# Patient Record
Sex: Female | Born: 1965 | Race: Black or African American | Hispanic: No | Marital: Single | Smoking: Current every day smoker
Health system: Southern US, Community
[De-identification: ages and names within clinical notes are randomized; demographics above are authoritative.]

## PROBLEM LIST (undated history)

## (undated) DIAGNOSIS — N390 Urinary tract infection, site not specified: Secondary | ICD-10-CM

## (undated) DIAGNOSIS — G8929 Other chronic pain: Secondary | ICD-10-CM

## (undated) DIAGNOSIS — J189 Pneumonia, unspecified organism: Secondary | ICD-10-CM

## (undated) DIAGNOSIS — J40 Bronchitis, not specified as acute or chronic: Secondary | ICD-10-CM

## (undated) DIAGNOSIS — M549 Dorsalgia, unspecified: Secondary | ICD-10-CM

## (undated) DIAGNOSIS — K219 Gastro-esophageal reflux disease without esophagitis: Secondary | ICD-10-CM

## (undated) DIAGNOSIS — I10 Essential (primary) hypertension: Secondary | ICD-10-CM

## (undated) DIAGNOSIS — M1990 Unspecified osteoarthritis, unspecified site: Secondary | ICD-10-CM

## (undated) DIAGNOSIS — E079 Disorder of thyroid, unspecified: Secondary | ICD-10-CM

## (undated) DIAGNOSIS — S82892A Other fracture of left lower leg, initial encounter for closed fracture: Secondary | ICD-10-CM

## (undated) DIAGNOSIS — M419 Scoliosis, unspecified: Secondary | ICD-10-CM

## (undated) DIAGNOSIS — F4310 Post-traumatic stress disorder, unspecified: Secondary | ICD-10-CM

## (undated) DIAGNOSIS — R569 Unspecified convulsions: Secondary | ICD-10-CM

## (undated) DIAGNOSIS — R7303 Prediabetes: Secondary | ICD-10-CM

## (undated) DIAGNOSIS — IMO0001 Reserved for inherently not codable concepts without codable children: Secondary | ICD-10-CM

## (undated) DIAGNOSIS — F3130 Bipolar disorder, current episode depressed, mild or moderate severity, unspecified: Secondary | ICD-10-CM

## (undated) DIAGNOSIS — F1910 Other psychoactive substance abuse, uncomplicated: Secondary | ICD-10-CM

## (undated) DIAGNOSIS — J449 Chronic obstructive pulmonary disease, unspecified: Secondary | ICD-10-CM

## (undated) DIAGNOSIS — J45909 Unspecified asthma, uncomplicated: Secondary | ICD-10-CM

## (undated) HISTORY — PX: FRACTURE SURGERY: SHX138

## (undated) HISTORY — PX: BACK SURGERY: SHX140

## (undated) HISTORY — PX: NECK SURGERY: SHX720

## (undated) HISTORY — PX: JOINT REPLACEMENT: SHX530

---

## 2011-08-13 DIAGNOSIS — Z8719 Personal history of other diseases of the digestive system: Secondary | ICD-10-CM | POA: Insufficient documentation

## 2011-08-13 DIAGNOSIS — R739 Hyperglycemia, unspecified: Secondary | ICD-10-CM | POA: Insufficient documentation

## 2011-08-13 DIAGNOSIS — F4310 Post-traumatic stress disorder, unspecified: Secondary | ICD-10-CM | POA: Insufficient documentation

## 2011-08-13 DIAGNOSIS — J45909 Unspecified asthma, uncomplicated: Secondary | ICD-10-CM | POA: Insufficient documentation

## 2012-11-12 DIAGNOSIS — S82122D Displaced fracture of lateral condyle of left tibia, subsequent encounter for closed fracture with routine healing: Secondary | ICD-10-CM | POA: Insufficient documentation

## 2013-05-02 ENCOUNTER — Emergency Department (HOSPITAL_COMMUNITY)
Admission: EM | Admit: 2013-05-02 | Discharge: 2013-05-02 | Disposition: A | Discharge status: Home / Self Care | Payer: Self-pay | Attending: Emergency Medicine | Admitting: Emergency Medicine

## 2013-05-02 ENCOUNTER — Encounter (HOSPITAL_COMMUNITY): Payer: Self-pay | Admitting: Emergency Medicine

## 2013-05-02 DIAGNOSIS — Y9389 Activity, other specified: Secondary | ICD-10-CM | POA: Insufficient documentation

## 2013-05-02 DIAGNOSIS — S4980XA Other specified injuries of shoulder and upper arm, unspecified arm, initial encounter: Secondary | ICD-10-CM | POA: Insufficient documentation

## 2013-05-02 DIAGNOSIS — F17200 Nicotine dependence, unspecified, uncomplicated: Secondary | ICD-10-CM | POA: Insufficient documentation

## 2013-05-02 DIAGNOSIS — Z9889 Other specified postprocedural states: Secondary | ICD-10-CM | POA: Insufficient documentation

## 2013-05-02 DIAGNOSIS — I10 Essential (primary) hypertension: Secondary | ICD-10-CM | POA: Insufficient documentation

## 2013-05-02 DIAGNOSIS — K219 Gastro-esophageal reflux disease without esophagitis: Secondary | ICD-10-CM | POA: Insufficient documentation

## 2013-05-02 DIAGNOSIS — G8929 Other chronic pain: Secondary | ICD-10-CM | POA: Insufficient documentation

## 2013-05-02 DIAGNOSIS — R109 Unspecified abdominal pain: Secondary | ICD-10-CM | POA: Insufficient documentation

## 2013-05-02 DIAGNOSIS — Z8742 Personal history of other diseases of the female genital tract: Secondary | ICD-10-CM | POA: Insufficient documentation

## 2013-05-02 DIAGNOSIS — R296 Repeated falls: Secondary | ICD-10-CM | POA: Insufficient documentation

## 2013-05-02 DIAGNOSIS — J45909 Unspecified asthma, uncomplicated: Secondary | ICD-10-CM | POA: Insufficient documentation

## 2013-05-02 DIAGNOSIS — Z8639 Personal history of other endocrine, nutritional and metabolic disease: Secondary | ICD-10-CM | POA: Insufficient documentation

## 2013-05-02 DIAGNOSIS — Z79899 Other long term (current) drug therapy: Secondary | ICD-10-CM | POA: Insufficient documentation

## 2013-05-02 DIAGNOSIS — X503XXA Overexertion from repetitive movements, initial encounter: Secondary | ICD-10-CM | POA: Insufficient documentation

## 2013-05-02 DIAGNOSIS — S46909A Unspecified injury of unspecified muscle, fascia and tendon at shoulder and upper arm level, unspecified arm, initial encounter: Secondary | ICD-10-CM | POA: Insufficient documentation

## 2013-05-02 DIAGNOSIS — F4310 Post-traumatic stress disorder, unspecified: Secondary | ICD-10-CM | POA: Insufficient documentation

## 2013-05-02 DIAGNOSIS — Y929 Unspecified place or not applicable: Secondary | ICD-10-CM | POA: Insufficient documentation

## 2013-05-02 DIAGNOSIS — Z862 Personal history of diseases of the blood and blood-forming organs and certain disorders involving the immune mechanism: Secondary | ICD-10-CM | POA: Insufficient documentation

## 2013-05-02 DIAGNOSIS — M25579 Pain in unspecified ankle and joints of unspecified foot: Secondary | ICD-10-CM | POA: Insufficient documentation

## 2013-05-02 DIAGNOSIS — F3130 Bipolar disorder, current episode depressed, mild or moderate severity, unspecified: Secondary | ICD-10-CM | POA: Insufficient documentation

## 2013-05-02 DIAGNOSIS — S335XXA Sprain of ligaments of lumbar spine, initial encounter: Secondary | ICD-10-CM | POA: Insufficient documentation

## 2013-05-02 DIAGNOSIS — G8918 Other acute postprocedural pain: Secondary | ICD-10-CM | POA: Insufficient documentation

## 2013-05-02 DIAGNOSIS — S39012A Strain of muscle, fascia and tendon of lower back, initial encounter: Secondary | ICD-10-CM

## 2013-05-02 HISTORY — DX: Bronchitis, not specified as acute or chronic: J40

## 2013-05-02 HISTORY — DX: Post-traumatic stress disorder, unspecified: F43.10

## 2013-05-02 HISTORY — DX: Disorder of thyroid, unspecified: E07.9

## 2013-05-02 HISTORY — DX: Unspecified asthma, uncomplicated: J45.909

## 2013-05-02 HISTORY — DX: Gastro-esophageal reflux disease without esophagitis: K21.9

## 2013-05-02 HISTORY — DX: Dorsalgia, unspecified: M54.9

## 2013-05-02 HISTORY — DX: Bipolar disorder, current episode depressed, mild or moderate severity, unspecified: F31.30

## 2013-05-02 HISTORY — DX: Essential (primary) hypertension: I10

## 2013-05-02 HISTORY — DX: Other chronic pain: G89.29

## 2013-05-02 LAB — URINALYSIS, ROUTINE W REFLEX MICROSCOPIC
Bilirubin Urine: NEGATIVE
Glucose, UA: NEGATIVE mg/dL
Hgb urine dipstick: NEGATIVE
Ketones, ur: 15 mg/dL — AB
Leukocytes, UA: NEGATIVE
Nitrite: NEGATIVE
Protein, ur: NEGATIVE mg/dL
Specific Gravity, Urine: 1.024 (ref 1.005–1.030)
Urobilinogen, UA: 1 mg/dL (ref 0.0–1.0)
pH: 7 (ref 5.0–8.0)

## 2013-05-02 MED ORDER — PREDNISONE 50 MG PO TABS: 50.0000 mg | ORAL_TABLET | Freq: Every day | ORAL | Status: DC

## 2013-05-02 MED ORDER — KETOROLAC TROMETHAMINE 60 MG/2ML IM SOLN
60.0000 mg | Freq: Once | INTRAMUSCULAR | Status: AC
  Administered 2013-05-02: 60 mg via INTRAMUSCULAR
  Filled 2013-05-02: qty 2

## 2013-05-02 MED ORDER — MORPHINE SULFATE 4 MG/ML IJ SOLN
6.0000 mg | Freq: Once | INTRAMUSCULAR | Status: AC
  Administered 2013-05-02: 6 mg via INTRAMUSCULAR
  Filled 2013-05-02: qty 2

## 2013-05-02 MED ORDER — HYDROCODONE-ACETAMINOPHEN 5-325 MG PO TABS: 1.0000 | ORAL_TABLET | Freq: Four times a day (QID) | ORAL | Status: DC | PRN

## 2013-05-02 MED ORDER — DEXAMETHASONE SODIUM PHOSPHATE 10 MG/ML IJ SOLN
10.0000 mg | Freq: Once | INTRAMUSCULAR | Status: AC
  Administered 2013-05-02: 10 mg via INTRAMUSCULAR
  Filled 2013-05-02: qty 1

## 2013-05-02 MED ORDER — CYCLOBENZAPRINE HCL 10 MG PO TABS: 10.0000 mg | ORAL_TABLET | Freq: Three times a day (TID) | ORAL | Status: DC | PRN

## 2013-05-02 NOTE — ED Notes (Signed)
Pt reports 2 weeks of multiple complaints: Low abdominal pain, low back pain, shoulder pain, left ankle pain and left knee pain. Pt fractured her left ankle Oct 2013 and has a stimulator. Pt fell twice today. Pt also reports shortness of breath and wheezing. Pt able to talk in complete sentences without difficulty.

## 2013-05-02 NOTE — ED Provider Notes (Signed)
History    CSN: [id number] Arrival date & time 05/02/13  1133  First MD Initiated Contact with Patient 05/02/13 1237     Chief Complaint  Patient presents with  . Back Pain  . Ankle Pain  . Fall  . Abdominal Pain  . Shoulder Pain   (Consider location/radiation/quality/duration/timing/severity/associated sxs/prior Treatment) HPI Patient presents to the emergency department with lower back pain.  The patient also had complaints of tissues of left ankle pain from her previous surgery and wanting her fibroids checked. The patient states that she was lifting some items and then her lower back began to hurt. The patient denies chest pain, SOB, weakness, numbness,  Headache, blurred vision, diarrhea, dysuria, fever, cough, abdominal pain, or syncope.  Patient, states she did not take anything prior to arrival for her symptoms.  Patient, states, that movement makes her pain, worse. Past Medical History  Diagnosis Date  . Hypertension   . Asthma   . Bronchitis   . GERD (gastroesophageal reflux disease)   . Thyroid disease   . PTSD (post-traumatic stress disorder)   . Bipolar affect, depressed   . Chronic back pain    Past Surgical History  Procedure Laterality Date  . Implant of bone stimulator    . Neck surgery    . Abdominal surgery     No family history on file. History  Substance Use Topics  . Smoking status: Current Every Day Smoker  . Smokeless tobacco: Not on file  . Alcohol Use: No   OB History   Grav Para Term Preterm Abortions TAB SAB Ect Mult Living                 Review of Systems All other systems negative except as documented in the HPI. All pertinent positives and negatives as reviewed in the HPI. Allergies  Crab  Home Medications   Current Outpatient Rx  Name  Route  Sig  Dispense  Refill  . albuterol (PROVENTIL HFA;VENTOLIN HFA) 108 (90 BASE) MCG/ACT inhaler   Inhalation   Inhale 2 puffs into the lungs every 6 (six) hours as needed for wheezing.        Marland Kitchen albuterol (PROVENTIL) (2.5 MG/3ML) 0.083% nebulizer solution   Nebulization   Take 2.5 mg by nebulization every 6 (six) hours as needed for wheezing.         . fluticasone (FLONASE) 50 MCG/ACT nasal spray   Nasal   Place 2 sprays into the nose daily.         . Fluticasone-Salmeterol (ADVAIR) 250-50 MCG/DOSE AEPB   Inhalation   Inhale 1 puff into the lungs every 12 (twelve) hours.         . hydrOXYzine (VISTARIL) 25 MG capsule   Oral   Take 25 mg by mouth 3 (three) times daily as needed for itching.         Marland Kitchen OVER THE COUNTER MEDICATION   Oral   Take 1 tablet by mouth daily.         Marland Kitchen PARoxetine (PAXIL) 20 MG tablet   Oral   Take 20 mg by mouth every morning.         Marland Kitchen QUEtiapine Fumarate (SEROQUEL XR) 150 MG 24 hr tablet   Oral   Take 150 mg by mouth at bedtime.         . ranitidine (ZANTAC) 150 MG tablet   Oral   Take 150 mg by mouth 2 (two) times daily.         Marland Kitchen  tiotropium (SPIRIVA) 18 MCG inhalation capsule   Inhalation   Place 18 mcg into inhaler and inhale daily.          BP 136/80  Pulse 110  Temp(Src) 98.6 F (37 C) (Oral)  Resp 22  SpO2 95%  LMP 04/25/2013 Physical Exam  Nursing note and vitals reviewed. Constitutional: She is oriented to person, place, and time. She appears well-developed and well-nourished. No distress.  HENT:  Head: Normocephalic and atraumatic.  Mouth/Throat: Oropharynx is clear and moist.  Eyes: Pupils are equal, round, and reactive to light.  Neck: Normal range of motion. Neck supple.  Cardiovascular: Normal rate, regular rhythm and normal heart sounds.   Pulmonary/Chest: Effort normal and breath sounds normal.  Abdominal: Soft. Bowel sounds are normal. She exhibits no distension. There is no tenderness.  Musculoskeletal:       Lumbar back: She exhibits tenderness and pain. She exhibits normal range of motion, no bony tenderness, no deformity and no spasm.       Back:  Neurological: She is alert  and oriented to person, place, and time. She has normal reflexes. She exhibits normal muscle tone. Coordination normal.  Skin: Skin is warm and dry.    ED Course  Procedures (including critical care time) Labs Reviewed  URINALYSIS, ROUTINE W REFLEX MICROSCOPIC - Abnormal; Notable for the following:    Ketones, ur 15 (*)    All other components within normal limits   advised the patient, that she'll need followup on her other complaints.  Patient is advised to return here as needed.  Patient has no neurological or motor deficits on exam and normal reflexes. MDM    Carlyle Dolly, PA-C 05/06/13 [phone]

## 2013-05-09 NOTE — ED Provider Notes (Signed)
Medical screening examination/treatment/procedure(s) were performed by non-physician practitioner and as supervising physician I was immediately available for consultation/collaboration.   Gwyneth Sprout, MD 05/09/13 1601

## 2013-05-12 ENCOUNTER — Encounter (HOSPITAL_COMMUNITY): Payer: Self-pay | Admitting: Nurse Practitioner

## 2013-05-12 ENCOUNTER — Emergency Department (HOSPITAL_COMMUNITY)
Admission: EM | Admit: 2013-05-12 | Discharge: 2013-05-12 | Disposition: A | Discharge status: Home / Self Care | Payer: Self-pay | Attending: Emergency Medicine | Admitting: Emergency Medicine

## 2013-05-12 ENCOUNTER — Emergency Department (HOSPITAL_COMMUNITY): Payer: Self-pay

## 2013-05-12 DIAGNOSIS — E079 Disorder of thyroid, unspecified: Secondary | ICD-10-CM | POA: Insufficient documentation

## 2013-05-12 DIAGNOSIS — I10 Essential (primary) hypertension: Secondary | ICD-10-CM | POA: Insufficient documentation

## 2013-05-12 DIAGNOSIS — J40 Bronchitis, not specified as acute or chronic: Secondary | ICD-10-CM | POA: Insufficient documentation

## 2013-05-12 DIAGNOSIS — M25569 Pain in unspecified knee: Secondary | ICD-10-CM | POA: Insufficient documentation

## 2013-05-12 DIAGNOSIS — M62838 Other muscle spasm: Secondary | ICD-10-CM | POA: Insufficient documentation

## 2013-05-12 DIAGNOSIS — Z79899 Other long term (current) drug therapy: Secondary | ICD-10-CM | POA: Insufficient documentation

## 2013-05-12 DIAGNOSIS — F17200 Nicotine dependence, unspecified, uncomplicated: Secondary | ICD-10-CM | POA: Insufficient documentation

## 2013-05-12 DIAGNOSIS — R05 Cough: Secondary | ICD-10-CM | POA: Insufficient documentation

## 2013-05-12 DIAGNOSIS — M25579 Pain in unspecified ankle and joints of unspecified foot: Secondary | ICD-10-CM | POA: Insufficient documentation

## 2013-05-12 DIAGNOSIS — F3130 Bipolar disorder, current episode depressed, mild or moderate severity, unspecified: Secondary | ICD-10-CM | POA: Insufficient documentation

## 2013-05-12 DIAGNOSIS — R059 Cough, unspecified: Secondary | ICD-10-CM | POA: Insufficient documentation

## 2013-05-12 DIAGNOSIS — G8929 Other chronic pain: Secondary | ICD-10-CM | POA: Insufficient documentation

## 2013-05-12 DIAGNOSIS — M25562 Pain in left knee: Secondary | ICD-10-CM

## 2013-05-12 DIAGNOSIS — J45909 Unspecified asthma, uncomplicated: Secondary | ICD-10-CM | POA: Insufficient documentation

## 2013-05-12 DIAGNOSIS — M545 Low back pain, unspecified: Secondary | ICD-10-CM | POA: Insufficient documentation

## 2013-05-12 DIAGNOSIS — K219 Gastro-esophageal reflux disease without esophagitis: Secondary | ICD-10-CM | POA: Insufficient documentation

## 2013-05-12 DIAGNOSIS — F4310 Post-traumatic stress disorder, unspecified: Secondary | ICD-10-CM | POA: Insufficient documentation

## 2013-05-12 LAB — BASIC METABOLIC PANEL
BUN: 10 mg/dL (ref 6–23)
CO2: 24 mEq/L (ref 19–32)
Chloride: 97 mEq/L (ref 96–112)
Creatinine, Ser: 0.91 mg/dL (ref 0.50–1.10)

## 2013-05-12 LAB — CBC
HCT: 35.1 % — ABNORMAL LOW (ref 36.0–46.0)
MCH: 29.9 pg (ref 26.0–34.0)
MCV: 87.3 fL (ref 78.0–100.0)
RBC: 4.02 MIL/uL (ref 3.87–5.11)
WBC: 11 10*3/uL — ABNORMAL HIGH (ref 4.0–10.5)

## 2013-05-12 MED ORDER — HYDROCODONE-ACETAMINOPHEN 5-325 MG PO TABS: 1.0000 | ORAL_TABLET | Freq: Four times a day (QID) | ORAL | Status: DC | PRN

## 2013-05-12 MED ORDER — GI COCKTAIL ~~LOC~~
30.0000 mL | Freq: Once | ORAL | Status: AC
  Administered 2013-05-12: 30 mL via ORAL
  Filled 2013-05-12: qty 30

## 2013-05-12 MED ORDER — METHOCARBAMOL 500 MG PO TABS: 500.0000 mg | ORAL_TABLET | Freq: Two times a day (BID) | ORAL | Status: DC

## 2013-05-12 MED ORDER — OMEPRAZOLE 20 MG PO CPDR: 20.0000 mg | DELAYED_RELEASE_CAPSULE | Freq: Every day | ORAL | Status: DC

## 2013-05-12 NOTE — Care Management Note (Signed)
    Page 1 of 1   05/12/2013     3:44:31 PM   CARE MANAGEMENT NOTE 05/12/2013  Patient:  Alicia Blake, Alicia Blake   Account Number:  [id number]  Date Initiated:  05/12/2013  Documentation initiated by:  Skylynne Schlechter  Subjective/Objective Assessment:     Action/Plan:   Anticipated DC Date:     Anticipated DC Plan:           Choice offered to / List presented to:             Status of service:   Medicare Important Message given?   (If response is "NO", the following Medicare IM given date fields will be blank) Date Medicare IM given:   Date Additional Medicare IM given:    Discharge Disposition:    Per UR Regulation:    If discussed at Long Length of Stay Meetings, dates discussed:    Comments:  Asked to speak to patient regarding medication assistance. After speaking to the patient and her understanding the MATCH progam is a once per calandar year assistance she chose to pay out-of-pocket for her medications.

## 2013-05-12 NOTE — ED Notes (Signed)
Pt has multiple complaints, nothing new, all seen by PCP before for these issues. Pt c/o sob, left lower back pain that radiates into left posterior thigh down to leg and sometimes gives her cramps in leg, c/o feeling bloated, sts she has "really bad" GERD which is also giving her issues, also c/o swelling in throat when every morning she wakes up and thinks its her thyroid because her mother has thyroid issues. Sts she is frustrated because she went to her PCP for all these complaints but he didn't seem to do much for her, sts they did draw bld and was told they all looked normal, but is concerned about her potassium because one time she went to the hospital they checked it and she was told it was low. Pt in nad, skin warm and dry, resp e/u. No obvious injury or swelling seen to left side of body where pt is c/o of pain.

## 2013-05-12 NOTE — ED Notes (Addendum)
Pt with multiple complaints. C/o SOB, cp , "bloated abd," abd pain, back pain radiating into L knee, sore throat. A&Ox4, resp e/u

## 2013-05-12 NOTE — ED Notes (Signed)
Pt waiting on Case management for help with getting medications filled.

## 2013-05-12 NOTE — ED Notes (Signed)
Pt returned from radiology. Ambulated to restroom with no issues. 

## 2013-05-12 NOTE — ED Provider Notes (Signed)
History    CSN: [id number] Arrival date & time 05/12/13  1255  First MD Initiated Contact with Patient 05/12/13 1318     Chief Complaint  Patient presents with  . Shortness of Breath   (Consider location/radiation/quality/duration/timing/severity/associated sxs/prior Treatment) HPI Comments: Patient presents today with multiple complaints.  She states that she has had lower back pain for the past month.  Pain located across her lower back and does not radiate.  She denies any acute injury or trauma.  She states that it feels like the back "tightens up."  She also states that it feels like muscle spasms.  She has taken Flexeril and Norco for the pain, but does not feel that it helps.  She denies numbness or tingling.  Denies fever or chills.  Denies bowel or bladder incontinence.  She is also complaining of a burning pain in the epigastric area of her stomach, which radiates into her chest.  She reports that the pain feels similar to GERD.  Pain has been present intermittently for months.  She is currently not taking anything for her symptoms.  She is also complaining of productive cough for the past three days that is gradually worsening.  Pain associated with occasional SOB.  She has been using her Albuterol inhaler, which is helping.  She is also complaining of pain in her left knee.  Pain has been present for years.  She has been told in the past that the pain is secondary to Arthritis.  She denies acute injury or trauma.  Denies redness or swelling of the knee.   The history is provided by the patient.   Past Medical History  Diagnosis Date  . Hypertension   . Asthma   . Bronchitis   . GERD (gastroesophageal reflux disease)   . Thyroid disease   . PTSD (post-traumatic stress disorder)   . Bipolar affect, depressed   . Chronic back pain    Past Surgical History  Procedure Laterality Date  . Implant of bone stimulator    . Neck surgery    . Abdominal surgery     History reviewed.  No pertinent family history. History  Substance Use Topics  . Smoking status: Current Every Day Smoker  . Smokeless tobacco: Not on file  . Alcohol Use: No   OB History   Grav Para Term Preterm Abortions TAB SAB Ect Mult Living                 Review of Systems  All other systems reviewed and are negative.    Allergies  Crab  Home Medications   Current Outpatient Rx  Name  Route  Sig  Dispense  Refill  . albuterol (PROVENTIL HFA;VENTOLIN HFA) 108 (90 BASE) MCG/ACT inhaler   Inhalation   Inhale 2 puffs into the lungs every 6 (six) hours as needed for wheezing.         Marland Kitchen albuterol (PROVENTIL) (2.5 MG/3ML) 0.083% nebulizer solution   Nebulization   Take 2.5 mg by nebulization every 6 (six) hours as needed for wheezing.         . divalproex (DEPAKOTE) 125 MG DR tablet   Oral   Take 125 mg by mouth 2 (two) times daily. 8 am and 12 noon         . fluticasone (FLONASE) 50 MCG/ACT nasal spray   Nasal   Place 2 sprays into the nose daily.         . hydrOXYzine (VISTARIL) 25 MG capsule  Oral   Take 25 mg by mouth 3 (three) times daily as needed for anxiety.          Marland Kitchen PARoxetine (PAXIL) 20 MG tablet   Oral   Take 20 mg by mouth every morning.         Marland Kitchen QUEtiapine Fumarate (SEROQUEL XR) 150 MG 24 hr tablet   Oral   Take 150 mg by mouth at bedtime.         . ranitidine (ZANTAC) 150 MG tablet   Oral   Take 150 mg by mouth 2 (two) times daily as needed for heartburn.          . tiotropium (SPIRIVA) 18 MCG inhalation capsule   Inhalation   Place 18 mcg into inhaler and inhale daily.          BP 108/74  Pulse 108  Temp(Src) 98.9 F (37.2 C) (Oral)  Resp 19  Ht 5\' 6"  (1.676 m)  Wt 216 lb (97.977 kg)  BMI 34.88 kg/m2  SpO2 100%  LMP 04/25/2013 Physical Exam  Nursing note and vitals reviewed. Constitutional: She appears well-developed and well-nourished.  HENT:  Head: Normocephalic and atraumatic. No trismus in the jaw.  Mouth/Throat: Uvula  is midline, oropharynx is clear and moist and mucous membranes are normal. No oropharyngeal exudate, posterior oropharyngeal edema or posterior oropharyngeal erythema.  Neck: Normal range of motion. Neck supple.  Cardiovascular: Normal rate, regular rhythm and normal heart sounds.   Pulmonary/Chest: Effort normal and breath sounds normal.  Abdominal: Normal appearance and bowel sounds are normal. There is tenderness in the epigastric area.  Musculoskeletal:       Left knee: She exhibits normal range of motion, no swelling, no deformity and no erythema. No tenderness found.       Left ankle: She exhibits normal range of motion, no swelling and no deformity. No tenderness.       Arms: Tenderness to palpation across lower back.  Neurological: She is alert. She has normal strength. No sensory deficit. Gait normal.  Skin: Skin is warm and dry.  Psychiatric: She has a normal mood and affect.    ED Course  Procedures (including critical care time) Labs Reviewed  CBC - Abnormal; Notable for the following:    WBC 11.0 (*)    HCT 35.1 (*)    All other components within normal limits  BASIC METABOLIC PANEL - Abnormal; Notable for the following:    Sodium 132 (*)    Potassium 3.4 (*)    Glucose, Bld 143 (*)    GFR calc non Af Amer 73 (*)    GFR calc Af Amer 85 (*)    All other components within normal limits  PRO B NATRIURETIC PEPTIDE  POCT I-STAT TROPONIN I   Dg Chest 2 View  05/12/2013   *RADIOLOGY REPORT*  Clinical Data: Short of breath.  Back pain.  Former smoker  CHEST - 2 VIEW  Comparison: None  Findings: Heart size appears normal.  No pleural effusion or edema.  No airspace consolidation identified.  Coarsened interstitial markings are noted bilaterally.  The visualized osseous structures appear intact.  IMPRESSION:  1.  No acute cardiopulmonary abnormalities.   Original Report Authenticated By: Signa Kell, M.D.   No diagnosis found.  MDM  Patient presents with multiple chronic  complaints.  Patient with back pain.  No neurological deficits and normal neuro exam.  Patient can walk but states is painful.  No loss of bowel or bladder control.  No concern for cauda equina.  No fever, night sweats, weight loss, h/o cancer, IVDU.  Patient also complaining of epigastric pain radiating into her chest for the past several months, which she states is the pain that she typically has with GERD.  Pain improved after given GI cocktail.  CXR and troponin negative.  Patient also complaining of knee pain.  Known history of OA of the knee.  No acute changes in pain or injury of the knee.  No signs of infection.  Patient also with ankle pain, which is also chronic.  Patient instructed to follow up with PCP regarding her chronic issues.  Pascal Lux Zion, PA-C 05/12/13 2034

## 2013-05-13 NOTE — ED Provider Notes (Signed)
  Medical screening examination/treatment/procedure(s) were performed by non-physician practitioner and as supervising physician I was immediately available for consultation/collaboration.   Gerhard Munch, MD 05/13/13 [phone]

## 2013-06-17 ENCOUNTER — Emergency Department (HOSPITAL_COMMUNITY)
Admission: EM | Admit: 2013-06-17 | Discharge: 2013-06-17 | Disposition: A | Discharge status: Home / Self Care | Payer: Self-pay | Attending: Emergency Medicine | Admitting: Emergency Medicine

## 2013-06-17 ENCOUNTER — Encounter (HOSPITAL_COMMUNITY): Payer: Self-pay | Admitting: *Deleted

## 2013-06-17 DIAGNOSIS — G8929 Other chronic pain: Secondary | ICD-10-CM

## 2013-06-17 DIAGNOSIS — F4310 Post-traumatic stress disorder, unspecified: Secondary | ICD-10-CM | POA: Insufficient documentation

## 2013-06-17 DIAGNOSIS — I10 Essential (primary) hypertension: Secondary | ICD-10-CM | POA: Insufficient documentation

## 2013-06-17 DIAGNOSIS — Z8639 Personal history of other endocrine, nutritional and metabolic disease: Secondary | ICD-10-CM | POA: Insufficient documentation

## 2013-06-17 DIAGNOSIS — J45909 Unspecified asthma, uncomplicated: Secondary | ICD-10-CM | POA: Insufficient documentation

## 2013-06-17 DIAGNOSIS — F3130 Bipolar disorder, current episode depressed, mild or moderate severity, unspecified: Secondary | ICD-10-CM | POA: Insufficient documentation

## 2013-06-17 DIAGNOSIS — R079 Chest pain, unspecified: Secondary | ICD-10-CM | POA: Insufficient documentation

## 2013-06-17 DIAGNOSIS — Z79899 Other long term (current) drug therapy: Secondary | ICD-10-CM | POA: Insufficient documentation

## 2013-06-17 DIAGNOSIS — Z862 Personal history of diseases of the blood and blood-forming organs and certain disorders involving the immune mechanism: Secondary | ICD-10-CM | POA: Insufficient documentation

## 2013-06-17 DIAGNOSIS — M545 Low back pain, unspecified: Secondary | ICD-10-CM

## 2013-06-17 DIAGNOSIS — M25579 Pain in unspecified ankle and joints of unspecified foot: Secondary | ICD-10-CM | POA: Insufficient documentation

## 2013-06-17 DIAGNOSIS — K219 Gastro-esophageal reflux disease without esophagitis: Secondary | ICD-10-CM | POA: Insufficient documentation

## 2013-06-17 DIAGNOSIS — R1013 Epigastric pain: Secondary | ICD-10-CM

## 2013-06-17 DIAGNOSIS — F17200 Nicotine dependence, unspecified, uncomplicated: Secondary | ICD-10-CM | POA: Insufficient documentation

## 2013-06-17 LAB — POCT I-STAT TROPONIN I: Troponin i, poc: 0.02 ng/mL (ref 0.00–0.08)

## 2013-06-17 LAB — POCT I-STAT, CHEM 8
BUN: 7 mg/dL (ref 6–23)
Calcium, Ion: 1.18 mmol/L (ref 1.12–1.23)
Chloride: 103 mEq/L (ref 96–112)

## 2013-06-17 MED ORDER — PANTOPRAZOLE SODIUM 20 MG PO TBEC: 20.0000 mg | DELAYED_RELEASE_TABLET | Freq: Two times a day (BID) | ORAL | Status: DC

## 2013-06-17 MED ORDER — DIAZEPAM 5 MG PO TABS
5.0000 mg | ORAL_TABLET | Freq: Once | ORAL | Status: AC
  Administered 2013-06-17: 5 mg via ORAL
  Filled 2013-06-17: qty 1

## 2013-06-17 MED ORDER — DIAZEPAM 5 MG PO TABS: 5.0000 mg | ORAL_TABLET | Freq: Three times a day (TID) | ORAL | Status: DC | PRN

## 2013-06-17 MED ORDER — NAPROXEN 375 MG PO TABS: 375.0000 mg | ORAL_TABLET | Freq: Two times a day (BID) | ORAL | Status: DC | PRN

## 2013-06-17 MED ORDER — HYDROMORPHONE HCL PF 1 MG/ML IJ SOLN
1.0000 mg | Freq: Once | INTRAMUSCULAR | Status: AC
  Administered 2013-06-17: 1 mg via INTRAMUSCULAR
  Filled 2013-06-17: qty 1

## 2013-06-17 MED ORDER — KETOROLAC TROMETHAMINE 15 MG/ML IJ SOLN
15.0000 mg | Freq: Once | INTRAMUSCULAR | Status: AC
  Administered 2013-06-17: 15 mg via INTRAMUSCULAR
  Filled 2013-06-17: qty 1

## 2013-06-17 MED ORDER — GI COCKTAIL ~~LOC~~
30.0000 mL | Freq: Once | ORAL | Status: AC
  Administered 2013-06-17: 30 mL via ORAL
  Filled 2013-06-17: qty 30

## 2013-06-17 MED ORDER — OXYCODONE-ACETAMINOPHEN 5-325 MG PO TABS: 1.0000 | ORAL_TABLET | ORAL | Status: DC | PRN

## 2013-06-17 NOTE — ED Provider Notes (Signed)
CSN: [id number]     Arrival date & time 06/17/13  1327 History    Alicia Blake with multiple complaints. Primarily back pain. Also L ankle pain. Reports fx around Halloween of last year. No surgery. Began having lower back pain shortly after which she attributes to "that big boot" she wore. Pain is in lower back and "feels like a knot all twined up." Waxes and wanes. Worse in last few days. Denies trauma. NO fever or chills. No urinary complaints. No numbness, tingling or loss of strength. Has tried various meds for it including robaxin, hydrocodone, flexeril, ibuprofen since onset. None seem to provide much relief. Also c/o epigastric/substernal burning. Attributes this to reflux. On ranitidine. Reports complaints. No sob, diaphoresis, dizziness or lightheadedness. Not exertional.    First MD Initiated Contact with Patient 06/17/13 1503     Chief Complaint  Patient presents with  . Ankle Pain  . Back Pain  . Chest Pain   (Consider location/radiation/quality/duration/timing/severity/associated sxs/prior Treatment) HPI  Past Medical History  Diagnosis Date  . Hypertension   . Asthma   . Bronchitis   . GERD (gastroesophageal reflux disease)   . Thyroid disease   . PTSD (post-traumatic stress disorder)   . Bipolar affect, depressed   . Chronic back pain    Past Surgical History  Procedure Laterality Date  . Implant of bone stimulator    . Neck surgery    . Abdominal surgery     No family history on file. History  Substance Use Topics  . Smoking status: Current Every Day Smoker  . Smokeless tobacco: Not on file  . Alcohol Use: No   OB History   Grav Para Term Preterm Abortions TAB SAB Ect Mult Living                 Review of Systems  All systems reviewed and negative, other than as noted in HPI.   Allergies  Crab  Home Medications   Current Outpatient Rx  Name  Route  Sig  Dispense  Refill  . albuterol (PROVENTIL HFA;VENTOLIN HFA) 108 (90 BASE) MCG/ACT inhaler  Inhalation   Inhale 2 puffs into the lungs every 6 (six) hours as needed for wheezing.         Marland Kitchen albuterol (PROVENTIL) (2.5 MG/3ML) 0.083% nebulizer solution   Nebulization   Take 2.5 mg by nebulization every 6 (six) hours as needed for wheezing.         . divalproex (DEPAKOTE) 125 MG DR tablet   Oral   Take 125 mg by mouth 2 (two) times daily. 8 am and 12 noon         . Fluticasone-Salmeterol (ADVAIR) 250-50 MCG/DOSE AEPB   Inhalation   Inhale 1 puff into the lungs daily.         . hydrOXYzine (VISTARIL) 25 MG capsule   Oral   Take 25 mg by mouth daily.          Marland Kitchen PARoxetine (PAXIL) 20 MG tablet   Oral   Take 20 mg by mouth daily.          . QUEtiapine Fumarate (SEROQUEL XR) 150 MG 24 hr tablet   Oral   Take 150 mg by mouth at bedtime.         . ranitidine (ZANTAC) 150 MG tablet   Oral   Take 150 mg by mouth 3 (three) times daily as needed for heartburn.          . tiotropium (SPIRIVA) 18  MCG inhalation capsule   Inhalation   Place 18 mcg into inhaler and inhale daily.          BP 120/78  Pulse 96  Temp(Src) 98.2 F (36.8 C) (Oral)  Resp 16  Ht 5\' 6"  (1.676 m)  Wt 220 lb (99.791 kg)  BMI 35.53 kg/m2  SpO2 97%  LMP 06/14/2013 Physical Exam  Nursing note and vitals reviewed. Constitutional: She appears well-developed and well-nourished. No distress.  Laying in bed. NAD. Obese.   HENT:  Head: Normocephalic and atraumatic.  Eyes: Conjunctivae are normal. Right eye exhibits no discharge. Left eye exhibits no discharge.  Neck: Neck supple.  Cardiovascular: Normal rate, regular rhythm and normal heart sounds.  Exam reveals no gallop and no friction rub.   No murmur heard. Pulmonary/Chest: Effort normal and breath sounds normal. No respiratory distress.  Abdominal: Soft. She exhibits no distension. There is no tenderness.  Musculoskeletal: She exhibits no edema and no tenderness.       Arms: L ankle in brace. No deformity. No bony tenderness.  NVI. Mild tender in lower back as depicted. No overlying skin changes.   Neurological: She is alert.  Strength 5/5 b/l LE. Patellar reflexes 1+. Non antalgic gait.   Skin: Skin is warm and dry.  Psychiatric: She has a normal mood and affect. Her behavior is normal. Thought content normal.    ED Course   Procedures (including critical care time)  EKG:  Rhythm: normal sinnus Vent. rate 98 BPM PR interval 136 ms QRS duration 80 ms QT/QTc 350/446 ms ST segments: normal  Labs Reviewed  POCT I-STAT, CHEM 8 - Abnormal; Notable for the following:    Glucose, Bld 110 (*)    All other components within normal limits  POCT I-STAT TROPONIN I   No results found. 1. Chronic pain   2. Lower back pain   3. Epigastric burning sensation     MDM  Alicia Blake with multiple complaints including back pain. Atraumatic. Non focal neuro exam. No blood thinning medications. No bladder/bowel incontinence or retention. Denies hx of IV drug use. Doubt cauda equina, spinal epidural abscess, spinal epidural hematoma, fracture, vertebral osteomyelitis/discitis or other potential emergent etiology. No indication for emergent imaging.  Plan symptomatic tx. Changed some of her meds in effort for better relief. Sounds like GERD symptoms poorly controlled as well on h2 blocker. Will try PPI. Discussed with pt that may take several days before see difference. Extremely low suspicion for ACS or other emergent etiology of any of her symptoms. Return precautions discussed. Outpt FU otherwise.   Raeford Razor, MD 06/17/13 [phone]

## 2013-06-17 NOTE — ED Notes (Addendum)
Pt c/o knot to lower back.  Denies injury.  Also c/o pain to L ankle.  Hx of L ankle fracture. Pt also c/o burning to esophagus after eating.

## 2013-06-17 NOTE — ED Notes (Signed)
Patient says she has been up all night with GERD and regurgitation.  The patient is complaining of epigastric central chest pain.  She also says he back is "twinned" up and it radiates to both her legs.  The patient says she has an appointment with the Orthopedic doctor.  She is here because she cannot stand the pain in her back and legs.

## 2013-06-17 NOTE — ED Notes (Signed)
Patient is alert and orientedx4.  Patient was explained discharge instructions and they understood them with no questions.  The patient is taking the bus home.

## 2013-06-24 ENCOUNTER — Other Ambulatory Visit: Payer: Self-pay | Admitting: *Deleted

## 2013-06-24 DIAGNOSIS — N63 Unspecified lump in unspecified breast: Secondary | ICD-10-CM

## 2013-06-24 DIAGNOSIS — N6452 Nipple discharge: Secondary | ICD-10-CM

## 2013-06-24 DIAGNOSIS — N644 Mastodynia: Secondary | ICD-10-CM

## 2013-06-29 ENCOUNTER — Encounter (HOSPITAL_COMMUNITY): Payer: Self-pay

## 2013-06-29 ENCOUNTER — Ambulatory Visit (HOSPITAL_COMMUNITY)
Admission: RE | Admit: 2013-06-29 | Discharge: 2013-06-29 | Disposition: A | Discharge status: Home / Self Care | Payer: Self-pay | Source: Ambulatory Visit | Attending: Obstetrics and Gynecology | Admitting: Obstetrics and Gynecology

## 2013-06-29 VITALS — BP 128/80 | Temp 98.5°F | Ht 66.0 in | Wt 230.0 lb

## 2013-06-29 DIAGNOSIS — Z01419 Encounter for gynecological examination (general) (routine) without abnormal findings: Secondary | ICD-10-CM

## 2013-06-29 DIAGNOSIS — N6452 Nipple discharge: Secondary | ICD-10-CM | POA: Insufficient documentation

## 2013-06-29 DIAGNOSIS — N898 Other specified noninflammatory disorders of vagina: Secondary | ICD-10-CM

## 2013-06-29 DIAGNOSIS — N6325 Unspecified lump in the left breast, overlapping quadrants: Secondary | ICD-10-CM | POA: Insufficient documentation

## 2013-06-29 NOTE — Progress Notes (Signed)
Complaints of bilateral milky nipple discharge that is greater left breast. Complaints of left breast lump x few months that is painful at times. Patient states the pain comes and goes rating at a 6-7 out of 10.  Pap Smear:    Pap smear completed today. Patients last Pap smear was 3 years ago at [place], [facility] and normal per patient. Per patient no history of an abnormal Pap smear. No Pap smear results in EPIC.  Physical exam: Breasts Breasts symmetrical. No skin abnormalities bilateral breasts. No nipple retraction bilateral breasts. Bilateral clear nipple discharge per patient happens spontaneously and when expressed. Unable to express enough discharge to send specimen to lab for evaluation. No lymphadenopathy. No lumps palpated right breast breast. Palpated a small pea sized lump within the left breast at 9 o'clock 10 cm from the nipple. Complaints of left inner breast tenderness on exam. Referred patient to the [facility] for diagnostic mammogram. Appointment scheduled for Wednesday, July 14, 2013 at 1445.           Pelvic/Bimanual   Ext Genitalia No lesions, no swelling and no discharge observed on external genitalia.         Vagina Vagina pink and normal texture. No lesions observed in vagina. Thick white cottage cheese appearing discharge observed in vagina. Wet prep completed.          Cervix Cervix is present. Cervix pink and of normal texture. Thick white discharge observed on cervix.     Uterus Uterus is present and palpable. Uterus in normal position and enlarged. Per patient has a history of fibroids.      Adnexae Bilateral ovaries present and unable to palpate. No tenderness on palpation.          Rectovaginal No rectal exam completed today since patient had no rectal complaints. No skin abnormalities observed on exam.

## 2013-06-29 NOTE — Patient Instructions (Signed)
Taught Alicia Blake how to perform BSE and gave educational materials to take home. Let her know [facility] will cover Pap smears every 3 years unless has a history of abnormal Pap smears. Referred patient to the [facility] for diagnostic mammogram. Appointment scheduled for Wednesday, July 14, 2013 at 1445. Patient aware of appointment and will be there. Let patient know will follow up with her within the next couple weeks with results for Pap smear and wet prep by phone. Told patient that will refer her for a follow up appointment at the [facility] for her history of fibroids and right ovarian cyst per patient. Told her either [place] or myself will call her back with appointment. Smoking Cessation discussed with patient. Alicia Blake verbalized understanding.  Sonnia Strong, Kathaleen Maser, RN 1:19 PM

## 2013-06-29 NOTE — Addendum Note (Signed)
Encounter addended by: Lurlean Horns, LPN on: 4/0/9811  3:20 PM<BR>     Documentation filed: Orders

## 2013-06-30 ENCOUNTER — Other Ambulatory Visit: Payer: Self-pay | Admitting: Obstetrics and Gynecology

## 2013-06-30 ENCOUNTER — Other Ambulatory Visit: Payer: Self-pay | Admitting: *Deleted

## 2013-06-30 DIAGNOSIS — B9689 Other specified bacterial agents as the cause of diseases classified elsewhere: Secondary | ICD-10-CM

## 2013-06-30 LAB — WET PREP, GENITAL
Trich, Wet Prep: NONE SEEN
Yeast Wet Prep HPF POC: NONE SEEN

## 2013-06-30 MED ORDER — METRONIDAZOLE 500 MG PO TABS: 500.0000 mg | ORAL_TABLET | Freq: Two times a day (BID) | ORAL | Status: DC

## 2013-07-01 ENCOUNTER — Telehealth (HOSPITAL_COMMUNITY): Payer: Self-pay | Admitting: *Deleted

## 2013-07-01 NOTE — Telephone Encounter (Signed)
Telephoned patient at home # and left message to return call to [facility] 

## 2013-07-02 ENCOUNTER — Ambulatory Visit: Payer: Self-pay

## 2013-07-02 ENCOUNTER — Telehealth (HOSPITAL_COMMUNITY): Payer: Self-pay | Admitting: *Deleted

## 2013-07-02 NOTE — Telephone Encounter (Signed)
Telephoned patient at home # and left message to return call to [facility] 

## 2013-07-14 ENCOUNTER — Ambulatory Visit
Admission: RE | Admit: 2013-07-14 | Discharge: 2013-07-14 | Disposition: A | Discharge status: Home / Self Care | Payer: No Typology Code available for payment source | Source: Ambulatory Visit | Attending: Obstetrics and Gynecology | Admitting: Obstetrics and Gynecology

## 2013-07-14 DIAGNOSIS — N644 Mastodynia: Secondary | ICD-10-CM

## 2013-07-14 DIAGNOSIS — N63 Unspecified lump in unspecified breast: Secondary | ICD-10-CM

## 2013-07-14 DIAGNOSIS — N6452 Nipple discharge: Secondary | ICD-10-CM

## 2013-07-15 ENCOUNTER — Telehealth: Payer: Self-pay | Admitting: Obstetrics and Gynecology

## 2013-07-15 NOTE — Telephone Encounter (Signed)
Patient requesting test results.   1700- called patient back; no answer, unable to leave message.

## 2013-07-19 ENCOUNTER — Telehealth (HOSPITAL_COMMUNITY): Payer: Self-pay | Admitting: *Deleted

## 2013-07-19 ENCOUNTER — Telehealth: Payer: Self-pay | Admitting: General Practice

## 2013-07-19 NOTE — Telephone Encounter (Signed)
Patient returned call. Advised patient of negative pap smear results. Next pap due in 3 years. Patient voiced understanding.

## 2013-07-19 NOTE — Telephone Encounter (Signed)
Patient called and left message stating she would like test results  

## 2013-07-19 NOTE — Telephone Encounter (Signed)
Encounter already opened. 

## 2013-07-20 NOTE — Telephone Encounter (Signed)
Pt received results from [place].

## 2013-07-28 ENCOUNTER — Other Ambulatory Visit: Payer: Self-pay | Admitting: *Deleted

## 2013-07-28 DIAGNOSIS — B9689 Other specified bacterial agents as the cause of diseases classified elsewhere: Secondary | ICD-10-CM

## 2013-07-28 MED ORDER — METRONIDAZOLE 500 MG PO TABS: 500.0000 mg | ORAL_TABLET | Freq: Two times a day (BID) | ORAL | Status: DC

## 2013-08-09 ENCOUNTER — Other Ambulatory Visit (HOSPITAL_COMMUNITY)
Admission: RE | Admit: 2013-08-09 | Discharge: 2013-08-09 | Disposition: A | Discharge status: Home / Self Care | Payer: No Typology Code available for payment source | Source: Ambulatory Visit | Attending: Obstetrics & Gynecology | Admitting: Obstetrics & Gynecology

## 2013-08-09 ENCOUNTER — Encounter: Payer: Self-pay | Admitting: Obstetrics & Gynecology

## 2013-08-09 ENCOUNTER — Ambulatory Visit (INDEPENDENT_AMBULATORY_CARE_PROVIDER_SITE_OTHER): Payer: Self-pay | Admitting: Obstetrics & Gynecology

## 2013-08-09 VITALS — BP 129/85 | HR 93 | Temp 98.7°F | Ht 66.0 in | Wt 228.0 lb

## 2013-08-09 DIAGNOSIS — N760 Acute vaginitis: Secondary | ICD-10-CM

## 2013-08-09 DIAGNOSIS — N920 Excessive and frequent menstruation with regular cycle: Secondary | ICD-10-CM | POA: Insufficient documentation

## 2013-08-09 DIAGNOSIS — D259 Leiomyoma of uterus, unspecified: Secondary | ICD-10-CM

## 2013-08-09 DIAGNOSIS — D219 Benign neoplasm of connective and other soft tissue, unspecified: Secondary | ICD-10-CM

## 2013-08-09 NOTE — Progress Notes (Signed)
GYNECOLOGY CLINIC ENCOUNTER NOTE  History:  47 y.o. G1P1001 here today for discussion about management of fibroids and menorrhagia. Fibroids are associated with pain and menorrhagia, denies anemia symptoms. Also reports having vaginitis episodes, was diagnosed with Trich and BV 2 months ago. Wants repeat wet prep today. Other STI screen done at [facility] was negative.  The following portions of the patient's history were reviewed and updated as appropriate: allergies, current medications, past family history, past medical history, past social history, past surgical history and problem list. Pap on 06/29/13 was  Normal.  Review of Systems:  Pertinent items are noted in HPI.  Objective:  Physical Exam BP 129/85  Pulse 93  Temp(Src) 98.7 F (37.1 C)  Ht 5\' 6"  (1.676 m)  Wt 228 lb (103.42 kg)  BMI 36.82 kg/m2  LMP 07/11/2013 Gen: NAD Abd: Soft, nontender and nondistended Pelvic: Normal appearing external genitalia; normal appearing vaginal mucosa and cervix.  White discharge seen, wet prep obtained.  Enlarged uterus, no other palpable masses, no uterine or adnexal tenderness  ENDOMETRIAL BIOPSY     The indications for endometrial biopsy were reviewed.   Risks of the biopsy including cramping, bleeding, infection, uterine perforation, inadequate specimen and need for additional procedures  were discussed. The patient states she understands and agrees to undergo procedure today. Consent was signed. Time out was performed. Urine HCG was negative. During the pelvic exam, the cervix was prepped with Betadine. A single-toothed tenaculum was placed on the anterior lip of the cervix to stabilize it. The 3 mm pipelle was introduced into the endometrial cavity without difficulty to a depth of 10 cm, and a moderate amount of tissue was obtained and sent to pathology. The instruments were removed from the patient's vagina. Minimal bleeding from the cervix was noted. The patient tolerated the procedure well.  Routine post-procedure instructions were given to the patient.   Assessment & Plan:  Follow up endometrial biopsy and wet prep and manage accordingly. Pelvic ultrasound ordered Bleeding precuations reviewed

## 2013-08-09 NOTE — Patient Instructions (Signed)
Uterine Fibroid A uterine fibroid is a growth (tumor) that occurs in a woman's uterus. This type of tumor is not cancerous and does not spread out of the uterus. A woman can have one or many fibroids, and the fiboid(s) can become quite large. A fibroid can vary in size, weight, and where it grows in the uterus. Most fibroids do not require medical treatment, but some can cause pain or heavy bleeding during and between periods. CAUSES  A fibroid is the result of a single uterine cell that keeps growing (unregulated), which is different than most cells in the human body. Most cells have a control mechanism that keeps them from reproducing without control.  SYMPTOMS   Bleeding.  Pelvic pain and pressure.  Bladder problems due to the size of the fibroid.  Infertility and miscarriages depending on the size and location of the fibroid. DIAGNOSIS  A diagnosis is made by physical exam. Your caregiver may feel the lumpy tumors during a pelvic exam. Important information regarding size, location, and number of tumors can be gained by having an ultrasound. It is rare that other tests, such as a CT scan or MRI, are needed. TREATMENT   Your caregiver may recommend watchful waiting. This involves getting the fibroid checked by your caregiver to see if the fibroids grow or shrink.   Hormonal treatment or an intrauterine device (IUD) may be prescribed.   Surgery may be needed to remove the fibroids (myomectomy) or the uterus (hysterectomy). This depends on your situation. When fibroids interfere with fertility and a woman wants to become pregnant, a caregiver may recommend having the fibroids removed.  HOME CARE INSTRUCTIONS  Home care depends on how you were treated. In general:   Keep all follow-up appointments with your caregiver.   Only take medicine as told by your caregiver. Do not take aspirin. It can cause bleeding.   If you have excessive periods and soak tampons or pads in a half hour or  less, contact your caregiver immediately. If your periods are troublesome but not so heavy, lie down with your feet raised slightly above your heart. Place cold packs on your lower abdomen.   If your periods are heavy, write down the number of pads or tampons you use per month. Bring this information to your caregiver.   Talk to your caregiver about taking iron pills.   Include green vegetables in your diet.   If you were prescribed a hormonal treatment, take the hormonal medicines as directed.   If you need surgery, ask your caregiver for information on your specific surgery.  SEEK IMMEDIATE MEDICAL CARE IF:  You have pelvic pain or cramps not controlled with medicines.   You have a sudden increase in pelvic pain.   You have an increase of bleeding between and during periods.   You feel lightheaded or have fainting episodes.  MAKE SURE YOU:  Understand these instructions.  Will watch your condition.  Will get help right away if you are not doing well or get worse. Document Released: 10/11/2000 Document Revised: 01/06/2012 Document Reviewed: 11/04/2011 [employer] Patient Information 2014 [employer].  

## 2013-08-10 LAB — WET PREP, GENITAL: Trich, Wet Prep: NONE SEEN

## 2013-08-13 ENCOUNTER — Telehealth (HOSPITAL_COMMUNITY): Payer: Self-pay | Admitting: *Deleted

## 2013-08-13 NOTE — Telephone Encounter (Signed)
Telephoned patient at home # and left message to return call to [facility] 

## 2013-08-16 ENCOUNTER — Telehealth: Payer: Self-pay | Admitting: *Deleted

## 2013-08-16 ENCOUNTER — Ambulatory Visit (HOSPITAL_COMMUNITY)
Admission: RE | Admit: 2013-08-16 | Discharge: 2013-08-16 | Disposition: A | Discharge status: Home / Self Care | Payer: No Typology Code available for payment source | Source: Ambulatory Visit | Attending: Obstetrics & Gynecology | Admitting: Obstetrics & Gynecology

## 2013-08-16 DIAGNOSIS — D219 Benign neoplasm of connective and other soft tissue, unspecified: Secondary | ICD-10-CM

## 2013-08-16 DIAGNOSIS — D251 Intramural leiomyoma of uterus: Secondary | ICD-10-CM | POA: Insufficient documentation

## 2013-08-16 DIAGNOSIS — N920 Excessive and frequent menstruation with regular cycle: Secondary | ICD-10-CM | POA: Insufficient documentation

## 2013-08-16 DIAGNOSIS — N854 Malposition of uterus: Secondary | ICD-10-CM | POA: Insufficient documentation

## 2013-08-16 NOTE — Telephone Encounter (Signed)
Called [facility] and left a message we are calling with some information from your provider- please call the clinic during office hours.

## 2013-08-16 NOTE — Telephone Encounter (Signed)
Message copied by Gerome Apley on Mon Aug 16, 2013  4:29 PM ------      Message from: Jaynie Collins A      Created: Wed Aug 11, 2013  4:53 PM       Benign endometrial biopsy. Please call to inform patient of results. ------

## 2013-08-18 ENCOUNTER — Encounter: Payer: Self-pay | Admitting: *Deleted

## 2013-08-24 NOTE — Telephone Encounter (Signed)
Called and spoke w/pt. I informed her of benign endometrial biopsy. I also informed her of [place] result showing small fibroids. She would like clinic appt with any doctor to discuss management of the fibroids. I explained that there are no appts available through the end of November. She will be called with an appt in December once the schedule is available. Pt voiced understanding of all information.   * Vanessa [place] was given pt info for appt wait list for December.

## 2013-09-01 ENCOUNTER — Emergency Department (HOSPITAL_COMMUNITY)
Admission: EM | Admit: 2013-09-01 | Discharge: 2013-09-01 | Disposition: A | Discharge status: Home / Self Care | Payer: Medicaid - Out of State | Attending: Emergency Medicine | Admitting: Emergency Medicine

## 2013-09-01 ENCOUNTER — Other Ambulatory Visit: Payer: Self-pay

## 2013-09-01 ENCOUNTER — Encounter (HOSPITAL_COMMUNITY): Payer: Self-pay | Admitting: Emergency Medicine

## 2013-09-01 DIAGNOSIS — F3130 Bipolar disorder, current episode depressed, mild or moderate severity, unspecified: Secondary | ICD-10-CM | POA: Insufficient documentation

## 2013-09-01 DIAGNOSIS — G8929 Other chronic pain: Secondary | ICD-10-CM | POA: Insufficient documentation

## 2013-09-01 DIAGNOSIS — R609 Edema, unspecified: Secondary | ICD-10-CM | POA: Insufficient documentation

## 2013-09-01 DIAGNOSIS — I10 Essential (primary) hypertension: Secondary | ICD-10-CM | POA: Insufficient documentation

## 2013-09-01 DIAGNOSIS — F4310 Post-traumatic stress disorder, unspecified: Secondary | ICD-10-CM | POA: Insufficient documentation

## 2013-09-01 DIAGNOSIS — IMO0002 Reserved for concepts with insufficient information to code with codable children: Secondary | ICD-10-CM | POA: Insufficient documentation

## 2013-09-01 DIAGNOSIS — F17200 Nicotine dependence, unspecified, uncomplicated: Secondary | ICD-10-CM | POA: Insufficient documentation

## 2013-09-01 DIAGNOSIS — Z8639 Personal history of other endocrine, nutritional and metabolic disease: Secondary | ICD-10-CM | POA: Insufficient documentation

## 2013-09-01 DIAGNOSIS — K219 Gastro-esophageal reflux disease without esophagitis: Secondary | ICD-10-CM | POA: Insufficient documentation

## 2013-09-01 DIAGNOSIS — M545 Low back pain, unspecified: Secondary | ICD-10-CM | POA: Insufficient documentation

## 2013-09-01 DIAGNOSIS — Z79899 Other long term (current) drug therapy: Secondary | ICD-10-CM | POA: Insufficient documentation

## 2013-09-01 DIAGNOSIS — R600 Localized edema: Secondary | ICD-10-CM

## 2013-09-01 DIAGNOSIS — J45909 Unspecified asthma, uncomplicated: Secondary | ICD-10-CM | POA: Insufficient documentation

## 2013-09-01 DIAGNOSIS — Z862 Personal history of diseases of the blood and blood-forming organs and certain disorders involving the immune mechanism: Secondary | ICD-10-CM | POA: Insufficient documentation

## 2013-09-01 LAB — BASIC METABOLIC PANEL
BUN: 14 mg/dL (ref 6–23)
CO2: 27 mEq/L (ref 19–32)
Chloride: 101 mEq/L (ref 96–112)
GFR calc non Af Amer: 76 mL/min — ABNORMAL LOW (ref >90)
Glucose, Bld: 79 mg/dL (ref 70–99)
Potassium: 3.7 mEq/L (ref 3.5–5.1)
Sodium: 136 mEq/L (ref 135–145)

## 2013-09-01 MED ORDER — HYDROCODONE-ACETAMINOPHEN 5-325 MG PO TABS: 1.0000 | ORAL_TABLET | Freq: Four times a day (QID) | ORAL | Status: DC | PRN

## 2013-09-01 MED ORDER — HYDROCODONE-ACETAMINOPHEN 5-325 MG PO TABS
2.0000 | ORAL_TABLET | Freq: Once | ORAL | Status: AC
  Administered 2013-09-01: 2 via ORAL
  Filled 2013-09-01: qty 2

## 2013-09-01 MED ORDER — NAPROXEN 500 MG PO TABS: 500.0000 mg | ORAL_TABLET | Freq: Two times a day (BID) | ORAL | Status: DC

## 2013-09-01 NOTE — ED Notes (Signed)
Patient states she has been having dizzy spells for a few weeks.   Patient states she had been told she had diabetes "but I wasn't claiming it".   Patient states she has had high blood pressure, but doesn't take medication.   Patient states she has bilateral ankle swelling.  Patient states has chronic back and ankle pain with swelling.

## 2013-09-01 NOTE — ED Provider Notes (Signed)
Medical screening examination/treatment/procedure(s) were performed by non-physician practitioner and as supervising physician I was immediately available for consultation/collaboration.  EKG Interpretation     Ventricular Rate:  98 PR Interval:  126 QRS Duration: 82 QT Interval:  342 QTC Calculation: 436 R Axis:   79 Text Interpretation:  Normal sinus rhythm Normal ECG No significant change was found             Glynn Octave, MD 09/01/13 2328

## 2013-09-01 NOTE — ED Provider Notes (Signed)
CSN: [id number]     Arrival date & time 09/01/13  1405 History   First MD Initiated Contact with Patient 09/01/13 1524     Chief Complaint  Patient presents with  . Dizziness  . Joint Swelling   (Consider location/radiation/quality/duration/timing/severity/associated sxs/prior Treatment) HPI Comments: Patient presents with a chief complaint of bilateral swelling of her ankles, left knee pain, and pain across her lower back.  She reports that she has had pain across her back for several years, but that the pain has been worse over the past year.  Pain does not radiate.  She reports that the pain comes in "spasms."  She states that she has seen her PCP regarding this pain and was given a Rx for Robaxin, which she has been taking.  She denies any urinary symptoms.  Denies fever or chills.  Denies bowel or bladder incontinence.  No numbness or tingling.  No acute injury or trauma of the back.  She also reports that she has had pain of her left knee for the past month.  Pain has been constant.  She reports that she recently moved and had been standing and lifting more frequently, which she feels may have made it worse.  She denies any injury or trauma to the knee.  She states that in the past she has been told that she has Arthritis of the knee.  She denies any redness, swelling, or warmth of the knee.  She has full ROM.  She is able to ambulate.  No fever or chills.  Patient also reports that she has had swelling of both lower extremities around the ankles intermittently over the past month.  She states that she is currently not on a diuretic.  She denies any history of CHF or renal disease.  She denies any history of DVT or PE.  Denies use of estrogen containing medications.  Denies prolonged travel or surgeries in the past 4 weeks.  She reports that she has been on her feet more recently, which has made the swelling worse.  She denies any pain of her calf.  Denies chest pain or SOB.  Patient denies any  dizziness and reports that she has not had any dizziness.   The history is provided by the patient.    Past Medical History  Diagnosis Date  . Hypertension   . Asthma   . Bronchitis   . GERD (gastroesophageal reflux disease)   . Thyroid disease   . PTSD (post-traumatic stress disorder)   . Bipolar affect, depressed   . Chronic back pain    Past Surgical History  Procedure Laterality Date  . Neck surgery    . Abdominal surgery     Family History  Problem Relation Age of Onset  . Hypertension Mother   . Hypertension Maternal Grandmother   . Hypertension Paternal Grandmother   . Diabetes Paternal Grandmother   . Hypertension Paternal Grandfather   . Diabetes Paternal Grandfather    History  Substance Use Topics  . Smoking status: Current Every Day Smoker -- 0.25 packs/day    Types: Cigarettes  . Smokeless tobacco: Not on file  . Alcohol Use: Yes     Comment: quit date 01/17/2013   OB History   Grav Para Term Preterm Abortions TAB SAB Ect Mult Living   1 1 1       1      Review of Systems  Constitutional: Negative for fever and chills.  Musculoskeletal: Positive for back pain and joint  swelling.  All other systems reviewed and are negative.    Allergies  Crab  Home Medications   Current Outpatient Rx  Name  Route  Sig  Dispense  Refill  . albuterol (PROVENTIL HFA;VENTOLIN HFA) 108 (90 BASE) MCG/ACT inhaler   Inhalation   Inhale 2 puffs into the lungs every 6 (six) hours as needed for wheezing.         Marland Kitchen albuterol (PROVENTIL) (2.5 MG/3ML) 0.083% nebulizer solution   Nebulization   Take 2.5 mg by nebulization every 6 (six) hours as needed for wheezing.         . diazepam (VALIUM) 5 MG tablet   Oral   Take 1 tablet (5 mg total) by mouth every 8 (eight) hours as needed (muscle spasm).   13 tablet   0   . divalproex (DEPAKOTE) 125 MG DR tablet   Oral   Take 125 mg by mouth 2 (two) times daily. 8 am and 12 noon         . Fluticasone-Salmeterol  (ADVAIR) 250-50 MCG/DOSE AEPB   Inhalation   Inhale 1 puff into the lungs daily.         . hydrOXYzine (VISTARIL) 25 MG capsule   Oral   Take 25 mg by mouth daily.          . metroNIDAZOLE (FLAGYL) 500 MG tablet   Oral   Take 1 tablet (500 mg total) by mouth 2 (two) times daily.   14 tablet   0   . naproxen (NAPROSYN) 375 MG tablet   Oral   Take 1 tablet (375 mg total) by mouth 2 (two) times daily as needed (PAIN).   20 tablet   0   . oxyCODONE-acetaminophen (PERCOCET/ROXICET) 5-325 MG per tablet   Oral   Take 1-2 tablets by mouth every 4 (four) hours as needed for pain.   13 tablet   0   . pantoprazole (PROTONIX) 20 MG tablet   Oral   Take 1 tablet (20 mg total) by mouth 2 (two) times daily.   60 tablet   0   . PARoxetine (PAXIL) 40 MG tablet   Oral   Take 40 mg by mouth daily.         . QUEtiapine Fumarate (SEROQUEL XR) 150 MG 24 hr tablet   Oral   Take 150 mg by mouth at bedtime.         . ranitidine (ZANTAC) 150 MG tablet   Oral   Take 150 mg by mouth 3 (three) times daily as needed for heartburn.          . tiotropium (SPIRIVA) 18 MCG inhalation capsule   Inhalation   Place 18 mcg into inhaler and inhale daily.          BP 109/62  Temp(Src) 98.3 F (36.8 C) (Oral)  Resp 18  Ht 5\' 6"  (1.676 m)  Wt 228 lb (103.42 kg)  BMI 36.82 kg/m2  SpO2 97%  LMP 07/14/2013 Physical Exam  Nursing note and vitals reviewed. Constitutional: She appears well-developed and well-nourished.  HENT:  Head: Normocephalic and atraumatic.  Mouth/Throat: Oropharynx is clear and moist.  Neck: Normal range of motion. Neck supple.  Cardiovascular: Normal rate, regular rhythm and normal heart sounds.   Pulmonary/Chest: Effort normal and breath sounds normal.  Musculoskeletal: Normal range of motion.       Left knee: She exhibits normal range of motion, no swelling, no effusion and no erythema. No tenderness found.  Right ankle: She exhibits swelling. She  exhibits no deformity and normal pulse.       Left ankle: She exhibits swelling. She exhibits normal range of motion, no deformity and normal pulse.       Cervical back: She exhibits normal range of motion, no tenderness, no bony tenderness, no swelling, no edema and no deformity.       Thoracic back: She exhibits normal range of motion, no tenderness, no bony tenderness, no swelling, no edema and no deformity.       Lumbar back: She exhibits tenderness and bony tenderness. She exhibits normal range of motion, no swelling, no edema and no deformity.  Patient with 1+ pitting edema of the ankles bilaterally. Negative Homan's sign bilaterally.  No edema or erythema of the calf.    Neurological: She is alert. She has normal strength. No sensory deficit.  Reflex Scores:      Patellar reflexes are 2+ on the right side and 2+ on the left side.      Achilles reflexes are 2+ on the right side and 2+ on the left side. Skin: Skin is warm and dry.  Psychiatric: She has a normal mood and affect.    ED Course  Procedures (including critical care time) Labs Review Labs Reviewed  BASIC METABOLIC PANEL - Abnormal; Notable for the following:    GFR calc non Af Amer 76 (*)    GFR calc Af Amer 89 (*)    All other components within normal limits  PRO B NATRIURETIC PEPTIDE   Imaging Review No results found.  EKG Interpretation     Ventricular Rate:  98 PR Interval:  126 QRS Duration: 82 QT Interval:  342 QTC Calculation: 436 R Axis:   79 Text Interpretation:  Normal sinus rhythm Normal ECG No significant change was found            MDM  No diagnosis found. Patient presents with a chief complaint of bilateral lower extremity swelling, which has been occurring intermittently over the past month.  Swelling located around the ankles.  No signs of infection.  Patient afebrile with full ROM of the ankles.  No pain, swelling, or erythema of the calf bilaterally.  Doubt bilateral DVT.  Patient  denies any CP or SOB.  BMP unremarkable, Creatine WNL.  BNP is 64.   Feel that the patient can follow up with PCP.  Patient instructed to elevate her legs and to wear compression stockings.  Patient also complaining of lower back pain.  No neurological deficits and normal neuro exam.  Patient can walk but states is painful.  No loss of bowel or bladder control.  No concern for cauda equina.  No fever, night sweats, weight loss, h/o cancer, IVDU.  RICE protocol and pain medicine indicated and discussed with patient.  Patient stable for discharge.  Return precautions given.     Santiago Glad, PA-C 09/01/13 [phone]

## 2013-09-03 DIAGNOSIS — N8110 Cystocele, unspecified: Secondary | ICD-10-CM | POA: Insufficient documentation

## 2013-09-15 ENCOUNTER — Encounter (HOSPITAL_COMMUNITY): Payer: Self-pay | Admitting: Emergency Medicine

## 2013-09-15 ENCOUNTER — Emergency Department (HOSPITAL_COMMUNITY): Payer: No Typology Code available for payment source

## 2013-09-15 ENCOUNTER — Emergency Department (HOSPITAL_COMMUNITY)
Admission: EM | Admit: 2013-09-15 | Discharge: 2013-09-15 | Disposition: A | Discharge status: Home / Self Care | Payer: No Typology Code available for payment source | Attending: Emergency Medicine | Admitting: Emergency Medicine

## 2013-09-15 DIAGNOSIS — Y9301 Activity, walking, marching and hiking: Secondary | ICD-10-CM | POA: Insufficient documentation

## 2013-09-15 DIAGNOSIS — S8990XA Unspecified injury of unspecified lower leg, initial encounter: Secondary | ICD-10-CM | POA: Insufficient documentation

## 2013-09-15 DIAGNOSIS — F17200 Nicotine dependence, unspecified, uncomplicated: Secondary | ICD-10-CM | POA: Insufficient documentation

## 2013-09-15 DIAGNOSIS — Y929 Unspecified place or not applicable: Secondary | ICD-10-CM | POA: Insufficient documentation

## 2013-09-15 DIAGNOSIS — IMO0002 Reserved for concepts with insufficient information to code with codable children: Secondary | ICD-10-CM | POA: Insufficient documentation

## 2013-09-15 DIAGNOSIS — S8991XA Unspecified injury of right lower leg, initial encounter: Secondary | ICD-10-CM

## 2013-09-15 DIAGNOSIS — J45909 Unspecified asthma, uncomplicated: Secondary | ICD-10-CM | POA: Insufficient documentation

## 2013-09-15 DIAGNOSIS — G8929 Other chronic pain: Secondary | ICD-10-CM | POA: Insufficient documentation

## 2013-09-15 DIAGNOSIS — M545 Low back pain, unspecified: Secondary | ICD-10-CM | POA: Insufficient documentation

## 2013-09-15 DIAGNOSIS — Z79899 Other long term (current) drug therapy: Secondary | ICD-10-CM | POA: Insufficient documentation

## 2013-09-15 DIAGNOSIS — K219 Gastro-esophageal reflux disease without esophagitis: Secondary | ICD-10-CM | POA: Insufficient documentation

## 2013-09-15 DIAGNOSIS — I10 Essential (primary) hypertension: Secondary | ICD-10-CM | POA: Insufficient documentation

## 2013-09-15 DIAGNOSIS — F3130 Bipolar disorder, current episode depressed, mild or moderate severity, unspecified: Secondary | ICD-10-CM | POA: Insufficient documentation

## 2013-09-15 DIAGNOSIS — Z8639 Personal history of other endocrine, nutritional and metabolic disease: Secondary | ICD-10-CM | POA: Insufficient documentation

## 2013-09-15 DIAGNOSIS — F4310 Post-traumatic stress disorder, unspecified: Secondary | ICD-10-CM | POA: Insufficient documentation

## 2013-09-15 DIAGNOSIS — Z791 Long term (current) use of non-steroidal anti-inflammatories (NSAID): Secondary | ICD-10-CM | POA: Insufficient documentation

## 2013-09-15 DIAGNOSIS — Z862 Personal history of diseases of the blood and blood-forming organs and certain disorders involving the immune mechanism: Secondary | ICD-10-CM | POA: Insufficient documentation

## 2013-09-15 DIAGNOSIS — W010XXA Fall on same level from slipping, tripping and stumbling without subsequent striking against object, initial encounter: Secondary | ICD-10-CM | POA: Insufficient documentation

## 2013-09-15 MED ORDER — NAPROXEN 500 MG PO TABS: 500.0000 mg | ORAL_TABLET | Freq: Two times a day (BID) | ORAL | Status: DC

## 2013-09-15 MED ORDER — CYCLOBENZAPRINE HCL 10 MG PO TABS: 10.0000 mg | ORAL_TABLET | Freq: Two times a day (BID) | ORAL | Status: DC | PRN

## 2013-09-15 MED ORDER — DIAZEPAM 5 MG PO TABS
5.0000 mg | ORAL_TABLET | Freq: Once | ORAL | Status: AC
  Administered 2013-09-15: 5 mg via ORAL
  Filled 2013-09-15: qty 1

## 2013-09-15 MED ORDER — KETOROLAC TROMETHAMINE 60 MG/2ML IM SOLN
60.0000 mg | Freq: Once | INTRAMUSCULAR | Status: AC
  Administered 2013-09-15: 60 mg via INTRAMUSCULAR
  Filled 2013-09-15: qty 2

## 2013-09-15 NOTE — ED Provider Notes (Signed)
CSN: [id number]     Arrival date & time 09/15/13  1307 History  This chart was scribed for Emilia Beck, PA, working with Harrold Donath R. Rubin Payor, MD, by Ardelia Mems ED Scribe. This patient was seen in room TR05C/TR05C and the patient's care was started at 2:20 PM.   Chief Complaint  Patient presents with  . Knee Pain    Patient is a 47 y.o. female presenting with knee pain. The history is provided by the patient. No language interpreter was used.  Knee Pain Location:  Knee Time since incident:  3 days Injury: yes   Knee location:  R knee Pain details:    Quality:  Aching   Radiates to:  Does not radiate   Severity:  Moderate   Onset quality:  Gradual   Duration:  3 days   Timing:  Constant   Progression:  Waxing and waning Chronicity:  New Dislocation: no   Foreign body present:  No foreign bodies Prior injury to area:  No Relieved by:  Nothing Worsened by:  Bearing weight Ineffective treatments:  Arthritis medication (Aleve, Robaxin) Associated symptoms: back pain   Associated symptoms: no decreased ROM and no neck pain     HPI Comments: Alicia Blake is a 47 y.o. Female with a history of chronic back pain who presents to the Emergency Department complaining of constant, moderate right knee pain with associated bruising onset after a fall that occurred 3 days ago. She states that she tripped while walking down stairs, landing on her lateral right knee. She denies any other injuries occurring at the time of this fall. She also states that she has had swelling to her left ankle over the past week. She states that she is prescribed Robaxin and has been taking this without relief. She also states that she has taken Aleve and has applied arthritis cream to her knee without relief.  She also has a history of chronic back pain, and states that she is having a flare-up of lower back pain today. She describes this pain as "spasming". She states that she would like to have a Toradol  shot in her back. 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 She denies any other pain or symptoms.  PCP- Dr. Dartha Lodge   Past Medical History  Diagnosis Date  . Hypertension   . Asthma   . Bronchitis   . GERD (gastroesophageal reflux disease)   . Thyroid disease   . PTSD (post-traumatic stress disorder)   . Bipolar affect, depressed   . Chronic back pain    Past Surgical History  Procedure Laterality Date  . Neck surgery    . Abdominal surgery     Family History  Problem Relation Age of Onset  . Hypertension Mother   . Hypertension Maternal Grandmother   . Hypertension Paternal Grandmother   . Diabetes Paternal Grandmother   . Hypertension Paternal Grandfather   . Diabetes Paternal Grandfather    History  Substance Use Topics  . Smoking status: Current Every Day Smoker -- 0.25 packs/day    Types: Cigarettes  . Smokeless tobacco: Not on file  . Alcohol Use: Yes     Comment: quit date 01/17/2013   OB History   Grav Para Term Preterm Abortions TAB SAB Ect Mult Living   1 1 1       1      Review of Systems  Cardiovascular: Positive for leg swelling.  Musculoskeletal: Positive for arthralgias (  right knee) and back pain. Negative for neck pain.  All other systems reviewed and are negative.   Allergies  Crab and Lithium  Home Medications   Current Outpatient Rx  Name  Route  Sig  Dispense  Refill  . albuterol (PROVENTIL HFA;VENTOLIN HFA) 108 (90 BASE) MCG/ACT inhaler   Inhalation   Inhale 2 puffs into the lungs every 6 (six) hours as needed for wheezing.         Marland Kitchen albuterol (PROVENTIL) (2.5 MG/3ML) 0.083% nebulizer solution   Nebulization   Take 2.5 mg by nebulization every 6 (six) hours as needed for wheezing.         . ARIPiprazole (ABILIFY) 2 MG tablet   Oral   Take 2 mg by mouth daily.         . divalproex (DEPAKOTE) 125 MG DR tablet   Oral   Take 125 mg by mouth 2 (two) times daily. 8 am and 12 noon         .  Fluticasone-Salmeterol (ADVAIR) 250-50 MCG/DOSE AEPB   Inhalation   Inhale 1 puff into the lungs daily as needed. For shortness of breath         . gabapentin (NEURONTIN) 300 MG capsule   Oral   Take 300 mg by mouth 3 (three) times daily.         . naproxen (NAPROSYN) 500 MG tablet   Oral   Take 500 mg by mouth 2 (two) times daily.         . QUEtiapine Fumarate (SEROQUEL XR) 150 MG 24 hr tablet   Oral   Take 150 mg by mouth at bedtime.         . ranitidine (ZANTAC) 150 MG tablet   Oral   Take 150 mg by mouth 3 (three) times daily as needed for heartburn.          . tiotropium (SPIRIVA) 18 MCG inhalation capsule   Inhalation   Place 18 mcg into inhaler and inhale daily as needed. For shortness of breath          Triage Vitals: BP 115/78  Pulse 95  Temp(Src) 97.6 F (36.4 C) (Oral)  Resp 22  Ht 5\' 6"  (1.676 m)  Wt 233 lb 4.8 oz (105.824 kg)  BMI 37.67 kg/m2  SpO2 99%  LMP 07/14/2013  Physical Exam  Nursing note and vitals reviewed. Constitutional: She is oriented to person, place, and time. She appears well-developed and well-nourished. No distress.  HENT:  Head: Normocephalic and atraumatic.  Eyes: EOM are normal.  Neck: Neck supple. No tracheal deviation present.  Cardiovascular: Normal rate.   Pulmonary/Chest: Effort normal. No respiratory distress.  Musculoskeletal: Normal range of motion. She exhibits tenderness. She exhibits no edema.  Medial right knee tender to palpation. No obvious deformity. No edema. Bruise noted in the area of tenderness. Full ROM.  Neurological: She is alert and oriented to person, place, and time.  Skin: Skin is warm and dry.  Psychiatric: She has a normal mood and affect. Her behavior is normal.    ED Course  Procedures (including critical care time)  DIAGNOSTIC STUDIES: Oxygen Saturation is 99% on RA, normal by my interpretation.    COORDINATION OF CARE: 2:29 PM- Discussed normal radiology findings. Will order  medications. Pt advised of plan for treatment and pt agrees.  Medications  ketorolac (TORADOL) injection 60 mg (not administered)  diazepam (VALIUM) tablet 5 mg (not administered)   Labs Review Labs Reviewed - No data  to display Imaging Review Dg Knee Complete 4 Views Right  09/15/2013   CLINICAL DATA:  Right knee from injury 3 days ago  EXAM: RIGHT KNEE - COMPLETE 4+ VIEW  COMPARISON:  None.  FINDINGS: There is no evidence of fracture, dislocation, or joint effusion. There is no evidence of arthropathy or other focal bone abnormality. Soft tissues are unremarkable.  IMPRESSION: No acute osseous injury of the right knee.   Electronically Signed   By: Elige Ko   On: 09/15/2013 14:13    EKG Interpretation   None       MDM   1. Right knee injury, initial encounter    Patient will have toradol and valium for right knee injury. Xray unremarkable for acute changes. Patient will have pain medication to take at home.    I personally performed the services described in this documentation, which was scribed in my presence. The recorded information has been reviewed and is accurate.    Emilia Beck, PA-C 09/15/13 1514

## 2013-09-15 NOTE — ED Notes (Signed)
Rt knee pain  x3 days and back pain all the time and now feels worse

## 2013-09-15 NOTE — ED Notes (Signed)
Pt tripped and fell onto right knee 2 days ago. Right knee swollen and painful.

## 2013-09-20 NOTE — ED Provider Notes (Signed)
Medical screening examination/treatment/procedure(s) were performed by non-physician practitioner and as supervising physician I was immediately available for consultation/collaboration.  EKG Interpretation   None        Kolbe Delmonaco R. Zeb Rawl, MD 09/20/13 1555 

## 2013-09-28 ENCOUNTER — Encounter (HOSPITAL_COMMUNITY): Payer: Self-pay | Admitting: Emergency Medicine

## 2013-09-28 ENCOUNTER — Emergency Department (HOSPITAL_COMMUNITY)
Admission: EM | Admit: 2013-09-28 | Discharge: 2013-09-28 | Disposition: A | Discharge status: Home / Self Care | Payer: No Typology Code available for payment source | Attending: Emergency Medicine | Admitting: Emergency Medicine

## 2013-09-28 DIAGNOSIS — I10 Essential (primary) hypertension: Secondary | ICD-10-CM | POA: Insufficient documentation

## 2013-09-28 DIAGNOSIS — Z862 Personal history of diseases of the blood and blood-forming organs and certain disorders involving the immune mechanism: Secondary | ICD-10-CM | POA: Insufficient documentation

## 2013-09-28 DIAGNOSIS — K219 Gastro-esophageal reflux disease without esophagitis: Secondary | ICD-10-CM | POA: Insufficient documentation

## 2013-09-28 DIAGNOSIS — Z8639 Personal history of other endocrine, nutritional and metabolic disease: Secondary | ICD-10-CM | POA: Insufficient documentation

## 2013-09-28 DIAGNOSIS — F4310 Post-traumatic stress disorder, unspecified: Secondary | ICD-10-CM | POA: Insufficient documentation

## 2013-09-28 DIAGNOSIS — J45909 Unspecified asthma, uncomplicated: Secondary | ICD-10-CM | POA: Insufficient documentation

## 2013-09-28 DIAGNOSIS — Z79899 Other long term (current) drug therapy: Secondary | ICD-10-CM | POA: Insufficient documentation

## 2013-09-28 DIAGNOSIS — F17200 Nicotine dependence, unspecified, uncomplicated: Secondary | ICD-10-CM | POA: Insufficient documentation

## 2013-09-28 DIAGNOSIS — M25569 Pain in unspecified knee: Secondary | ICD-10-CM | POA: Insufficient documentation

## 2013-09-28 DIAGNOSIS — G8911 Acute pain due to trauma: Secondary | ICD-10-CM | POA: Insufficient documentation

## 2013-09-28 DIAGNOSIS — G8929 Other chronic pain: Secondary | ICD-10-CM | POA: Insufficient documentation

## 2013-09-28 DIAGNOSIS — F319 Bipolar disorder, unspecified: Secondary | ICD-10-CM | POA: Insufficient documentation

## 2013-09-28 MED ORDER — NAPROXEN 500 MG PO TABS: 500.0000 mg | ORAL_TABLET | Freq: Two times a day (BID) | ORAL | Status: DC

## 2013-09-28 MED ORDER — HYDROCODONE-ACETAMINOPHEN 5-325 MG PO TABS: 1.0000 | ORAL_TABLET | Freq: Four times a day (QID) | ORAL | Status: DC | PRN

## 2013-09-28 NOTE — ED Notes (Signed)
Pt reports being seen here recently for same x 2. Having pain and swelling to right knee and lower leg. Ambulatory at triage.

## 2013-09-28 NOTE — ED Provider Notes (Signed)
CSN: [id number]     Arrival date & time 09/28/13  1511 History  This chart was scribed for non-physician practitioner, Santiago Glad, PA-C,working with Glynn Octave, MD, by Karle Plumber, ED Scribe.  This patient was seen in room TR06C/TR06C and the patient's care was started at 4:01 PM.  Chief Complaint  Patient presents with  . Leg Pain   The history is provided by the patient. No language interpreter was used.   HPI Comments:  Alicia Blake is a 47 y.o. female who presents to the Emergency Department complaining of ongoing severe right knee pain. Pt states she fell last week on the knee and was told by the ED that she had sprained her knee. She reports worsening aching and swelling of the knee. She reports some numbness and tingling. She states she went to the doctor in [place], [place] and was told that she had fluid on the knee. She states she has taken Advil PM for the pain with mild relief. She reports icing and elevating knee. She states she was referred to the orthopedist, but has not been able to get an appt yet.   Past Medical History  Diagnosis Date  . Hypertension   . Asthma   . Bronchitis   . GERD (gastroesophageal reflux disease)   . Thyroid disease   . PTSD (post-traumatic stress disorder)   . Bipolar affect, depressed   . Chronic back pain    Past Surgical History  Procedure Laterality Date  . Neck surgery    . Abdominal surgery     Family History  Problem Relation Age of Onset  . Hypertension Mother   . Hypertension Maternal Grandmother   . Hypertension Paternal Grandmother   . Diabetes Paternal Grandmother   . Hypertension Paternal Grandfather   . Diabetes Paternal Grandfather    History  Substance Use Topics  . Smoking status: Current Every Day Smoker -- 0.25 packs/day    Types: Cigarettes  . Smokeless tobacco: Not on file  . Alcohol Use: Yes     Comment: quit date 01/17/2013   OB History   Grav Para Term Preterm Abortions TAB SAB Ect Mult  Living   1 1 1       1      Review of Systems  All other systems reviewed and are negative.    Allergies  Crab and Lithium  Home Medications   Current Outpatient Rx  Name  Route  Sig  Dispense  Refill  . albuterol (PROVENTIL HFA;VENTOLIN HFA) 108 (90 BASE) MCG/ACT inhaler   Inhalation   Inhale 2 puffs into the lungs every 6 (six) hours as needed for wheezing.         Marland Kitchen albuterol (PROVENTIL) (2.5 MG/3ML) 0.083% nebulizer solution   Nebulization   Take 2.5 mg by nebulization every 6 (six) hours as needed for wheezing.         . ARIPiprazole (ABILIFY) 2 MG tablet   Oral   Take 2 mg by mouth daily.         . cyclobenzaprine (FLEXERIL) 10 MG tablet   Oral   Take 1 tablet (10 mg total) by mouth 2 (two) times daily as needed for muscle spasms.   20 tablet   0   . divalproex (DEPAKOTE) 125 MG DR tablet   Oral   Take 125 mg by mouth 2 (two) times daily. 8 am and 12 noon         . Fluticasone-Salmeterol (ADVAIR) 250-50 MCG/DOSE AEPB  Inhalation   Inhale 1 puff into the lungs daily as needed. For shortness of breath         . gabapentin (NEURONTIN) 300 MG capsule   Oral   Take 300 mg by mouth 3 (three) times daily.         . naproxen (NAPROSYN) 500 MG tablet   Oral   Take 500 mg by mouth 2 (two) times daily.         . naproxen (NAPROSYN) 500 MG tablet   Oral   Take 1 tablet (500 mg total) by mouth 2 (two) times daily with a meal.   30 tablet   0   . QUEtiapine Fumarate (SEROQUEL XR) 150 MG 24 hr tablet   Oral   Take 150 mg by mouth at bedtime.         . ranitidine (ZANTAC) 150 MG tablet   Oral   Take 150 mg by mouth 3 (three) times daily as needed for heartburn.          . tiotropium (SPIRIVA) 18 MCG inhalation capsule   Inhalation   Place 18 mcg into inhaler and inhale daily as needed. For shortness of breath          Triage Vitals: BP 145/73  Pulse 84  Temp(Src) 97.9 F (36.6 C) (Oral)  Resp 16  SpO2 100%  LMP 07/14/2013 Physical  Exam  Nursing note and vitals reviewed. Constitutional: She is oriented to person, place, and time. She appears well-developed and well-nourished.  HENT:  Head: Normocephalic and atraumatic.  Neck: Neck supple.  Cardiovascular: Normal rate and regular rhythm.   2+ dorsalis pedis pulse in right foot. Trace pitting edema of lower extremities bilaterally.   Pulmonary/Chest: Effort normal and breath sounds normal. No respiratory distress.  Musculoskeletal: Normal range of motion. She exhibits edema and tenderness.  Tender to palpation of medial aspect of right knee. Mild diffused swelling. No effusion. Full ROM of right knee.   Neurological: She is alert and oriented to person, place, and time. No cranial nerve deficit.  Distal sensation of all toes of right foot intact.  Skin: Skin is warm and dry.  No erythema or warmth to the touch.  Psychiatric: She has a normal mood and affect. Her behavior is normal.    ED Course  Procedures (including critical care time) DIAGNOSTIC STUDIES: Oxygen Saturation is 100% on RA, normal by my interpretation.   COORDINATION OF CARE: 4:10 PM- Advised pt to take OTC Ibuprofen or Naproxen and keep knee elevated. Told pt to keep appt with orthopedist when she gets it scheduled. Will prescribe a pain medication for her to use as needed. Will provide knee brace for pt. Advised Pt verbalizes understanding and agrees to plan.  Medications - No data to display  Labs Review Labs Reviewed - No data to display Imaging Review No results found.  EKG Interpretation   None       MDM  No diagnosis found. Patient presenting with knee pain.  No signs of infection.  Patient afebrile.  Full ROM of the knee.  Therefore, doubt septic joint.  Patient stable for discharge.  Patient given knee sleeve and instructed to take NSAIDs.  Return precautions given.    I personally performed the services described in this documentation, which was scribed in my presence. The  recorded information has been reviewed and is accurate.    Santiago Glad, PA-C 09/29/13 [phone]

## 2013-09-28 NOTE — Progress Notes (Signed)
Orthopedic Tech Progress Note Patient Details:  Alicia Blake 05/14/1966 [id number]  Ortho Devices Type of Ortho Device: Knee Sleeve Ortho Device/Splint Location: rle Ortho Device/Splint Interventions: Application   Nikki Dom 09/28/2013, 4:29 PM

## 2013-09-29 NOTE — ED Provider Notes (Signed)
Medical screening examination/treatment/procedure(s) were performed by non-physician practitioner and as supervising physician I was immediately available for consultation/collaboration.  EKG Interpretation   None         Glynn Octave, MD 09/29/13 0100

## 2014-02-24 ENCOUNTER — Emergency Department (HOSPITAL_COMMUNITY)
Admission: EM | Admit: 2014-02-24 | Discharge: 2014-02-25 | Disposition: A | Discharge status: Home / Self Care | Payer: No Typology Code available for payment source | Attending: Emergency Medicine | Admitting: Emergency Medicine

## 2014-02-24 ENCOUNTER — Encounter (HOSPITAL_COMMUNITY): Payer: Self-pay | Admitting: Emergency Medicine

## 2014-02-24 DIAGNOSIS — F3130 Bipolar disorder, current episode depressed, mild or moderate severity, unspecified: Secondary | ICD-10-CM | POA: Insufficient documentation

## 2014-02-24 DIAGNOSIS — M21969 Unspecified acquired deformity of unspecified lower leg: Secondary | ICD-10-CM | POA: Insufficient documentation

## 2014-02-24 DIAGNOSIS — G8929 Other chronic pain: Secondary | ICD-10-CM | POA: Insufficient documentation

## 2014-02-24 DIAGNOSIS — S99919A Unspecified injury of unspecified ankle, initial encounter: Secondary | ICD-10-CM

## 2014-02-24 DIAGNOSIS — S99929A Unspecified injury of unspecified foot, initial encounter: Secondary | ICD-10-CM

## 2014-02-24 DIAGNOSIS — A599 Trichomoniasis, unspecified: Secondary | ICD-10-CM

## 2014-02-24 DIAGNOSIS — M25562 Pain in left knee: Secondary | ICD-10-CM

## 2014-02-24 DIAGNOSIS — J45909 Unspecified asthma, uncomplicated: Secondary | ICD-10-CM | POA: Insufficient documentation

## 2014-02-24 DIAGNOSIS — S8990XA Unspecified injury of unspecified lower leg, initial encounter: Secondary | ICD-10-CM | POA: Insufficient documentation

## 2014-02-24 DIAGNOSIS — Z3202 Encounter for pregnancy test, result negative: Secondary | ICD-10-CM | POA: Insufficient documentation

## 2014-02-24 DIAGNOSIS — IMO0002 Reserved for concepts with insufficient information to code with codable children: Secondary | ICD-10-CM | POA: Insufficient documentation

## 2014-02-24 DIAGNOSIS — X500XXA Overexertion from strenuous movement or load, initial encounter: Secondary | ICD-10-CM | POA: Insufficient documentation

## 2014-02-24 DIAGNOSIS — M549 Dorsalgia, unspecified: Secondary | ICD-10-CM

## 2014-02-24 DIAGNOSIS — K219 Gastro-esophageal reflux disease without esophagitis: Secondary | ICD-10-CM | POA: Insufficient documentation

## 2014-02-24 DIAGNOSIS — M25572 Pain in left ankle and joints of left foot: Secondary | ICD-10-CM

## 2014-02-24 DIAGNOSIS — W108XXA Fall (on) (from) other stairs and steps, initial encounter: Secondary | ICD-10-CM | POA: Insufficient documentation

## 2014-02-24 DIAGNOSIS — Y929 Unspecified place or not applicable: Secondary | ICD-10-CM | POA: Insufficient documentation

## 2014-02-24 DIAGNOSIS — A5901 Trichomonal vulvovaginitis: Secondary | ICD-10-CM | POA: Insufficient documentation

## 2014-02-24 DIAGNOSIS — Z791 Long term (current) use of non-steroidal anti-inflammatories (NSAID): Secondary | ICD-10-CM | POA: Insufficient documentation

## 2014-02-24 DIAGNOSIS — I10 Essential (primary) hypertension: Secondary | ICD-10-CM | POA: Insufficient documentation

## 2014-02-24 DIAGNOSIS — Y9389 Activity, other specified: Secondary | ICD-10-CM | POA: Insufficient documentation

## 2014-02-24 DIAGNOSIS — Z8781 Personal history of (healed) traumatic fracture: Secondary | ICD-10-CM | POA: Insufficient documentation

## 2014-02-24 DIAGNOSIS — F17200 Nicotine dependence, unspecified, uncomplicated: Secondary | ICD-10-CM | POA: Insufficient documentation

## 2014-02-24 DIAGNOSIS — Z79899 Other long term (current) drug therapy: Secondary | ICD-10-CM | POA: Insufficient documentation

## 2014-02-24 LAB — URINALYSIS, ROUTINE W REFLEX MICROSCOPIC
Bilirubin Urine: NEGATIVE
Glucose, UA: NEGATIVE mg/dL
Hgb urine dipstick: NEGATIVE
KETONES UR: NEGATIVE mg/dL
NITRITE: NEGATIVE
Protein, ur: NEGATIVE mg/dL
Specific Gravity, Urine: 1.01 (ref 1.005–1.030)
UROBILINOGEN UA: 1 mg/dL (ref 0.0–1.0)
pH: 8.5 — ABNORMAL HIGH (ref 5.0–8.0)

## 2014-02-24 LAB — URINE MICROSCOPIC-ADD ON

## 2014-02-24 LAB — POC URINE PREG, ED: Preg Test, Ur: NEGATIVE

## 2014-02-24 NOTE — ED Notes (Addendum)
Reports falling down steps last night, having lower back pain and left knee and ankle pain. Hx of back pain, having muscle spasms to lower back. Pt is ambulatory at triage. Reports also having lower abd pain, more severe when she urinates.

## 2014-02-25 LAB — HIV ANTIBODY (ROUTINE TESTING W REFLEX): HIV 1&2 Ab, 4th Generation: NONREACTIVE

## 2014-02-25 LAB — WET PREP, GENITAL
WBC, Wet Prep HPF POC: NONE SEEN
Yeast Wet Prep HPF POC: NONE SEEN

## 2014-02-25 MED ORDER — ONDANSETRON 4 MG PO TBDP
8.0000 mg | ORAL_TABLET | Freq: Once | ORAL | Status: AC
  Administered 2014-02-25: 8 mg via ORAL
  Filled 2014-02-25: qty 2

## 2014-02-25 MED ORDER — MELOXICAM 15 MG PO TABS: 15.0000 mg | ORAL_TABLET | Freq: Every day | ORAL | Status: DC

## 2014-02-25 MED ORDER — CYCLOBENZAPRINE HCL 10 MG PO TABS
5.0000 mg | ORAL_TABLET | Freq: Once | ORAL | Status: AC
  Administered 2014-02-25: 5 mg via ORAL
  Filled 2014-02-25: qty 1

## 2014-02-25 MED ORDER — METRONIDAZOLE 500 MG PO TABS
2000.0000 mg | ORAL_TABLET | Freq: Once | ORAL | Status: AC
  Administered 2014-02-25: 2000 mg via ORAL
  Filled 2014-02-25: qty 4

## 2014-02-25 MED ORDER — CYCLOBENZAPRINE HCL 10 MG PO TABS: 10.0000 mg | ORAL_TABLET | Freq: Two times a day (BID) | ORAL | Status: DC | PRN

## 2014-02-25 MED ORDER — KETOROLAC TROMETHAMINE 60 MG/2ML IM SOLN
60.0000 mg | Freq: Once | INTRAMUSCULAR | Status: AC
  Administered 2014-02-25: 60 mg via INTRAMUSCULAR
  Filled 2014-02-25: qty 2

## 2014-02-25 NOTE — ED Provider Notes (Signed)
CSN: [id number]     Arrival date & time 02/24/14  1801 History   First MD Initiated Contact with Patient 02/25/14 0010     Chief Complaint  Patient presents with  . Back Pain  . Abdominal Pain     (Consider location/radiation/quality/duration/timing/severity/associated sxs/prior Treatment) HPI 48 year old female presents to emergency department with multiple complaints.  Patient reports she fell 2-3 days ago twisting her left ankle.  She reports she has prior fracture to the ankle in 2013, and is actively trying to see orthopedics as she has chronic pain and swelling in the ankle.  Patient also complains of chronic left knee pain, worse over the last one to 2 weeks.  Patient also complains of left-sided low back pain ongoing for last 3-4 weeks.  She reports his pain in her back is worse with movement.  She reports she feels a knot in the area like a muscle spasm.  She reports that her regular doctor has her on Robaxin which is not helping.  She has been referred to a chiropractor and orthopedics for her back pain.  Patient also complains about burning on urination.  She reports that she recently had sexual intercourse with a new partner who was larger than what she was used to, and reports the condom caused her chafing.  Since that time, she has had pain with urination.  She also complains of lower abdominal pain.  Normal bowel movements.  No fever no chills.  No nausea no vomiting Past Medical History  Diagnosis Date  . Hypertension   . Asthma   . Bronchitis   . GERD (gastroesophageal reflux disease)   . Thyroid disease   . PTSD (post-traumatic stress disorder)   . Bipolar affect, depressed   . Chronic back pain    Past Surgical History  Procedure Laterality Date  . Neck surgery    . Abdominal surgery     Family History  Problem Relation Age of Onset  . Hypertension Mother   . Hypertension Maternal Grandmother   . Hypertension Paternal Grandmother   . Diabetes Paternal Grandmother    . Hypertension Paternal Grandfather   . Diabetes Paternal Grandfather    History  Substance Use Topics  . Smoking status: Current Every Day Smoker -- 0.25 packs/day    Types: Cigarettes  . Smokeless tobacco: Not on file  . Alcohol Use: Yes     Comment: quit date 01/17/2013   OB History   Grav Para Term Preterm Abortions TAB SAB Ect Mult Living   1 1 1       1      Review of Systems   See History of Present Illness; otherwise all other systems are reviewed and negative  Allergies  Crab and Lithium  Home Medications   Prior to Admission medications   Medication Sig Start Date End Date Taking? Authorizing Provider  albuterol (PROVENTIL HFA;VENTOLIN HFA) 108 (90 BASE) MCG/ACT inhaler Inhale 2 puffs into the lungs every 6 (six) hours as needed for wheezing.   Yes Historical Provider, MD  Fluticasone-Salmeterol (ADVAIR) 250-50 MCG/DOSE AEPB Inhale 1 puff into the lungs daily as needed. For shortness of breath   Yes Historical Provider, MD  furosemide (LASIX) 20 MG tablet Take 20 mg by mouth.   Yes Historical Provider, MD  loratadine (CLARITIN) 10 MG tablet Take 10 mg by mouth daily.   Yes Historical Provider, MD  meloxicam (MOBIC) 15 MG tablet Take 15 mg by mouth daily.   Yes Historical Provider, MD  Multiple Vitamins-Minerals (MULTIVITAMIN WITH MINERALS) tablet Take 1 tablet by mouth daily.   Yes Historical Provider, MD  OLANZapine (ZYPREXA) 15 MG tablet Take 7.5 mg by mouth at bedtime.   Yes Historical Provider, MD  omeprazole (PRILOSEC) 20 MG capsule Take 20 mg by mouth daily.   Yes Historical Provider, MD  tiotropium (SPIRIVA) 18 MCG inhalation capsule Place 18 mcg into inhaler and inhale daily as needed. For shortness of breath   Yes Historical Provider, MD  tolterodine (DETROL LA) 2 MG 24 hr capsule Take 2 mg by mouth daily.   Yes Historical Provider, MD  albuterol (PROVENTIL) (2.5 MG/3ML) 0.083% nebulizer solution Take 2.5 mg by nebulization every 6 (six) hours as needed for  wheezing.    Historical Provider, MD   BP 127/91  Pulse 81  Temp(Src) 98.8 F (37.1 C) (Oral)  Resp 18  Ht 5\' 6"  (1.676 m)  Wt 227 lb (102.967 kg)  BMI 36.66 kg/m2  SpO2 99%  LMP 07/14/2013 Physical Exam  Nursing note and vitals reviewed. Constitutional: She is oriented to person, place, and time. She appears well-developed and well-nourished.  HENT:  Head: Normocephalic and atraumatic.  Nose: Nose normal.  Mouth/Throat: Oropharynx is clear and moist.  Eyes: Conjunctivae and EOM are normal. Pupils are equal, round, and reactive to light.  Neck: Normal range of motion. Neck supple. No JVD present. No tracheal deviation present. No thyromegaly present.  Cardiovascular: Normal rate, regular rhythm, normal heart sounds and intact distal pulses.  Exam reveals no gallop and no friction rub.   No murmur heard. Pulmonary/Chest: Effort normal and breath sounds normal. No stridor. No respiratory distress. She has no wheezes. She has no rales. She exhibits no tenderness.  Abdominal: Soft. Bowel sounds are normal. She exhibits no distension and no mass. There is no tenderness. There is no rebound and no guarding.  Genitourinary:  External genitalia normal Vagina with mild white discharge Cervix closed no lesions No cervical motion tenderness Adnexa palpated, no masses or tenderness noted Bladder palpated no tenderness Uterus palpated no masses or tenderness    Musculoskeletal: Normal range of motion. She exhibits tenderness (patient has tenderness to left SI joint, diffuse tenderness over left knee and left ankle.  Patient has chronic deformity to left ankle, no acute edema or crepitus.  Left knee is normal to exam.  Patient has normal range of motion). She exhibits no edema.  Lymphadenopathy:    She has no cervical adenopathy.  Neurological: She is alert and oriented to person, place, and time. She has normal reflexes. No cranial nerve deficit. She exhibits normal muscle tone. Coordination  normal.  Skin: Skin is warm and dry. No rash noted. No erythema. No pallor.  Psychiatric: She has a normal mood and affect. Her behavior is normal. Judgment and thought content normal.    ED Course  Procedures (including critical care time) Labs Review Labs Reviewed  WET PREP, GENITAL - Abnormal; Notable for the following:    Trich, Wet Prep MODERATE (*)    Clue Cells Wet Prep HPF POC FEW (*)    All other components within normal limits  URINALYSIS, ROUTINE W REFLEX MICROSCOPIC - Abnormal; Notable for the following:    APPearance CLOUDY (*)    pH 8.5 (*)    Leukocytes, UA TRACE (*)    All other components within normal limits  URINE MICROSCOPIC-ADD ON - Abnormal; Notable for the following:    Squamous Epithelial / LPF MANY (*)    Bacteria, UA MANY (*)  All other components within normal limits  GC/CHLAMYDIA PROBE AMP  HIV ANTIBODY (ROUTINE TESTING)  POC URINE PREG, ED    Imaging Review No results found.   EKG Interpretation None      MDM   Final diagnoses:  Trichomoniasis  Back pain  Ankle pain, left  Knee pain, left    48 year old female with multiple complaints.  Most on pelvic exam, patient treated with 2 g of Flagyl here.  She is advised that her partner needs to get treatment as well.  Patient with acute on chronic arthralgias of back, and the ankle.  She is currently undergoing workup for same.  Patient reports her Robaxin is not helping, we'll start her on Flexeril instead.  Patient advised to followup with her primary care Dr. and her orthopedist.    Kalman Drape, MD 02/25/14 [phone]

## 2014-02-25 NOTE — ED Notes (Signed)
Dr. Sharol Given at [facility] for pelvic exam, chaperone present.

## 2014-02-25 NOTE — ED Notes (Signed)
Pt seen by EDP prior to RN assessment, see MD notes, orders received and initiated. Pt alert, NAD, calm.

## 2014-02-25 NOTE — Discharge Instructions (Signed)
Back Pain, Adult Low back pain is very common. About 1 in 5 people have back pain.The cause of low back pain is rarely dangerous. The pain often gets better over time.About half of people with a sudden onset of back pain feel better in just 2 weeks. About 8 in 10 people feel better by 6 weeks.  CAUSES Some common causes of back pain include:  Strain of the muscles or ligaments supporting the spine.  Wear and tear (degeneration) of the spinal discs.  Arthritis.  Direct injury to the back. DIAGNOSIS Most of the time, the direct cause of low back pain is not known.However, back pain can be treated effectively even when the exact cause of the pain is unknown.Answering your caregiver's questions about your overall health and symptoms is one of the most accurate ways to make sure the cause of your pain is not dangerous. If your caregiver needs more information, he or she may order lab work or imaging tests (X-rays or MRIs).However, even if imaging tests show changes in your back, this usually does not require surgery. HOME CARE INSTRUCTIONS For many people, back pain returns.Since low back pain is rarely dangerous, it is often a condition that people can learn to [facility] their own.   Remain active. It is stressful on the back to sit or stand in one place. Do not sit, drive, or stand in one place for more than 30 minutes at a time. Take short walks on level surfaces as soon as pain allows.Try to increase the length of time you walk each day.  Do not stay in bed.Resting more than 1 or 2 days can delay your recovery.  Do not avoid exercise or work.Your body is made to move.It is not dangerous to be active, even though your back may hurt.Your back will likely heal faster if you return to being active before your pain is gone.  Pay attention to your body when you bend and lift. Many people have less discomfortwhen lifting if they bend their knees, keep the load close to their bodies,and  avoid twisting. Often, the most comfortable positions are those that put less stress on your recovering back.  Find a comfortable position to sleep. Use a firm mattress and lie on your side with your knees slightly bent. If you lie on your back, put a pillow under your knees.  Only take over-the-counter or prescription medicines as directed by your caregiver. Over-the-counter medicines to reduce pain and inflammation are often the most helpful.Your caregiver may prescribe muscle relaxant drugs.These medicines help dull your pain so you can more quickly return to your normal activities and healthy exercise.  Put ice on the injured area.  Put ice in a plastic bag.  Place a towel between your skin and the bag.  Leave the ice on for 15-20 minutes, 03-04 times a day for the first 2 to 3 days. After that, ice and heat may be alternated to reduce pain and spasms.  Ask your caregiver about trying back exercises and gentle massage. This may be of some benefit.  Avoid feeling anxious or stressed.Stress increases muscle tension and can worsen back pain.It is important to recognize when you are anxious or stressed and learn ways to manage it.Exercise is a great option. SEEK MEDICAL CARE IF:  You have pain that is not relieved with rest or medicine.  You have pain that does not improve in 1 week.  You have new symptoms.  You are generally not feeling well. SEEK  IMMEDIATE MEDICAL CARE IF:   You have pain that radiates from your back into your legs.  You develop new bowel or bladder control problems.  You have unusual weakness or numbness in your arms or legs.  You develop nausea or vomiting.  You develop abdominal pain.  You feel faint. Document Released: 10/14/2005 Document Revised: 04/14/2012 Document Reviewed: 03/04/2011 [facility] Patient Information 2014 [place], [place].  Chronic Back Pain  When back pain lasts longer than 3 months, it is called chronic back pain.People with  chronic back pain often go through certain periods that are more intense (flare-ups).  CAUSES Chronic back pain can be caused by wear and tear (degeneration) on different structures in your back. These structures include:  The bones of your spine (vertebrae) and the joints surrounding your spinal cord and nerve roots (facets).  The strong, fibrous tissues that connect your vertebrae (ligaments). Degeneration of these structures may result in pressure on your nerves. This can lead to constant pain. HOME CARE INSTRUCTIONS  Avoid bending, heavy lifting, prolonged sitting, and activities which make the problem worse.  Take brief periods of rest throughout the day to reduce your pain. Lying down or standing usually is better than sitting while you are resting.  Take over-the-counter or prescription medicines only as directed by your caregiver. SEEK IMMEDIATE MEDICAL CARE IF:   You have weakness or numbness in one of your legs or feet.  You have trouble controlling your bladder or bowels.  You have nausea, vomiting, abdominal pain, shortness of breath, or fainting. Document Released: 11/21/2004 Document Revised: 01/06/2012 Document Reviewed: 09/28/2011 [facility] Patient Information 2014 [place], [place].  Knee Pain Knee pain can be a result of an injury or other medical conditions. Treatment will depend on the cause of your pain. HOME CARE  Only take medicine as told by your doctor.  Keep a healthy weight. Being overweight can make the knee hurt more.  Stretch before exercising or playing sports.  If there is constant knee pain, change the way you exercise. Ask your doctor for advice.  Make sure shoes fit well. Choose the right shoe for the sport or activity.  Protect your knees. Wear kneepads if needed.  Rest when you are tired. GET HELP RIGHT AWAY IF:   Your knee pain does not stop.  Your knee pain does not get better.  Your knee joint feels hot to the touch.  You have a  fever. MAKE SURE YOU:   Understand these instructions.  Will watch this condition.  Will get help right away if you are not doing well or get worse. Document Released: 01/10/2009 Document Revised: 01/06/2012 Document Reviewed: 01/10/2009 [facility] Patient Information 2014 [place], [place].  Trichomoniasis Trichomoniasis is an infection, caused by the Trichomonas organism, that affects both women and men. In women, the outer female genitalia and the vagina are affected. In men, the penis is mainly affected, but the prostate and other reproductive organs can also be involved. Trichomoniasis is a sexually transmitted disease (STD) and is most often passed to another person through sexual contact. The majority of people who get trichomoniasis do so from a sexual encounter and are also at risk for other STDs. CAUSES   Sexual intercourse with an infected partner.  It can be present in swimming pools or hot tubs. SYMPTOMS   Abnormal gray-green frothy vaginal discharge in women.  Vaginal itching and irritation in women.  Itching and irritation of the area outside the vagina in women.  Penile discharge with or without  pain in males.  Inflammation of the urethra (urethritis), causing painful urination.  Bleeding after sexual intercourse. RELATED COMPLICATIONS  Pelvic inflammatory disease.  Infection of the uterus (endometritis).  Infertility.  Tubal (ectopic) pregnancy.  It can be associated with other STDs, including gonorrhea and chlamydia, hepatitis B, and HIV. COMPLICATIONS DURING PREGNANCY  Early (premature) delivery.  Premature rupture of the membranes (PROM).  Low birth weight. DIAGNOSIS   Visualization of Trichomonas under the microscope from the vagina discharge.  Ph of the vagina greater than 4.5, tested with a test tape.  Trich Rapid Test.  Culture of the organism, but this is not usually needed.  It may be found on a Pap test.  Having a "strawberry  cervix,"which means the cervix looks very red like a strawberry. TREATMENT   You may be given medication to fight the infection. Inform your caregiver if you could be or are pregnant. Some medications used to treat the infection should not be taken during pregnancy.  Over-the-counter medications or creams to decrease itching or irritation may be recommended.  Your sexual partner will need to be treated if infected. HOME CARE INSTRUCTIONS   Take all medication prescribed by your caregiver.  Take over-the-counter medication for itching or irritation as directed by your caregiver.  Do not have sexual intercourse while you have the infection.  Do not douche or wear tampons.  Discuss your infection with your partner, as your partner may have acquired the infection from you. Or, your partner may have been the person who transmitted the infection to you.  Have your sex partner examined and treated if necessary.  Practice safe, informed, and protected sex.  See your caregiver for other STD testing. SEEK MEDICAL CARE IF:   You still have symptoms after you finish the medication.  You have an oral temperature above 102 F (38.9 C).  You develop belly (abdominal) pain.  You have pain when you urinate.  You have bleeding after sexual intercourse.  You develop a rash.  The medication makes you sick or makes you throw up (vomit). Document Released: 04/09/2001 Document Revised: 01/06/2012 Document Reviewed: 05/05/2009 [facility] Patient Information 2014 [place], [place].  Ankle Pain Ankle pain is a common symptom. The bones, cartilage, tendons, and muscles of the ankle joint perform a lot of work each day. The ankle joint holds your body weight and allows you to move around. Ankle pain can occur on either side or back of 1 or both ankles. Ankle pain may be sharp and burning or dull and aching. There may be tenderness, stiffness, redness, or warmth around the ankle. The pain occurs more  often when a person walks or puts pressure on the ankle. CAUSES  There are many reasons ankle pain can develop. It is important to work with your caregiver to identify the cause since many conditions can impact the bones, cartilage, muscles, and tendons. Causes for ankle pain include:  Injury, including a break (fracture), sprain, or strain often due to a fall, sports, or a high-impact activity.  Swelling (inflammation) of a tendon (tendonitis).  Achilles tendon rupture.  Ankle instability after repeated sprains and strains.  Poor foot alignment.  Pressure on a nerve (tarsal tunnel syndrome).  Arthritis in the ankle or the lining of the ankle.  Crystal formation in the ankle (gout or pseudogout). DIAGNOSIS  A diagnosis is based on your medical history, your symptoms, results of your physical exam, and results of diagnostic tests. Diagnostic tests may include X-ray exams or a computerized  magnetic scan (magnetic resonance imaging, MRI). TREATMENT  Treatment will depend on the cause of your ankle pain and may include:  Keeping pressure off the ankle and limiting activities.  Using crutches or other walking support (a cane or brace).  Using rest, ice, compression, and elevation.  Participating in physical therapy or home exercises.  Wearing shoe inserts or special shoes.  Losing weight.  Taking medications to reduce pain or swelling or receiving an injection.  Undergoing surgery. HOME CARE INSTRUCTIONS   Only take over-the-counter or prescription medicines for pain, discomfort, or fever as directed by your caregiver.  Put ice on the injured area.  Put ice in a plastic bag.  Place a towel between your skin and the bag.  Leave the ice on for 15-20 minutes at a time, 03-04 times a day.  Keep your leg raised (elevated) when possible to lessen swelling.  Avoid activities that cause ankle pain.  Follow specific exercises as directed by your caregiver.  Record how often  you have ankle pain, the location of the pain, and what it feels like. This information may be helpful to you and your caregiver.  Ask your caregiver about returning to work or sports and whether you should drive.  Follow up with your caregiver for further examination, therapy, or testing as directed. SEEK MEDICAL CARE IF:   Pain or swelling continues or worsens beyond 1 week.  You have an oral temperature above 102 F (38.9 C).  You are feeling unwell or have chills.  You are having an increasingly difficult time with walking.  You have loss of sensation or other new symptoms.  You have questions or concerns. MAKE SURE YOU:   Understand these instructions.  Will watch your condition.  Will get help right away if you are not doing well or get worse. Document Released: 04/03/2010 Document Revised: 01/06/2012 Document Reviewed: 04/03/2010 [facility] Patient Information 2014 [place].

## 2014-02-26 LAB — GC/CHLAMYDIA PROBE AMP: CT PROBE, AMP APTIMA: UNDETERMINED

## 2014-03-04 DIAGNOSIS — M2040 Other hammer toe(s) (acquired), unspecified foot: Secondary | ICD-10-CM | POA: Insufficient documentation

## 2014-03-04 DIAGNOSIS — M19079 Primary osteoarthritis, unspecified ankle and foot: Secondary | ICD-10-CM | POA: Insufficient documentation

## 2014-06-10 ENCOUNTER — Other Ambulatory Visit: Payer: Self-pay

## 2014-06-10 ENCOUNTER — Emergency Department (HOSPITAL_COMMUNITY)
Admission: EM | Admit: 2014-06-10 | Discharge: 2014-06-10 | Disposition: A | Discharge status: Home / Self Care | Payer: No Typology Code available for payment source | Attending: Emergency Medicine | Admitting: Emergency Medicine

## 2014-06-10 ENCOUNTER — Emergency Department (HOSPITAL_COMMUNITY): Payer: No Typology Code available for payment source

## 2014-06-10 ENCOUNTER — Encounter (HOSPITAL_COMMUNITY): Payer: Self-pay | Admitting: Emergency Medicine

## 2014-06-10 DIAGNOSIS — I10 Essential (primary) hypertension: Secondary | ICD-10-CM | POA: Insufficient documentation

## 2014-06-10 DIAGNOSIS — F3130 Bipolar disorder, current episode depressed, mild or moderate severity, unspecified: Secondary | ICD-10-CM | POA: Insufficient documentation

## 2014-06-10 DIAGNOSIS — Z791 Long term (current) use of non-steroidal anti-inflammatories (NSAID): Secondary | ICD-10-CM | POA: Insufficient documentation

## 2014-06-10 DIAGNOSIS — M25569 Pain in unspecified knee: Secondary | ICD-10-CM | POA: Insufficient documentation

## 2014-06-10 DIAGNOSIS — J45909 Unspecified asthma, uncomplicated: Secondary | ICD-10-CM | POA: Insufficient documentation

## 2014-06-10 DIAGNOSIS — G8929 Other chronic pain: Secondary | ICD-10-CM | POA: Insufficient documentation

## 2014-06-10 DIAGNOSIS — M545 Low back pain, unspecified: Secondary | ICD-10-CM | POA: Insufficient documentation

## 2014-06-10 DIAGNOSIS — F17200 Nicotine dependence, unspecified, uncomplicated: Secondary | ICD-10-CM | POA: Insufficient documentation

## 2014-06-10 DIAGNOSIS — K219 Gastro-esophageal reflux disease without esophagitis: Secondary | ICD-10-CM | POA: Insufficient documentation

## 2014-06-10 DIAGNOSIS — Z79899 Other long term (current) drug therapy: Secondary | ICD-10-CM | POA: Insufficient documentation

## 2014-06-10 DIAGNOSIS — M62830 Muscle spasm of back: Secondary | ICD-10-CM

## 2014-06-10 DIAGNOSIS — R0789 Other chest pain: Secondary | ICD-10-CM | POA: Insufficient documentation

## 2014-06-10 DIAGNOSIS — Z1231 Encounter for screening mammogram for malignant neoplasm of breast: Secondary | ICD-10-CM

## 2014-06-10 DIAGNOSIS — Z862 Personal history of diseases of the blood and blood-forming organs and certain disorders involving the immune mechanism: Secondary | ICD-10-CM | POA: Insufficient documentation

## 2014-06-10 DIAGNOSIS — M5380 Other specified dorsopathies, site unspecified: Secondary | ICD-10-CM | POA: Insufficient documentation

## 2014-06-10 DIAGNOSIS — M25579 Pain in unspecified ankle and joints of unspecified foot: Secondary | ICD-10-CM | POA: Insufficient documentation

## 2014-06-10 DIAGNOSIS — Z8639 Personal history of other endocrine, nutritional and metabolic disease: Secondary | ICD-10-CM | POA: Insufficient documentation

## 2014-06-10 LAB — COMPREHENSIVE METABOLIC PANEL
ALT: 10 U/L (ref 0–35)
ANION GAP: 13 (ref 5–15)
AST: 14 U/L (ref 0–37)
Albumin: 3.2 g/dL — ABNORMAL LOW (ref 3.5–5.2)
Alkaline Phosphatase: 78 U/L (ref 39–117)
BILIRUBIN TOTAL: 0.2 mg/dL — AB (ref 0.3–1.2)
BUN: 6 mg/dL (ref 6–23)
CO2: 26 mEq/L (ref 19–32)
Calcium: 8.9 mg/dL (ref 8.4–10.5)
Chloride: 102 mEq/L (ref 96–112)
Creatinine, Ser: 0.74 mg/dL (ref 0.50–1.10)
GFR calc Af Amer: >90 mL/min (ref >90)
GFR calc non Af Amer: >90 mL/min (ref >90)
Glucose, Bld: 122 mg/dL — ABNORMAL HIGH (ref 70–99)
Potassium: 3.6 mEq/L — ABNORMAL LOW (ref 3.7–5.3)
Sodium: 141 mEq/L (ref 137–147)
Total Protein: 8.2 g/dL (ref 6.0–8.3)

## 2014-06-10 LAB — CBC
HEMATOCRIT: 34.4 % — AB (ref 36.0–46.0)
Hemoglobin: 11.5 g/dL — ABNORMAL LOW (ref 12.0–15.0)
MCH: 29.7 pg (ref 26.0–34.0)
MCHC: 33.4 g/dL (ref 30.0–36.0)
MCV: 88.9 fL (ref 78.0–100.0)
Platelets: 206 10*3/uL (ref 150–400)
RBC: 3.87 MIL/uL (ref 3.87–5.11)
RDW: 15.4 % (ref 11.5–15.5)
WBC: 6.9 10*3/uL (ref 4.0–10.5)

## 2014-06-10 LAB — I-STAT TROPONIN, ED: Troponin i, poc: 0 ng/mL (ref 0.00–0.08)

## 2014-06-10 MED ORDER — CYCLOBENZAPRINE HCL 10 MG PO TABS
5.0000 mg | ORAL_TABLET | Freq: Once | ORAL | Status: AC
Start: 2014-06-10 — End: 2014-06-10
  Administered 2014-06-10: 5 mg via ORAL
  Filled 2014-06-10: qty 1

## 2014-06-10 MED ORDER — IPRATROPIUM-ALBUTEROL 0.5-2.5 (3) MG/3ML IN SOLN
3.0000 mL | Freq: Once | RESPIRATORY_TRACT | Status: AC
  Administered 2014-06-10: 3 mL via RESPIRATORY_TRACT
  Filled 2014-06-10: qty 3

## 2014-06-10 MED ORDER — ORPHENADRINE CITRATE ER 100 MG PO TB12
100.0000 mg | ORAL_TABLET | Freq: Once | ORAL | Status: DC
  Filled 2014-06-10: qty 1

## 2014-06-10 MED ORDER — KETOROLAC TROMETHAMINE 60 MG/2ML IM SOLN
60.0000 mg | Freq: Once | INTRAMUSCULAR | Status: AC
  Administered 2014-06-10: 60 mg via INTRAMUSCULAR
  Filled 2014-06-10: qty 2

## 2014-06-10 MED ORDER — ORPHENADRINE CITRATE ER 100 MG PO TB12: 100.0000 mg | ORAL_TABLET | Freq: Two times a day (BID) | ORAL | Status: DC

## 2014-06-10 NOTE — ED Notes (Signed)
Back pain left ankle and knee pain a bad cough x 4 weeks and chest pain for a few days shooting pain

## 2014-06-10 NOTE — ED Notes (Signed)
Breathing tx finished, "feeling better", given happy meal per request, pending meds from pharmacy, alert, NAD, calm, interactive, no dyspnea noted.

## 2014-06-10 NOTE — ED Provider Notes (Signed)
CSN: [id number]     Arrival date & time 06/10/14  1448 History   First MD Initiated Contact with Patient 06/10/14 1815     Chief Complaint  Patient presents with  . Cough  . Back Pain   Alicia Blake is a 48 yo AAF w/PMH of chronic back pain, HTN, asthma, GERD, and thyroid disease who presents today with several complaints. Patient has chronic low back pain that has become worse this week after lifting some heavy boxes. Patient feels like she has a muscle spasm. Pain is located in her left lower back. It does not radiate down her left leg. Pain is worse with moving. Patient also complains of left ankle and left knee pain since her back began hurting. Patient notes that she's had a fracture in her left lower extremity before that is healed improperly and is prone to having ankle and knee pain. Patient also feels like she has chest tightness and wheezing. She is out of her albuterol inhaler.  She denies saddle anesthesia, bowel/bladder incontinence or retention, CP, SOB, fever, chills, N/V, diarrhea, constipation, hematemesis, dysuria, hematuria, sick contacts, or recent travel.   (Consider location/radiation/quality/duration/timing/severity/associated sxs/prior Treatment) Patient is a 48 y.o. female presenting with back pain.  Back Pain Location:  Lumbar spine Quality:  Aching Radiates to:  Does not radiate Pain severity:  Moderate Onset quality:  Sudden Duration:  1 week Timing:  Constant Progression:  Unchanged Chronicity:  Chronic Context: lifting heavy objects   Relieved by:  Being still Worsened by:  Movement Associated symptoms: no abdominal pain, no bladder incontinence, no bowel incontinence, no chest pain, no dysuria, no fever, no headaches, no leg pain, no numbness, no paresthesias, no perianal numbness, no tingling and no weakness     Past Medical History  Diagnosis Date  . Hypertension   . Asthma   . Bronchitis   . GERD (gastroesophageal reflux disease)   . Thyroid  disease   . PTSD (post-traumatic stress disorder)   . Bipolar affect, depressed   . Chronic back pain    Past Surgical History  Procedure Laterality Date  . Neck surgery    . Abdominal surgery     Family History  Problem Relation Age of Onset  . Hypertension Mother   . Hypertension Maternal Grandmother   . Hypertension Paternal Grandmother   . Diabetes Paternal Grandmother   . Hypertension Paternal Grandfather   . Diabetes Paternal Grandfather    History  Substance Use Topics  . Smoking status: Current Every Day Smoker -- 0.25 packs/day    Types: Cigarettes  . Smokeless tobacco: Not on file  . Alcohol Use: Yes     Comment: quit date 01/17/2013   OB History   Grav Para Term Preterm Abortions TAB SAB Ect Mult Living   1 1 1       1      Review of Systems  Constitutional: Negative for fever and chills.  Respiratory: Positive for chest tightness and wheezing. Negative for shortness of breath.   Cardiovascular: Negative for chest pain, palpitations and leg swelling.  Gastrointestinal: Negative for nausea, vomiting, abdominal pain, diarrhea, constipation, abdominal distention and bowel incontinence.  Genitourinary: Negative for bladder incontinence, dysuria, frequency, flank pain and decreased urine volume.  Musculoskeletal: Positive for arthralgias (left knee and ankle) and back pain (left lower). Negative for gait problem, joint swelling, myalgias, neck pain and neck stiffness.  Skin: Negative for wound.  Neurological: Negative for dizziness, tingling, speech difficulty, weakness, light-headedness, numbness, headaches  and paresthesias.  All other systems reviewed and are negative.     Allergies  Crab and Lithium  Home Medications   Prior to Admission medications   Medication Sig Start Date End Date Taking? Authorizing Provider  albuterol (PROVENTIL HFA;VENTOLIN HFA) 108 (90 BASE) MCG/ACT inhaler Inhale 2 puffs into the lungs every 6 (six) hours as needed for wheezing.    Yes Historical Provider, MD  albuterol (PROVENTIL) (2.5 MG/3ML) 0.083% nebulizer solution Take 2.5 mg by nebulization every 6 (six) hours as needed for wheezing.   Yes Historical Provider, MD  Fluticasone-Salmeterol (ADVAIR) 250-50 MCG/DOSE AEPB Inhale 1 puff into the lungs daily as needed. For shortness of breath   Yes Historical Provider, MD  furosemide (LASIX) 20 MG tablet Take 20 mg by mouth.   Yes Historical Provider, MD  lisinopril (PRINIVIL,ZESTRIL) 10 MG tablet Take 10 mg by mouth daily.   Yes Historical Provider, MD  meloxicam (MOBIC) 15 MG tablet Take 1 tablet (15 mg total) by mouth daily. 02/25/14  Yes Kalman Drape, MD  OLANZapine (ZYPREXA) 15 MG tablet Take 15 mg by mouth at bedtime.    Yes Historical Provider, MD  omeprazole (PRILOSEC) 20 MG capsule Take 20 mg by mouth daily.   Yes Historical Provider, MD  tiotropium (SPIRIVA) 18 MCG inhalation capsule Place 18 mcg into inhaler and inhale daily as needed. For shortness of breath   Yes Historical Provider, MD  loratadine (CLARITIN) 10 MG tablet Take 10 mg by mouth daily.    Historical Provider, MD  orphenadrine (NORFLEX) 100 MG tablet Take 1 tablet (100 mg total) by mouth 2 (two) times daily. 06/10/14   Sherian Maroon, MD   BP 119/63  Pulse 99  Temp(Src) 98.5 F (36.9 C) (Oral)  Resp 16  Wt 234 lb (106.142 kg)  SpO2 98%  LMP 07/14/2013 Physical Exam  Nursing note and vitals reviewed. Constitutional: She is oriented to person, place, and time. She appears well-developed and well-nourished. No distress.  HENT:  Head: Normocephalic and atraumatic.  Cardiovascular: Normal rate, regular rhythm, normal heart sounds and intact distal pulses.  Exam reveals no gallop and no friction rub.   No murmur heard. Pulmonary/Chest: Effort normal and breath sounds normal. No respiratory distress. She has no wheezes. She has no rales. She exhibits no tenderness.  Abdominal: Soft. Bowel sounds are normal. She exhibits no distension and no mass. There is  no tenderness. There is no rebound and no guarding.  Musculoskeletal: She exhibits no edema and no tenderness.  No decreased ROM, no crepitus.   Lymphadenopathy:    She has no cervical adenopathy.  Neurological: She is alert and oriented to person, place, and time.  Skin: Skin is warm and dry. No rash noted. She is not diaphoretic. No erythema.    ED Course  Procedures (including critical care time) Labs Review Labs Reviewed  CBC - Abnormal; Notable for the following:    Hemoglobin 11.5 (*)    HCT 34.4 (*)    All other components within normal limits  COMPREHENSIVE METABOLIC PANEL - Abnormal; Notable for the following:    Potassium 3.6 (*)    Glucose, Bld 122 (*)    Albumin 3.2 (*)    Total Bilirubin 0.2 (*)    All other components within normal limits  Randolm Idol, ED    Imaging Review Dg Chest 2 View  06/10/2014   CLINICAL DATA:  48 year old female with cough and back pain. Initial encounter.  EXAM: CHEST  2 VIEW  COMPARISON:  05/12/2013.  FINDINGS: Large body habitus. Stable and normal lung volumes. Normal cardiac size and mediastinal contours. Visualized tracheal air column is within normal limits. Lung parenchyma stable and clear. Stable mild elevation of the right hemidiaphragm. No acute osseous abnormality identified.  IMPRESSION: Negative, no acute cardiopulmonary abnormality.   Electronically Signed   By: Lars Pinks M.D.   On: 06/10/2014 15:35     EKG Interpretation None      MDM   48 yo AAF w/low back pain. Please see HPI for details. On exam, pt in NAD, AFVSS. Patient has wheezing on lung exam. Will give patient Duoneb treatment prior to discharge. Left ankle and knee show no signs of infection or effusion. No crepitus on range of motion. No swelling. No redness or warmth. Patient's left lower back pain is not tender to palpation. No skin lesions or fluctuance to the area. No sciatica on exam. Patient has normal range of motion at waist.  Labs wnl.  Suspect  muscle strain from lifting heavy objects. She was given Flexeril here and encouraged to take Motrin 600 every 6 hours as needed. After DuoNeb treatment patient's wheezing greatly diminished. Chest x-ray shows no focal consolidations concerning for pneumonia. Stable for discharge home. Strict return precautions include cauda equina sx, or fever/chills, joint swelling w/redness or warmth, or skin changes concerning for infection.  Final diagnoses:  Left-sided low back pain without sciatica  Muscle spasm of back    Pt was seen under the supervision of Dr. Alvino Chapel.     Sherian Maroon, MD 06/11/14 [phone]

## 2014-06-10 NOTE — Discharge Instructions (Signed)

## 2014-06-11 NOTE — ED Provider Notes (Signed)
I saw and evaluated the patient, reviewed the resident's note and I agree with the findings and plan.   EKG Interpretation None      patient with back pain. Likely due to recent lifting heavy objects. Will discharge home. Has had cough. Improved with treatment. Negative x-ray  Jasper Riling. Alvino Chapel, MD 06/11/14 1454

## 2014-07-15 ENCOUNTER — Ambulatory Visit
Admission: RE | Admit: 2014-07-15 | Discharge: 2014-07-15 | Disposition: A | Discharge status: Home / Self Care | Payer: No Typology Code available for payment source | Source: Ambulatory Visit

## 2014-07-15 DIAGNOSIS — Z1231 Encounter for screening mammogram for malignant neoplasm of breast: Secondary | ICD-10-CM

## 2014-08-29 ENCOUNTER — Encounter (HOSPITAL_COMMUNITY): Payer: Self-pay | Admitting: Emergency Medicine

## 2014-12-07 ENCOUNTER — Encounter: Payer: Self-pay | Admitting: *Deleted

## 2014-12-15 ENCOUNTER — Encounter: Payer: Self-pay | Admitting: *Deleted

## 2014-12-27 HISTORY — PX: OTHER SURGICAL HISTORY: SHX169

## 2015-01-05 ENCOUNTER — Encounter: Payer: Self-pay | Admitting: Obstetrics & Gynecology

## 2015-01-30 ENCOUNTER — Ambulatory Visit (INDEPENDENT_AMBULATORY_CARE_PROVIDER_SITE_OTHER): Payer: Self-pay | Admitting: Obstetrics & Gynecology

## 2015-01-30 ENCOUNTER — Encounter: Payer: Self-pay | Admitting: Obstetrics & Gynecology

## 2015-01-30 VITALS — BP 121/81 | HR 107 | Ht 66.0 in | Wt 245.0 lb

## 2015-01-30 DIAGNOSIS — D259 Leiomyoma of uterus, unspecified: Secondary | ICD-10-CM

## 2015-01-30 DIAGNOSIS — N83209 Unspecified ovarian cyst, unspecified side: Secondary | ICD-10-CM

## 2015-01-30 DIAGNOSIS — N8320 Unspecified ovarian cysts: Secondary | ICD-10-CM

## 2015-01-30 NOTE — Patient Instructions (Signed)

## 2015-01-30 NOTE — Progress Notes (Signed)
Subjective:     Patient ID: Alicia Blake, female   DOB: 12-Jan-1966, 49 y.o.   MRN: [id number]  HPI G1P1 LMP 'none this year maybe Nov or Dec 2015'  Pt denies abd or pelvic pain.  Pts last PAP was 2015 and was normal.  She was referred for for eval of sono done in 2014 which showed fibroids and an 'cyst on my overy that I think popped.'  She denies sx. Pt reports hot flushes that are not severe.     Past Medical History  Diagnosis Date  . Hypertension   . Asthma   . Bronchitis   . GERD (gastroesophageal reflux disease)   . Thyroid disease   . PTSD (post-traumatic stress disorder)   . Bipolar affect, depressed   . Chronic back pain    Past Surgical History  Procedure Laterality Date  . Neck surgery    . Abdominal surgery     Current Outpatient Prescriptions on File Prior to Visit  Medication Sig Dispense Refill  . albuterol (PROVENTIL HFA;VENTOLIN HFA) 108 (90 BASE) MCG/ACT inhaler Inhale 2 puffs into the lungs every 6 (six) hours as needed for wheezing.    Marland Kitchen albuterol (PROVENTIL) (2.5 MG/3ML) 0.083% nebulizer solution Take 2.5 mg by nebulization every 6 (six) hours as needed for wheezing.    . Fluticasone-Salmeterol (ADVAIR) 250-50 MCG/DOSE AEPB Inhale 1 puff into the lungs daily as needed. For shortness of breath    . furosemide (LASIX) 20 MG tablet Take 20 mg by mouth.    Marland Kitchen lisinopril (PRINIVIL,ZESTRIL) 10 MG tablet Take 10 mg by mouth daily.    Marland Kitchen loratadine (CLARITIN) 10 MG tablet Take 10 mg by mouth daily.    . meloxicam (MOBIC) 15 MG tablet Take 1 tablet (15 mg total) by mouth daily. 30 tablet 0  . omeprazole (PRILOSEC) 20 MG capsule Take 20 mg by mouth daily.    Marland Kitchen tiotropium (SPIRIVA) 18 MCG inhalation capsule Place 18 mcg into inhaler and inhale daily as needed. For shortness of breath    . OLANZapine (ZYPREXA) 15 MG tablet Take 15 mg by mouth at bedtime.     . orphenadrine (NORFLEX) 100 MG tablet Take 1 tablet (100 mg total) by mouth 2 (two) times daily. (Patient not taking:  Reported on 01/30/2015) 15 tablet 0   No current facility-administered medications on file prior to visit.   Allergies  Allergen Reactions  . Crab [Shellfish Allergy] Swelling    fever  . Lithium Other (See Comments)    Speech impairment      Review of SystemsNC     Objective:   Physical Exam BP 121/81 mmHg  Pulse 107  Ht 5\' 6"  (1.676 m)  Wt 245 lb (111.131 kg)  BMI 39.56 kg/m2  LMP 07/14/2013  Pt in NAD Lungs: CTA CV: RRR Abd: obese, NT, ND.  Well healed vertical incision in upper abd GU: EGBUS: no lesions Vagina: no blood in vault Cervix: no lesion; no mucopurulent d/c; NO CMT Uterus: 12 weeks sized, mobile.  Non tender Adnexa: no masses; non tender      Assessment:     Uterine fibroids- asymptomatic. Pt educated regarding the natural history of fibroids with monopause.  All questions answered     Plan:     Repeat PAP in 2017 F/u sooner prn sx

## 2015-02-07 ENCOUNTER — Encounter (HOSPITAL_COMMUNITY): Payer: Self-pay

## 2015-02-07 ENCOUNTER — Emergency Department (HOSPITAL_COMMUNITY): Payer: Self-pay

## 2015-02-07 ENCOUNTER — Emergency Department (HOSPITAL_COMMUNITY)
Admission: EM | Admit: 2015-02-07 | Discharge: 2015-02-07 | Discharge status: Against Medical Advice | Payer: Self-pay | Attending: Emergency Medicine | Admitting: Emergency Medicine

## 2015-02-07 DIAGNOSIS — Z720 Tobacco use: Secondary | ICD-10-CM | POA: Insufficient documentation

## 2015-02-07 DIAGNOSIS — G8929 Other chronic pain: Secondary | ICD-10-CM | POA: Insufficient documentation

## 2015-02-07 DIAGNOSIS — J45901 Unspecified asthma with (acute) exacerbation: Secondary | ICD-10-CM | POA: Insufficient documentation

## 2015-02-07 DIAGNOSIS — I10 Essential (primary) hypertension: Secondary | ICD-10-CM | POA: Insufficient documentation

## 2015-02-07 LAB — BASIC METABOLIC PANEL
Anion gap: 8 (ref 5–15)
BUN: 8 mg/dL (ref 6–23)
CALCIUM: 8.7 mg/dL (ref 8.4–10.5)
CO2: 28 mmol/L (ref 19–32)
CREATININE: 0.95 mg/dL (ref 0.50–1.10)
Chloride: 100 mmol/L (ref 96–112)
GFR calc Af Amer: 80 mL/min — ABNORMAL LOW (ref >90)
GFR calc non Af Amer: 69 mL/min — ABNORMAL LOW (ref >90)
GLUCOSE: 93 mg/dL (ref 70–99)
Potassium: 3.2 mmol/L — ABNORMAL LOW (ref 3.5–5.1)
SODIUM: 136 mmol/L (ref 135–145)

## 2015-02-07 LAB — CBC
HCT: 35 % — ABNORMAL LOW (ref 36.0–46.0)
Hemoglobin: 11.5 g/dL — ABNORMAL LOW (ref 12.0–15.0)
MCH: 28.2 pg (ref 26.0–34.0)
MCHC: 32.9 g/dL (ref 30.0–36.0)
MCV: 85.8 fL (ref 78.0–100.0)
Platelets: 231 10*3/uL (ref 150–400)
RBC: 4.08 MIL/uL (ref 3.87–5.11)
RDW: 15.4 % (ref 11.5–15.5)
WBC: 4.8 10*3/uL (ref 4.0–10.5)

## 2015-02-07 LAB — I-STAT TROPONIN, ED: TROPONIN I, POC: 0 ng/mL (ref 0.00–0.08)

## 2015-02-07 MED ORDER — OXYCODONE-ACETAMINOPHEN 5-325 MG PO TABS
ORAL_TABLET | ORAL | Status: AC
  Filled 2015-02-07: qty 1

## 2015-02-07 MED ORDER — OXYCODONE-ACETAMINOPHEN 5-325 MG PO TABS
1.0000 | ORAL_TABLET | Freq: Once | ORAL | Status: AC
  Administered 2015-02-07: 1 via ORAL

## 2015-02-07 NOTE — ED Notes (Signed)
Called for pt. In the waiting room, pt. Did not answer.

## 2015-02-07 NOTE — ED Notes (Signed)
Called for pt. In the waiting room,  Pt. Did not answer

## 2015-02-07 NOTE — ED Notes (Signed)
Called for pt. To do vitals, pt. Is not in the Waiting area

## 2015-02-07 NOTE — ED Notes (Signed)
Pt was smoking and started having chest pain. Pt tried breathing into a paper bag to help her breathing. Has asthma and also used her inhaler to help as well. Here for that and pain in her lower back, and right leg.

## 2015-08-08 ENCOUNTER — Emergency Department (HOSPITAL_COMMUNITY)
Admission: EM | Admit: 2015-08-08 | Discharge: 2015-08-08 | Disposition: A | Discharge status: Home / Self Care | Payer: Medicaid Other | Attending: Emergency Medicine | Admitting: Emergency Medicine

## 2015-08-08 ENCOUNTER — Encounter (HOSPITAL_COMMUNITY): Payer: Self-pay | Admitting: Emergency Medicine

## 2015-08-08 DIAGNOSIS — Z720 Tobacco use: Secondary | ICD-10-CM | POA: Diagnosis not present

## 2015-08-08 DIAGNOSIS — I10 Essential (primary) hypertension: Secondary | ICD-10-CM | POA: Diagnosis not present

## 2015-08-08 DIAGNOSIS — N898 Other specified noninflammatory disorders of vagina: Secondary | ICD-10-CM

## 2015-08-08 DIAGNOSIS — J45909 Unspecified asthma, uncomplicated: Secondary | ICD-10-CM | POA: Insufficient documentation

## 2015-08-08 DIAGNOSIS — G8929 Other chronic pain: Secondary | ICD-10-CM

## 2015-08-08 DIAGNOSIS — M545 Low back pain: Secondary | ICD-10-CM | POA: Insufficient documentation

## 2015-08-08 DIAGNOSIS — R220 Localized swelling, mass and lump, head: Secondary | ICD-10-CM

## 2015-08-08 DIAGNOSIS — F3130 Bipolar disorder, current episode depressed, mild or moderate severity, unspecified: Secondary | ICD-10-CM | POA: Diagnosis not present

## 2015-08-08 DIAGNOSIS — Z79899 Other long term (current) drug therapy: Secondary | ICD-10-CM | POA: Insufficient documentation

## 2015-08-08 DIAGNOSIS — Z3202 Encounter for pregnancy test, result negative: Secondary | ICD-10-CM | POA: Insufficient documentation

## 2015-08-08 DIAGNOSIS — Z8639 Personal history of other endocrine, nutritional and metabolic disease: Secondary | ICD-10-CM | POA: Diagnosis not present

## 2015-08-08 DIAGNOSIS — M549 Dorsalgia, unspecified: Secondary | ICD-10-CM

## 2015-08-08 DIAGNOSIS — R1030 Lower abdominal pain, unspecified: Secondary | ICD-10-CM | POA: Diagnosis present

## 2015-08-08 DIAGNOSIS — K219 Gastro-esophageal reflux disease without esophagitis: Secondary | ICD-10-CM | POA: Insufficient documentation

## 2015-08-08 DIAGNOSIS — F4310 Post-traumatic stress disorder, unspecified: Secondary | ICD-10-CM | POA: Diagnosis not present

## 2015-08-08 DIAGNOSIS — Z7951 Long term (current) use of inhaled steroids: Secondary | ICD-10-CM | POA: Diagnosis not present

## 2015-08-08 LAB — COMPREHENSIVE METABOLIC PANEL
ALT: 16 U/L (ref 14–54)
AST: 19 U/L (ref 15–41)
Albumin: 3.3 g/dL — ABNORMAL LOW (ref 3.5–5.0)
Alkaline Phosphatase: 89 U/L (ref 38–126)
Anion gap: 11 (ref 5–15)
BUN: 19 mg/dL (ref 6–20)
CHLORIDE: 99 mmol/L — AB (ref 101–111)
CO2: 23 mmol/L (ref 22–32)
CREATININE: 1.16 mg/dL — AB (ref 0.44–1.00)
Calcium: 8.9 mg/dL (ref 8.9–10.3)
GFR, EST NON AFRICAN AMERICAN: 54 mL/min — AB (ref >60)
Glucose, Bld: 122 mg/dL — ABNORMAL HIGH (ref 65–99)
POTASSIUM: 3.8 mmol/L (ref 3.5–5.1)
SODIUM: 133 mmol/L — AB (ref 135–145)
Total Bilirubin: 0.5 mg/dL (ref 0.3–1.2)
Total Protein: 7.9 g/dL (ref 6.5–8.1)

## 2015-08-08 LAB — URINALYSIS, ROUTINE W REFLEX MICROSCOPIC
Bilirubin Urine: NEGATIVE
Glucose, UA: NEGATIVE mg/dL
Hgb urine dipstick: NEGATIVE
Ketones, ur: NEGATIVE mg/dL
LEUKOCYTES UA: NEGATIVE
NITRITE: NEGATIVE
PROTEIN: NEGATIVE mg/dL
SPECIFIC GRAVITY, URINE: 1.016 (ref 1.005–1.030)
UROBILINOGEN UA: 0.2 mg/dL (ref 0.0–1.0)
pH: 5 (ref 5.0–8.0)

## 2015-08-08 LAB — WET PREP, GENITAL
TRICH WET PREP: NONE SEEN
YEAST WET PREP: NONE SEEN

## 2015-08-08 LAB — CBC WITH DIFFERENTIAL/PLATELET
BASOS ABS: 0 10*3/uL (ref 0.0–0.1)
Basophils Relative: 0 %
EOS ABS: 0.2 10*3/uL (ref 0.0–0.7)
EOS PCT: 2 %
HCT: 36.4 % (ref 36.0–46.0)
Hemoglobin: 12.4 g/dL (ref 12.0–15.0)
LYMPHS ABS: 2 10*3/uL (ref 0.7–4.0)
Lymphocytes Relative: 28 %
MCH: 30.1 pg (ref 26.0–34.0)
MCHC: 34.1 g/dL (ref 30.0–36.0)
MCV: 88.3 fL (ref 78.0–100.0)
MONO ABS: 0.5 10*3/uL (ref 0.1–1.0)
Monocytes Relative: 7 %
Neutro Abs: 4.3 10*3/uL (ref 1.7–7.7)
Neutrophils Relative %: 63 %
PLATELETS: 215 10*3/uL (ref 150–400)
RBC: 4.12 MIL/uL (ref 3.87–5.11)
RDW: 15.6 % — AB (ref 11.5–15.5)
WBC: 6.9 10*3/uL (ref 4.0–10.5)

## 2015-08-08 LAB — POC URINE PREG, ED: PREG TEST UR: NEGATIVE

## 2015-08-08 LAB — POC OCCULT BLOOD, ED: Fecal Occult Bld: NEGATIVE

## 2015-08-08 MED ORDER — DOXYCYCLINE HYCLATE 100 MG PO CAPS: 100.0000 mg | ORAL_CAPSULE | Freq: Two times a day (BID) | ORAL | Status: DC

## 2015-08-08 MED ORDER — DIPHENHYDRAMINE HCL 25 MG PO CAPS: 25.0000 mg | ORAL_CAPSULE | Freq: Four times a day (QID) | ORAL | Status: DC | PRN

## 2015-08-08 MED ORDER — LIDOCAINE HCL (PF) 1 % IJ SOLN
0.9000 mL | Freq: Once | INTRAMUSCULAR | Status: AC
  Administered 2015-08-08: 0.9 mL
  Filled 2015-08-08: qty 5

## 2015-08-08 MED ORDER — IBUPROFEN 800 MG PO TABS: 800.0000 mg | ORAL_TABLET | Freq: Three times a day (TID) | ORAL | Status: DC

## 2015-08-08 MED ORDER — KETOROLAC TROMETHAMINE 30 MG/ML IJ SOLN
30.0000 mg | Freq: Once | INTRAMUSCULAR | Status: DC
  Filled 2015-08-08: qty 1

## 2015-08-08 MED ORDER — AZITHROMYCIN 250 MG PO TABS
1000.0000 mg | ORAL_TABLET | Freq: Once | ORAL | Status: AC
  Administered 2015-08-08: 1000 mg via ORAL
  Filled 2015-08-08: qty 4

## 2015-08-08 MED ORDER — ACETAMINOPHEN 500 MG PO TABS: 500.0000 mg | ORAL_TABLET | Freq: Four times a day (QID) | ORAL | Status: DC | PRN

## 2015-08-08 MED ORDER — ACETAMINOPHEN 325 MG PO TABS
650.0000 mg | ORAL_TABLET | Freq: Once | ORAL | Status: AC
  Administered 2015-08-08: 650 mg via ORAL
  Filled 2015-08-08: qty 2

## 2015-08-08 MED ORDER — IBUPROFEN 800 MG PO TABS
800.0000 mg | ORAL_TABLET | Freq: Once | ORAL | Status: AC
  Administered 2015-08-08: 800 mg via ORAL
  Filled 2015-08-08: qty 1

## 2015-08-08 MED ORDER — ONDANSETRON HCL 4 MG PO TABS
4.0000 mg | ORAL_TABLET | Freq: Once | ORAL | Status: AC
  Administered 2015-08-08: 4 mg via ORAL
  Filled 2015-08-08: qty 1

## 2015-08-08 MED ORDER — DIPHENHYDRAMINE HCL 25 MG PO CAPS
50.0000 mg | ORAL_CAPSULE | Freq: Once | ORAL | Status: AC
  Administered 2015-08-08: 50 mg via ORAL
  Filled 2015-08-08: qty 2

## 2015-08-08 MED ORDER — CEFTRIAXONE SODIUM 250 MG IJ SOLR
250.0000 mg | Freq: Once | INTRAMUSCULAR | Status: AC
  Administered 2015-08-08: 250 mg via INTRAMUSCULAR
  Filled 2015-08-08: qty 250

## 2015-08-08 MED ORDER — METRONIDAZOLE 500 MG PO TABS: 500.0000 mg | ORAL_TABLET | Freq: Two times a day (BID) | ORAL | Status: DC

## 2015-08-08 NOTE — ED Provider Notes (Signed)
CSN: [id number]     Arrival date & time 08/08/15  1053 History   First MD Initiated Contact with Patient 08/08/15 1110     Chief Complaint  Patient presents with  . Angioedema  . Back Pain  . Abdominal Pain     (Consider location/radiation/quality/duration/timing/severity/associated sxs/prior Treatment) HPI  Alicia Blake is a 49 y.o. female with PMH significant for chronic back pain, HTN, asthma, GERD, PTSD who presents with back pain, lower abdominal pain, and left lip swelling.  Patient states that about 2 weeks ago her back pain began to get worse.  No injury or trauma.  She states that she ran out of hydrocodone/oxycodene because she has recently moved from [place].  She states it feels like a knot in her back.  She has tried Aleve, tylenol, and ibuprofen; however, found no relief.  The hydrocodene helps and she states she did receive a shot that helped as well.    She states that her abdominal pain began about 2 weeks ago as well and describes it as constant and worse when she urinates.    The lip swelling began last night.  She states that she ate a cheeseburger and shortly afterwards the left side of her lip and face began to swell.  She states it has gotten worse since last night and it's tingling and tight feeling.  No difficulty breathing or throat swelling.  Endorses urinary urgency, dysuria, malodorous urine, vaginal discharge, constipation, and possible bloody stools.  Denies CP, SOB, or hematuria.  LMP over a year ago.  She states she is sexually active, but hasn't been in a while.   Past Medical History  Diagnosis Date  . Hypertension   . Asthma   . Bronchitis   . GERD (gastroesophageal reflux disease)   . Thyroid disease   . PTSD (post-traumatic stress disorder)   . Bipolar affect, depressed ([place])   . Chronic back pain    Past Surgical History  Procedure Laterality Date  . Neck surgery    . Abdominal surgery     Family History  Problem Relation Age of Onset    . Hypertension Mother   . Hypertension Maternal Grandmother   . Hypertension Paternal Grandmother   . Diabetes Paternal Grandmother   . Hypertension Paternal Grandfather   . Diabetes Paternal Grandfather    Social History  Substance Use Topics  . Smoking status: Current Every Day Smoker -- 0.25 packs/day    Types: Cigarettes  . Smokeless tobacco: None  . Alcohol Use: Yes     Comment: quit date 01/17/2013   OB History    Gravida Para Term Preterm AB TAB SAB Ectopic Multiple Living   1 1 1       1      Review of Systems  All other systems negative unless otherwise stated in HPI   Allergies  Crab and Lithium  Home Medications   Prior to Admission medications   Medication Sig Start Date End Date Taking? Authorizing Provider  acetaminophen (TYLENOL) 500 MG tablet Take 1 tablet (500 mg total) by mouth every 6 (six) hours as needed. 08/08/15   Gloriann Loan, PA-C  albuterol (PROVENTIL HFA;VENTOLIN HFA) 108 (90 BASE) MCG/ACT inhaler Inhale 2 puffs into the lungs every 6 (six) hours as needed for wheezing.    Historical Provider, MD  albuterol (PROVENTIL) (2.5 MG/3ML) 0.083% nebulizer solution Take 2.5 mg by nebulization every 6 (six) hours as needed for wheezing.    Historical Provider, MD  amitriptyline (  ELAVIL) 10 MG tablet Take 10 mg by mouth at bedtime.    Historical Provider, MD  diphenhydrAMINE (BENADRYL) 25 mg capsule Take 1 capsule (25 mg total) by mouth every 6 (six) hours as needed. 08/08/15   Gloriann Loan, PA-C  doxycycline (VIBRAMYCIN) 100 MG capsule Take 1 capsule (100 mg total) by mouth 2 (two) times daily. 08/08/15   Gloriann Loan, PA-C  Fluticasone-Salmeterol (ADVAIR) 250-50 MCG/DOSE AEPB Inhale 1 puff into the lungs daily as needed. For shortness of breath    Historical Provider, MD  furosemide (LASIX) 20 MG tablet Take 20 mg by mouth.    Historical Provider, MD  hydrOXYzine (VISTARIL) 25 MG capsule Take 25 mg by mouth daily.    Historical Provider, MD  ibuprofen  (ADVIL,MOTRIN) 800 MG tablet Take 1 tablet (800 mg total) by mouth 3 (three) times daily. 08/08/15   Maimuna Leaman, PA-C  lisinopril (PRINIVIL,ZESTRIL) 10 MG tablet Take 10 mg by mouth daily.    Historical Provider, MD  loratadine (CLARITIN) 10 MG tablet Take 10 mg by mouth daily.    Historical Provider, MD  meloxicam (MOBIC) 15 MG tablet Take 1 tablet (15 mg total) by mouth daily. Patient not taking: Reported on 02/07/2015 02/25/14   Linton Flemings, MD  metroNIDAZOLE (FLAGYL) 500 MG tablet Take 1 tablet (500 mg total) by mouth 2 (two) times daily. 08/08/15   Bing Duffey, PA-C  OLANZapine (ZYPREXA) 15 MG tablet Take 15 mg by mouth at bedtime.     Historical Provider, MD  omeprazole (PRILOSEC) 20 MG capsule Take 20 mg by mouth daily.    Historical Provider, MD  orphenadrine (NORFLEX) 100 MG tablet Take 1 tablet (100 mg total) by mouth 2 (two) times daily. Patient not taking: Reported on 01/30/2015 06/10/14   Sherian Maroon, MD  PARoxetine (PAXIL) 20 MG tablet Take 20 mg by mouth daily.    Historical Provider, MD  tiotropium (SPIRIVA) 18 MCG inhalation capsule Place 18 mcg into inhaler and inhale daily as needed. For shortness of breath    Historical Provider, MD  tolterodine (DETROL) 1 MG tablet Take 1 mg by mouth 2 (two) times daily.    Historical Provider, MD   BP 111/74 mmHg  Pulse 88  Temp(Src) 98.4 F (36.9 C) (Oral)  Resp 18  SpO2 100%  LMP 07/14/2013 Physical Exam  Constitutional: She is oriented to person, place, and time. She appears well-developed and well-nourished.  HENT:  Head: Normocephalic and atraumatic.  Mouth/Throat: Oropharynx is clear and moist.  Airway patent.  No difficulty breathing. No drooling. Left lip and cheek swelling.  Mildly TTP.  Eyes: Conjunctivae and EOM are normal. Pupils are equal, round, and reactive to light.  Neck: Normal range of motion. Neck supple.  Cardiovascular: Normal rate, regular rhythm, normal heart sounds and intact distal pulses.   No murmur  heard. Pulmonary/Chest: Effort normal and breath sounds normal. No stridor. No respiratory distress. She has no wheezes. She has no rales.  Abdominal: Soft. Bowel sounds are normal. She exhibits no distension. There is tenderness in the right lower quadrant, suprapubic area and left lower quadrant. There is no rigidity, no rebound and no guarding.  Genitourinary: Rectum normal and vagina normal. Guaiac negative stool. Pelvic exam was performed with patient prone. There is no tenderness or lesion on the right labia. There is no tenderness or lesion on the left labia. Cervix exhibits motion tenderness and discharge. Right adnexum displays tenderness. Left adnexum displays tenderness.  Normal rectal tone.   Musculoskeletal: Normal  range of motion.       Cervical back: Normal.       Thoracic back: Normal.       Lumbar back: Normal.       Arms: Lymphadenopathy:    She has no cervical adenopathy.  Neurological: She is alert and oriented to person, place, and time. She has normal strength. No cranial nerve deficit or sensory deficit.  No saddle anesthesia.   Skin: Skin is warm and dry.  Psychiatric: She has a normal mood and affect. Her behavior is normal.    ED Course  Procedures (including critical care time) Labs Review Labs Reviewed  WET PREP, GENITAL - Abnormal; Notable for the following:    Clue Cells Wet Prep HPF POC FEW (*)    WBC, Wet Prep HPF POC FEW (*)    All other components within normal limits  CBC WITH DIFFERENTIAL/PLATELET - Abnormal; Notable for the following:    RDW 15.6 (*)    All other components within normal limits  COMPREHENSIVE METABOLIC PANEL - Abnormal; Notable for the following:    Sodium 133 (*)    Chloride 99 (*)    Glucose, Bld 122 (*)    Creatinine, Ser 1.16 (*)    Albumin 3.3 (*)    GFR calc non Af Amer 54 (*)    All other components within normal limits  URINALYSIS, ROUTINE W REFLEX MICROSCOPIC (NOT AT [facility])  HIV ANTIBODY (ROUTINE TESTING)  POC URINE  PREG, ED  POC OCCULT BLOOD, ED  GC/CHLAMYDIA PROBE AMP ([place]) NOT AT [facility]    Imaging Review No results found. I have personally reviewed and evaluated these images and lab results as part of my medical decision-making.   EKG Interpretation None      MDM   Final diagnoses:  Vaginal discharge  Chronic back pain  Lip swelling    Patient presents with chronic low back pain, lower abdominal pain, and lip swelling. Endorses increased urinary frequency, dysuria, malodorous urine, left sided numbness and tingling. VSS.  On exam, airway patent, no difficulty speaking, no drooling, no stridor. Mild swelling of left lip and cheek. Doubt angioedema or anaphylaxis. TTP on left back musculature.  Abdomen soft, TTP in LLQ/RLQ/suprapubic areas.  She has CMT and adnexal tenderness.  Guaiac negative. No neurological deficits.  Strength/sensation intact b/l in all extremities. Tylenol for pain control and benadryl for facial swelling.  UA, wet prep, STD, CMP, and CBC ordered. Suspect PID.  Doubt cauda equina, doubt discitis, doubt AAA.  Suspect chronic low back pain.   -urine preg negative, CBC unremarkable, UA shows no signs of infection, CMP shows Cr 1.16. -Bladder scan ordered given increased urinary frequency and overflow incontinence.  -Upon reassessment, lip swelling improving and symptoms improving.   Will treat empirically with Rocephin and Azithromycin.  Will d/c home with doxycycline and flagyl for PID.  Benadryl for swelling. Patient is ambulatory. Discussed her back pain is a chronic issue and is best treated by primary care.  Patient agrees and acknowledges the above plan. Patient stable for discharge.   Case has been discussed with Dr. Colin Rhein who agrees with the above plan for discharge.   Gloriann Loan, PA-C 08/08/15 1416  Debby Freiberg, MD 08/12/15 [phone]

## 2015-08-08 NOTE — Progress Notes (Signed)
Patient is a current orange card holder and established with care at the [facility]. Patient will be linked with a [facility] case manager after discharge to address expressed concerns. Patient informed to follow up with her pcp. My contact information provided for any future questions or concerns. No other [facility]ist needs identified at this time.  [facility] [phone]

## 2015-08-08 NOTE — ED Notes (Signed)
Pt sts lower back and abd pain; pt sts woke up this am with swollen lips; no distress noted

## 2015-08-08 NOTE — Discharge Instructions (Signed)
Back Pain, Adult °Back pain is very common in adults. The cause of back pain is rarely dangerous and the pain often gets better over time. The cause of your back pain may not be known. Some common causes of back pain include: °· Strain of the muscles or ligaments supporting the spine. °· Wear and tear (degeneration) of the spinal disks. °· Arthritis. °· Direct injury to the back. °For many people, back pain may return. Since back pain is rarely dangerous, most people can learn to manage this condition on their own. °HOME CARE INSTRUCTIONS °Watch your back pain for any changes. The following actions may help to lessen any discomfort you are feeling: °· Remain active. It is stressful on your back to sit or stand in one place for long periods of time. Do not sit, drive, or stand in one place for more than 30 minutes at a time. Take short walks on even surfaces as soon as you are able. Try to increase the length of time you walk each day. °· Exercise regularly as directed by your health care provider. Exercise helps your back heal faster. It also helps avoid future injury by keeping your muscles strong and flexible. °· Do not stay in bed. Resting more than 1-2 days can delay your recovery. °· Pay attention to your body when you bend and lift. The most comfortable positions are those that put less stress on your recovering back. Always use proper lifting techniques, including: °¨ Bending your knees. °¨ Keeping the load close to your body. °¨ Avoiding twisting. °· Find a comfortable position to sleep. Use a firm mattress and lie on your side with your knees slightly bent. If you lie on your back, put a pillow under your knees. °· Avoid feeling anxious or stressed. Stress increases muscle tension and can worsen back pain. It is important to recognize when you are anxious or stressed and learn ways to manage it, such as with exercise. °· Take medicines only as directed by your health care provider. Over-the-counter  medicines to reduce pain and inflammation are often the most helpful. Your health care provider may prescribe muscle relaxant drugs. These medicines help dull your pain so you can more quickly return to your normal activities and healthy exercise. °· Apply ice to the injured area: °¨ Put ice in a plastic bag. °¨ Place a towel between your skin and the bag. °¨ Leave the ice on for 20 minutes, 2-3 times a day for the first 2-3 days. After that, ice and heat may be alternated to reduce pain and spasms. °· Maintain a healthy weight. Excess weight puts extra stress on your back and makes it difficult to maintain good posture. °SEEK MEDICAL CARE IF: °· You have pain that is not relieved with rest or medicine. °· You have increasing pain going down into the legs or buttocks. °· You have pain that does not improve in one week. °· You have night pain. °· You lose weight. °· You have a fever or chills. °SEEK IMMEDIATE MEDICAL CARE IF:  °· You develop new bowel or bladder control problems. °· You have unusual weakness or numbness in your arms or legs. °· You develop nausea or vomiting. °· You develop abdominal pain. °· You feel faint. °  °This information is not intended to replace advice given to you by your health care provider. Make sure you discuss any questions you have with your health care provider. °  °Document Released: 10/14/2005 Document Revised: 11/04/2014 Document Reviewed: 02/15/2014 °Elsevier Interactive Patient Education ©2016 Elsevier   Inc.  Pelvic Inflammatory Disease   Pelvic inflammatory disease (PID) refers to an infection in some or all of the female organs. The infection can be in the uterus, ovaries, fallopian tubes, or the surrounding tissues in the pelvis. PID can cause abdominal or pelvic pain that comes on suddenly (acute pelvic pain). PID is a serious infection because it can lead to lasting (chronic) pelvic pain or the inability to have children (infertility).  CAUSES  This condition is most  often caused by an infection that is spread during sexual contact. However, the infection can also be caused by the normal bacteria that are found in the vaginal tissues if these bacteria travel upward into the reproductive organs. PID can also occur following:  The birth of a baby.  A miscarriage.  An abortion.  Major pelvic surgery.  The use of an intrauterine device (IUD).  A sexual assault. RISK FACTORS  This condition is more likely to develop in women who:  Are younger than 50 years of age.  Are sexually active at a young age.  Use nonbarrier contraception.  Have multiple sexual partners.  Have sex with someone who has symptoms of an STD (sexually transmitted disease).  Use oral contraception. At times, certain behaviors can also increase the possibility of getting PID, such as:  Using a vaginal douche.  Having an IUD in place. SYMPTOMS  Symptoms of this condition include:  Abdominal or pelvic pain.  Fever.  Chills.  Abnormal vaginal discharge.  Abnormal uterine bleeding.  Unusual pain shortly after the end of a menstrual period.  Painful urination.  Pain with sexual intercourse.  Nausea and vomiting. DIAGNOSIS  To diagnose this condition, your health care provider will do a physical exam and take your medical history. A pelvic exam typically reveals great tenderness in the uterus and the surrounding pelvic tissues. You may also have tests, such as:  Lab tests, including a pregnancy test, blood tests, and urine test.  Culture tests of the vagina and cervix to check for an STD.  Ultrasound.  A laparoscopic procedure to look inside the pelvis.  Examining vaginal secretions under a microscope. TREATMENT  Treatment for this condition may involve one or more approaches.  Antibiotic medicines may be prescribed to be taken by mouth.  Sexual partners may need to be treated if the infection is caused by an STD.  For more severe cases, hospitalization may be needed to give  antibiotics directly into a vein through an IV tube.  Surgery may be needed if other treatments do not help, but this is rare. It may take weeks until you are completely well. If you are diagnosed with PID, you should also be checked for human immunodeficiency virus (HIV). Your health care provider may test you for infection again 3 months after treatment. You should not have unprotected sex.  HOME CARE INSTRUCTIONS  Take over-the-counter and prescription medicines only as told by your health care provider.  If you were prescribed an antibiotic medicine, take it as told by your health care provider. Do not stop taking the antibiotic even if you start to feel better.  Do not have sexual intercourse until treatment is completed or as told by your health care provider. If PID is confirmed, your recent sexual partners will need treatment, especially if you had unprotected sex.  Keep all follow-up visits as told by your health care provider. This is important. SEEK MEDICAL CARE IF:  You have increased or abnormal vaginal discharge.  Your pain  does not improve.  You vomit.  You have a fever.  You cannot tolerate your medicines.  Your partner has an STD.  You have pain when you urinate. SEEK IMMEDIATE MEDICAL CARE IF:  You have increased abdominal or pelvic pain.  You have chills.  Your symptoms are not better in 72 hours even with treatment. This information is not intended to replace advice given to you by your health care provider. Make sure you discuss any questions you have with your health care provider.  Document Released: 10/14/2005 Document Revised: 07/05/2015 Document Reviewed: 11/21/2014  Elsevier Interactive Patient Education [employer].

## 2015-08-08 NOTE — ED Notes (Signed)
PA at bedside.

## 2015-08-09 LAB — GC/CHLAMYDIA PROBE AMP (~~LOC~~) NOT AT ARMC
Chlamydia: NEGATIVE
Neisseria Gonorrhea: NEGATIVE

## 2015-08-09 LAB — HIV ANTIBODY (ROUTINE TESTING W REFLEX): HIV Screen 4th Generation wRfx: NONREACTIVE

## 2015-08-30 ENCOUNTER — Encounter: Payer: Self-pay | Admitting: *Deleted

## 2015-09-06 ENCOUNTER — Other Ambulatory Visit: Payer: Self-pay

## 2015-09-06 ENCOUNTER — Encounter (HOSPITAL_COMMUNITY): Payer: Self-pay | Admitting: *Deleted

## 2015-09-06 ENCOUNTER — Inpatient Hospital Stay (HOSPITAL_COMMUNITY)
Admission: AD | Admit: 2015-09-06 | Discharge: 2015-09-12 | DRG: 885 | Disposition: A | Discharge status: Home / Self Care | Payer: Medicaid Other | Source: Intra-hospital | Attending: Psychiatry | Admitting: Psychiatry

## 2015-09-06 ENCOUNTER — Emergency Department (HOSPITAL_COMMUNITY)
Admission: EM | Admit: 2015-09-06 | Discharge: 2015-09-06 | Disposition: A | Discharge status: Other Acute Inpatient Hospital | Payer: Medicaid Other | Attending: Emergency Medicine | Admitting: Emergency Medicine

## 2015-09-06 DIAGNOSIS — K219 Gastro-esophageal reflux disease without esophagitis: Secondary | ICD-10-CM | POA: Insufficient documentation

## 2015-09-06 DIAGNOSIS — F4310 Post-traumatic stress disorder, unspecified: Secondary | ICD-10-CM | POA: Diagnosis present

## 2015-09-06 DIAGNOSIS — G894 Chronic pain syndrome: Secondary | ICD-10-CM | POA: Diagnosis present

## 2015-09-06 DIAGNOSIS — F1021 Alcohol dependence, in remission: Secondary | ICD-10-CM

## 2015-09-06 DIAGNOSIS — F1110 Opioid abuse, uncomplicated: Secondary | ICD-10-CM | POA: Clinically undetermined

## 2015-09-06 DIAGNOSIS — Z7951 Long term (current) use of inhaled steroids: Secondary | ICD-10-CM | POA: Insufficient documentation

## 2015-09-06 DIAGNOSIS — Z720 Tobacco use: Secondary | ICD-10-CM | POA: Insufficient documentation

## 2015-09-06 DIAGNOSIS — J40 Bronchitis, not specified as acute or chronic: Secondary | ICD-10-CM

## 2015-09-06 DIAGNOSIS — F17210 Nicotine dependence, cigarettes, uncomplicated: Secondary | ICD-10-CM | POA: Diagnosis not present

## 2015-09-06 DIAGNOSIS — R45851 Suicidal ideations: Secondary | ICD-10-CM

## 2015-09-06 DIAGNOSIS — F32A Depression, unspecified: Secondary | ICD-10-CM | POA: Diagnosis present

## 2015-09-06 DIAGNOSIS — Z8249 Family history of ischemic heart disease and other diseases of the circulatory system: Secondary | ICD-10-CM

## 2015-09-06 DIAGNOSIS — I10 Essential (primary) hypertension: Secondary | ICD-10-CM | POA: Diagnosis not present

## 2015-09-06 DIAGNOSIS — F250 Schizoaffective disorder, bipolar type: Secondary | ICD-10-CM | POA: Diagnosis present

## 2015-09-06 DIAGNOSIS — Z8639 Personal history of other endocrine, nutritional and metabolic disease: Secondary | ICD-10-CM | POA: Insufficient documentation

## 2015-09-06 DIAGNOSIS — J45901 Unspecified asthma with (acute) exacerbation: Secondary | ICD-10-CM | POA: Insufficient documentation

## 2015-09-06 DIAGNOSIS — Z79899 Other long term (current) drug therapy: Secondary | ICD-10-CM | POA: Insufficient documentation

## 2015-09-06 DIAGNOSIS — F1221 Cannabis dependence, in remission: Secondary | ICD-10-CM | POA: Diagnosis present

## 2015-09-06 DIAGNOSIS — F1290 Cannabis use, unspecified, uncomplicated: Secondary | ICD-10-CM | POA: Diagnosis not present

## 2015-09-06 DIAGNOSIS — F1410 Cocaine abuse, uncomplicated: Secondary | ICD-10-CM | POA: Diagnosis present

## 2015-09-06 DIAGNOSIS — R443 Hallucinations, unspecified: Secondary | ICD-10-CM

## 2015-09-06 DIAGNOSIS — F3130 Bipolar disorder, current episode depressed, mild or moderate severity, unspecified: Secondary | ICD-10-CM | POA: Insufficient documentation

## 2015-09-06 DIAGNOSIS — Z8781 Personal history of (healed) traumatic fracture: Secondary | ICD-10-CM

## 2015-09-06 DIAGNOSIS — F1490 Cocaine use, unspecified, uncomplicated: Secondary | ICD-10-CM | POA: Diagnosis not present

## 2015-09-06 DIAGNOSIS — M51369 Other intervertebral disc degeneration, lumbar region without mention of lumbar back pain or lower extremity pain: Secondary | ICD-10-CM | POA: Diagnosis present

## 2015-09-06 DIAGNOSIS — Z91410 Personal history of adult physical and sexual abuse: Secondary | ICD-10-CM

## 2015-09-06 DIAGNOSIS — Z833 Family history of diabetes mellitus: Secondary | ICD-10-CM | POA: Diagnosis not present

## 2015-09-06 DIAGNOSIS — G8929 Other chronic pain: Secondary | ICD-10-CM | POA: Insufficient documentation

## 2015-09-06 DIAGNOSIS — G4700 Insomnia, unspecified: Secondary | ICD-10-CM | POA: Diagnosis present

## 2015-09-06 DIAGNOSIS — E876 Hypokalemia: Secondary | ICD-10-CM | POA: Clinically undetermined

## 2015-09-06 DIAGNOSIS — Z9119 Patient's noncompliance with other medical treatment and regimen: Secondary | ICD-10-CM | POA: Diagnosis not present

## 2015-09-06 DIAGNOSIS — M5136 Other intervertebral disc degeneration, lumbar region: Secondary | ICD-10-CM | POA: Diagnosis present

## 2015-09-06 DIAGNOSIS — Z1231 Encounter for screening mammogram for malignant neoplasm of breast: Secondary | ICD-10-CM

## 2015-09-06 DIAGNOSIS — F1421 Cocaine dependence, in remission: Secondary | ICD-10-CM | POA: Diagnosis present

## 2015-09-06 DIAGNOSIS — M7989 Other specified soft tissue disorders: Secondary | ICD-10-CM | POA: Insufficient documentation

## 2015-09-06 DIAGNOSIS — F329 Major depressive disorder, single episode, unspecified: Secondary | ICD-10-CM | POA: Diagnosis present

## 2015-09-06 DIAGNOSIS — M62830 Muscle spasm of back: Secondary | ICD-10-CM | POA: Insufficient documentation

## 2015-09-06 DIAGNOSIS — F1121 Opioid dependence, in remission: Secondary | ICD-10-CM | POA: Diagnosis present

## 2015-09-06 LAB — CBC
HEMATOCRIT: 32.3 % — AB (ref 36.0–46.0)
HEMOGLOBIN: 10.8 g/dL — AB (ref 12.0–15.0)
MCH: 29.8 pg (ref 26.0–34.0)
MCHC: 33.4 g/dL (ref 30.0–36.0)
MCV: 89.2 fL (ref 78.0–100.0)
Platelets: 219 10*3/uL (ref 150–400)
RBC: 3.62 MIL/uL — AB (ref 3.87–5.11)
RDW: 15.9 % — ABNORMAL HIGH (ref 11.5–15.5)
WBC: 8.7 10*3/uL (ref 4.0–10.5)

## 2015-09-06 LAB — RAPID URINE DRUG SCREEN, HOSP PERFORMED
AMPHETAMINES: NOT DETECTED
BARBITURATES: NOT DETECTED
Benzodiazepines: NOT DETECTED
Cocaine: NOT DETECTED
OPIATES: NOT DETECTED
Tetrahydrocannabinol: NOT DETECTED

## 2015-09-06 LAB — COMPREHENSIVE METABOLIC PANEL
ALK PHOS: 71 U/L (ref 38–126)
ALT: 13 U/L — AB (ref 14–54)
AST: 15 U/L (ref 15–41)
Albumin: 3.3 g/dL — ABNORMAL LOW (ref 3.5–5.0)
Anion gap: 8 (ref 5–15)
BUN: 9 mg/dL (ref 6–20)
CALCIUM: 8.8 mg/dL — AB (ref 8.9–10.3)
CHLORIDE: 102 mmol/L (ref 101–111)
CO2: 29 mmol/L (ref 22–32)
CREATININE: 0.86 mg/dL (ref 0.44–1.00)
GFR calc Af Amer: >60 mL/min (ref >60)
GFR calc non Af Amer: >60 mL/min (ref >60)
GLUCOSE: 130 mg/dL — AB (ref 65–99)
Potassium: 3 mmol/L — ABNORMAL LOW (ref 3.5–5.1)
SODIUM: 139 mmol/L (ref 135–145)
Total Bilirubin: 0.8 mg/dL (ref 0.3–1.2)
Total Protein: 7.9 g/dL (ref 6.5–8.1)

## 2015-09-06 LAB — ACETAMINOPHEN LEVEL: Acetaminophen (Tylenol), Serum: <10 ug/mL — ABNORMAL LOW (ref 10–30)

## 2015-09-06 LAB — SALICYLATE LEVEL: Salicylate Lvl: <4 mg/dL (ref 2.8–30.0)

## 2015-09-06 LAB — ETHANOL: Alcohol, Ethyl (B): <5 mg/dL (ref <5)

## 2015-09-06 MED ORDER — TIOTROPIUM BROMIDE MONOHYDRATE 18 MCG IN CAPS
18.0000 ug | ORAL_CAPSULE | Freq: Every day | RESPIRATORY_TRACT | Status: DC
  Filled 2015-09-06: qty 5

## 2015-09-06 MED ORDER — MOMETASONE FURO-FORMOTEROL FUM 100-5 MCG/ACT IN AERO
2.0000 | INHALATION_SPRAY | Freq: Two times a day (BID) | RESPIRATORY_TRACT | Status: DC
  Administered 2015-09-06: 2 via RESPIRATORY_TRACT
  Filled 2015-09-06: qty 8.8

## 2015-09-06 MED ORDER — HYDROXYZINE HCL 25 MG PO TABS
25.0000 mg | ORAL_TABLET | Freq: Four times a day (QID) | ORAL | Status: DC | PRN
  Administered 2015-09-07 – 2015-09-09 (×3): 25 mg via ORAL
  Filled 2015-09-06: qty 10
  Filled 2015-09-06 (×3): qty 1

## 2015-09-06 MED ORDER — QUETIAPINE FUMARATE 100 MG PO TABS: 100.0000 mg | ORAL_TABLET | Freq: Every day | ORAL | Status: DC

## 2015-09-06 MED ORDER — ONDANSETRON HCL 4 MG PO TABS: 4.0000 mg | ORAL_TABLET | Freq: Three times a day (TID) | ORAL | Status: DC | PRN

## 2015-09-06 MED ORDER — LISINOPRIL 10 MG PO TABS: 10.0000 mg | ORAL_TABLET | Freq: Every day | ORAL | Status: DC

## 2015-09-06 MED ORDER — POTASSIUM CHLORIDE CRYS ER 20 MEQ PO TBCR
60.0000 meq | EXTENDED_RELEASE_TABLET | Freq: Once | ORAL | Status: AC
  Administered 2015-09-06: 60 meq via ORAL
  Filled 2015-09-06: qty 3

## 2015-09-06 MED ORDER — TRAZODONE HCL 50 MG PO TABS
50.0000 mg | ORAL_TABLET | Freq: Every evening | ORAL | Status: DC | PRN
  Administered 2015-09-07: 50 mg via ORAL
  Filled 2015-09-06: qty 1

## 2015-09-06 MED ORDER — HYDROCODONE-ACETAMINOPHEN 5-325 MG PO TABS: 1.0000 | ORAL_TABLET | Freq: Four times a day (QID) | ORAL | Status: DC | PRN

## 2015-09-06 MED ORDER — DOXYCYCLINE HYCLATE 100 MG PO TABS
100.0000 mg | ORAL_TABLET | Freq: Two times a day (BID) | ORAL | Status: DC
  Administered 2015-09-06: 100 mg via ORAL
  Filled 2015-09-06: qty 1

## 2015-09-06 MED ORDER — DOXYCYCLINE HYCLATE 100 MG PO CAPS: 100.0000 mg | ORAL_CAPSULE | Freq: Two times a day (BID) | ORAL | Status: DC

## 2015-09-06 MED ORDER — ALUM & MAG HYDROXIDE-SIMETH 200-200-20 MG/5ML PO SUSP
30.0000 mL | ORAL | Status: DC | PRN
  Administered 2015-09-08 – 2015-09-12 (×6): 30 mL via ORAL
  Filled 2015-09-06 (×6): qty 30

## 2015-09-06 MED ORDER — PAROXETINE HCL 20 MG PO TABS: 20.0000 mg | ORAL_TABLET | Freq: Every day | ORAL | Status: DC

## 2015-09-06 MED ORDER — MELOXICAM 15 MG PO TABS: 15.0000 mg | ORAL_TABLET | Freq: Every day | ORAL | Status: DC

## 2015-09-06 MED ORDER — NICOTINE 21 MG/24HR TD PT24
21.0000 mg | MEDICATED_PATCH | Freq: Every day | TRANSDERMAL | Status: DC
  Administered 2015-09-06: 21 mg via TRANSDERMAL
  Filled 2015-09-06: qty 1

## 2015-09-06 MED ORDER — ALBUTEROL SULFATE (2.5 MG/3ML) 0.083% IN NEBU
2.5000 mg | INHALATION_SOLUTION | Freq: Four times a day (QID) | RESPIRATORY_TRACT | Status: DC | PRN
  Filled 2015-09-06: qty 3

## 2015-09-06 MED ORDER — ORPHENADRINE CITRATE ER 100 MG PO TB12: 100.0000 mg | ORAL_TABLET | Freq: Two times a day (BID) | ORAL | Status: DC

## 2015-09-06 MED ORDER — ACETAMINOPHEN 325 MG PO TABS
650.0000 mg | ORAL_TABLET | Freq: Four times a day (QID) | ORAL | Status: DC | PRN
  Administered 2015-09-07 (×2): 650 mg via ORAL
  Filled 2015-09-06 (×2): qty 2

## 2015-09-06 MED ORDER — LORAZEPAM 1 MG PO TABS
1.0000 mg | ORAL_TABLET | Freq: Three times a day (TID) | ORAL | Status: DC | PRN
  Administered 2015-09-06: 1 mg via ORAL
  Filled 2015-09-06: qty 1

## 2015-09-06 MED ORDER — ZOLPIDEM TARTRATE 5 MG PO TABS: 5.0000 mg | ORAL_TABLET | Freq: Every evening | ORAL | Status: DC | PRN

## 2015-09-06 MED ORDER — MAGNESIUM HYDROXIDE 400 MG/5ML PO SUSP
30.0000 mL | Freq: Every day | ORAL | Status: DC | PRN
  Administered 2015-09-12: 30 mL via ORAL
  Filled 2015-09-06: qty 30

## 2015-09-06 MED ORDER — OXYBUTYNIN CHLORIDE ER 5 MG PO TB24: 5.0000 mg | ORAL_TABLET | Freq: Every day | ORAL | Status: DC

## 2015-09-06 MED ORDER — HYDROXYZINE PAMOATE 25 MG PO CAPS: 25.0000 mg | ORAL_CAPSULE | Freq: Every day | ORAL | Status: DC

## 2015-09-06 MED ORDER — ACETAMINOPHEN 325 MG PO TABS: 650.0000 mg | ORAL_TABLET | ORAL | Status: DC | PRN

## 2015-09-06 MED ORDER — HYDROXYZINE PAMOATE 50 MG PO CAPS
50.0000 mg | ORAL_CAPSULE | Freq: Three times a day (TID) | ORAL | Status: DC | PRN
  Filled 2015-09-06: qty 1

## 2015-09-06 MED ORDER — FUROSEMIDE 20 MG PO TABS
20.0000 mg | ORAL_TABLET | Freq: Every day | ORAL | Status: DC
  Administered 2015-09-06: 20 mg via ORAL
  Filled 2015-09-06: qty 1

## 2015-09-06 MED ORDER — LORATADINE 10 MG PO TABS
10.0000 mg | ORAL_TABLET | Freq: Every day | ORAL | Status: DC
  Administered 2015-09-06: 10 mg via ORAL
  Filled 2015-09-06: qty 1

## 2015-09-06 MED ORDER — HYDROXYZINE HCL 25 MG PO TABS: 50.0000 mg | ORAL_TABLET | Freq: Three times a day (TID) | ORAL | Status: DC | PRN

## 2015-09-06 MED ORDER — PANTOPRAZOLE SODIUM 40 MG PO TBEC
40.0000 mg | DELAYED_RELEASE_TABLET | Freq: Every day | ORAL | Status: DC
  Administered 2015-09-06: 40 mg via ORAL
  Filled 2015-09-06: qty 1

## 2015-09-06 MED ORDER — ALUM & MAG HYDROXIDE-SIMETH 200-200-20 MG/5ML PO SUSP: 30.0000 mL | ORAL | Status: DC | PRN

## 2015-09-06 MED ORDER — ALBUTEROL SULFATE HFA 108 (90 BASE) MCG/ACT IN AERS: 2.0000 | INHALATION_SPRAY | Freq: Four times a day (QID) | RESPIRATORY_TRACT | Status: DC | PRN

## 2015-09-06 MED ORDER — IBUPROFEN 200 MG PO TABS
600.0000 mg | ORAL_TABLET | Freq: Three times a day (TID) | ORAL | Status: DC | PRN
  Administered 2015-09-06: 600 mg via ORAL
  Filled 2015-09-06: qty 3

## 2015-09-06 NOTE — ED Notes (Addendum)
Pt reports SI x 1 month, pt hx of SI attempt by OD in 2013.   SI plan currently is to OD or cut herself. AH x1 month, VH- seeing shadows and people x6 months.   Pt also complains about bilateral ankle swelling and lower back pain. Hx of back issues.  Pain 10/10.  Pt able to ambulate independently.

## 2015-09-06 NOTE — Progress Notes (Signed)
Pt is a 49 y/o female admitted to [facility] with h/o Schizophrenia, bipolar d/o. Pt presented to [facility] with flat affect and depressed / anxious mood.  Per report pt has been experiencing AH X 1 month and VH of shadows which she denied on initial contact. Pt did endorsed SI with plan to OD or cut wrist. Verbally contracted for safety. All medications administered as per order including PRN Motrin for pain and PRN Ativan for anxiety. Q 15 minutes checks maintained for safety.

## 2015-09-06 NOTE — [facility] Counselor (Signed)
[facility] Assessment Progress Note  Per Catalina Pizza, DNP, pt meets IP criteria. She has been accepted to [facility] room 500-02. She can come after 2100. Call report to [phone]. Pt has signed support paperwork and it has been faxed to [facility]. Pt's nurse, Linwood Dibbles, has been notified.   Alicia Blake. Lovena Le, [place], [place], [facility] Counselor

## 2015-09-06 NOTE — Tx Team (Signed)
Initial Interdisciplinary Treatment Plan   PATIENT STRESSORS: Financial difficulties Health problems Medication change or noncompliance   PATIENT STRENGTHS: Average or above average intelligence Communication skills General fund of knowledge Motivation for treatment/growth Religious Affiliation Supportive family/friends   PROBLEM LIST: Problem List/Patient Goals Date to be addressed Date deferred Reason deferred Estimated date of resolution  "having suicidal thoughts, want to kill myself" 09-06-15     "depressed, irritated, angry, frustrated, delusional" 09-06-15     "I'm sick" 09-06-15     "left knee give away" 09-06-15                                    DISCHARGE CRITERIA:  Ability to meet basic life and health needs Improved stabilization in mood, thinking, and/or behavior Motivation to continue treatment in a less acute level of care Need for constant or close observation no longer present Reduction of life-threatening or endangering symptoms to within safe limits Verbal commitment to aftercare and medication compliance  PRELIMINARY DISCHARGE PLAN: Attend aftercare/continuing care group Outpatient therapy Participate in family therapy Return to previous living arrangement  PATIENT/FAMIILY INVOLVEMENT: This treatment plan has been presented to and reviewed with the patient, Alicia Blake, and/or family member.  The patient and family have been given the opportunity to ask questions and make suggestions.  [name] 09/06/2015, 11:51 PM
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
## 2015-09-06 NOTE — ED Provider Notes (Signed)
CSN: [id number]     Arrival date & time 09/06/15  1511 History   First MD Initiated Contact with Patient 09/06/15 1608     Chief Complaint  Patient presents with  . Suicidal  . Hallucinations     (Consider location/radiation/quality/duration/timing/severity/associated sxs/prior Treatment) HPI Comments: Alicia Blake is a 49 y.o. female with a PMHx of HTN, asthma/COPD, GERD, thyroid disease, PTSD, bipolar, schizophrenia, and chronic back pain, who presents to the ED with complaints of suicidal ideation with a plan to overdose or cut herself 1 month. She is also endorsing hearing voices calling her name and seeing shadows in the last 6 months. She is a smoker of one pack per day. No alcohol use or illicit drug use. She is compliant with her psychiatric medications, last took them at 10 AM. 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 She also reports she has had a cough for 3 weeks with green sputum production, her primary care doctor prescribed her an antibiotic (doxycycline) which she has not yet filled. She reports some wheezing. She has albuterol at home that she takes which improves her symptoms. She is also reporting chronic back pain which is unchanged, states that she has not had her injection recently. Chart review reveals that she was recently supposed to reschedule an appt with her orthopedist for a hip injection. she reports she takes Norco at home for this. She denies any other complaints including fevers, chills, chest pain, shortness breath, abdominal pain, nausea, vomiting, diarrhea, constipation, dysuria, hematuria, numbness, tingling, or focal weakness.  Patient is a 50 y.o. female presenting with mental health disorder. The history is provided by the patient and medical records. No language interpreter was used.  Mental Health Problem Presenting symptoms: depression, hallucinations and suicidal  thoughts   Onset quality:  Gradual Duration:  1 month Timing:  Constant Progression:  Worsening Chronicity:  Chronic Treatment compliance:  All of the time Time since last psychoactive medication taken:  6 hours Relieved by:  None tried Worsened by:  Nothing tried Ineffective treatments:  None tried Associated symptoms: no abdominal pain and no chest pain   Risk factors: hx of mental illness and hx of suicide attempts     Past Medical History  Diagnosis Date  . Hypertension   . Asthma   . Bronchitis   . GERD (gastroesophageal reflux disease)   . Thyroid disease   . PTSD (post-traumatic stress disorder)   . Bipolar affect, depressed ([place])   . Chronic back pain    Past Surgical History  Procedure Laterality Date  . Neck surgery    . Abdominal surgery     Family History  Problem Relation Age of Onset  . Hypertension Mother   . Hypertension Maternal Grandmother   . Hypertension Paternal Grandmother   . Diabetes Paternal Grandmother   . Hypertension Paternal Grandfather   . Diabetes Paternal Grandfather    Social History  Substance Use Topics  . Smoking status: Current Every Day Smoker -- 0.25 packs/day    Types: Cigarettes  . Smokeless tobacco: None  . Alcohol Use: Yes     Comment: quit date 01/17/2013   OB History    Gravida Para Term Preterm AB TAB SAB Ectopic Multiple Living   1 1 1       1      Review of Systems  Constitutional: Negative for fever and chills.  Respiratory:  Positive for cough and wheezing. Negative for shortness of breath.   Cardiovascular: Positive for leg swelling (chronic bilateral, unchanged). Negative for chest pain.  Gastrointestinal: Negative for nausea, vomiting, abdominal pain, diarrhea and constipation.  Genitourinary: Negative for dysuria and hematuria.  Musculoskeletal: Positive for back pain (chronic). Negative for myalgias and arthralgias.  Skin: Negative for color change.  Allergic/Immunologic: Negative for immunocompromised  state.  Neurological: Negative for weakness and numbness.  Psychiatric/Behavioral: Positive for suicidal ideas and hallucinations. Negative for confusion.   10 Systems reviewed and are negative for acute change except as noted in the HPI.    Allergies  Crab and Lithium  Home Medications   Prior to Admission medications   Medication Sig Start Date End Date Taking? Authorizing Provider  acetaminophen (TYLENOL) 500 MG tablet Take 1 tablet (500 mg total) by mouth every 6 (six) hours as needed. 08/08/15   Gloriann Loan, PA-C  albuterol (PROVENTIL HFA;VENTOLIN HFA) 108 (90 BASE) MCG/ACT inhaler Inhale 2 puffs into the lungs every 6 (six) hours as needed for wheezing.    Historical Provider, MD  albuterol (PROVENTIL) (2.5 MG/3ML) 0.083% nebulizer solution Take 2.5 mg by nebulization every 6 (six) hours as needed for wheezing.    Historical Provider, MD  amitriptyline (ELAVIL) 10 MG tablet Take 10 mg by mouth at bedtime.    Historical Provider, MD  diphenhydrAMINE (BENADRYL) 25 mg capsule Take 1 capsule (25 mg total) by mouth every 6 (six) hours as needed. 08/08/15   Gloriann Loan, PA-C  doxycycline (VIBRAMYCIN) 100 MG capsule Take 1 capsule (100 mg total) by mouth 2 (two) times daily. 08/08/15   Gloriann Loan, PA-C  Fluticasone-Salmeterol (ADVAIR) 250-50 MCG/DOSE AEPB Inhale 1 puff into the lungs daily as needed. For shortness of breath    Historical Provider, MD  furosemide (LASIX) 20 MG tablet Take 20 mg by mouth.    Historical Provider, MD  hydrOXYzine (VISTARIL) 25 MG capsule Take 25 mg by mouth daily.    Historical Provider, MD  ibuprofen (ADVIL,MOTRIN) 800 MG tablet Take 1 tablet (800 mg total) by mouth 3 (three) times daily. 08/08/15   Kayla Rose, PA-C  lisinopril (PRINIVIL,ZESTRIL) 10 MG tablet Take 10 mg by mouth daily.    Historical Provider, MD  loratadine (CLARITIN) 10 MG tablet Take 10 mg by mouth daily.    Historical Provider, MD  meloxicam (MOBIC) 15 MG tablet Take 1 tablet (15 mg total) by  mouth daily. Patient not taking: Reported on 02/07/2015 02/25/14   Linton Flemings, MD  metroNIDAZOLE (FLAGYL) 500 MG tablet Take 1 tablet (500 mg total) by mouth 2 (two) times daily. 08/08/15   Kayla Rose, PA-C  OLANZapine (ZYPREXA) 15 MG tablet Take 15 mg by mouth at bedtime.     Historical Provider, MD  omeprazole (PRILOSEC) 20 MG capsule Take 20 mg by mouth daily.    Historical Provider, MD  orphenadrine (NORFLEX) 100 MG tablet Take 1 tablet (100 mg total) by mouth 2 (two) times daily. Patient not taking: Reported on 01/30/2015 06/10/14   Sherian Maroon, MD  PARoxetine (PAXIL) 20 MG tablet Take 20 mg by mouth daily.    Historical Provider, MD  tiotropium (SPIRIVA) 18 MCG inhalation capsule Place 18 mcg into inhaler and inhale daily as needed. For shortness of breath    Historical Provider, MD  tolterodine (DETROL) 1 MG tablet Take 1 mg by mouth 2 (two) times daily.    Historical Provider, MD   BP 164/84 mmHg  Pulse 92  Temp(Src) 99 F (  37.2 C) (Oral)  Resp 20  SpO2 94%  LMP 07/14/2013 Physical Exam  Constitutional: She is oriented to person, place, and time. Vital signs are normal. She appears well-developed and well-nourished.  Non-toxic appearance. No distress.  Afebrile, nontoxic, NAD  HENT:  Head: Normocephalic and atraumatic.  Mouth/Throat: Oropharynx is clear and moist and mucous membranes are normal.  Eyes: Conjunctivae and EOM are normal. Right eye exhibits no discharge. Left eye exhibits no discharge.  Neck: Normal range of motion. Neck supple.  Cardiovascular: Normal rate, regular rhythm, normal heart sounds and intact distal pulses.  Exam reveals no gallop and no friction rub.   No murmur heard. Trace pretibial edema bilaterally, unchanged from chronic per pt's report  Pulmonary/Chest: Effort normal. No respiratory distress. She has no decreased breath sounds. She has wheezes. She has rhonchi. She has no rales.  Expiratory wheezing diffusely, some rhonchi which clear with cough, no  rales, no hypoxia or increased WOB, speaking in full sentences, SpO2 94-96% on RA   Abdominal: Soft. Normal appearance and bowel sounds are normal. She exhibits no distension. There is no tenderness. There is no rigidity, no rebound, no guarding and no CVA tenderness.  Musculoskeletal: Normal range of motion.       Lumbar back: She exhibits tenderness and spasm. She exhibits normal range of motion, no bony tenderness and no deformity.       Back:  MAE x4 Strength and sensation grossly intact Distal pulses intact Trace b/l pedal edema, neg homan's bilaterally  Lumbar spine with FROM intact without spinous process TTP, no bony stepoffs or deformities, mild L sided paraspinous muscle TTP and muscle spasms. Strength 5/5 in all extremities, sensation grossly intact in all extremities, negative SLR bilaterally, gait steady and nonantalgic. No overlying skin changes.   Neurological: She is alert and oriented to person, place, and time. She has normal strength. No sensory deficit.  Skin: Skin is warm, dry and intact. No rash noted.  Psychiatric: She is actively hallucinating. She exhibits a depressed mood. She expresses suicidal ideation. She expresses no homicidal ideation. She expresses suicidal plans. She expresses no homicidal plans.  Somewhat depressed appearing, endorsing SI with plan to OD, AVH, but no HI  Nursing note and vitals reviewed.   ED Course  Procedures (including critical care time) Labs Review Labs Reviewed  COMPREHENSIVE METABOLIC PANEL - Abnormal; Notable for the following:    Potassium 3.0 (*)    Glucose, Bld 130 (*)    Calcium 8.8 (*)    Albumin 3.3 (*)    ALT 13 (*)    All other components within normal limits  ACETAMINOPHEN LEVEL - Abnormal; Notable for the following:    Acetaminophen (Tylenol), Serum <10 (*)    All other components within normal limits  CBC - Abnormal; Notable for the following:    RBC 3.62 (*)    Hemoglobin 10.8 (*)    HCT 32.3 (*)    RDW 15.9  (*)    All other components within normal limits  ETHANOL  SALICYLATE LEVEL  URINE RAPID DRUG SCREEN, HOSP PERFORMED    Imaging Review No results found. I have personally reviewed and evaluated these images and lab results as part of my medical decision-making.   EKG Interpretation None      MDM   Final diagnoses:  Suicidal ideations  Hallucinations  Bronchitis    49 y.o. female here for SI with plan as well as hallucinations both auditory and visual. She also has chronic back  pain, chart review reveals that she is prescribed Norco by her orthopedist. Will re-prescribe this, no red flag s/sx for back pain. Will check labs and obtain TTS consult. Patient also has a cough and was prescribed doxycycline which she has not filled yet, on exam she has some wheezing, she is a very been examined by her regular doctor for this therefore will re-prescribe the doxycycline at this time. Will reassess after labs.   4:50 PM Labs reveal mild hypokalemia, will give Kdur now. Mild chronic stable anemia. Pt medically cleared. Please see [facility] notes for further documentation of care. Holding orders in place.   BP 164/84 mmHg  Pulse 92  Temp(Src) 99 F (37.2 C) (Oral)  Resp 20  SpO2 94%  LMP 07/14/2013  Meds ordered this encounter  Medications  . albuterol (PROVENTIL HFA;VENTOLIN HFA) 108 (90 BASE) MCG/ACT inhaler 2 puff    Sig:   . albuterol (PROVENTIL) (2.5 MG/3ML) 0.083% nebulizer solution 2.5 mg    Sig:   . DISCONTD: doxycycline (VIBRAMYCIN) capsule 100 mg    Sig:   . mometasone-formoterol (DULERA) 100-5 MCG/ACT inhaler 2 puff    Sig:   . furosemide (LASIX) tablet 20 mg    Sig:   . hydrOXYzine (VISTARIL) capsule 25 mg    Sig:   . lisinopril (PRINIVIL,ZESTRIL) tablet 10 mg    Sig:   . loratadine (CLARITIN) tablet 10 mg    Sig:   . meloxicam (MOBIC) tablet 15 mg    Sig:   . pantoprazole (PROTONIX) EC tablet 40 mg    Sig:   . orphenadrine (NORFLEX) 12 hr tablet 100 mg    Sig:     . PARoxetine (PAXIL) tablet 20 mg    Sig:   . tiotropium (SPIRIVA) inhalation capsule 18 mcg    Sig:   . oxybutynin (DITROPAN-XL) 24 hr tablet 5 mg    Sig:   . alum & mag hydroxide-simeth (MAALOX/MYLANTA) 200-200-20 MG/5ML suspension 30 mL    Sig:   . ondansetron (ZOFRAN) tablet 4 mg    Sig:   . nicotine (NICODERM CQ - dosed in mg/24 hours) patch 21 mg    Sig:   . zolpidem (AMBIEN) tablet 5 mg    Sig:   . ibuprofen (ADVIL,MOTRIN) tablet 600 mg    Sig:   . DISCONTD: acetaminophen (TYLENOL) tablet 650 mg    Sig:   . LORazepam (ATIVAN) tablet 1 mg    Sig:   . HYDROcodone-acetaminophen (NORCO/VICODIN) 5-325 MG per tablet 1 tablet    Sig:   . potassium chloride SA (K-DUR,KLOR-CON) CR tablet 60 mEq    Sig:   . doxycycline (VIBRAMYCIN) capsule 100 mg    Sig:      Latif Nazareno Camprubi-Soms, PA-C 09/06/15 1652  Gareth Morgan, MD 09/07/15 1515

## 2015-09-06 NOTE — ED Notes (Signed)
Mother taking all of pts belongings with her.
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
## 2015-09-06 NOTE — ED Notes (Addendum)
Patient admits to [facility] with a plan to slit her wrists and  endorses AH, including command, telling her to start using again or to OD. , Patient denies VH at this time. Plan of care discussed with patient. Patient voices no complaints or concerns at this time. Encouragement and support provided and safety maintain. Q 15 min safety checks remain in place.

## 2015-09-06 NOTE — ED Notes (Signed)
Pt transported to [facility] by [facility] transportation service for continuation of specialized care. Belongings given to driver after patient signed for them. Pt left in no acute distress. 
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
## 2015-09-07 ENCOUNTER — Encounter (HOSPITAL_COMMUNITY): Payer: Self-pay | Admitting: Psychiatry

## 2015-09-07 DIAGNOSIS — F1410 Cocaine abuse, uncomplicated: Secondary | ICD-10-CM | POA: Diagnosis present

## 2015-09-07 DIAGNOSIS — F1421 Cocaine dependence, in remission: Secondary | ICD-10-CM | POA: Diagnosis present

## 2015-09-07 DIAGNOSIS — F1221 Cannabis dependence, in remission: Secondary | ICD-10-CM | POA: Diagnosis present

## 2015-09-07 DIAGNOSIS — M51369 Other intervertebral disc degeneration, lumbar region without mention of lumbar back pain or lower extremity pain: Secondary | ICD-10-CM | POA: Diagnosis present

## 2015-09-07 DIAGNOSIS — F1121 Opioid dependence, in remission: Secondary | ICD-10-CM | POA: Diagnosis present

## 2015-09-07 DIAGNOSIS — R45851 Suicidal ideations: Secondary | ICD-10-CM

## 2015-09-07 DIAGNOSIS — F1290 Cannabis use, unspecified, uncomplicated: Secondary | ICD-10-CM

## 2015-09-07 DIAGNOSIS — M5136 Other intervertebral disc degeneration, lumbar region: Secondary | ICD-10-CM | POA: Diagnosis present

## 2015-09-07 DIAGNOSIS — Z8781 Personal history of (healed) traumatic fracture: Secondary | ICD-10-CM

## 2015-09-07 DIAGNOSIS — E876 Hypokalemia: Secondary | ICD-10-CM | POA: Clinically undetermined

## 2015-09-07 DIAGNOSIS — G894 Chronic pain syndrome: Secondary | ICD-10-CM | POA: Diagnosis present

## 2015-09-07 DIAGNOSIS — F250 Schizoaffective disorder, bipolar type: Secondary | ICD-10-CM | POA: Diagnosis present

## 2015-09-07 DIAGNOSIS — F1490 Cocaine use, unspecified, uncomplicated: Secondary | ICD-10-CM

## 2015-09-07 DIAGNOSIS — F1021 Alcohol dependence, in remission: Secondary | ICD-10-CM

## 2015-09-07 LAB — PREGNANCY, URINE: PREG TEST UR: NEGATIVE

## 2015-09-07 MED ORDER — POTASSIUM CHLORIDE CRYS ER 10 MEQ PO TBCR
10.0000 meq | EXTENDED_RELEASE_TABLET | Freq: Two times a day (BID) | ORAL | Status: AC
  Administered 2015-09-07 (×2): 10 meq via ORAL
  Filled 2015-09-07 (×3): qty 1

## 2015-09-07 MED ORDER — IBUPROFEN 600 MG PO TABS
600.0000 mg | ORAL_TABLET | Freq: Four times a day (QID) | ORAL | Status: DC | PRN
  Administered 2015-09-07 – 2015-09-10 (×3): 600 mg via ORAL
  Filled 2015-09-07 (×3): qty 1

## 2015-09-07 MED ORDER — ALBUTEROL SULFATE HFA 108 (90 BASE) MCG/ACT IN AERS
2.0000 | INHALATION_SPRAY | Freq: Four times a day (QID) | RESPIRATORY_TRACT | Status: DC | PRN
  Administered 2015-09-08 – 2015-09-11 (×4): 2 via RESPIRATORY_TRACT

## 2015-09-07 MED ORDER — FUROSEMIDE 10 MG/ML IJ SOLN: 20.0000 mg | Freq: Once | INTRAMUSCULAR | Status: DC

## 2015-09-07 MED ORDER — DIVALPROEX SODIUM 500 MG PO DR TAB: 500.0000 mg | DELAYED_RELEASE_TABLET | Freq: Three times a day (TID) | ORAL | Status: DC

## 2015-09-07 MED ORDER — QUETIAPINE FUMARATE 50 MG PO TABS
150.0000 mg | ORAL_TABLET | Freq: Every day | ORAL | Status: DC
  Administered 2015-09-07: 150 mg via ORAL
  Filled 2015-09-07 (×3): qty 1

## 2015-09-07 MED ORDER — OLANZAPINE 10 MG IM SOLR: 5.0000 mg | Freq: Three times a day (TID) | INTRAMUSCULAR | Status: DC | PRN

## 2015-09-07 MED ORDER — LORAZEPAM 1 MG PO TABS: 1.0000 mg | ORAL_TABLET | Freq: Four times a day (QID) | ORAL | Status: DC | PRN

## 2015-09-07 MED ORDER — NICOTINE 14 MG/24HR TD PT24
14.0000 mg | MEDICATED_PATCH | Freq: Every day | TRANSDERMAL | Status: DC
  Administered 2015-09-07 – 2015-09-12 (×6): 14 mg via TRANSDERMAL
  Filled 2015-09-07 (×9): qty 1

## 2015-09-07 MED ORDER — ALBUTEROL SULFATE HFA 108 (90 BASE) MCG/ACT IN AERS
INHALATION_SPRAY | RESPIRATORY_TRACT | Status: AC
  Administered 2015-09-07: 19:00:00
  Filled 2015-09-07: qty 6.7

## 2015-09-07 MED ORDER — LISINOPRIL 10 MG PO TABS
10.0000 mg | ORAL_TABLET | Freq: Every day | ORAL | Status: DC
  Administered 2015-09-07 – 2015-09-12 (×6): 10 mg via ORAL
  Filled 2015-09-07 (×3): qty 1
  Filled 2015-09-07: qty 7
  Filled 2015-09-07: qty 2
  Filled 2015-09-07 (×4): qty 1

## 2015-09-07 MED ORDER — DIVALPROEX SODIUM 500 MG PO DR TAB
500.0000 mg | DELAYED_RELEASE_TABLET | Freq: Two times a day (BID) | ORAL | Status: DC
  Administered 2015-09-07 – 2015-09-12 (×11): 500 mg via ORAL
  Filled 2015-09-07 (×8): qty 1
  Filled 2015-09-07: qty 14
  Filled 2015-09-07 (×6): qty 1
  Filled 2015-09-07: qty 14
  Filled 2015-09-07 (×2): qty 1

## 2015-09-07 MED ORDER — LIDOCAINE 5 % EX PTCH
1.0000 | MEDICATED_PATCH | CUTANEOUS | Status: DC
  Administered 2015-09-07 – 2015-09-12 (×6): 1 via TRANSDERMAL
  Filled 2015-09-07 (×3): qty 1
  Filled 2015-09-07: qty 7
  Filled 2015-09-07 (×3): qty 1

## 2015-09-07 MED ORDER — OLANZAPINE 5 MG PO TBDP: 5.0000 mg | ORAL_TABLET | Freq: Three times a day (TID) | ORAL | Status: DC | PRN

## 2015-09-07 MED ORDER — LORAZEPAM 2 MG/ML IJ SOLN: 1.0000 mg | Freq: Four times a day (QID) | INTRAMUSCULAR | Status: DC | PRN

## 2015-09-07 MED ORDER — OXYCODONE HCL 5 MG PO TABS
5.0000 mg | ORAL_TABLET | Freq: Every day | ORAL | Status: DC | PRN
  Administered 2015-09-07 – 2015-09-11 (×4): 5 mg via ORAL
  Filled 2015-09-07 (×4): qty 1
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
## 2015-09-07 NOTE — Tx Team (Signed)
Interdisciplinary Treatment Plan Update (Adult)  Date:  09/07/2015 Time Reviewed:  11:58 AM  Progress in Treatment: Attending groups: Yes. Participating in groups: Yes. Taking medication as prescribed:  Yes. Tolerating medication:  Yes. Family/Significant othe contact made: Yes Patient understands diagnosis:  Yes, as evidenced by seeking help with AVH, SI, and HI. Discussing patient identified problems/goals with staff:  Yes, see initial care plan. Medical problems stabilized or resolved:  Yes Denies suicidal/homicidal ideation: Yes. Issues/concerns per patient self-inventory: No. Other:  New problem(s) identified:   Discharge Plan or Barriers: See below  Reason for Continuation of Hospitalization: Depression Hallucinations Homicidal ideation Medication stabilization Suicidal ideation  Comments: Alicia Blake is an 49 y.o. female who voluntarily presents to [facility] with active SI w/ a plan to slit her wrists. Pt shared that she has been abusing Crack, THC, Heroin, ETOH, & pills for "all my life" and has now been clean for 18 months. She reported that it's an everyday struggle to continue to stay clean. Pt reports that she goes to [facility]. Pt shared that she has been feeling like she "just don't want to live no more" for about 3 months and the feeling has gotten "stronger" within the last month. Pt has therapy and psychiatric services at [facility], but admits to being non compliant in therapy and in going to her NA meeting regularly. Pt indicates that her hometown is [place], [place] and she still has medical doctors in [place] that she sees frequently. Pt doesn't have a license, so her mother takes her back and forth, but when then travel to [place] for the appts, they will end up staying there "2 and 3 weeks". Pt states that this is the reason why she has been non compliant. Pt believes that her SI could be resolved by some "heavier medications", as she thinks the ones  she's taking is not working.  Pt denies HI, but endorses AVH, including command, telling her to start using again or to OD.  Pt is oriented x 4. Her mood was apathetic and her affect was flat. Depakote, Seroquel trial    Estimated length of stay: 4-5 days  New goal(s):  Review of initial/current patient goals per problem list:  1. Goal(s): Patient will participate in aftercare plan  Met:Yes   Target date: at discharge  As evidenced by: Patient will participate within aftercare plan AEB aftercare provider and housing plan at discharge being identified.  09/07/15: Pt will return home and follow-up outpt with [facility] and [facility].  2. Goal (s): Patient will exhibit decreased depressive symptoms and suicidal ideations.  Met: No   Target date: at discharge  As evidenced by: Patient will utilize self rating of depression at 3 or below and demonstrate decreased signs of depression or be deemed stable for discharge by MD.  09/07/15: Pt endorses SI and presents with a flat affect. Will continue to assess.   4. Goal(s): Patient will demonstrate decreased signs of psychosis.  Met: No  Target date:at discharge  As evidenced by: Patient will demonstrate decreased signs of psychosis as evidenced by a reduction in AVH, paranoia, and/or delusions.   09/07/15: Pt endorses AVH and paranoia.    Attendees: Patient:  09/07/2015 11:58 AM  Family:   09/07/2015 11:58 AM  Physician:  Dr. Ursula Alert, MD 09/07/2015 11:58 AM  Nursing:   Grayland Ormond, RN 09/07/2015 11:58 AM  Case Manager:  Roque Lias, LCSW 09/07/2015 11:58 AM  Counselor:  Matthew Saras, MSW  Intern 09/07/2015 11:58 AM  Other:   09/07/2015 11:58 AM  Other:   09/07/2015 11:58 AM  Other:   09/07/2015 11:58 AM  Other:  09/07/2015 11:58 AM  Other:    Other:    Other:    Other:    Other:    Other:      Scribe for Treatment Team:   Georga Kaufmann, MSW Intern 09/07/2015 11:58  AM
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
## 2015-09-07 NOTE — BHH Group Notes (Signed)
[facility] Group Notes:  (Counselor/Nursing/MHT/Case Management/Adjunct)  09/07/2015 1:15PM  Type of Therapy:  Group Therapy  Participation Level:  Active  Participation Quality:  Appropriate  Affect:  Flat  Cognitive:  Oriented  Insight:  Improving  Engagement in Group:  Limited  Engagement in Therapy:  Limited  Modes of Intervention:  Discussion, Exploration and Socialization  Summary of Progress/Problems: The topic for group was balance in life.  Pt participated in the discussion about when their life was in balance and out of balance and how this feels.  Pt discussed ways to get back in balance and short term goals they can work on to get where they want to be.  "My mom is in my ear all the time.  I gotta cut the biblical cord.  Bipolar kicks in.  Substance abuse kicks in. Schizophrenia kicks in.  I've been going to [facility].  I have a sponsor.  I've been clean 16 mos."  Went on to talk about surrender re: recovery, and how that has made all the difference.  Talked about her higher power and working the 12 steps.  "I'm struggling because certain things were in my mind.  So I came here to get help."  Suggested resource of [facility] housing to another patient, and said she stayed there for 6 months or so in the past. Also admitted to having been incarcerated in the past.  "Been there, done that.  I've been staying away from that."   Roque Lias B 09/07/2015 1:28 PM

## 2015-09-07 NOTE — BHH Group Notes (Signed)
[facility] Group Notes:  (Nursing/MHT/Case Management/Adjunct)  Date:  09/07/2015  Time: 0930 Type of Therapy:  Nurse Education  Participation Level:  Did Not Attend                 Alicia Blake 09/07/2015, 7:00 PM

## 2015-09-07 NOTE — Progress Notes (Signed)
Patient ID: Alicia Blake, female   DOB: 11-19-1965, 49 y.o.   MRN: [id number] D: Client visible on the unit, reports depression "8" of 10. "heard voices earlier today, but just whispers" "I like the groups, they talked about obstacles in your way" "I'm my obstacle" "I see myself going back to old way which is the way I acted when I was using and I'm not going to use any more" "not going back to that life" Client has visit from mom today. A: Writer reviews medications, administers as ordered, encourages group. Staff will monitor q47min for safety. R: Client is safe on the unit, attends karaoke.

## 2015-09-07 NOTE — Progress Notes (Signed)
DAR NOTE: Patient presents with anxious affect and depressed mood.  Reports pain, auditory and visual hallucinations.  Reports having suicidal thoughts but contracts for safety.  Rates depression at 9, hopelessness at 10, and anxiety at 10.  Maintained on routine safety checks.  Medications given as prescribed.  Support and encouragement offered as needed.  States goal for today is "my mind, thoughts, depression, anxiety, hypertension, lower back, high blood pressure, and pain.  Patient stayed in her room most of the shift.

## 2015-09-07 NOTE — Progress Notes (Signed)
Pt attended evening karaoke group.

## 2015-09-08 DIAGNOSIS — F1110 Opioid abuse, uncomplicated: Secondary | ICD-10-CM | POA: Clinically undetermined

## 2015-09-08 MED ORDER — QUETIAPINE FUMARATE 300 MG PO TABS
300.0000 mg | ORAL_TABLET | Freq: Every day | ORAL | Status: DC
  Administered 2015-09-08 – 2015-09-11 (×4): 300 mg via ORAL
  Filled 2015-09-08 (×5): qty 1
  Filled 2015-09-08: qty 7

## 2015-09-08 NOTE — BHH Suicide Risk Assessment (Signed)
[facility] INPATIENT:  Family/Significant Other Suicide Prevention Education  Suicide Prevention Education:  Education Completed; Dilys Minturn (mom) [phone] has been identified by the patient as the family member/significant other with whom the patient will be residing, and identified as the person(s) who will aid the patient in the event of a mental health crisis (suicidal ideations/suicide attempt).  With written consent from the patient, the family member/significant other has been provided the following suicide prevention education, prior to the and/or following the discharge of the patient.  The suicide prevention education provided includes the following:  Suicide risk factors  Suicide prevention and interventions  National Suicide Hotline telephone number  [facility] assessment telephone number  [facility] Emergency Assistance [place] and/or Residential Mobile Crisis Unit telephone number  Request made of family/significant other to:  Remove weapons (e.g., guns, rifles, knives), all items previously/currently identified as safety concern.    Remove drugs/medications (over-the-counter, prescriptions, illicit drugs), all items previously/currently identified as a safety concern.  The family member/significant other verbalizes understanding of the suicide prevention education information provided.  The family member/significant other agrees to remove the items of safety concern listed above.  Georga Kaufmann 09/08/2015, 1:01 PM

## 2015-09-08 NOTE — Progress Notes (Signed)
[facility] MD Progress Note  09/08/2015 2:08 PM Alicia Blake  MRN:  [id number] Subjective: Patient states " I am depressed and suicidal . I still have Alicia Blake.'   Objective:Alicia Blake is a 49 year old AAF , who is single , unemployed , lives by self in [place] , has a hx of several different diagnosis of MDD, bipolar , schizoaffective do as well as schizophrenia ( per patient report) , presented to the ED with worsening depression as well as SI with plan to cut self or OD on pills.   Patient seen and chart reviewed.Discussed patient with treatment team. Pt today seen in bed, appears withdrawn , isolative. Pt continues to display depressive sx , and rates her depression at an 8/10 today. Pt also has SI , denies paln. Pt also has AH of whispers . She also has insomnia and feels seroquel may be helping to some extent. Pt also has chronic pain and has been very medication seeking. Per her pharmacy she had filled her script for opioids last week of October , pt's UDS however was not positive on 09/06/15. Pt reports that she was using atleast 3 pills a day and hence ran out. Discussed with pt the risks of misusing pain medications as well as not following instructions by her Provider.  Pt is currently restarted on oxycodone 5 mg daily prn along with other alternative pain management. Per nursing pt continues to be demanding , medication seeking , appears withdrawn and isolative to her room mostly.       Principal Problem: Schizoaffective disorder, bipolar type ([place]) Diagnosis:   Patient Active Problem List   Diagnosis Date Noted  . Opioid use disorder, mild, abuse [F11.10] 09/08/2015  . Hypokalemia [E87.6] 09/07/2015  . Schizoaffective disorder, bipolar type ([place]) [F25.0] 09/07/2015  . Chronic pain syndrome [G89.4] 09/07/2015  . Alcohol use disorder, moderate, in sustained remission ([place]) [F10.21] 09/07/2015  . Cocaine use disorder, moderate, in sustained remission [F14.90] 09/07/2015  . Cannabis use disorder,  moderate, in sustained remission [F12.90] 09/07/2015  . DDD (degenerative disc disease), lumbar [M51.36] 09/07/2015  . Hx of fracture of ankle [Z87.81] 09/07/2015  . Symptomatic Fibroids [D25.9] 08/09/2013  . Breast lump on left side at 9 o'clock position [N63] 06/29/2013  . Breast discharge [N64.52] 06/29/2013   Total Time spent with patient: 30 minutes  Past Psychiatric History: Pt was diagnosed with depression , Bipolar , schizophrenia as well as PTSD in the past ( per pt report). Pt reports several hospitalizations - [facility] , [place] , [place] , [place] . Pt reports she had an ACTT who helped with her housing. Pt reports hx of suicide attempt x1 in the past by OD. Pt reports hx of being on Lithium. Pt follows up with family services and also gets therapy from [facility] in [place].   Past Medical History:  Past Medical History  Diagnosis Date  . Hypertension   . Asthma   . Bronchitis   . GERD (gastroesophageal reflux disease)   . Thyroid disease   . PTSD (post-traumatic stress disorder)   . Bipolar affect, depressed ([place])   . Chronic back pain     Past Surgical History  Procedure Laterality Date  . Neck surgery    . Abdominal surgery     Family History:  Family History  Problem Relation Age of Onset  . Hypertension Mother   . Hypertension Maternal Grandmother   . Hypertension Paternal Grandmother   . Diabetes Paternal Grandmother   .  Hypertension Paternal Grandfather   . Diabetes Paternal Grandfather   . Alcohol abuse Paternal Aunt   . Mental illness Cousin    Family Psychiatric  History: Pt reports cousin has mental illness and that paternal aunt has a hx of alcohol abuse Social History: Pt currently lives in an apartment in [place] , her mother lives with her to help her out. Pt has applied for SSD. Pt has a son who is 7 years old , lives in [place]. Pt is currently unemployed , denies legal issues. History  Alcohol Use  . Yes    Comment: quit  date 01/17/2013     History  Drug Use  . Yes  . Special: "Crack" cocaine    Comment: quit date 01/17/2013    Social History   Social History  . Marital Status: Single    Spouse Name: N/A  . Number of Children: N/A  . Years of Education: N/A   Social History Main Topics  . Smoking status: Current Every Day Smoker -- 0.25 packs/day    Types: Cigarettes  . Smokeless tobacco: None  . Alcohol Use: Yes     Comment: quit date 01/17/2013  . Drug Use: Yes    Special: "Crack" cocaine     Comment: quit date 01/17/2013  . Sexual Activity: Yes    Birth Control/ Protection: None   Other Topics Concern  . None   Social History Narrative   Additional Social History:    History of alcohol / drug use?: Yes                    Sleep: Poor  Appetite:  Poor  Current Medications: Current Facility-Administered Medications  Medication Dose Route Frequency Provider Last Rate Last Dose  . albuterol (PROVENTIL HFA;VENTOLIN HFA) 108 (90 BASE) MCG/ACT inhaler 2 puff  2 puff Inhalation Q6H PRN Derrill Center, NP   2 puff at 09/08/15 0234  . alum & mag hydroxide-simeth (MAALOX/MYLANTA) 200-200-20 MG/5ML suspension 30 mL  30 mL Oral Q4H PRN Ursula Alert, MD   30 mL at 09/08/15 0233  . divalproex (DEPAKOTE) DR tablet 500 mg  500 mg Oral Q12H Handsome Anglin, MD   500 mg at 09/08/15 0824  . hydrOXYzine (ATARAX/VISTARIL) tablet 25 mg  25 mg Oral Q6H PRN Laverle Hobby, PA-C   25 mg at 09/07/15 2154  . ibuprofen (ADVIL,MOTRIN) tablet 600 mg  600 mg Oral Q6H PRN Ursula Alert, MD   600 mg at 09/07/15 2154  . lidocaine (LIDODERM) 5 % 1 patch  1 patch Transdermal Q24H Ursula Alert, MD   1 patch at 09/08/15 1209  . lisinopril (PRINIVIL,ZESTRIL) tablet 10 mg  10 mg Oral Daily Derrill Center, NP   10 mg at 09/08/15 0824  . magnesium hydroxide (MILK OF MAGNESIA) suspension 30 mL  30 mL Oral Daily PRN Ursula Alert, MD      . nicotine (NICODERM CQ - dosed in mg/24 hours) patch 14 mg  14 mg  Transdermal Daily Ursula Alert, MD   14 mg at 09/08/15 0826  . OLANZapine zydis (ZYPREXA) disintegrating tablet 5 mg  5 mg Oral TID PRN Ursula Alert, MD       Or  . OLANZapine (ZYPREXA) injection 5 mg  5 mg Intramuscular TID PRN Ursula Alert, MD      . oxyCODONE (Oxy IR/ROXICODONE) immediate release tablet 5 mg  5 mg Oral Daily PRN Ursula Alert, MD   5 mg at 09/07/15 1646  .  QUEtiapine (SEROQUEL) tablet 300 mg  300 mg Oral QHS Ursula Alert, MD        Lab Results:  Results for orders placed or performed during the hospital encounter of 09/06/15 (from the past 48 hour(s))  Pregnancy, urine     Status: None   Collection Time: 09/07/15  6:30 PM  Result Value Ref Range   Preg Test, Ur NEGATIVE NEGATIVE    Comment:        THE SENSITIVITY OF THIS METHODOLOGY IS >20 mIU/mL. Performed at [facility]     Physical Findings: AIMS: Facial and Oral Movements Muscles of Facial Expression: None, normal Lips and Perioral Area: None, normal Jaw: None, normal Tongue: None, normal,Extremity Movements Upper (arms, wrists, hands, fingers): None, normal Lower (legs, knees, ankles, toes): None, normal, Trunk Movements Neck, shoulders, hips: None, normal, Overall Severity Severity of abnormal movements (highest score from questions above): None, normal Incapacitation due to abnormal movements: None, normal Patient's awareness of abnormal movements (rate only patient's report): No Awareness, Dental Status Current problems with teeth and/or dentures?: No Does patient usually wear dentures?: No  CIWA:  CIWA-Ar Total: 0 COWS:     Musculoskeletal: Strength & Muscle Tone: within normal limits Gait & Station: normal Patient leans: N/A  Psychiatric Specialty Exam: Review of Systems  Musculoskeletal: Positive for back pain.  Psychiatric/Behavioral: Positive for depression, suicidal ideas, hallucinations and substance abuse. The patient is nervous/anxious and has insomnia.    All other systems reviewed and are negative.   Blood pressure 152/93, pulse 100, temperature 98.6 F (37 C), temperature source Oral, resp. rate 16, height 5' 3.5" (1.613 m), weight 112.946 kg (249 lb), last menstrual period 07/14/2013.Body mass index is 43.41 kg/(m^2).  General Appearance: Disheveled  Eye Sport and exercise psychologist::  Fair  Speech:  Slow  Volume:  Decreased  Mood:  Anxious and Depressed  Affect:  Depressed  Thought Process:  Coherent  Orientation:  Full (Time, Place, and Person)  Thought Content:  Hallucinations: Auditory and Rumination  Suicidal Thoughts:  Yes.  without intent/plan  Homicidal Thoughts:  No  Memory:  Immediate;   Fair Recent;   Fair Remote;   Fair  Judgement:  Impaired  Insight:  Lacking  Psychomotor Activity:  Decreased  Concentration:  Poor  Recall:  [employer] of Knowledge:Fair  Language: Fair  Akathisia:  No  Handed:  Right  AIMS (if indicated):     Assets:  Communication Skills  ADL's:  Intact  Cognition: WNL  Sleep:  Number of Hours: 6.25   Treatment Plan Summary:Claudia is a 49 year old AAF  With schizoaffective do , chronic pain, opioid use disorder, presented with worsening depression as well as psychosis. Pt will continue to need inpatient stay.  Daily contact with patient to assess and evaluate symptoms and progress in treatment and Medication management  Will continue Depakote DR 500 mg po bid for mood sx. Depakote level on 09/11/15. Will increase Seroquel to 300 mg po qhs for sleep, psychosis. Will continue vistaril 25 mg po tid prn for anxiety sx. Will also provide Zyprexa 5 mg po /IM tid prn for severe anxiety/agitation. Will continue pain management - Lidocaine patch as well as motrin 600 mg prn. Continue  Oxycodone 5 mg daily prn for severe pain - called [facility] at [place] , [place] - she was being prescribe the same By Dr.Mark Kathryne Hitch . Will continue to monitor vitals ,medication compliance and treatment side effects while patient  is here.  Will monitor for  medical issues as well as call consult as needed.  Reviewed labs CBC -hb/hct low ( likely due to chronic disease) , cmp - K+ low - replaced with Kdur 10 meq x 2 doses and repeat BMP tomorrow . Urine pregnancy test - negative. Pending TSH,lipid panel, hba1c, pl . CSW will start working on disposition.  Patient to participate in therapeutic milieu .    Anastassia Noack MD 09/08/2015, 2:08 PM

## 2015-09-08 NOTE — BHH Group Notes (Signed)
[facility] LCSW Aftercare Discharge Planning Group Note   09/08/2015 10:15 AM  Participation Quality:  Invited. Chose not attend.   Georga Kaufmann

## 2015-09-08 NOTE — Progress Notes (Signed)
Patient ID: Alicia Blake, female   DOB: Feb 22, 1966, 49 y.o.   MRN: [id number] D: Client in bed earlier this evening reports agitation with previous roommate "she was over there playing with her self, had her fingers in her private" "her sickness is not my sickness, we was clashing" Client reports depression "8" of 10. " had a spirituality group today" A: Writer provided emotional support encouraged client to speak to physician about home medication. Reviewed medications, administered as ordered. Staff will monitor q98min for safety. R: Client is safe on the unit, attended group.

## 2015-09-08 NOTE — BHH Group Notes (Signed)
[facility] LCSW Group Therapy   09/08/2015 1:37 PM  Type of Therapy: Group Therapy  Participation Level:  Active  Participation Quality:  Attentive  Affect:  Flat  Cognitive:  Oriented  Insight:  Limited  Engagement in Therapy:  Engaged  Modes of Intervention:  Discussion and Socialization  Summary of Progress/Problems: Chaplain was here to lead a group on themes of hope and/or courage.   Pt walked in midway through the group. Pt began talking about how important her mother is to her and how supportive she has been. Pt became tearful when sharing how much her mother has done for her. "Sometimes she does too much. I know it's too much". Pt mentioned that she feels like talking to someone on one may be helpful for her. "i'm miserable and I don't want to be miserable. I came here to get better not worse".  Georga Kaufmann 09/08/2015 1:37 PM

## 2015-09-08 NOTE — Progress Notes (Signed)
DAR NOTE: Patient presents with anxious affect and depressed mood.  Denies auditory and visual hallucinations.  Rates depression at 9, hopelessness at 8, and anxiety at 10.  Maintained on routine safety checks.  Medications given as prescribed.  Support and encouragement offered as needed.  Attended group and participated.  States goal for today is "depression, getting motivated, going home, getting better and stable meds."  Patient observed socializing with peers in the dayroom.  Patient complain of left hip pain of 8/10.  Requested and received Oxycodone IR 5 mg.

## 2015-09-09 LAB — BASIC METABOLIC PANEL
Anion gap: 9 (ref 5–15)
BUN: 12 mg/dL (ref 6–20)
CHLORIDE: 100 mmol/L — AB (ref 101–111)
CO2: 29 mmol/L (ref 22–32)
Calcium: 9.4 mg/dL (ref 8.9–10.3)
Creatinine, Ser: 0.96 mg/dL (ref 0.44–1.00)
GFR calc non Af Amer: >60 mL/min (ref >60)
Glucose, Bld: 107 mg/dL — ABNORMAL HIGH (ref 65–99)
POTASSIUM: 3.7 mmol/L (ref 3.5–5.1)
SODIUM: 138 mmol/L (ref 135–145)

## 2015-09-09 LAB — TSH: TSH: 2.293 u[IU]/mL (ref 0.350–4.500)

## 2015-09-09 LAB — LIPID PANEL
CHOLESTEROL: 145 mg/dL (ref 0–200)
HDL: 48 mg/dL (ref >40)
LDL Cholesterol: 84 mg/dL (ref 0–99)
TRIGLYCERIDES: 64 mg/dL (ref <150)
Total CHOL/HDL Ratio: 3 RATIO
VLDL: 13 mg/dL (ref 0–40)

## 2015-09-09 MED ORDER — DOXYCYCLINE HYCLATE 100 MG PO TABS
100.0000 mg | ORAL_TABLET | Freq: Two times a day (BID) | ORAL | Status: DC
  Administered 2015-09-09 – 2015-09-12 (×7): 100 mg via ORAL
  Filled 2015-09-09 (×5): qty 1
  Filled 2015-09-09: qty 8
  Filled 2015-09-09 (×3): qty 1
  Filled 2015-09-09: qty 8
  Filled 2015-09-09: qty 1

## 2015-09-09 NOTE — Plan of Care (Signed)
Problem: Diagnosis: Increased Risk For Suicide Attempt Goal: STG-Patient Will Comply With Medication Regime Outcome: Progressing Patient is compliant with medication regime.     

## 2015-09-09 NOTE — Progress Notes (Signed)
D: Pt is alert and oriented x4. Pt complained of severe depression of 8 on a 0-10 depression scale. Pt also verbalizes moderate anxiety of 5 on a 0-10 anxiety scale. She states, "I let both my depression and anxiety go on for a long, long time; that was a big mistake." Pt also complained of moderate hip pain 6 on a 0-10 pain scale. Pt at this time however, denies SI/HI and AVH. Pt may be med seeking. Pt remained calm and cooperative through the assessment.  A: Pt was encouraged to attend group. Medications offered meds as prescribed.  Support, encouragement, and safe environment provided.  15-minute safety checks continue.  R: Pt was med compliant.  Attended group. Safety checks continue.

## 2015-09-09 NOTE — [facility] Group Notes (Signed)
[facility] Group Notes:  (Clinical Social Work)  09/09/2015  11:15-12:00PM  Summary of Progress/Problems:   Today's process group involved patients discussing their feelings related to being hospitalized, as well as how they want to feel in order to be ready to discharge.  It was agreed in general by the group that it would be preferable to avoid future hospitalizations, and there was a discussion about what each person will need to do to achieve that.   Problems related to adherence to medication recommendations were discussed, as well as importance of developing friendships and supports.  The patient expressed her primary feeling about being hospitalized was initially that it was a good thing to come in the hospital and "see what's going on, get stable" but now she is ready to go.  She participated in group with a lot of nodding in agreement with others, but not a lot of actually talking.  Type of Therapy:  Group Therapy - Process  Participation Level:  Active  Participation Quality:  Attentive  Affect:  Blunted  Cognitive:  Appropriate  Insight:  Developing/Improving  Engagement in Therapy:  Developing/Improving  Modes of Intervention:  Exploration, Discussion  Selmer Dominion, LCSW 09/09/2015, 1:02 PM

## 2015-09-09 NOTE — Progress Notes (Signed)
[facility] MD Progress Note  09/09/2015 5:04 PM Alicia Blake  MRN:  [id number] Subjective: Patient states " I am a bit better.'    Objective:Alicia Blake is a 49 year old AAF , who is single , unemployed , lives by self in [place] , has a hx of several different diagnosis of MDD, bipolar , schizoaffective do as well as schizophrenia ( per patient report) , presented to the ED with worsening depression as well as SI with plan to cut self or OD on pills.   Patient seen and chart reviewed.Discussed patient with treatment team. Pt today seen in bed, continues to appears withdrawn , isolative. Pt however states her depressive sx are improving. She has been tolerating her medications well. She denies any ADRs of medications. Pt continues to be focussed on her pain medications- discussed misuse/abuse hx, discussed making use of alternative pain management.  Per nursing pt continues to be demanding , medication seeking , appears withdrawn and isolative to her room mostly.Will continue to support.       Principal Problem: Schizoaffective disorder, bipolar type ([place]) Diagnosis:   Patient Active Problem List   Diagnosis Date Noted  . Opioid use disorder, mild, abuse [F11.10] 09/08/2015  . Hypokalemia [E87.6] 09/07/2015  . Schizoaffective disorder, bipolar type ([place]) [F25.0] 09/07/2015  . Chronic pain syndrome [G89.4] 09/07/2015  . Alcohol use disorder, moderate, in sustained remission ([place]) [F10.21] 09/07/2015  . Cocaine use disorder, moderate, in sustained remission [F14.90] 09/07/2015  . Cannabis use disorder, moderate, in sustained remission [F12.90] 09/07/2015  . DDD (degenerative disc disease), lumbar [M51.36] 09/07/2015  . Hx of fracture of ankle [Z87.81] 09/07/2015  . Symptomatic Fibroids [D25.9] 08/09/2013  . Breast lump on left side at 9 o'clock position [N63] 06/29/2013  . Breast discharge [N64.52] 06/29/2013   Total Time spent with patient: 30 minutes  Past Psychiatric History: Pt was  diagnosed with depression , Bipolar , schizophrenia as well as PTSD in the past ( per pt report). Pt reports several hospitalizations - [facility] , [place] , [place] , [place] . Pt reports she had an ACTT who helped with her housing. Pt reports hx of suicide attempt x1 in the past by OD. Pt reports hx of being on Lithium. Pt follows up with family services and also gets therapy from [facility] [place] [facility] in [place].   Past Medical History:  Past Medical History  Diagnosis Date  . Hypertension   . Asthma   . Bronchitis   . GERD (gastroesophageal reflux disease)   . Thyroid disease   . PTSD (post-traumatic stress disorder)   . Bipolar affect, depressed ([place])   . Chronic back pain     Past Surgical History  Procedure Laterality Date  . Neck surgery    . Abdominal surgery     Family History:  Family History  Problem Relation Age of Onset  . Hypertension Mother   . Hypertension Maternal Grandmother   . Hypertension Paternal Grandmother   . Diabetes Paternal Grandmother   . Hypertension Paternal Grandfather   . Diabetes Paternal Grandfather   . Alcohol abuse Paternal Aunt   . Mental illness Cousin    Family Psychiatric  History: Pt reports cousin has mental illness and that paternal aunt has a hx of alcohol abuse Social History: Pt currently lives in an apartment in [place] , her mother lives with her to help her out. Pt has applied for SSD. Pt has a son who is 71 years old , lives in [place].  Pt is currently unemployed , denies legal issues. History  Alcohol Use  . Yes    Comment: quit date 01/17/2013     History  Drug Use  . Yes  . Special: "Crack" cocaine    Comment: quit date 01/17/2013    Social History   Social History  . Marital Status: Single    Spouse Name: N/A  . Number of Children: N/A  . Years of Education: N/A   Social History Main Topics  . Smoking status: Current Every Day Smoker -- 0.25 packs/day    Types: Cigarettes  . Smokeless tobacco: None  .  Alcohol Use: Yes     Comment: quit date 01/17/2013  . Drug Use: Yes    Special: "Crack" cocaine     Comment: quit date 01/17/2013  . Sexual Activity: Yes    Birth Control/ Protection: None   Other Topics Concern  . None   Social History Narrative   Additional Social History:    History of alcohol / drug use?: Yes                    Sleep: Fair  Appetite:  Poor  Current Medications: Current Facility-Administered Medications  Medication Dose Route Frequency Provider Last Rate Last Dose  . albuterol (PROVENTIL HFA;VENTOLIN HFA) 108 (90 BASE) MCG/ACT inhaler 2 puff  2 puff Inhalation Q6H PRN Derrill Center, NP   2 puff at 09/08/15 2341  . alum & mag hydroxide-simeth (MAALOX/MYLANTA) 200-200-20 MG/5ML suspension 30 mL  30 mL Oral Q4H PRN Ursula Alert, MD   30 mL at 09/09/15 1118  . divalproex (DEPAKOTE) DR tablet 500 mg  500 mg Oral Q12H Ursula Alert, MD   500 mg at 09/09/15 0823  . doxycycline (VIBRA-TABS) tablet 100 mg  100 mg Oral Q12H Emillio Ngo, MD   100 mg at 09/09/15 1116  . hydrOXYzine (ATARAX/VISTARIL) tablet 25 mg  25 mg Oral Q6H PRN Laverle Hobby, PA-C   25 mg at 09/07/15 2154  . ibuprofen (ADVIL,MOTRIN) tablet 600 mg  600 mg Oral Q6H PRN Ursula Alert, MD   600 mg at 09/07/15 2154  . lidocaine (LIDODERM) 5 % 1 patch  1 patch Transdermal Q24H Ursula Alert, MD   1 patch at 09/09/15 1117  . lisinopril (PRINIVIL,ZESTRIL) tablet 10 mg  10 mg Oral Daily Derrill Center, NP   10 mg at 09/09/15 0823  . magnesium hydroxide (MILK OF MAGNESIA) suspension 30 mL  30 mL Oral Daily PRN Ursula Alert, MD      . nicotine (NICODERM CQ - dosed in mg/24 hours) patch 14 mg  14 mg Transdermal Daily Ursula Alert, MD   14 mg at 09/09/15 0827  . OLANZapine zydis (ZYPREXA) disintegrating tablet 5 mg  5 mg Oral TID PRN Ursula Alert, MD       Or  . OLANZapine (ZYPREXA) injection 5 mg  5 mg Intramuscular TID PRN Ursula Alert, MD      . oxyCODONE (Oxy IR/ROXICODONE)  immediate release tablet 5 mg  5 mg Oral Daily PRN Ursula Alert, MD   5 mg at 09/08/15 1816  . QUEtiapine (SEROQUEL) tablet 300 mg  300 mg Oral QHS Ursula Alert, MD   300 mg at 09/08/15 2153    Lab Results:  Results for orders placed or performed during the hospital encounter of 09/06/15 (from the past 48 hour(s))  Pregnancy, urine     Status: None   Collection Time: 09/07/15  6:30 PM  Result Value Ref Range   Preg Test, Ur NEGATIVE NEGATIVE    Comment:        THE SENSITIVITY OF THIS METHODOLOGY IS >20 mIU/mL. Performed at [facility]   Lipid panel     Status: None   Collection Time: 09/09/15  6:40 AM  Result Value Ref Range   Cholesterol 145 0 - 200 mg/dL   Triglycerides 64 <150 mg/dL   HDL 48 >40 mg/dL   Total CHOL/HDL Ratio 3.0 RATIO   VLDL 13 0 - 40 mg/dL   LDL Cholesterol 84 0 - 99 mg/dL    Comment:        Total Cholesterol/HDL:CHD Risk Coronary Heart Disease Risk Table                     Men   Women  1/2 Average Risk   3.4   3.3  Average Risk       5.0   4.4  2 X Average Risk   9.6   7.1  3 X Average Risk  23.4   11.0        Use the calculated Patient Ratio above and the CHD Risk Table to determine the patient's CHD Risk.        ATP III CLASSIFICATION (LDL):  <100     mg/dL   Optimal  100-129  mg/dL   Near or Above                    Optimal  130-159  mg/dL   Borderline  160-189  mg/dL   High  >190     mg/dL   Very High Performed at [facility]   TSH     Status: None   Collection Time: 09/09/15  6:40 AM  Result Value Ref Range   TSH 2.293 0.350 - 4.500 uIU/mL    Comment: Performed at [facility]    Physical Findings: AIMS: Facial and Oral Movements Muscles of Facial Expression: None, normal Lips and Perioral Area: None, normal Jaw: None, normal Tongue: None, normal,Extremity Movements Upper (arms, wrists, hands, fingers): None, normal Lower (legs, knees, ankles, toes): None, normal, Trunk  Movements Neck, shoulders, hips: None, normal, Overall Severity Severity of abnormal movements (highest score from questions above): None, normal Incapacitation due to abnormal movements: None, normal Patient's awareness of abnormal movements (rate only patient's report): No Awareness, Dental Status Current problems with teeth and/or dentures?: No Does patient usually wear dentures?: No  CIWA:  CIWA-Ar Total: 0 COWS:     Musculoskeletal: Strength & Muscle Tone: within normal limits Gait & Station: normal Patient leans: N/A  Psychiatric Specialty Exam: Review of Systems  Musculoskeletal: Positive for back pain.  Psychiatric/Behavioral: Positive for depression, hallucinations and substance abuse. The patient is nervous/anxious.   All other systems reviewed and are negative.   Blood pressure 149/87, pulse 98, temperature 98.7 F (37.1 C), temperature source Oral, resp. rate 16, height 5' 3.5" (1.613 m), weight 112.946 kg (249 lb), last menstrual period 07/14/2013.Body mass index is 43.41 kg/(m^2).  General Appearance: Fairly Groomed  Engineer, water::  Fair  Speech:  Slow  Volume:  Decreased  Mood:  Anxious and Depressed improving  Affect:  Depressed  Thought Process:  Coherent  Orientation:  Full (Time, Place, and Person)  Thought Content:  Hallucinations: Auditory and Rumination improving  Suicidal Thoughts:  No  Homicidal Thoughts:  No  Memory:  Immediate;   Fair Recent;  Fair Remote;   Fair  Judgement:  Impaired  Insight:  Lacking  Psychomotor Activity:  Decreased  Concentration:  Poor  Recall:  [employer] of Knowledge:Fair  Language: Fair  Akathisia:  No  Handed:  Right  AIMS (if indicated):     Assets:  Communication Skills  ADL's:  Intact  Cognition: WNL  Sleep:  Number of Hours: 6.5   Treatment Plan Summary:Puanani is a 49 year old AAF  With schizoaffective do , chronic pain, opioid use disorder, presented with worsening depression as well as psychosis. Pt will  continue to need inpatient stay.  Daily contact with patient to assess and evaluate symptoms and progress in treatment and Medication management  Will continue Depakote DR 500 mg po bid for mood sx. Depakote level on 09/11/15. Will continue Seroquel 300 mg po qhs for sleep, psychosis. Will continue vistaril 25 mg po tid prn for anxiety sx. Will also provide Zyprexa 5 mg po /IM tid prn for severe anxiety/agitation. Will continue pain management - Lidocaine patch as well as motrin 600 mg prn. Continue  Oxycodone 5 mg daily prn for severe pain - called [place] at [place] , [place] - she was being prescribe the same By Dr.Mark Kathryne Hitch . Will continue to monitor vitals ,medication compliance and treatment side effects while patient is here.  Will monitor for medical issues as well as call consult as needed.  Reviewed labs CBC -hb/hct low ( likely due to chronic disease) , cmp - K+ low - replaced with Kdur 10 meq x 2 doses , pending BMP  . ,lipid panel- wnl . CSW will start working on disposition.  Patient to participate in therapeutic milieu .    Fritz Cauthon MD 09/09/2015, 5:04 PM

## 2015-09-09 NOTE — Progress Notes (Signed)
Adult Psychoeducational Group Note  Date:  09/09/2015 Time:  8:57 PM  Group Topic/Focus:  Wrap-Up Group:   The focus of this group is to help patients review their daily goal of treatment and discuss progress on daily workbooks.  Participation Level:  Active  Participation Quality:  Appropriate  Affect:  Appropriate  Cognitive:  Appropriate  Insight: Appropriate  Engagement in Group:  Engaged  Modes of Intervention:  Discussion  Additional Comments:Patient expressed that she attended group.The patient also said that she rates her day a 8 which is a good day.    Nash Shearer 09/09/2015, 8:57 PM

## 2015-09-09 NOTE — Progress Notes (Signed)
DAR NOTE: Patient in her room most of this shift.  Mood and affect remain depressed.  Reports left hip pain.  lidocaine patch applied with fair effect.  Denies auditory and visual hallucinations.  Rates depression at 7, hopelessness at 5, and anxiety at 5.  Maintained on routine safety checks.  Medications given as prescribed.  Support and encouragement offered as needed.  Attended group and participated.  States goal for today is "getting motivated, stable medication, and relaxing my mind."  Patient encouraged to get up for meals and group therapy but refused.

## 2015-09-10 NOTE — Progress Notes (Signed)
DAR NOTE: Patient preoccupied with getting discharge.  Patient continues to states "I don't belong here, I keep to myself, I don't like to be around people."  Patient encouraged to stay up and out of her room.  Mood and affect remain depressed.  Denies auditory and visual hallucinations.  Maintained on routine safety checks.  Medications given as prescribed.  Support and encouragement offered as needed.  Attended group and participated.  States goal for today is "getting out of here." Patient states she will call her mum to pick her up without discharge orders.  Discharge orders and process explained to patient and patient verbalizes understanding of the process.

## 2015-09-10 NOTE — B[facility] Group Notes (Signed)
[facility] Group Notes:  (Clinical Social Work)  09/10/2015  [facility] Group Notes:  (Clinical Social Work)  09/10/2015  11:00AM-12:00PM  Summary of Progress/Problems:  The main focus of today's process group was to listen to a variety of genres of music and to identify that different types of music provoke different responses.  The patient then was able to identify personally what was soothing for them, as well as energizing.  Handouts were used to record feelings evoked, as well as how patient can personally use this knowledge in sleep habits, with depression, and with other symptoms.  The patient expressed understanding of concepts, as well as knowledge of how each type of music affected her and how this can be used at home as a wellness/recovery tool.  She seemed to enjoy this activity, and was very able to discuss her feelings.  Type of Therapy:  Music Therapy   Participation Level:  Active  Participation Quality:  Attentive and Sharing  Affect:  Blunted  Cognitive:  Oriented  Insight:  Engaged  Engagement in Therapy:  Engaged  Modes of Intervention:   Activity, Exploration  Selmer Dominion, LCSW 09/10/2015

## 2015-09-10 NOTE — [facility] Group Notes (Signed)
[facility] Group Notes:  (Nursing/MHT/Case Management/Adjunct)  Date:  09/10/2015  Time:  11:45 AM  Type of Therapy:  Nurse Education  Participation Level:  Active  Participation Quality:  Appropriate and Attentive  Affect:  Appropriate  Cognitive:  Alert and Appropriate  Insight:  Appropriate and Good  Engagement in Group:  Engaged and Improving  Modes of Intervention:  Activity, Discussion, Education, Exploration and Support  Summary of Progress/Problems: Group topic today was Health Support Systems. Discussed healthy and not healthy support people. Discussed making a list of healthy support people and utilize during times of stress. Patient states goal for today is "getting out of here."   Mart Piggs 09/10/2015, 11:45 AM

## 2015-09-10 NOTE — Progress Notes (Signed)
[facility] MD Progress Note  09/10/2015 1:49 PM Alicia Blake  MRN:  [id number] Subjective: Patient states " I feel less depressed, I am  still in pain.'    Objective:Alicia Blake is a 49 year old AAF , who is single , unemployed , lives by self in [place] , has a hx of several different diagnosis of MDD, bipolar , schizoaffective do as well as schizophrenia ( per patient report) , presented to the ED with worsening depression as well as SI with plan to cut self or OD on pills.   Patient seen and chart reviewed.Discussed patient with treatment team. Pt today seen in bed, continues to appears withdrawn , isolative. Pt reports her depression is better than on admission, she has been tolerating her medications well. Pt however per nursing stays to self , withdrawn mostly , minimal participation in groups and milieu. Pt continues to be focussed on her pain medications- discussed misuse/abuse hx, discussed making use of alternative pain management.         Principal Problem: Schizoaffective disorder, bipolar type ([place]) Diagnosis:   Patient Active Problem List   Diagnosis Date Noted  . Opioid use disorder, mild, abuse [F11.10] 09/08/2015  . Hypokalemia [E87.6] 09/07/2015  . Schizoaffective disorder, bipolar type ([employer]) [F25.0] 09/07/2015  . Chronic pain syndrome [G89.4] 09/07/2015  . Alcohol use disorder, moderate, in sustained remission ([place]) [F10.21] 09/07/2015  . Cocaine use disorder, moderate, in sustained remission [F14.90] 09/07/2015  . Cannabis use disorder, moderate, in sustained remission [F12.90] 09/07/2015  . DDD (degenerative disc disease), lumbar [M51.36] 09/07/2015  . Hx of fracture of ankle [Z87.81] 09/07/2015  . Symptomatic Fibroids [D25.9] 08/09/2013  . Breast lump on left side at 9 o'clock position [N63] 06/29/2013  . Breast discharge [N64.52] 06/29/2013   Total Time spent with patient: 25 minutes  Past Psychiatric History: Pt was diagnosed with depression , Bipolar , schizophrenia  as well as PTSD in the past ( per pt report). Pt reports several hospitalizations - [facility] , [place] , [place] , [place] . Pt reports she had an ACTT who helped with her housing. Pt reports hx of suicide attempt x1 in the past by OD. Pt reports hx of being on Lithium. Pt follows up with family services and also gets therapy from [facility] in [place].   Past Medical History:  Past Medical History  Diagnosis Date  . Hypertension   . Asthma   . Bronchitis   . GERD (gastroesophageal reflux disease)   . Thyroid disease   . PTSD (post-traumatic stress disorder)   . Bipolar affect, depressed ([place])   . Chronic back pain     Past Surgical History  Procedure Laterality Date  . Neck surgery    . Abdominal surgery     Family History:  Family History  Problem Relation Age of Onset  . Hypertension Mother   . Hypertension Maternal Grandmother   . Hypertension Paternal Grandmother   . Diabetes Paternal Grandmother   . Hypertension Paternal Grandfather   . Diabetes Paternal Grandfather   . Alcohol abuse Paternal Aunt   . Mental illness Cousin    Family Psychiatric  History: Pt reports cousin has mental illness and that paternal aunt has a hx of alcohol abuse Social History: Pt currently lives in an apartment in [place] , her mother lives with her to help her out. Pt has applied for SSD. Pt has a son who is 74 years old , lives in [place]. Pt is currently unemployed ,  denies legal issues. History  Alcohol Use  . Yes    Comment: quit date 01/17/2013     History  Drug Use  . Yes  . Special: "Crack" cocaine    Comment: quit date 01/17/2013    Social History   Social History  . Marital Status: Single    Spouse Name: N/A  . Number of Children: N/A  . Years of Education: N/A   Social History Main Topics  . Smoking status: Current Every Day Smoker -- 0.25 packs/day    Types: Cigarettes  . Smokeless tobacco: None  . Alcohol Use: Yes     Comment: quit date 01/17/2013   . Drug Use: Yes    Special: "Crack" cocaine     Comment: quit date 01/17/2013  . Sexual Activity: Yes    Birth Control/ Protection: None   Other Topics Concern  . None   Social History Narrative   Additional Social History:    History of alcohol / drug use?: Yes                    Sleep: Fair  Appetite:  Poor  Current Medications: Current Facility-Administered Medications  Medication Dose Route Frequency Provider Last Rate Last Dose  . albuterol (PROVENTIL HFA;VENTOLIN HFA) 108 (90 BASE) MCG/ACT inhaler 2 puff  2 puff Inhalation Q6H PRN [place], NP   2 puff at 09/08/15 2341  . alum & mag hydroxide-simeth (MAALOX/MYLANTA) 200-200-20 MG/5ML suspension 30 mL  30 mL Oral Q4H PRN Ursula Alert, MD   30 mL at 09/09/15 1118  . divalproex (DEPAKOTE) DR tablet 500 mg  500 mg Oral Q12H Ursula Alert, MD   500 mg at 09/10/15 0804  . doxycycline (VIBRA-TABS) tablet 100 mg  100 mg Oral Q12H Markeeta Scalf, MD   100 mg at 09/10/15 0804  . hydrOXYzine (ATARAX/VISTARIL) tablet 25 mg  25 mg Oral Q6H PRN Laverle Hobby, PA-C   25 mg at 09/09/15 2123  . ibuprofen (ADVIL,MOTRIN) tablet 600 mg  600 mg Oral Q6H PRN Ursula Alert, MD   600 mg at 09/09/15 2123  . lidocaine (LIDODERM) 5 % 1 patch  1 patch Transdermal Q24H Ursula Alert, MD   1 patch at 09/10/15 1253  . lisinopril (PRINIVIL,ZESTRIL) tablet 10 mg  10 mg Oral Daily Derrill Center, NP   10 mg at 09/10/15 0804  . magnesium hydroxide (MILK OF MAGNESIA) suspension 30 mL  30 mL Oral Daily PRN Ursula Alert, MD      . nicotine (NICODERM CQ - dosed in mg/24 hours) patch 14 mg  14 mg Transdermal Daily Ursula Alert, MD   14 mg at 09/10/15 0806  . OLANZapine zydis (ZYPREXA) disintegrating tablet 5 mg  5 mg Oral TID PRN Ursula Alert, MD       Or  . OLANZapine (ZYPREXA) injection 5 mg  5 mg Intramuscular TID PRN Ursula Alert, MD      . oxyCODONE (Oxy IR/ROXICODONE) immediate release tablet 5 mg  5 mg Oral Daily PRN Ursula Alert, MD   5 mg at 09/08/15 1816  . QUEtiapine (SEROQUEL) tablet 300 mg  300 mg Oral QHS Ursula Alert, MD   300 mg at 09/09/15 2123    Lab Results:  Results for orders placed or performed during the hospital encounter of 09/06/15 (from the past 48 hour(s))  Lipid panel     Status: None   Collection Time: 09/09/15  6:40 AM  Result Value Ref Range  Cholesterol 145 0 - 200 mg/dL   Triglycerides 64 <150 mg/dL   HDL 48 >40 mg/dL   Total CHOL/HDL Ratio 3.0 RATIO   VLDL 13 0 - 40 mg/dL   LDL Cholesterol 84 0 - 99 mg/dL    Comment:        Total Cholesterol/HDL:CHD Risk Coronary Heart Disease Risk Table                     Men   Women  1/2 Average Risk   3.4   3.3  Average Risk       5.0   4.4  2 X Average Risk   9.6   7.1  3 X Average Risk  23.4   11.0        Use the calculated Patient Ratio above and the CHD Risk Table to determine the patient's CHD Risk.        ATP III CLASSIFICATION (LDL):  <100     mg/dL   Optimal  100-129  mg/dL   Near or Above                    Optimal  130-159  mg/dL   Borderline  160-189  mg/dL   High  >190     mg/dL   Very High Performed at [facility]   TSH     Status: None   Collection Time: 09/09/15  6:40 AM  Result Value Ref Range   TSH 2.293 0.350 - 4.500 uIU/mL    Comment: Performed at [facility] metabolic panel     Status: Abnormal   Collection Time: 09/09/15  6:30 PM  Result Value Ref Range   Sodium 138 135 - 145 mmol/L   Potassium 3.7 3.5 - 5.1 mmol/L   Chloride 100 (L) 101 - 111 mmol/L   CO2 29 22 - 32 mmol/L   Glucose, Bld 107 (H) 65 - 99 mg/dL   BUN 12 6 - 20 mg/dL   Creatinine, Ser 0.96 0.44 - 1.00 mg/dL   Calcium 9.4 8.9 - 10.3 mg/dL   GFR calc non Af Amer >60 >60 mL/min   GFR calc Af Amer >60 >60 mL/min    Comment: (NOTE) The eGFR has been calculated using the CKD EPI equation. This calculation has not been validated in all clinical situations. eGFR's persistently <60 mL/min signify  possible Chronic Kidney Disease.    Anion gap 9 5 - 15    Comment: Performed at [facility]    Physical Findings: AIMS: Facial and Oral Movements Muscles of Facial Expression: None, normal Lips and Perioral Area: None, normal Jaw: None, normal Tongue: None, normal,Extremity Movements Upper (arms, wrists, hands, fingers): None, normal Lower (legs, knees, ankles, toes): None, normal, Trunk Movements Neck, shoulders, hips: None, normal, Overall Severity Severity of abnormal movements (highest score from questions above): None, normal Incapacitation due to abnormal movements: None, normal Patient's awareness of abnormal movements (rate only patient's report): No Awareness, Dental Status Current problems with teeth and/or dentures?: No Does patient usually wear dentures?: No  CIWA:  CIWA-Ar Total: 0 COWS:     Musculoskeletal: Strength & Muscle Tone: within normal limits Gait & Station: normal Patient leans: N/A  Psychiatric Specialty Exam: Review of Systems  Musculoskeletal: Positive for back pain.  Psychiatric/Behavioral: Positive for depression, hallucinations (IMPROVING) and substance abuse. The patient is nervous/anxious.   All other systems reviewed and are negative.   Blood pressure 149/87,  pulse 98, temperature 98.7 F (37.1 C), temperature source Oral, resp. rate 16, height 5' 3.5" (1.613 m), weight 112.946 kg (249 lb), last menstrual period 07/14/2013.Body mass index is 43.41 kg/(m^2).  General Appearance: Fairly Groomed  Engineer, water::  Fair  Speech:  Slow  Volume:  Decreased  Mood:  Anxious and Depressed improving  Affect:  Depressed  Thought Process:  Coherent  Orientation:  Full (Time, Place, and Person)  Thought Content:  Hallucinations: Auditory and Rumination improving  Suicidal Thoughts:  No  Homicidal Thoughts:  No  Memory:  Immediate;   Fair Recent;   Fair Remote;   Fair  Judgement:  Impaired  Insight:  Lacking  Psychomotor  Activity:  Decreased  Concentration:  Poor  Recall:  [employer] of Knowledge:Fair  Language: Fair  Akathisia:  No  Handed:  Right  AIMS (if indicated):     Assets:  Communication Skills  ADL's:  Intact  Cognition: WNL  Sleep:  Number of Hours: 6.5   Treatment Plan Summary:Shareta is a 49 year old AAF  With schizoaffective do , chronic pain, opioid use disorder, presented with worsening depression as well as psychosis. Pt continues to be withdrawn , isolative. Pt will continue to need inpatient stay.  Daily contact with patient to assess and evaluate symptoms and progress in treatment and Medication management  Will continue Depakote DR 500 mg po bid for mood sx. Depakote level on 09/11/15. Will continue Seroquel 300 mg po qhs for sleep, psychosis. Will continue vistaril 25 mg po tid prn for anxiety sx. Will also provide Zyprexa 5 mg po /IM tid prn for severe anxiety/agitation. Will continue pain management - Lidocaine patch as well as motrin 600 mg prn. Continue  Oxycodone 5 mg daily prn for severe pain - called [facility] [place] , [place] - she was being prescribe the same By Dr.Mark Kathryne Hitch . Will continue to monitor vitals ,medication compliance and treatment side effects while patient is here.  Will monitor for medical issues as well as call consult as needed.  Reviewed labs K+ low - replaced with Kdur 10 meq x 2 doses , Repeat BMP - wnl . CSW will start working on disposition.  Patient to participate in therapeutic milieu .    , MD 09/10/2015, 1:49 PM

## 2015-09-11 LAB — VALPROIC ACID LEVEL: Valproic Acid Lvl: 75 ug/mL (ref 50.0–100.0)

## 2015-09-11 LAB — HEMOGLOBIN A1C
HEMOGLOBIN A1C: 6 % — AB (ref 4.8–5.6)
MEAN PLASMA GLUCOSE: 126 mg/dL

## 2015-09-11 LAB — PROLACTIN: Prolactin: 12.7 ng/mL (ref 4.8–23.3)

## 2015-09-11 NOTE — Progress Notes (Signed)
Adult Psychoeducational Group Note  Date:  09/11/2015 Time:  12:12 AM  Group Topic/Focus:  Wrap-Up Group:   The focus of this group is to help patients review their daily goal of treatment and discuss progress on daily workbooks.  Participation Level:  Active  Participation Quality:  Appropriate  Affect:  Appropriate  Cognitive:  Appropriate  Insight: Appropriate and Good  Engagement in Group:  Engaged  Modes of Intervention:  Education  Additional Comments:  Pt started off having a rough day but later it started coming together. Pt mentioned the groups today was beneficial to her and she learned coping skills.  Jerline Pain 09/11/2015, 12:12 AM

## 2015-09-11 NOTE — Progress Notes (Signed)
[facility] MD Progress Note  09/11/2015  Alicia Blake  MRN:  [id number] Subjective: Patient states "I feel like I'm doing so much better today. I feel like I've been better and Dr. Shea Evans was saying I might be able to go home on Tuesday."  Objective: Alicia Blake is a 49 year old AAF , who is single , unemployed , lives by self in [place] , has a hx of several different diagnosis of MDD, bipolar , schizoaffective do as well as schizophrenia ( per patient report) , presented to the ED with worsening depression as well as SI with plan to cut self or OD on pills.   Pt seen and chart reviewed. Pt is alert/oriented x4, calm, cooperative and appropriate. Pt reports that she is ready to discharge and that she hopes she can leave on Tuesday. Pt denies suicidal/homicidal ideation and psychosis and does not appear to be responding to internal stimuli. However, she is ruminative about her discharge plans. This may be transient and focused on that topic and we will continue to monitor her for other ruminative traits while considering her discharge.    Principal Problem: Schizoaffective disorder, bipolar type ([place]) Diagnosis:   Patient Active Problem List   Diagnosis Date Noted  . Opioid use disorder, mild, abuse [F11.10] 09/08/2015  . Hypokalemia [E87.6] 09/07/2015  . Schizoaffective disorder, bipolar type ([place]) [F25.0] 09/07/2015  . Chronic pain syndrome [G89.4] 09/07/2015  . Alcohol use disorder, moderate, in sustained remission ([place]) [F10.21] 09/07/2015  . Cocaine use disorder, moderate, in sustained remission [F14.90] 09/07/2015  . Cannabis use disorder, moderate, in sustained remission [F12.90] 09/07/2015  . DDD (degenerative disc disease), lumbar [M51.36] 09/07/2015  . Hx of fracture of ankle [Z87.81] 09/07/2015  . Symptomatic Fibroids [D25.9] 08/09/2013  . Breast lump on left side at 9 o'clock position [N63] 06/29/2013  . Breast discharge [N64.52] 06/29/2013   Total Time spent with patient: 15 minutes  Past  Psychiatric History: Pt was diagnosed with depression , Bipolar , schizophrenia as well as PTSD in the past ( per pt report). Pt reports several hospitalizations - [facility] , [place] , [place] , [place] . Pt reports she had an ACTT who helped with her housing. Pt reports hx of suicide attempt x1 in the past by OD. Pt reports hx of being on Lithium. Pt follows up with family services and also gets therapy from [facility] in [place].   Past Medical History:  Past Medical History  Diagnosis Date  . Hypertension   . Asthma   . Bronchitis   . GERD (gastroesophageal reflux disease)   . Thyroid disease   . PTSD (post-traumatic stress disorder)   . Bipolar affect, depressed ([place])   . Chronic back pain     Past Surgical History  Procedure Laterality Date  . Neck surgery    . Abdominal surgery     Family History:  Family History  Problem Relation Age of Onset  . Hypertension Mother   . Hypertension Maternal Grandmother   . Hypertension Paternal Grandmother   . Diabetes Paternal Grandmother   . Hypertension Paternal Grandfather   . Diabetes Paternal Grandfather   . Alcohol abuse Paternal Aunt   . Mental illness Cousin    Family Psychiatric  History: Pt reports cousin has mental illness and that paternal aunt has a hx of alcohol abuse  Social History: Pt currently lives in an apartment in [place] , her mother lives with her to help her out. Pt has applied for  SSD. Pt has a son who is 33 years old , lives in [place]. Pt is currently unemployed , denies legal issues.   History  Alcohol Use  . Yes    Comment: quit date 01/17/2013     History  Drug Use  . Yes  . Special: "Crack" cocaine    Comment: quit date 01/17/2013    Social History   Social History  . Marital Status: Single    Spouse Name: N/A  . Number of Children: N/A  . Years of Education: N/A   Social History Main Topics  . Smoking status: Current Every Day Smoker -- 0.25 packs/day    Types:  Cigarettes  . Smokeless tobacco: None  . Alcohol Use: Yes     Comment: quit date 01/17/2013  . Drug Use: Yes    Special: "Crack" cocaine     Comment: quit date 01/17/2013  . Sexual Activity: Yes    Birth Control/ Protection: None   Other Topics Concern  . None   Social History Narrative   Additional Social History:    History of alcohol / drug use?: Yes                    Sleep: Good  Appetite:  Good   Current Medications: Current Facility-Administered Medications  Medication Dose Route Frequency Provider Last Rate Last Dose  . albuterol (PROVENTIL HFA;VENTOLIN HFA) 108 (90 BASE) MCG/ACT inhaler 2 puff  2 puff Inhalation Q6H PRN Derrill Center, NP   2 puff at 09/08/15 2341  . alum & mag hydroxide-simeth (MAALOX/MYLANTA) 200-200-20 MG/5ML suspension 30 mL  30 mL Oral Q4H PRN Ursula Alert, MD   30 mL at 09/10/15 1946  . divalproex (DEPAKOTE) DR tablet 500 mg  500 mg Oral Q12H Ursula Alert, MD   500 mg at 09/11/15 0808  . doxycycline (VIBRA-TABS) tablet 100 mg  100 mg Oral Q12H Saramma Eappen, MD   100 mg at 09/11/15 0809  . hydrOXYzine (ATARAX/VISTARIL) tablet 25 mg  25 mg Oral Q6H PRN Laverle Hobby, PA-C   25 mg at 09/09/15 2123  . ibuprofen (ADVIL,MOTRIN) tablet 600 mg  600 mg Oral Q6H PRN Ursula Alert, MD   600 mg at 09/10/15 1948  . lidocaine (LIDODERM) 5 % 1 patch  1 patch Transdermal Q24H Ursula Alert, MD   1 patch at 09/11/15 1105  . lisinopril (PRINIVIL,ZESTRIL) tablet 10 mg  10 mg Oral Daily Derrill Center, NP   10 mg at 09/11/15 0488  . magnesium hydroxide (MILK OF MAGNESIA) suspension 30 mL  30 mL Oral Daily PRN Ursula Alert, MD      . nicotine (NICODERM CQ - dosed in mg/24 hours) patch 14 mg  14 mg Transdermal Daily Ursula Alert, MD   14 mg at 09/11/15 0808  . OLANZapine zydis (ZYPREXA) disintegrating tablet 5 mg  5 mg Oral TID PRN Ursula Alert, MD       Or  . OLANZapine (ZYPREXA) injection 5 mg  5 mg Intramuscular TID PRN Ursula Alert, MD       . oxyCODONE (Oxy IR/ROXICODONE) immediate release tablet 5 mg  5 mg Oral Daily PRN Ursula Alert, MD   5 mg at 09/11/15 0807  . QUEtiapine (SEROQUEL) tablet 300 mg  300 mg Oral QHS Ursula Alert, MD   300 mg at 09/10/15 2127    Lab Results:  Results for orders placed or performed during the hospital encounter of 09/06/15 (from the past 48 hour(s))  Basic metabolic panel     Status: Abnormal   Collection Time: 09/09/15  6:30 PM  Result Value Ref Range   Sodium 138 135 - 145 mmol/L   Potassium 3.7 3.5 - 5.1 mmol/L   Chloride 100 (L) 101 - 111 mmol/L   CO2 29 22 - 32 mmol/L   Glucose, Bld 107 (H) 65 - 99 mg/dL   BUN 12 6 - 20 mg/dL   Creatinine, Ser 0.96 0.44 - 1.00 mg/dL   Calcium 9.4 8.9 - 10.3 mg/dL   GFR calc non Af Amer >60 >60 mL/min   GFR calc Af Amer >60 >60 mL/min    Comment: (NOTE) The eGFR has been calculated using the CKD EPI equation. This calculation has not been validated in all clinical situations. eGFR's persistently <60 mL/min signify possible Chronic Kidney Disease.    Anion gap 9 5 - 15    Comment: Performed at [facility]  Valproic acid level     Status: None   Collection Time: 09/11/15  6:20 AM  Result Value Ref Range   Valproic Acid Lvl 75 50.0 - 100.0 ug/mL    Comment: Performed at [facility]    Physical Findings: AIMS: Facial and Oral Movements Muscles of Facial Expression: None, normal Lips and Perioral Area: None, normal Jaw: None, normal Tongue: None, normal,Extremity Movements Upper (arms, wrists, hands, fingers): None, normal Lower (legs, knees, ankles, toes): None, normal, Trunk Movements Neck, shoulders, hips: None, normal, Overall Severity Severity of abnormal movements (highest score from questions above): None, normal Incapacitation due to abnormal movements: None, normal Patient's awareness of abnormal movements (rate only patient's report): No Awareness, Dental Status Current problems with  teeth and/or dentures?: No Does patient usually wear dentures?: No  CIWA:  CIWA-Ar Total: 0 COWS:     Musculoskeletal: Strength & Muscle Tone: within normal limits Gait & Station: normal Patient leans: N/A  Psychiatric Specialty Exam: Review of Systems  Musculoskeletal: Positive for back pain.  Psychiatric/Behavioral: Positive for depression, hallucinations (IMPROVING) and substance abuse. The patient is nervous/anxious.   All other systems reviewed and are negative.   Blood pressure 136/81, pulse 101, temperature 98.6 F (37 C), temperature source Oral, resp. rate 16, height 5' 3.5" (1.613 m), weight 112.946 kg (249 lb), last menstrual period 07/14/2013.Body mass index is 43.41 kg/(m^2).  General Appearance: Fairly Groomed  Engineer, water::  Fair  Speech:  Slow  Volume:  Decreased  Mood:  Anxious and Depressed improving  Affect:  Depressed  Thought Process:  Coherent  Orientation:  Full (Time, Place, and Person)  Thought Content:  Hallucinations: Auditory and Rumination improving and denies today  Suicidal Thoughts:  No  Homicidal Thoughts:  No  Memory:  Immediate;   Fair Recent;   Fair Remote;   Fair  Judgement:  Impaired  Insight:  Lacking  Psychomotor Activity:  Decreased  Concentration:  Poor  Recall:  [employer] of Knowledge:Fair  Language: Fair  Akathisia:  No  Handed:  Right  AIMS (if indicated):     Assets:  Communication Skills  ADL's:  Intact  Cognition: WNL  Sleep:  Number of Hours: 6.75   Treatment Plan Summary:Alicia Blake is a 49 year old AAF  With schizoaffective do , chronic pain, opioid use disorder, presented with worsening depression as well as psychosis. Pt is improving and interacting with others. May consider discharge in the next few days.   Daily contact with patient to assess and evaluate symptoms and progress in  treatment and Medication management  On 09/11/15, I have reviewed and concur with treatment plan below, modified as follows:   Will  continue Depakote DR 500 mg po bid for mood sx. Depakote level on 09/11/15. Will continue Seroquel 300 mg po qhs for sleep, psychosis. Will continue vistaril 25 mg po tid prn for anxiety sx. Will also provide Zyprexa 5 mg po /IM tid prn for severe anxiety/agitation. Will continue pain management - Lidocaine patch as well as motrin 600 mg prn. Continue  Oxycodone 5 mg daily prn for severe pain - called [facility] at [place] , [place] - she was being prescribe the same By Dr.Mark Kathryne Hitch . Will continue to monitor vitals ,medication compliance and treatment side effects while patient is here.  Will monitor for medical issues as well as call consult as needed.  Reviewed labs K+ low - replaced with Kdur 10 meq x 2 doses , Repeat BMP - wnl . CSW will start working on disposition.  Patient to participate in therapeutic milieu .    Benjamine Mola, FNP-BC 09/11/2015, 10:30AM Agree with NP Progress Note, as above Neita Garnet , MD

## 2015-09-11 NOTE — Progress Notes (Signed)
D: Pt is alert and oriented x4. Pt denies depression, anxiety, SI, HI and AVH. Pt however, is flat and sad looking. She states, "I just want to go home, am fine." Pt complained of a headache and later moderate hip pain 6 on a 0-10 pain scale. Pt may be med seeking. Pt remained calm and cooperative through the assessment.  A: Pt was encouraged to attend group. Medications offered meds as prescribed.  Support, encouragement, and safe environment provided.  15-minute safety checks continue.  R: Pt was med compliant.  Attended group. Safety checks continue.

## 2015-09-11 NOTE — Progress Notes (Signed)
D: Pt presents anxious in affect and neutral in mood. Pt denies any SI/HI/AVH. Pt is visible and active within milieu. Pt reports readiness for discharge. Pt reports that she is tolerating her medications well. Pt observed interacting with others appropriately.  A: Writer administered scheduled and prn medications to pt, per MD orders. Continued support and availability as needed was extended to this pt. Staff continues to monitor pt with q55min checks.  R: No adverse drug reactions noted. Pt receptive to treatment. Pt remains safe at this time.

## 2015-09-11 NOTE — [facility] Group Notes (Signed)
[facility] Group Notes:  (Counselor/Nursing/MHT/Case Management/Adjunct)  09/11/2015 1:15PM  Type of Therapy:  Group Therapy  Participation Level:  Active  Participation Quality:  Appropriate  Affect:  Flat  Cognitive:  Oriented  Insight:  Improving  Engagement in Group:  Limited  Engagement in Therapy:  Limited  Modes of Intervention:  Discussion, Exploration and Socialization  Summary of Progress/Problems: The topic for group was balance in life.  Pt participated in the discussion about when their life was in balance and out of balance and how this feels.  Pt discussed ways to get back in balance and short term goals they can work on to get where they want to be. Stayed the entire time.  Attentive throughout.  Bright affect.  Marked difference from Friday when she exhibited flat affect, depressed mood, helpless and hopeless. Today is focused on d/c ASAP.  Talked about the importance of her meds to help her stabilize as she feels they are working well now.  Needed clarification that she should not be taking meds that she was prescribed prior to admission.  She also talked about taking a scented bath for help with relaxation and balance.   Roque Lias B 09/11/2015 2:41 PM

## 2015-09-11 NOTE — Progress Notes (Signed)
Nursing Note: 0700-1900  D:  Mood is , affect is.  Self Inventory: pt rates: depression, hopelessness and anxiety at 0/10. "I really want to go home, I am getting stressed just thinking about my bills and where I need to move to."  A:  Encouraged to verbalize needs and concerns, active listening and support provided.  Continued Q 15 minute safety checks.  Observed active participation in group settings.  R:  Pt. denies A/V hallucinations and is able to verbally contract for safety.

## 2015-09-11 NOTE — Progress Notes (Signed)
Adult Psychoeducational Group Note  Date:  09/11/2015 Time:  8:58 PM  Group Topic/Focus:  Wrap-Up Group:   The focus of this group is to help patients review their daily goal of treatment and discuss progress on daily workbooks.  Participation Level:  Active  Participation Quality:  Appropriate  Affect:  Appropriate  Cognitive:  Appropriate  Insight: Appropriate  Engagement in Group:  Engaged  Modes of Intervention:  Discussion  Additional Comments: The patient expressed that she attended group.The patient also said that coping skill and music therapy were good groups.  Nash Shearer 09/11/2015, 8:58 PM

## 2015-09-12 MED ORDER — DIVALPROEX SODIUM 500 MG PO DR TAB: 500.0000 mg | DELAYED_RELEASE_TABLET | Freq: Two times a day (BID) | ORAL | Status: DC

## 2015-09-12 MED ORDER — QUETIAPINE FUMARATE 300 MG PO TABS: 300.0000 mg | ORAL_TABLET | Freq: Every day | ORAL | Status: DC

## 2015-09-12 MED ORDER — LIDOCAINE 5 % EX PTCH: 1.0000 | MEDICATED_PATCH | CUTANEOUS | Status: DC

## 2015-09-12 MED ORDER — DOXYCYCLINE HYCLATE 100 MG PO TABS: 100.0000 mg | ORAL_TABLET | Freq: Two times a day (BID) | ORAL | Status: DC

## 2015-09-12 MED ORDER — OXYCODONE HCL 5 MG PO TABS: 5.0000 mg | ORAL_TABLET | Freq: Every day | ORAL | Status: DC | PRN

## 2015-09-12 MED ORDER — HYDROXYZINE HCL 25 MG PO TABS: 25.0000 mg | ORAL_TABLET | Freq: Four times a day (QID) | ORAL | Status: DC | PRN

## 2015-09-12 MED ORDER — OMEPRAZOLE 20 MG PO CPDR: 20.0000 mg | DELAYED_RELEASE_CAPSULE | Freq: Every day | ORAL | Status: DC

## 2015-09-12 MED ORDER — NICOTINE 14 MG/24HR TD PT24: 14.0000 mg | MEDICATED_PATCH | Freq: Every day | TRANSDERMAL | Status: DC

## 2015-09-12 MED ORDER — LISINOPRIL 10 MG PO TABS: 10.0000 mg | ORAL_TABLET | Freq: Every day | ORAL | Status: DC
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
## 2015-09-12 NOTE — Progress Notes (Signed)
  [facility] Adult Case Management Discharge Plan :  Will you be returning to the same living situation after discharge:  Yes,  home At discharge, do you have transportation home?: Yes,  family Do you have the ability to pay for your medications: Yes,  mental health/mother  Release of information consent forms completed and in the chart;  Patient's signature needed at discharge.  Patient to Follow up at: Follow-up Information    Follow up with [facility] . Go on 10/16/2015.   Why:  Monday at 4:00 with Dr Georgiana Shore information:   [place] [phone]      Follow up with [facility] On 09/13/2015.   Why:  Wednesday at 1:00 with your therapist.   Contact information:   [address]  [place] [phone]      Next level of care provider has access to [place]:  unknown Patient denies SI/HI: Yes,  yes    Safety Planning and Suicide Prevention discussed: Yes,  yes  Have you used any form of tobacco in the last 30 days? (Cigarettes, Smokeless Tobacco, Cigars, and/or Pipes): Yes  Has patient been referred to the Quitline?: Yes, faxed on 09/12/15  Trish Mage 09/12/2015, 8:57 AM

## 2015-09-12 NOTE — Tx Team (Signed)
Interdisciplinary Treatment Plan Update (Adult)  Date:  09/12/2015 Time Reviewed:  8:57 AM  Progress in Treatment: Attending groups: Yes. Participating in groups: Yes. Taking medication as prescribed:  Yes. Tolerating medication:  Yes. Family/Significant othe contact made: Yes Patient understands diagnosis:  Yes, as evidenced by seeking help with AVH, SI, and HI. Discussing patient identified problems/goals with staff:  Yes, see initial care plan. Medical problems stabilized or resolved:  Yes Denies suicidal/homicidal ideation: Yes. Issues/concerns per patient self-inventory: No. Other:  New problem(s) identified:   Discharge Plan or Barriers: See below  Reason for Continuation of Hospitalization:   Comments: Alicia Blake is an 49 y.o. female who voluntarily presents to [facility] with active SI w/ a plan to slit her wrists. Pt shared that she has been abusing Crack, THC, Heroin, ETOH, & pills for "all my life" and has now been clean for 18 months. She reported that it's an everyday struggle to continue to stay clean. Pt reports that she goes to [facility]. Pt shared that she has been feeling like she "just don't want to live no more" for about 3 months and the feeling has gotten "stronger" within the last month. Pt has therapy and psychiatric services at [facility], but admits to being non compliant in therapy and in going to her NA meeting regularly. Pt indicates that her hometown is [place], [place] and she still has medical doctors in [place] that she sees frequently. Pt doesn't have a license, so her mother takes her back and forth, but when then travel to [place] for the appts, they will end up staying there "2 and 3 weeks". Pt states that this is the reason why she has been non compliant. Pt believes that her SI could be resolved by some "heavier medications", as she thinks the ones she's taking is not working.  Pt denies HI, but endorses AVH, including command, telling her  to start using again or to OD.  Pt is oriented x 4. Her mood was apathetic and her affect was flat. Depakote, Seroquel trial    Estimated length of stay: D/C today  New goal(s):  Review of initial/current patient goals per problem list:  1. Goal(s): Patient will participate in aftercare plan  Met:Yes   Target date: at discharge  As evidenced by: Patient will participate within aftercare plan AEB aftercare provider and housing plan at discharge being identified.  09/07/15: Pt will return home and follow-up outpt with [facility] and [facility].  2. Goal (s): Patient will exhibit decreased depressive symptoms and suicidal ideations.  Met: Yes   Target date: at discharge  As evidenced by: Patient will utilize self rating of depression at 3 or below and demonstrate decreased signs of depression or be deemed stable for discharge by MD.  09/07/15: Pt endorses SI and presents with a flat affect. Will continue to assess. 09/12/15:  Pt denies depression today  4. Goal(s): Patient will demonstrate decreased signs of psychosis.  Met: Yes  Target date:at discharge  As evidenced by: Patient will demonstrate decreased signs of psychosis as evidenced by a reduction in AVH, paranoia, and/or delusions.   09/07/15: Pt endorses AVH and paranoia.  09/12/15:  No signs nor symptoms of psychosis today.   Attendees: Patient:  09/12/2015 8:57 AM  Family:   09/12/2015 8:57 AM  Physician:  Dr. Ursula Alert, MD 09/12/2015 8:57 AM  Nursing:   Hedy Jacob, RN 09/12/2015 8:57 AM  Case Manager:  Roque Lias, LCSW 09/12/2015 8:57  AM  Counselor:  Matthew Saras, MSW Intern 09/12/2015 8:57 AM  Other:   09/12/2015 8:57 AM  Other:   09/12/2015 8:57 AM  Other:   09/12/2015 8:57 AM  Other:  09/12/2015 8:57 AM  Other:    Other:    Other:    Other:    Other:    Other:      Scribe for Treatment Team:   Georga Kaufmann, MSW Intern 09/12/2015 8:57 AM

## 2015-09-12 NOTE — [facility] Suicide Risk Assessment (Signed)
[facility] Discharge Suicide Risk Assessment   Demographic Factors:  Unemployed  Total Time spent with patient: 30 minutes  Musculoskeletal: Strength & Muscle Tone: within normal limits Gait & Station: normal Patient leans: N/A  Psychiatric Specialty Exam: Physical Exam  Review of Systems  Psychiatric/Behavioral: Negative for depression and suicidal ideas. The patient is not nervous/anxious and does not have insomnia.     Blood pressure 122/75, pulse 107, temperature 98 F (36.7 C), temperature source Oral, resp. rate 16, height 5' 3.5" (1.613 m), weight 112.946 kg (249 lb), last menstrual period 07/14/2013.Body mass index is 43.41 kg/(m^2).  General Appearance: Casual  Eye Contact::  Fair  Speech:  Clear and [id number]  Volume:  Normal  Mood:  Euthymic  Affect:  Appropriate  Thought Process:  Coherent  Orientation:  Full (Time, Place, and Person)  Thought Content:  WDL  Suicidal Thoughts:  No  Homicidal Thoughts:  No  Memory:  Immediate;   Fair Recent;   Fair Remote;   Fair  Judgement:  Fair  Insight:  Fair  Psychomotor Activity:  Normal  Concentration:  Fair  Recall:  [employer] of Knowledge:Fair  Language: Fair  Akathisia:  No  Handed:  Right  AIMS (if indicated):     Assets:  Communication Skills Desire for Improvement  Sleep:  Number of Hours: 6.25  Cognition: WNL  ADL's:  Intact   Have you used any form of tobacco in the last 30 days? (Cigarettes, Smokeless Tobacco, Cigars, and/or Pipes): Yes  Has this patient used any form of tobacco in the last 30 days? (Cigarettes, Smokeless Tobacco, Cigars, and/or Pipes) Yes, Prescription provided for nicotine patch.  Mental Status Per Nursing Assessment::   On Admission:  Suicidal ideation indicated by patient  Current Mental Status by Physician: Pt denies SI/HI/AH/VH  Loss Factors: Decrease in vocational status  Historical Factors: Impulsivity  Risk Reduction Factors:   Living with another person, especially a  relative and Positive social support  Continued Clinical Symptoms:  Previous Psychiatric Diagnoses and Treatments Medical Diagnoses and Treatments/Surgeries  Cognitive Features That Contribute To Risk:  None    Suicide Risk:  Minimal: No identifiable suicidal ideation.  Patients presenting with no risk factors but with morbid ruminations; may be classified as minimal risk based on the severity of the depressive symptoms  Principal Problem: Schizoaffective disorder, bipolar type [facility]) Discharge Diagnoses:  Patient Active Problem List   Diagnosis Date Noted  . Opioid use disorder, mild, abuse [F11.10] 09/08/2015  . Hypokalemia [E87.6] 09/07/2015  . Schizoaffective disorder, bipolar type ([place]) [F25.0] 09/07/2015  . Chronic pain syndrome [G89.4] 09/07/2015  . Alcohol use disorder, moderate, in sustained remission ([place]) [F10.21] 09/07/2015  . Cocaine use disorder, moderate, in sustained remission [F14.90] 09/07/2015  . Cannabis use disorder, moderate, in sustained remission [F12.90] 09/07/2015  . DDD (degenerative disc disease), lumbar [M51.36] 09/07/2015  . Hx of fracture of ankle [Z87.81] 09/07/2015  . Symptomatic Fibroids [D25.9] 08/09/2013  . Breast lump on left side at 9 o'clock position [N63] 06/29/2013  . Breast discharge [N64.52] 06/29/2013    Follow-up Information    Follow up with [facility] . Go on 10/16/2015.   Why:  Monday at 4:00 with Dr Georgiana Shore information:   [place] [phone]      Follow up with [place] On 09/13/2015.   Why:  Wednesday at 1:00 with your therapist.   Contact information:   [address]  [place] [  [facility]/Follow-up recommendations:  Activity:  No restrictions Diet:  regular Tests:  none Other:  follow up with after care  Is patient on multiple antipsychotic therapies at discharge:  No   Has Patient had three or more failed trials of antipsychotic  monotherapy by history:  No  Recommended Plan for Multiple Antipsychotic Therapies: NA    Tayleigh Wetherell MD 09/12/2015, 9:41 AM

## 2015-09-12 NOTE — Progress Notes (Signed)
Pt discharged home with her mother. Pt was stable, ambulatory and appreciative at that time. All papers and prescriptions were given and valuables returned. Verbal understanding expressed. Denies SI/HI and A/VH. Pt given opportunity to express concerns and ask questions.

## 2015-10-02 ENCOUNTER — Ambulatory Visit
Admission: RE | Admit: 2015-10-02 | Discharge: 2015-10-02 | Disposition: A | Discharge status: Home / Self Care | Payer: Self-pay | Source: Ambulatory Visit

## 2015-10-02 DIAGNOSIS — Z1231 Encounter for screening mammogram for malignant neoplasm of breast: Secondary | ICD-10-CM

## 2015-10-18 ENCOUNTER — Emergency Department (HOSPITAL_COMMUNITY)
Admission: EM | Admit: 2015-10-18 | Discharge: 2015-10-18 | Disposition: A | Discharge status: Home / Self Care | Payer: Medicaid Other | Attending: Emergency Medicine | Admitting: Emergency Medicine

## 2015-10-18 ENCOUNTER — Emergency Department (HOSPITAL_COMMUNITY): Payer: Medicaid Other

## 2015-10-18 ENCOUNTER — Encounter (HOSPITAL_COMMUNITY): Payer: Self-pay | Admitting: Emergency Medicine

## 2015-10-18 DIAGNOSIS — M545 Low back pain: Secondary | ICD-10-CM | POA: Diagnosis not present

## 2015-10-18 DIAGNOSIS — K219 Gastro-esophageal reflux disease without esophagitis: Secondary | ICD-10-CM | POA: Insufficient documentation

## 2015-10-18 DIAGNOSIS — Z79899 Other long term (current) drug therapy: Secondary | ICD-10-CM | POA: Insufficient documentation

## 2015-10-18 DIAGNOSIS — F17210 Nicotine dependence, cigarettes, uncomplicated: Secondary | ICD-10-CM | POA: Insufficient documentation

## 2015-10-18 DIAGNOSIS — F4310 Post-traumatic stress disorder, unspecified: Secondary | ICD-10-CM | POA: Diagnosis not present

## 2015-10-18 DIAGNOSIS — I10 Essential (primary) hypertension: Secondary | ICD-10-CM | POA: Diagnosis not present

## 2015-10-18 DIAGNOSIS — G8929 Other chronic pain: Secondary | ICD-10-CM | POA: Diagnosis not present

## 2015-10-18 DIAGNOSIS — Z7951 Long term (current) use of inhaled steroids: Secondary | ICD-10-CM | POA: Diagnosis not present

## 2015-10-18 DIAGNOSIS — Z8639 Personal history of other endocrine, nutritional and metabolic disease: Secondary | ICD-10-CM | POA: Diagnosis not present

## 2015-10-18 DIAGNOSIS — J45909 Unspecified asthma, uncomplicated: Secondary | ICD-10-CM | POA: Diagnosis not present

## 2015-10-18 DIAGNOSIS — Z7982 Long term (current) use of aspirin: Secondary | ICD-10-CM | POA: Diagnosis not present

## 2015-10-18 DIAGNOSIS — F319 Bipolar disorder, unspecified: Secondary | ICD-10-CM | POA: Insufficient documentation

## 2015-10-18 DIAGNOSIS — R079 Chest pain, unspecified: Secondary | ICD-10-CM | POA: Diagnosis present

## 2015-10-18 LAB — URINALYSIS, ROUTINE W REFLEX MICROSCOPIC
Bilirubin Urine: NEGATIVE
Glucose, UA: NEGATIVE mg/dL
Hgb urine dipstick: NEGATIVE
KETONES UR: NEGATIVE mg/dL
Leukocytes, UA: NEGATIVE
Nitrite: NEGATIVE
PH: 6.5 (ref 5.0–8.0)
PROTEIN: NEGATIVE mg/dL
SPECIFIC GRAVITY, URINE: 1.03 (ref 1.005–1.030)

## 2015-10-18 LAB — CBC
HCT: 35.9 % — ABNORMAL LOW (ref 36.0–46.0)
Hemoglobin: 11.8 g/dL — ABNORMAL LOW (ref 12.0–15.0)
MCH: 30.1 pg (ref 26.0–34.0)
MCHC: 32.9 g/dL (ref 30.0–36.0)
MCV: 91.6 fL (ref 78.0–100.0)
PLATELETS: 195 10*3/uL (ref 150–400)
RBC: 3.92 MIL/uL (ref 3.87–5.11)
RDW: 15 % (ref 11.5–15.5)
WBC: 6.6 10*3/uL (ref 4.0–10.5)

## 2015-10-18 LAB — BASIC METABOLIC PANEL
Anion gap: 9 (ref 5–15)
BUN: 15 mg/dL (ref 6–20)
CHLORIDE: 104 mmol/L (ref 101–111)
CO2: 30 mmol/L (ref 22–32)
CREATININE: 1 mg/dL (ref 0.44–1.00)
Calcium: 9.4 mg/dL (ref 8.9–10.3)
GFR calc Af Amer: >60 mL/min (ref >60)
GFR calc non Af Amer: >60 mL/min (ref >60)
GLUCOSE: 107 mg/dL — AB (ref 65–99)
Potassium: 4.3 mmol/L (ref 3.5–5.1)
SODIUM: 143 mmol/L (ref 135–145)

## 2015-10-18 LAB — I-STAT TROPONIN, ED: Troponin i, poc: 0 ng/mL (ref 0.00–0.08)

## 2015-10-18 MED ORDER — PANTOPRAZOLE SODIUM 20 MG PO TBEC: 20.0000 mg | DELAYED_RELEASE_TABLET | Freq: Every day | ORAL | Status: DC

## 2015-10-18 MED ORDER — RANITIDINE HCL 150 MG PO CAPS: 150.0000 mg | ORAL_CAPSULE | Freq: Two times a day (BID) | ORAL | Status: DC

## 2015-10-18 MED ORDER — PANTOPRAZOLE SODIUM 40 MG PO TBEC
40.0000 mg | DELAYED_RELEASE_TABLET | Freq: Once | ORAL | Status: AC
  Administered 2015-10-18: 40 mg via ORAL
  Filled 2015-10-18: qty 1

## 2015-10-18 MED ORDER — DEXAMETHASONE SODIUM PHOSPHATE 10 MG/ML IJ SOLN
10.0000 mg | Freq: Once | INTRAMUSCULAR | Status: AC
  Administered 2015-10-18: 10 mg via INTRAMUSCULAR
  Filled 2015-10-18: qty 1

## 2015-10-18 MED ORDER — GI COCKTAIL ~~LOC~~
30.0000 mL | Freq: Once | ORAL | Status: AC
  Administered 2015-10-18: 30 mL via ORAL
  Filled 2015-10-18: qty 30

## 2015-10-18 MED ORDER — KETOROLAC TROMETHAMINE 60 MG/2ML IM SOLN
60.0000 mg | Freq: Once | INTRAMUSCULAR | Status: AC
  Administered 2015-10-18: 60 mg via INTRAMUSCULAR
  Filled 2015-10-18: qty 2

## 2015-10-18 MED ORDER — IBUPROFEN 800 MG PO TABS: 800.0000 mg | ORAL_TABLET | Freq: Three times a day (TID) | ORAL | Status: DC

## 2015-10-18 NOTE — ED Provider Notes (Signed)
CSN: [id number]     Arrival date & time 10/18/15  1538 History   First MD Initiated Contact with Patient 10/18/15 1702     Chief Complaint  Patient presents with  . Chest Pain  . Back Pain     (Consider location/radiation/quality/duration/timing/severity/associated sxs/prior Treatment) HPI   Alicia Blake is a 49 y.o. female with PMH significant for HTN, asthma, bronchitis, GERD, PTSD, Bipolar disorder, and chronic back pain who presents with constant, moderate, worsening, non-radiating, burning, substernal CP that began yesterday evening after she ate foods containing spicy cheese.  She reports she is lactose intolerant.  She states eating makes it worse.  She took "acid reflux pills", but shortly experienced an episode of emesis.  Associated symptoms include N/V, diaphoresis, dizziness, constipation, cough, wheezing, and SOB.  Denies numbness, tingling, bowel/bladder incontinence, or abdominal pain.  She is also complaining of her chronic low back pain.  She currently smokes about a pack a day.  Denies any new injury or fall.  She takes hydrocodone and oxycodone for her chronic back pain.   Past Medical History  Diagnosis Date  . Hypertension   . Asthma   . Bronchitis   . GERD (gastroesophageal reflux disease)   . Thyroid disease   . PTSD (post-traumatic stress disorder)   . Bipolar affect, depressed ([place])   . Chronic back pain    Past Surgical History  Procedure Laterality Date  . Neck surgery    . Abdominal surgery     Family History  Problem Relation Age of Onset  . Hypertension Mother   . Hypertension Maternal Grandmother   . Hypertension Paternal Grandmother   . Diabetes Paternal Grandmother   . Hypertension Paternal Grandfather   . Diabetes Paternal Grandfather   . Alcohol abuse Paternal Aunt   . Mental illness Cousin    Social History  Substance Use Topics  . Smoking status: Current Every Day Smoker -- 0.25 packs/day    Types: Cigarettes  . Smokeless tobacco:  None  . Alcohol Use: Yes     Comment: quit date 01/17/2013   OB History    Gravida Para Term Preterm AB TAB SAB Ectopic Multiple Living   1 1 1       1      Review of Systems  All other systems negative unless otherwise stated in HPI   Allergies  Crab; Lithium; and Other  Home Medications   Prior to Admission medications   Medication Sig Start Date End Date Taking? Authorizing Provider  albuterol (PROVENTIL HFA;VENTOLIN HFA) 108 (90 BASE) MCG/ACT inhaler Inhale 2 puffs into the lungs every 6 (six) hours as needed for wheezing.   Yes Historical Provider, MD  albuterol (PROVENTIL) (2.5 MG/3ML) 0.083% nebulizer solution Take 2.5 mg by nebulization every 6 (six) hours as needed for wheezing or shortness of breath.   Yes Historical Provider, MD  aspirin EC 81 MG tablet Take 81 mg by mouth daily.   Yes Historical Provider, MD  divalproex (DEPAKOTE) 500 MG DR tablet Take 1 tablet (500 mg total) by mouth every 12 (twelve) hours. 09/12/15  Yes Benjamine Mola, FNP  Fluticasone-Salmeterol (ADVAIR) 250-50 MCG/DOSE AEPB Inhale 1 puff into the lungs daily.   Yes Historical Provider, MD  furosemide (LASIX) 20 MG tablet Take 20 mg by mouth daily.   Yes Historical Provider, MD  hydrOXYzine (ATARAX/VISTARIL) 25 MG tablet Take 1 tablet (25 mg total) by mouth every 6 (six) hours as needed for anxiety. 09/12/15  Yes Elyse Jarvis  [place], FNP  lisinopril (PRINIVIL,ZESTRIL) 10 MG tablet Take 1 tablet (10 mg total) by mouth daily. 09/12/15  Yes Benjamine Mola, FNP  QUEtiapine (SEROQUEL) 300 MG tablet Take 1 tablet (300 mg total) by mouth at bedtime. 09/12/15  Yes Benjamine Mola, FNP  ibuprofen (ADVIL,MOTRIN) 800 MG tablet Take 1 tablet (800 mg total) by mouth 3 (three) times daily. 10/18/15   Gloriann Loan, PA-C  lidocaine (LIDODERM) 5 % Place 1 patch onto the skin daily. Remove & Discard patch within 12 hours or as directed by MD Patient not taking: Reported on 10/18/2015 09/12/15   Benjamine Mola, FNP  nicotine  (NICODERM CQ - DOSED IN MG/24 HOURS) 14 mg/24hr patch Place 1 patch (14 mg total) onto the skin daily. Patient not taking: Reported on 10/18/2015 09/12/15   Benjamine Mola, FNP  omeprazole (PRILOSEC) 20 MG capsule Take 1 capsule (20 mg total) by mouth daily. Patient not taking: Reported on 10/18/2015 09/12/15   Benjamine Mola, FNP  oxyCODONE (OXY IR/ROXICODONE) 5 MG immediate release tablet Take 1 tablet (5 mg total) by mouth daily as needed for severe pain. Patient not taking: Reported on 10/18/2015 09/12/15   Benjamine Mola, FNP  pantoprazole (PROTONIX) 20 MG tablet Take 1 tablet (20 mg total) by mouth daily. 10/18/15   Gloriann Loan, PA-C  ranitidine (ZANTAC) 150 MG capsule Take 1 capsule (150 mg total) by mouth 2 (two) times daily. 10/18/15   Maddox Hlavaty, PA-C   BP 124/82 mmHg  Pulse 98  Temp(Src) 98 F (36.7 C)  Resp 18  SpO2 99%  LMP 07/14/2013 Physical Exam  Constitutional: She is oriented to person, place, and time. She appears well-developed and well-nourished.  HENT:  Head: Normocephalic and atraumatic.  Mouth/Throat: Oropharynx is clear and moist.  Eyes: Conjunctivae are normal. Pupils are equal, round, and reactive to light.  Neck: Normal range of motion. Neck supple.  Cardiovascular: Normal rate, regular rhythm, normal heart sounds and intact distal pulses.   No murmur heard. Pulses:      Dorsalis pedis pulses are 2+ on the right side, and 2+ on the left side.  Pulmonary/Chest: Effort normal. No accessory muscle usage or stridor. No respiratory distress. She has wheezes. She has no rhonchi. She has no rales. She exhibits no tenderness.  Abdominal: Soft. Bowel sounds are normal. She exhibits no distension. There is no tenderness. There is no rebound and no guarding.  Musculoskeletal: Normal range of motion. She exhibits tenderness.       Lumbar back: She exhibits tenderness and pain. She exhibits normal range of motion, no bony tenderness, no swelling, no laceration and no  spasm.       Back:  Lymphadenopathy:    She has no cervical adenopathy.  Neurological: She is alert and oriented to person, place, and time.  Speech clear without dysarthria.  Strength and sensation intact bilaterally throughout lower extremities.  No saddle anesthesia.  Antalgic gait favoring left side.  Skin: Skin is warm and dry.  Psychiatric: She has a normal mood and affect. Her behavior is normal.    ED Course  Procedures (including critical care time) Labs Review Labs Reviewed  BASIC METABOLIC PANEL - Abnormal; Notable for the following:    Glucose, Bld 107 (*)    All other components within normal limits  CBC - Abnormal; Notable for the following:    Hemoglobin 11.8 (*)    HCT 35.9 (*)    All other components within normal limits  URINALYSIS, ROUTINE W REFLEX MICROSCOPIC (NOT AT [facility])  Randolm Idol, ED    Imaging Review Dg Chest 2 View  10/18/2015  CLINICAL DATA:  Acute chest pain EXAM: CHEST  2 VIEW COMPARISON:  02/07/2015 FINDINGS: The heart size and mediastinal contours are within normal limits. Both lungs are clear. The visualized skeletal structures are unremarkable. IMPRESSION: No active cardiopulmonary disease. Electronically Signed   By: Jerilynn Mages.  Shick M.D.   On: 10/18/2015 16:33   I have personally reviewed and evaluated these images and lab results as part of my medical decision-making.   EKG Interpretation   Date/Time:  Wednesday October 18 2015 15:51:42 EST Ventricular Rate:  97 PR Interval:  124 QRS Duration: 83 QT Interval:  330 QTC Calculation: 419 R Axis:   91 Text Interpretation:  Sinus rhythm Borderline right axis deviation Low  voltage, precordial leads No significant change was found Confirmed by  CAMPOS  MD, Lennette Bihari ([id number]) on 10/18/2015 6:29:00 PM      MDM   Final diagnoses:  Gastroesophageal reflux disease, esophagitis presence not specified  Right low back pain, with sciatica presence unspecified    Patient presents with chronic  low back pain and substernal burning chest pain after eating spicy cheese yesterday evening.  Assoc sxs include N/V.  VSS, NAD.  No back pain red flags.  On exam, heart RRR, lungs with wheezing (she has hx of asthma and bronchitis), abdomen soft and benign.  TTP of left lower back musculature.  No midline tenderness.  No saddle anesthesia.  No focal neurological deficits.  Will obtain labs, EKG, CXR.  Will give GI cocktail and protonix.  Labs unremarkable.  CXR negative.  Upon reassessment, patient's symptoms have improved.  Doubt cardiac etiology.  Doubt PNA.  Doubt asthma exacerbation.  High suspicion for GERD.  Will d/c home with protonix, ranitidine, and aleve. Evaluation does not show pathology requring ongoing emergent intervention or admission. Pt is hemodynamically stable and mentating appropriately. Discussed findings/results and plan with patient/guardian, who agrees with plan. All questions answered. Return precautions discussed and outpatient follow up given.  Case has been discussed with Dr. Venora Maples who agrees with the above plan for discharge.      Gloriann Loan, PA-C 10/18/15 1844  Jola Schmidt, MD 10/18/15 [phone]

## 2015-10-18 NOTE — ED Notes (Signed)
Patient presents c/o sternal CP starting yesterday evening, also states she had three episodes of emesis and was diaphoretic and dizzy. Patient also c/o chronic lower right back pain, with a history of degenerative disc disease.

## 2015-10-18 NOTE — Discharge Instructions (Signed)
Chronic Back Pain  When back pain lasts longer than 3 months, it is called chronic back pain.People with chronic back pain often go through certain periods that are more intense (flare-ups).  CAUSES Chronic back pain can be caused by wear and tear (degeneration) on different structures in your back. These structures include:  The bones of your spine (vertebrae) and the joints surrounding your spinal cord and nerve roots (facets).  The strong, fibrous tissues that connect your vertebrae (ligaments). Degeneration of these structures may result in pressure on your nerves. This can lead to constant pain. HOME CARE INSTRUCTIONS  Avoid bending, heavy lifting, prolonged sitting, and activities which make the problem worse.  Take brief periods of rest throughout the day to reduce your pain. Lying down or standing usually is better than sitting while you are resting.  Take over-the-counter or prescription medicines only as directed by your caregiver. SEEK IMMEDIATE MEDICAL CARE IF:   You have weakness or numbness in one of your legs or feet.  You have trouble controlling your bladder or bowels.  You have nausea, vomiting, abdominal pain, shortness of breath, or fainting.   This information is not intended to replace advice given to you by your health care provider. Make sure you discuss any questions you have with your health care provider.   Document Released: 11/21/2004 Document Revised: 01/06/2012 Document Reviewed: 04/03/2015 Elsevier Interactive Patient Education 2016 [place].  Gastroesophageal Reflux Disease, Adult Normally, food travels down the esophagus and stays in the stomach to be digested. However, when a person has gastroesophageal reflux disease (GERD), food and stomach acid move back up into the esophagus. When this happens, the esophagus becomes sore and inflamed. Over time, GERD can create small holes (ulcers) in the lining of the esophagus.  CAUSES This condition is  caused by a problem with the muscle between the esophagus and the stomach (lower esophageal sphincter, or LES). Normally, the LES muscle closes after food passes through the esophagus to the stomach. When the LES is weakened or abnormal, it does not close properly, and that allows food and stomach acid to go back up into the esophagus. The LES can be weakened by certain dietary substances, medicines, and medical conditions, including:  Tobacco use.  Pregnancy.  Having a hiatal hernia.  Heavy alcohol use.  Certain foods and beverages, such as coffee, chocolate, onions, and peppermint. RISK FACTORS This condition is more likely to develop in:  People who have an increased body weight.  People who have connective tissue disorders.  People who use NSAID medicines. SYMPTOMS Symptoms of this condition include:  Heartburn.  Difficult or painful swallowing.  The feeling of having a lump in the throat.  Abitter taste in the mouth.  Bad breath.  Having a large amount of saliva.  Having an upset or bloated stomach.  Belching.  Chest pain.  Shortness of breath or wheezing.  Ongoing (chronic) cough or a night-time cough.  Wearing away of tooth enamel.  Weight loss. Different conditions can cause chest pain. Make sure to see your health care provider if you experience chest pain. DIAGNOSIS Your health care provider will take a medical history and perform a physical exam. To determine if you have mild or severe GERD, your health care provider may also monitor how you respond to treatment. You may also have other tests, including:  An endoscopy toexamine your stomach and esophagus with a small camera.  A test thatmeasures the acidity level in your esophagus.  A test  thatmeasures how much pressure is on your esophagus.  A barium swallow or modified barium swallow to show the shape, size, and functioning of your esophagus. TREATMENT The goal of treatment is to help  relieve your symptoms and to prevent complications. Treatment for this condition may vary depending on how severe your symptoms are. Your health care provider may recommend:  Changes to your diet.  Medicine.  Surgery. HOME CARE INSTRUCTIONS Diet  Follow a diet as recommended by your health care provider. This may involve avoiding foods and drinks such as:  Coffee and tea (with or without caffeine).  Drinks that containalcohol.  Energy drinks and sports drinks.  Carbonated drinks or sodas.  Chocolate and cocoa.  Peppermint and mint flavorings.  Garlic and onions.  Horseradish.  Spicy and acidic foods, including peppers, chili powder, curry powder, vinegar, hot sauces, and barbecue sauce.  Citrus fruit juices and citrus fruits, such as oranges, lemons, and limes.  Tomato-based foods, such as red sauce, chili, salsa, and pizza with red sauce.  Fried and fatty foods, such as donuts, french fries, potato chips, and high-fat dressings.  High-fat meats, such as hot dogs and fatty cuts of red and white meats, such as rib eye steak, sausage, ham, and bacon.  High-fat dairy items, such as whole milk, butter, and cream cheese.  Eat small, frequent meals instead of large meals.  Avoid drinking large amounts of liquid with your meals.  Avoid eating meals during the 2-3 hours before bedtime.  Avoid lying down right after you eat.  Do not exercise right after you eat. General Instructions  Pay attention to any changes in your symptoms.  Take over-the-counter and prescription medicines only as told by your health care provider. Do not take aspirin, ibuprofen, or other NSAIDs unless your health care provider told you to do so.  Do not use any tobacco products, including cigarettes, chewing tobacco, and e-cigarettes. If you need help quitting, ask your health care provider.  Wear loose-fitting clothing. Do not wear anything tight around your waist that causes pressure on  your abdomen.  Raise (elevate) the head of your bed 6 inches (15cm).  Try to reduce your stress, such as with yoga or meditation. If you need help reducing stress, ask your health care provider.  If you are overweight, reduce your weight to an amount that is healthy for you. Ask your health care provider for guidance about a safe weight loss goal.  Keep all follow-up visits as told by your health care provider. This is important. SEEK MEDICAL CARE IF:  You have new symptoms.  You have unexplained weight loss.  You have difficulty swallowing, or it hurts to swallow.  You have wheezing or a persistent cough.  Your symptoms do not improve with treatment.  You have a hoarse voice. SEEK IMMEDIATE MEDICAL CARE IF:  You have pain in your arms, neck, jaw, teeth, or back.  You feel sweaty, dizzy, or light-headed.  You have chest pain or shortness of breath.  You vomit and your vomit looks like blood or coffee grounds.  You faint.  Your stool is bloody or black.  You cannot swallow, drink, or eat.   This information is not intended to replace advice given to you by your health care provider. Make sure you discuss any questions you have with your health care provider.   Document Released: 07/24/2005 Document Revised: 07/05/2015 Document Reviewed: 02/08/2015 Elsevier Interactive Patient Education 2016 [place] for Gastroesophageal Reflux Disease,  Adult When you have gastroesophageal reflux disease (GERD), the foods you eat and your eating habits are very important. Choosing the right foods can help ease your discomfort.  WHAT GUIDELINES DO I NEED TO FOLLOW?   Choose fruits, vegetables, whole grains, and low-fat dairy products.   Choose low-fat meat, fish, and poultry.  Limit fats such as oils, salad dressings, butter, nuts, and avocado.   Keep a food diary. This helps you identify foods that cause symptoms.   Avoid foods that cause symptoms. These may  be different for everyone.   Eat small meals often instead of 3 large meals a day.   Eat your meals slowly, in a place where you are relaxed.   Limit fried foods.   Cook foods using methods other than frying.   Avoid drinking alcohol.   Avoid drinking large amounts of liquids with your meals.   Avoid bending over or lying down until 2-3 hours after eating.  WHAT FOODS ARE NOT RECOMMENDED?  These are some foods and drinks that may make your symptoms worse: Vegetables Tomatoes. Tomato juice. Tomato and spaghetti sauce. Chili peppers. Onion and garlic. Horseradish. Fruits Oranges, grapefruit, and lemon (fruit and juice). Meats High-fat meats, fish, and poultry. This includes hot dogs, ribs, ham, sausage, salami, and bacon. Dairy Whole milk and chocolate milk. Sour cream. Cream. Butter. Ice cream. Cream cheese.  Drinks Coffee and tea. Bubbly (carbonated) drinks or energy drinks. Condiments Hot sauce. Barbecue sauce.  Sweets/Desserts Chocolate and cocoa. Donuts. Peppermint and spearmint. Fats and Oils High-fat foods. This includes [place] fries and potato chips. Other Vinegar. Strong spices. This includes black pepper, white pepper, red pepper, cayenne, curry powder, cloves, ginger, and chili powder. The items listed above may not be a complete list of foods and drinks to avoid. Contact your dietitian for more information.   This information is not intended to replace advice given to you by your health care provider. Make sure you discuss any questions you have with your health care provider.   Document Released: 04/14/2012 Document Revised: 11/04/2014 Document Reviewed: 08/18/2013 Elsevier Interactive Patient Education [employer].

## 2015-10-27 ENCOUNTER — Encounter (HOSPITAL_COMMUNITY): Payer: Self-pay | Admitting: Emergency Medicine

## 2015-10-27 ENCOUNTER — Emergency Department (HOSPITAL_COMMUNITY)
Admission: EM | Admit: 2015-10-27 | Discharge: 2015-10-27 | Disposition: A | Discharge status: Home / Self Care | Payer: Medicaid Other | Attending: Emergency Medicine | Admitting: Emergency Medicine

## 2015-10-27 DIAGNOSIS — K5900 Constipation, unspecified: Secondary | ICD-10-CM | POA: Diagnosis not present

## 2015-10-27 DIAGNOSIS — F319 Bipolar disorder, unspecified: Secondary | ICD-10-CM | POA: Diagnosis not present

## 2015-10-27 DIAGNOSIS — Z7951 Long term (current) use of inhaled steroids: Secondary | ICD-10-CM | POA: Diagnosis not present

## 2015-10-27 DIAGNOSIS — F17210 Nicotine dependence, cigarettes, uncomplicated: Secondary | ICD-10-CM | POA: Insufficient documentation

## 2015-10-27 DIAGNOSIS — Z791 Long term (current) use of non-steroidal anti-inflammatories (NSAID): Secondary | ICD-10-CM | POA: Insufficient documentation

## 2015-10-27 DIAGNOSIS — I10 Essential (primary) hypertension: Secondary | ICD-10-CM | POA: Diagnosis not present

## 2015-10-27 DIAGNOSIS — J45909 Unspecified asthma, uncomplicated: Secondary | ICD-10-CM | POA: Insufficient documentation

## 2015-10-27 DIAGNOSIS — Z7982 Long term (current) use of aspirin: Secondary | ICD-10-CM | POA: Diagnosis not present

## 2015-10-27 DIAGNOSIS — F4310 Post-traumatic stress disorder, unspecified: Secondary | ICD-10-CM | POA: Diagnosis not present

## 2015-10-27 DIAGNOSIS — Z8639 Personal history of other endocrine, nutritional and metabolic disease: Secondary | ICD-10-CM | POA: Diagnosis not present

## 2015-10-27 DIAGNOSIS — G8929 Other chronic pain: Secondary | ICD-10-CM | POA: Insufficient documentation

## 2015-10-27 DIAGNOSIS — Z79899 Other long term (current) drug therapy: Secondary | ICD-10-CM | POA: Insufficient documentation

## 2015-10-27 DIAGNOSIS — M549 Dorsalgia, unspecified: Secondary | ICD-10-CM

## 2015-10-27 DIAGNOSIS — M25552 Pain in left hip: Secondary | ICD-10-CM | POA: Diagnosis present

## 2015-10-27 DIAGNOSIS — R350 Frequency of micturition: Secondary | ICD-10-CM | POA: Diagnosis not present

## 2015-10-27 DIAGNOSIS — M545 Low back pain: Secondary | ICD-10-CM | POA: Insufficient documentation

## 2015-10-27 DIAGNOSIS — K219 Gastro-esophageal reflux disease without esophagitis: Secondary | ICD-10-CM | POA: Insufficient documentation

## 2015-10-27 MED ORDER — KETOROLAC TROMETHAMINE 60 MG/2ML IM SOLN
60.0000 mg | Freq: Once | INTRAMUSCULAR | Status: AC
  Administered 2015-10-27: 60 mg via INTRAMUSCULAR
  Filled 2015-10-27: qty 2

## 2015-10-27 MED ORDER — OXYCODONE-ACETAMINOPHEN 5-325 MG PO TABS
1.0000 | ORAL_TABLET | Freq: Once | ORAL | Status: AC
  Administered 2015-10-27: 1 via ORAL
  Filled 2015-10-27: qty 1

## 2015-10-27 MED ORDER — METHOCARBAMOL 500 MG PO TABS
500.0000 mg | ORAL_TABLET | Freq: Once | ORAL | Status: AC
  Administered 2015-10-27: 500 mg via ORAL
  Filled 2015-10-27: qty 1

## 2015-10-27 MED ORDER — METHOCARBAMOL 500 MG PO TABS: 500.0000 mg | ORAL_TABLET | Freq: Two times a day (BID) | ORAL | Status: DC

## 2015-10-27 NOTE — ED Provider Notes (Signed)
CSN: [id number]     Arrival date & time 10/27/15  1551 History  By signing my name below, I, Alicia Blake, attest that this documentation has been prepared under the direction and in the presence of [name], PA-C.  Electronically Signed: [name], ED Scribe. 10/27/2015. 5:27 PM.   Chief Complaint  Patient presents with  . Hip Pain   The history is provided by the patient. No language interpreter was used.     HPI Comments: Alicia Blake is a 49 y.o. female with hx chronic back pain who presents to the Emergency Department complaining of gradual onset, constant, worsening, left lower back and left hip pain x 1 month. Pt is able to ambulate but reports pain with ambulation. No known recent injury, trauma, or fall that could have exacerbated the pain. She mentions being seen at [facility] for her pain. Pt has been getting cortisone injections every 3 months and mentions needing to make an appointment in January (next month) to receive another injection. Pt states that she could not see her physician today because they were not open. Per care everywhere, provider does not want to place pt on narcotic medication and suggested adding Neurontin. Pt states that she does not want to take the Neurontin because it has not helped her in the past. She also complains about urinary frequency and constipation. Denies fever, chills, weakness or numbness in extremities, new urinary incontinence, bowel incontinence, or any other associated symptoms.   Past Medical History  Diagnosis Date  . Hypertension   . Asthma   . Bronchitis   . GERD (gastroesophageal reflux disease)   . Thyroid disease   . PTSD (post-traumatic stress disorder)   . Bipolar affect, depressed ([place])   . Chronic back pain    Past Surgical History  Procedure Laterality Date  . Neck surgery    . Abdominal surgery     Family History  Problem Relation Age of Onset  . Hypertension Mother   . Hypertension Maternal  Grandmother   . Hypertension Paternal Grandmother   . Diabetes Paternal Grandmother   . Hypertension Paternal Grandfather   . Diabetes Paternal Grandfather   . Alcohol abuse Paternal Aunt   . Mental illness Cousin    Social History  Substance Use Topics  . Smoking status: Current Every Day Smoker -- 0.25 packs/day    Types: Cigarettes  . Smokeless tobacco: None  . Alcohol Use: Yes     Comment: quit date 01/17/2013   OB History    Gravida Para Term Preterm AB TAB SAB Ectopic Multiple Living   1 1 1       1      Review of Systems  Constitutional: Negative for fever and chills.  Gastrointestinal:       Negative for bowel incontinence  Genitourinary:       Negative for urinary incontinence  Musculoskeletal: Positive for back pain and arthralgias (Left hip pain).  Neurological: Negative for weakness and numbness.   Allergies  Crab; Lithium; and Other  Home Medications   Prior to Admission medications   Medication Sig Start Date End Date Taking? Authorizing Provider  albuterol (PROVENTIL HFA;VENTOLIN HFA) 108 (90 BASE) MCG/ACT inhaler Inhale 2 puffs into the lungs every 6 (six) hours as needed for wheezing.   Yes Historical Provider, MD  aspirin EC 81 MG tablet Take 81 mg by mouth daily.   Yes Historical Provider, MD  divalproex (DEPAKOTE) 500 MG DR tablet Take 1 tablet (500 mg  total) by mouth every 12 (twelve) hours. 09/12/15  Yes Benjamine Mola, FNP  hydrOXYzine (ATARAX/VISTARIL) 25 MG tablet Take 1 tablet (25 mg total) by mouth every 6 (six) hours as needed for anxiety. 09/12/15  Yes Benjamine Mola, FNP  ibuprofen (ADVIL,MOTRIN) 800 MG tablet Take 1 tablet (800 mg total) by mouth 3 (three) times daily. 10/18/15  Yes Kayla Rose, PA-C  lisinopril (PRINIVIL,ZESTRIL) 10 MG tablet Take 1 tablet (10 mg total) by mouth daily. 09/12/15  Yes Benjamine Mola, FNP  tiotropium (SPIRIVA) 18 MCG inhalation capsule Place 18 mcg into inhaler and inhale daily.   Yes Historical Provider, MD   albuterol (PROVENTIL) (2.5 MG/3ML) 0.083% nebulizer solution Take 2.5 mg by nebulization every 6 (six) hours as needed for wheezing or shortness of breath.    Historical Provider, MD  Fluticasone-Salmeterol (ADVAIR) 250-50 MCG/DOSE AEPB Inhale 1 puff into the lungs daily.    Historical Provider, MD  furosemide (LASIX) 20 MG tablet Take 20 mg by mouth daily.    Historical Provider, MD  lidocaine (LIDODERM) 5 % Place 1 patch onto the skin daily. Remove & Discard patch within 12 hours or as directed by MD Patient not taking: Reported on 10/18/2015 09/12/15   Benjamine Mola, FNP  nicotine (NICODERM CQ - DOSED IN MG/24 HOURS) 14 mg/24hr patch Place 1 patch (14 mg total) onto the skin daily. Patient not taking: Reported on 10/18/2015 09/12/15   Benjamine Mola, FNP  omeprazole (PRILOSEC) 20 MG capsule Take 1 capsule (20 mg total) by mouth daily. Patient not taking: Reported on 10/18/2015 09/12/15   Benjamine Mola, FNP  oxyCODONE (OXY IR/ROXICODONE) 5 MG immediate release tablet Take 1 tablet (5 mg total) by mouth daily as needed for severe pain. Patient not taking: Reported on 10/18/2015 09/12/15   Benjamine Mola, FNP  pantoprazole (PROTONIX) 20 MG tablet Take 1 tablet (20 mg total) by mouth daily. 10/18/15   Gloriann Loan, PA-C  QUEtiapine (SEROQUEL) 300 MG tablet Take 1 tablet (300 mg total) by mouth at bedtime. 09/12/15   Benjamine Mola, FNP  ranitidine (ZANTAC) 150 MG capsule Take 1 capsule (150 mg total) by mouth 2 (two) times daily. 10/18/15   Gloriann Loan, PA-C   Triage Vitals: BP 115/76 mmHg  Pulse 102  Temp(Src) 97.6 F (36.4 C) (Oral)  Resp 18  SpO2 100%  LMP 07/14/2013   Physical Exam  Constitutional: She is oriented to person, place, and time. She appears well-developed and well-nourished. No distress.  HENT:  Head: Normocephalic and atraumatic.  Eyes: Conjunctivae and EOM are normal.  Neck: Neck supple. No tracheal deviation present.  Cardiovascular: Normal rate.   Pulmonary/Chest:  Effort normal. No respiratory distress.  Musculoskeletal: Normal range of motion.  Left si joint and perilumbar muscle tenderness. Pain with left straight leg raise.   Neurological: She is alert and oriented to person, place, and time.  5/5 and equal lower extremity strength. 2+ and equal patellar reflexes bilaterally. Pt able to dorsiflex bilateral toes and feet with good strength against resistance. Equal sensation bilaterally over thighs and lower legs.   Skin: Skin is warm and dry.  Psychiatric: She has a normal mood and affect. Her behavior is normal.  Nursing note and vitals reviewed.   ED Course  Procedures (including critical care time)  DIAGNOSTIC STUDIES: Oxygen Saturation is 100% on RA, normal by my interpretation.    COORDINATION OF CARE: 5:26 PM-Discussed treatment plan which includes Rx muscle relaxer with pt at  bedside and pt agreed to plan.   Labs Review Labs Reviewed - No data to display  Imaging Review No results found.    EKG Interpretation None      MDM   Final diagnoses:  Chronic back pain    patient with chronic lower back pain. No new injuries. I reviewed patient's records. Patient had a visit with pain management doctor 2 weeks ago who recommended no opiate treatment for her pain. She was given Neurontin which she is not taking because "it does not help." She is asking for narcotics saying that the only thing that helps. We'll treat her with one dose of Percocet in the emergency department. Explained to her that since her pain management doctor chose not to prescribe any narcotics that she would need to follow-up with him and let him know that she is hurting worse. Pt is ambulatory. No evidence of cauda equina. Home with pcp follow up.   Filed Vitals:   10/27/15 1639 10/27/15 1743  BP: 115/76 121/82  Pulse: 102 103  Temp: 97.6 F (36.4 C)   TempSrc: Oral   Resp: 18 18  SpO2: 100% 100%    I personally performed the services described in this  documentation, which was scribed in my presence. The recorded information has been reviewed and is accurate.    Jeannett Senior, PA-C 10/27/15 2032  Wandra Arthurs, MD 10/27/15 [phone]

## 2015-10-27 NOTE — Discharge Instructions (Signed)
Please follow-up with your primary care doctor for further treatment of your chronic pain. Avoid any strenuous activity. Try heating pads. Continue ibuprofen and Tylenol.  Chronic Back Pain  When back pain lasts longer than 3 months, it is called chronic back pain.People with chronic back pain often go through certain periods that are more intense (flare-ups).  CAUSES Chronic back pain can be caused by wear and tear (degeneration) on different structures in your back. These structures include:  The bones of your spine (vertebrae) and the joints surrounding your spinal cord and nerve roots (facets).  The strong, fibrous tissues that connect your vertebrae (ligaments). Degeneration of these structures may result in pressure on your nerves. This can lead to constant pain. HOME CARE INSTRUCTIONS  Avoid bending, heavy lifting, prolonged sitting, and activities which make the problem worse.  Take brief periods of rest throughout the day to reduce your pain. Lying down or standing usually is better than sitting while you are resting.  Take over-the-counter or prescription medicines only as directed by your caregiver. SEEK IMMEDIATE MEDICAL CARE IF:   You have weakness or numbness in one of your legs or feet.  You have trouble controlling your bladder or bowels.  You have nausea, vomiting, abdominal pain, shortness of breath, or fainting.   This information is not intended to replace advice given to you by your health care provider. Make sure you discuss any questions you have with your health care provider.   Document Released: 11/21/2004 Document Revised: 01/06/2012 Document Reviewed: 04/03/2015 Elsevier Interactive Patient Education [employer].

## 2015-10-27 NOTE — ED Notes (Signed)
Pt c/o left hip pain x 1 month, worse with ambulation. Has been treated for the same before.

## 2015-11-30 ENCOUNTER — Encounter (HOSPITAL_COMMUNITY): Payer: Self-pay | Admitting: *Deleted

## 2015-12-08 ENCOUNTER — Other Ambulatory Visit: Payer: Self-pay | Admitting: Gastroenterology

## 2015-12-12 NOTE — Addendum Note (Signed)
Addended by: Teena Irani on: 12/12/2015 09:14 AM   Modules accepted: Orders

## 2015-12-14 ENCOUNTER — Encounter (HOSPITAL_COMMUNITY)
Admission: RE | Disposition: A | Discharge status: Home / Self Care | Payer: Self-pay | Source: Ambulatory Visit | Attending: Gastroenterology

## 2015-12-14 ENCOUNTER — Encounter (HOSPITAL_COMMUNITY): Payer: Self-pay | Admitting: Anesthesiology

## 2015-12-14 ENCOUNTER — Ambulatory Visit (HOSPITAL_COMMUNITY): Payer: Medicaid Other | Admitting: Anesthesiology

## 2015-12-14 ENCOUNTER — Ambulatory Visit (HOSPITAL_COMMUNITY)
Admission: RE | Admit: 2015-12-14 | Discharge: 2015-12-14 | Disposition: A | Discharge status: Home / Self Care | Payer: Medicaid Other | Source: Ambulatory Visit | Attending: Gastroenterology | Admitting: Gastroenterology

## 2015-12-14 DIAGNOSIS — M479 Spondylosis, unspecified: Secondary | ICD-10-CM | POA: Diagnosis not present

## 2015-12-14 DIAGNOSIS — R1310 Dysphagia, unspecified: Secondary | ICD-10-CM | POA: Diagnosis present

## 2015-12-14 DIAGNOSIS — K2950 Unspecified chronic gastritis without bleeding: Secondary | ICD-10-CM | POA: Insufficient documentation

## 2015-12-14 DIAGNOSIS — E119 Type 2 diabetes mellitus without complications: Secondary | ICD-10-CM | POA: Insufficient documentation

## 2015-12-14 DIAGNOSIS — M419 Scoliosis, unspecified: Secondary | ICD-10-CM | POA: Insufficient documentation

## 2015-12-14 DIAGNOSIS — F17210 Nicotine dependence, cigarettes, uncomplicated: Secondary | ICD-10-CM | POA: Diagnosis not present

## 2015-12-14 DIAGNOSIS — K5730 Diverticulosis of large intestine without perforation or abscess without bleeding: Secondary | ICD-10-CM | POA: Diagnosis not present

## 2015-12-14 DIAGNOSIS — E669 Obesity, unspecified: Secondary | ICD-10-CM | POA: Insufficient documentation

## 2015-12-14 DIAGNOSIS — K222 Esophageal obstruction: Secondary | ICD-10-CM | POA: Insufficient documentation

## 2015-12-14 DIAGNOSIS — E079 Disorder of thyroid, unspecified: Secondary | ICD-10-CM | POA: Diagnosis not present

## 2015-12-14 DIAGNOSIS — F319 Bipolar disorder, unspecified: Secondary | ICD-10-CM | POA: Insufficient documentation

## 2015-12-14 DIAGNOSIS — K625 Hemorrhage of anus and rectum: Secondary | ICD-10-CM | POA: Diagnosis not present

## 2015-12-14 DIAGNOSIS — F209 Schizophrenia, unspecified: Secondary | ICD-10-CM | POA: Diagnosis not present

## 2015-12-14 DIAGNOSIS — I10 Essential (primary) hypertension: Secondary | ICD-10-CM | POA: Insufficient documentation

## 2015-12-14 DIAGNOSIS — Z6839 Body mass index (BMI) 39.0-39.9, adult: Secondary | ICD-10-CM | POA: Diagnosis not present

## 2015-12-14 DIAGNOSIS — F4310 Post-traumatic stress disorder, unspecified: Secondary | ICD-10-CM | POA: Insufficient documentation

## 2015-12-14 DIAGNOSIS — K648 Other hemorrhoids: Secondary | ICD-10-CM | POA: Diagnosis not present

## 2015-12-14 DIAGNOSIS — Z791 Long term (current) use of non-steroidal anti-inflammatories (NSAID): Secondary | ICD-10-CM | POA: Insufficient documentation

## 2015-12-14 DIAGNOSIS — M549 Dorsalgia, unspecified: Secondary | ICD-10-CM | POA: Insufficient documentation

## 2015-12-14 DIAGNOSIS — R1013 Epigastric pain: Secondary | ICD-10-CM | POA: Diagnosis not present

## 2015-12-14 DIAGNOSIS — M1990 Unspecified osteoarthritis, unspecified site: Secondary | ICD-10-CM | POA: Insufficient documentation

## 2015-12-14 DIAGNOSIS — K219 Gastro-esophageal reflux disease without esophagitis: Secondary | ICD-10-CM | POA: Diagnosis not present

## 2015-12-14 DIAGNOSIS — Z79899 Other long term (current) drug therapy: Secondary | ICD-10-CM | POA: Insufficient documentation

## 2015-12-14 DIAGNOSIS — G8929 Other chronic pain: Secondary | ICD-10-CM | POA: Insufficient documentation

## 2015-12-14 DIAGNOSIS — J45909 Unspecified asthma, uncomplicated: Secondary | ICD-10-CM | POA: Insufficient documentation

## 2015-12-14 DIAGNOSIS — Z7982 Long term (current) use of aspirin: Secondary | ICD-10-CM | POA: Insufficient documentation

## 2015-12-14 HISTORY — DX: Unspecified osteoarthritis, unspecified site: M19.90

## 2015-12-14 HISTORY — PX: BALLOON DILATION: SHX5330

## 2015-12-14 HISTORY — PX: COLONOSCOPY WITH PROPOFOL: SHX5780

## 2015-12-14 HISTORY — PX: ESOPHAGOGASTRODUODENOSCOPY (EGD) WITH PROPOFOL: SHX5813

## 2015-12-14 HISTORY — DX: Scoliosis, unspecified: M41.9

## 2015-12-14 LAB — GLUCOSE, CAPILLARY: GLUCOSE-CAPILLARY: 81 mg/dL (ref 65–99)

## 2015-12-14 SURGERY — ESOPHAGOGASTRODUODENOSCOPY (EGD) WITH PROPOFOL
Anesthesia: Monitor Anesthesia Care

## 2015-12-14 MED ORDER — MIDAZOLAM HCL 5 MG/5ML IJ SOLN
INTRAMUSCULAR | Status: DC | PRN
  Administered 2015-12-14: 2 mg via INTRAVENOUS

## 2015-12-14 MED ORDER — BUTAMBEN-TETRACAINE-BENZOCAINE 2-2-14 % EX AERO
INHALATION_SPRAY | CUTANEOUS | Status: DC | PRN
  Administered 2015-12-14: 2 via TOPICAL

## 2015-12-14 MED ORDER — PROPOFOL 10 MG/ML IV BOLUS
INTRAVENOUS | Status: AC
  Filled 2015-12-14: qty 60

## 2015-12-14 MED ORDER — GLYCOPYRROLATE 0.2 MG/ML IJ SOLN
INTRAMUSCULAR | Status: AC
  Filled 2015-12-14: qty 1

## 2015-12-14 MED ORDER — LIDOCAINE HCL (CARDIAC) 20 MG/ML IV SOLN
INTRAVENOUS | Status: AC
  Filled 2015-12-14: qty 5

## 2015-12-14 MED ORDER — LACTATED RINGERS IV SOLN
INTRAVENOUS | Status: DC
  Administered 2015-12-14: 1000 mL via INTRAVENOUS

## 2015-12-14 MED ORDER — MIDAZOLAM HCL 2 MG/2ML IJ SOLN
INTRAMUSCULAR | Status: AC
  Filled 2015-12-14: qty 2

## 2015-12-14 MED ORDER — GLYCOPYRROLATE 0.2 MG/ML IJ SOLN
INTRAMUSCULAR | Status: DC | PRN
  Administered 2015-12-14: .2 mg via INTRAVENOUS

## 2015-12-14 MED ORDER — PROPOFOL 500 MG/50ML IV EMUL
INTRAVENOUS | Status: DC | PRN
  Administered 2015-12-14: 140 ug/kg/min via INTRAVENOUS

## 2015-12-14 MED ORDER — SODIUM CHLORIDE 0.9 % IV SOLN: INTRAVENOUS | Status: DC

## 2015-12-14 SURGICAL SUPPLY — 24 items

## 2015-12-14 NOTE — Transfer of Care (Signed)
Immediate Anesthesia Transfer of Care Note  Patient: Alicia Blake  Procedure(s) Performed: Procedure(s): ESOPHAGOGASTRODUODENOSCOPY (EGD) WITH PROPOFOL (N/A) BALLOON DILATION (N/A) COLONOSCOPY WITH PROPOFOL (N/A)  Patient Location: Endoscopy Unit  Anesthesia Type:MAC  Level of Consciousness: awake  Airway & Oxygen Therapy: Patient Spontanous Breathing and Patient connected to face mask oxygen  Post-op Assessment: Report given to RN and Post -op Vital signs reviewed and stable  Post vital signs: Reviewed and stable  Last Vitals:  Filed Vitals:   12/14/15 0818  BP: 106/75  Pulse: 91  Temp: 36.8 C  Resp: 12    Complications: No apparent anesthesia complications

## 2015-12-14 NOTE — Anesthesia Preprocedure Evaluation (Addendum)
Anesthesia Evaluation  Patient identified by MRN, date of birth, ID band Patient awake    Reviewed: Allergy & Precautions, NPO status , Patient's Chart, lab work & pertinent test results  Airway Mallampati: II  TM Distance: >3 FB Neck ROM: Full    Dental no notable dental hx.    Pulmonary asthma , Current Smoker,    Pulmonary exam normal breath sounds clear to auscultation       Cardiovascular hypertension, Pt. on medications Normal cardiovascular exam Rhythm:Regular Rate:Normal     Neuro/Psych PSYCHIATRIC DISORDERS Depression Bipolar Disorder Schizophrenia negative neurological ROS     GI/Hepatic GERD  Medicated,(+)     substance abuse  alcohol use, cocaine use and marijuana use,   Endo/Other  diabetes, Type 2Morbid obesity  Renal/GU negative Renal ROS  negative genitourinary   Musculoskeletal  (+) Arthritis ,   Abdominal (+) + obese,   Peds negative pediatric ROS (+)  Hematology negative hematology ROS (+)   Anesthesia Other Findings   Reproductive/Obstetrics negative OB ROS                             Anesthesia Physical Anesthesia Plan  ASA: III  Anesthesia Plan: MAC   Post-op Pain Management:    Induction: Intravenous  Airway Management Planned: Natural Airway  Additional Equipment:   Intra-op Plan:   Post-operative Plan:   Informed Consent: I have reviewed the patients History and Physical, chart, labs and discussed the procedure including the risks, benefits and alternatives for the proposed anesthesia with the patient or authorized representative who has indicated his/her understanding and acceptance.   Dental advisory given  Plan Discussed with: CRNA  Anesthesia Plan Comments:         Anesthesia Quick Evaluation

## 2015-12-14 NOTE — Op Note (Signed)
[facility] [place], [id number]   COLONOSCOPY PROCEDURE REPORT     EXAM DATE: Dec 30, 2015  PATIENT NAME:      Alicia Blake, Alicia Blake           MR #:      [id number]  BIRTHDATE:       [birth year]      VISIT #:     [id number]  ATTENDING:     Teena Irani, MD     STATUS:     outpatient ASSISTANT:      Tory Emerald and Cristopher Estimable  INDICATIONS:  The patient is a 50 yr old female here for a colonoscopy due to PROCEDURE PERFORMED: MEDICATIONS:     MAC ESTIMATED BLOOD LOSS:     None  CONSENT: The patient understands the risks and benefits of the procedure and understands that these risks include, but are not limited to: sedation, allergic reaction, infection, perforation and/or bleeding. Alternative means of evaluation and treatment include, among others: physical exam, x-rays, and/or surgical intervention. The patient elects to proceed with this endoscopic procedure.  DESCRIPTION OF PROCEDURE: During intra-op preparation period all mechanical & medical equipment was checked for proper function. Hand hygiene and appropriate measures for infection prevention was taken. After the risks, benefits and alternatives of the procedure were thoroughly explained, Informed consent was verified, confirmed and timeout was successfully executed by the treatment team. A digital exam    The Pentax Ped Colon [id number] endoscope was introduced through the anus and advanced to the cecum     . rep quality was poor in the cecum and proximal ascending colon and in parts of the transverse colon I could not rule out  polyps or evensmall mass in 70s areas.     The instrument was then slowly withdrawn as the colon was fully examined.Estimated blood loss is zero unless otherwise noted in this procedure report. solid stool in the cecum precluded visualization of about a third of it. Otherwise the cecum appeared normal. There were some scattered diverticuli in the ascending  colon. The transverse descending sigmoid and rectum were well inspected and appeared tobe within normal limits except for small internal hemorrhoids seen on retroflexed view.         The scope was then completely withdrawn from the patient and the procedure terminated. SCOPE WITHDRAWAL TIME:    ADVERSE EVENTS:      There were no immediate complications.  IMPRESSIONS:     right-sided diverticulosis small internal hemorrhoids incomplete visualization of the cecum and parts of the transverse colon.  RECOMMENDATIONS:     treat hemorrhoids if bleeding. RECALL:     repeat colonoscopy in5- 10 years  _____________________________ Teena Irani, MD eSigned:  Teena Irani, MD Dec 30, 2015 9:51 AM   cc:   CPT CODES: ICD CODES:  The ICD and CPT codes recommended by this software are interpretations from the data that the clinical staff has captured with the software.  The verification of the translation of this report to the ICD and CPT codes and modifiers is the sole responsibility of the health care institution and practicing physician where this report was generated.  [employer] will not be held responsible for the validity of the ICD and CPT codes included on this report.  AMA assumes no liability for data contained or not contained herein. CPT is a Designer, television/film set of the [employer].   PATIENT NAME:  Alicia Blake, Alicia Blake MR#: [id number]

## 2015-12-14 NOTE — Anesthesia Postprocedure Evaluation (Signed)
Anesthesia Post Note  Patient: Alicia Blake  Procedure(s) Performed: Procedure(s) (LRB): ESOPHAGOGASTRODUODENOSCOPY (EGD) WITH PROPOFOL (N/A) BALLOON DILATION (N/A) COLONOSCOPY WITH PROPOFOL (N/A)  Patient location during evaluation: PACU Anesthesia Type: MAC Level of consciousness: awake and alert Pain management: pain level controlled Vital Signs Assessment: post-procedure vital signs reviewed and stable Respiratory status: spontaneous breathing, nonlabored ventilation, respiratory function stable and patient connected to nasal cannula oxygen Cardiovascular status: stable and blood pressure returned to baseline Anesthetic complications: no    Last Vitals:  Filed Vitals:   12/14/15 1010 12/14/15 1020  BP: 137/93 129/92  Pulse: 96 100  Temp:    Resp: 15 16    Last Pain: There were no vitals filed for this visit.               Adis Sturgill J

## 2015-12-14 NOTE — H&P (Signed)
[facility] Consult Note  Referring Provider: No ref. provider found Primary Care Physician:  Elbert Ewings, [place] Primary Gastroenterologist:  Dr.  Laurel Dimmer Complaint: Dysphagia and rectal bleeding HPI: Alicia Blake is an 50 y.o. black female  who presents for EGD with esophageal dilatation and colonoscopy, for the indications of dysphagia and rectal bleeding respectively.  Past Medical History  Diagnosis Date  . Hypertension   . Asthma   . Bronchitis   . GERD (gastroesophageal reflux disease)   . Thyroid disease   . PTSD (post-traumatic stress disorder)   . Bipolar affect, depressed ([place])   . Chronic back pain   . Diabetes mellitus without complication ([facility])     DIET CONTROLLED  . DJD (degenerative joint disease)     BACK  . Scoliosis     Past Surgical History  Procedure Laterality Date  . Neck surgery  AS TEENAGER    STITCHES TO NECK   . Left ankle surgery  MARCH 2016  . 2 left toes surgery  MARCH 2016  . Surgery for cut on stomach  TEENAGER    Medications Prior to Admission  Medication Sig Dispense Refill  . albuterol (PROVENTIL HFA;VENTOLIN HFA) 108 (90 BASE) MCG/ACT inhaler Inhale 2 puffs into the lungs every 6 (six) hours as needed for wheezing.    Marland Kitchen albuterol (PROVENTIL) (2.5 MG/3ML) 0.083% nebulizer solution Take 2.5 mg by nebulization every 6 (six) hours as needed for wheezing or shortness of breath.    Marland Kitchen aspirin EC 81 MG tablet Take 81 mg by mouth every morning.     . divalproex (DEPAKOTE) 500 MG DR tablet Take 1 tablet (500 mg total) by mouth every 12 (twelve) hours. 60 tablet 0  . furosemide (LASIX) 20 MG tablet Take 20 mg by mouth daily.    . hydrOXYzine (ATARAX/VISTARIL) 25 MG tablet Take 1 tablet (25 mg total) by mouth every 6 (six) hours as needed for anxiety. 30 tablet 0  . ibuprofen (ADVIL,MOTRIN) 800 MG tablet Take 1 tablet (800 mg total) by mouth 3 (three) times daily. 21 tablet 0  . lisinopril (PRINIVIL,ZESTRIL) 10 MG tablet Take 1  tablet (10 mg total) by mouth daily.    . pantoprazole (PROTONIX) 20 MG tablet Take 1 tablet (20 mg total) by mouth daily. 30 tablet 0  . QUEtiapine (SEROQUEL) 300 MG tablet Take 1 tablet (300 mg total) by mouth at bedtime. 30 tablet 0  . tiotropium (SPIRIVA) 18 MCG inhalation capsule Place 18 mcg into inhaler and inhale daily.    Marland Kitchen lidocaine (LIDODERM) 5 % Place 1 patch onto the skin daily. Remove & Discard patch within 12 hours or as directed by MD (Patient not taking: Reported on 10/18/2015) 30 patch 0  . methocarbamol (ROBAXIN) 500 MG tablet Take 1 tablet (500 mg total) by mouth 2 (two) times daily. (Patient not taking: Reported on 11/30/2015) 20 tablet 0  . omeprazole (PRILOSEC) 20 MG capsule Take 1 capsule (20 mg total) by mouth daily. (Patient not taking: Reported on 10/18/2015)    . oxyCODONE (OXY IR/ROXICODONE) 5 MG immediate release tablet Take 1 tablet (5 mg total) by mouth daily as needed for severe pain. (Patient not taking: Reported on 10/18/2015) 5 tablet 0  . ranitidine (ZANTAC) 150 MG capsule Take 1 capsule (150 mg total) by mouth 2 (two) times daily. (Patient not taking: Reported on 11/30/2015) 30 capsule 0    Allergies:  Allergies  Allergen Reactions  . Crab [Shellfish Allergy] Swelling    fever  fever  . Lithium Other (See Comments)    Other reaction(s): Other - See Comments Speech impairment/pt states that she is not sure if she had an reactions Speech impairment Pt denies allergy  . Other Hives and Itching    crablegs only     Family History  Problem Relation Age of Onset  . Hypertension Mother   . Hypertension Maternal Grandmother   . Hypertension Paternal Grandmother   . Diabetes Paternal Grandmother   . Hypertension Paternal Grandfather   . Diabetes Paternal Grandfather   . Alcohol abuse Paternal Aunt   . Mental illness Cousin     Social History:  reports that she has been smoking Cigarettes.  She has been smoking about 0.25 packs per day. She has never used  smokeless tobacco. She reports that she uses illicit drugs ("Crack" cocaine). She reports that she does not drink alcohol.  Review of Systems: negative except as above   Blood pressure 106/75, pulse 91, temperature 98.3 F (36.8 C), temperature source Oral, resp. rate 12, height 5\' 6"  (1.676 m), weight 110.224 kg (243 lb), last menstrual period 07/14/2013, SpO2 98 %. Head: Normocephalic, without obvious abnormality, atraumatic Neck: no adenopathy, no carotid bruit, no JVD, supple, symmetrical, trachea midline and thyroid not enlarged, symmetric, no tenderness/mass/nodules Resp: clear to auscultation bilaterally Cardio: regular rate and rhythm, S1, S2 normal, no murmur, click, rub or gallop GI: Abdomen soft nondistended with normoactive bowel sounds. Extremities: extremities normal, atraumatic, no cyanosis or edema  Results for orders placed or performed during the hospital encounter of 12/14/15 (from the past 48 hour(s))  Glucose, capillary     Status: None   Collection Time: 12/14/15  8:33 AM  Result Value Ref Range   Glucose-Capillary 81 65 - 99 mg/dL   No results found.  Assessment: 1. Rectal bleeding 2. Dysphagia Plan:  1. EGD with esophageal dilatation 2. Colonoscopy Dalyn Kjos C 12/14/2015, 9:07 AM  Pager [phone] If no answer or after 5 PM call [phone]

## 2015-12-14 NOTE — Discharge Instructions (Signed)
Esophagogastroduodenoscopy, Care After Refer to this sheet in the next few weeks. These instructions provide you with information about caring for yourself after your procedure. Your health care provider may also give you more specific instructions. Your treatment has been planned according to current medical practices, but problems sometimes occur. Call your health care provider if you have any problems or questions after your procedure. WHAT TO EXPECT AFTER THE PROCEDURE After your procedure, it is typical to feel:  Soreness in your throat.  Pain with swallowing.  Sick to your stomach (nauseous).  Bloated.  Dizzy.  Fatigued. HOME CARE INSTRUCTIONS  Do not eat or drink anything until the numbing medicine (local anesthetic) has worn off and your gag reflex has returned. You will know that the local anesthetic has worn off when you can swallow comfortably.  Do not drive or operate machinery until directed by your health care provider.  Take medicines only as directed by your health care provider. SEEK MEDICAL CARE IF:   You cannot stop coughing.  You are not urinating at all or less than usual. SEEK IMMEDIATE MEDICAL CARE IF:  You have difficulty swallowing.  You cannot eat or drink.  You have worsening throat or chest pain.  You have dizziness or lightheadedness or you faint.  You have nausea or vomiting.  You have chills.  You have a fever.  You have severe abdominal pain.  You have black, tarry, or bloody stools.   This information is not intended to replace advice given to you by your health care provider. Make sure you discuss any questions you have with your health care provider.   Document Released: 09/30/2012 Document Revised: 11/04/2014 Document Reviewed: 09/30/2012 Elsevier Interactive Patient Education 2016 [employer]. Colonoscopy, Care After These instructions give you information on caring for yourself after your procedure. Your doctor may also give  you more specific instructions. Call your doctor if you have any problems or questions after your procedure. HOME CARE  Do not drive for 24 hours.  Do not sign important papers or use machinery for 24 hours.  You may shower.  You may go back to your usual activities, but go slower for the first 24 hours.  Take rest breaks often during the first 24 hours.  Walk around or use warm packs on your belly (abdomen) if you have belly cramping or gas.  Drink enough fluids to keep your pee (urine) clear or pale yellow.  Resume your normal diet. Avoid heavy or fried foods.  Avoid drinking alcohol for 24 hours or as told by your doctor.  Only take medicines as told by your doctor. If a tissue sample (biopsy) was taken during the procedure:   Do not take aspirin or blood thinners for 7 days, or as told by your doctor.  Do not drink alcohol for 7 days, or as told by your doctor.  Eat soft foods for the first 24 hours. GET HELP IF: You still have a small amount of blood in your poop (stool) 2-3 days after the procedure. GET HELP RIGHT AWAY IF:  You have more than a small amount of blood in your poop.  You see clumps of tissue (blood clots) in your poop.  Your belly is puffy (swollen).  You feel sick to your stomach (nauseous) or throw up (vomit).  You have a fever.  You have belly pain that gets worse and medicine does not help. MAKE SURE YOU:  Understand these instructions.  Will watch your condition.  Will  get help right away if you are not doing well or get worse.   This information is not intended to replace advice given to you by your health care provider. Make sure you discuss any questions you have with your health care provider.   Document Released: 11/16/2010 Document Revised: 10/19/2013 Document Reviewed: 06/21/2013 Elsevier Interactive Patient Education [employer].

## 2015-12-14 NOTE — Op Note (Signed)
[facility] [place], [address]   ENDOSCOPY PROCEDURE REPORT  PATIENT: Alicia Blake, Alicia Blake  MR#: [id number] BIRTHDATE: [birth year] , 49  yrs. old GENDER: female ENDOSCOPIST: Teena Irani, MD REFERRED BY: PROCEDURE DATE:  2015-12-16 PROCEDURE: ASA CLASS: INDICATIONS:  dysphagia and epigastric pain MEDICATIONS: MAC TOPICAL ANESTHETIC:  DESCRIPTION OF PROCEDURE: After the risks benefits and alternatives of the procedure were thoroughly explained, informed consent was obtained.  The Pentax Gastroscope [id number] endoscope was introduced through the mouth and advanced to the second portion of the duodenum , Without limitations.  The instrument was slowly withdrawn as the mucosa was fully examined. Estimated blood loss is zero unless otherwise noted in this procedure report.    Esophagus; Widely patent lower esophageal ring, dilated over a guidewire to 16 mm Savary dilator. Stomach: Mild to moderate antral gastritis, biopsies taken for H. pylori. Duodenum: Normal          The scope was then withdrawn from the patient and the procedure completed.  COMPLICATIONS: There were no immediate complications.  ENDOSCOPIC IMPRESSION: lower esophageal ring, status post dilatation Antral gastritis  RECOMMENDATIONS: wait biopsies and observe response to dilatation in terms of dysphagia.  REPEAT EXAM:  eSigned:  Teena Irani, MD 2015-12-16 9:34 AM    CC:  CPT CODES: ICD CODES:  The ICD and CPT codes recommended by this software are interpretations from the data that the clinical staff has captured with the software.  The verification of the translation of this report to the ICD and CPT codes and modifiers is the sole responsibility of the health care institution and practicing physician where this report was generated.  [facility]. will not be held responsible for the validity of the ICD and CPT codes included on this report.  AMA assumes  no liability for data contained or not contained herein. CPT is a Designer, television/film set of the [employer].

## 2015-12-15 ENCOUNTER — Encounter (HOSPITAL_COMMUNITY): Payer: Self-pay | Admitting: Gastroenterology

## 2016-01-31 ENCOUNTER — Encounter (HOSPITAL_COMMUNITY): Payer: Self-pay | Admitting: *Deleted

## 2016-01-31 ENCOUNTER — Ambulatory Visit (HOSPITAL_COMMUNITY)
Admission: EM | Admit: 2016-01-31 | Discharge: 2016-01-31 | Disposition: A | Discharge status: Home / Self Care | Payer: Medicaid Other | Attending: Family Medicine | Admitting: Family Medicine

## 2016-01-31 DIAGNOSIS — M79605 Pain in left leg: Secondary | ICD-10-CM

## 2016-01-31 LAB — POCT URINALYSIS DIP (DEVICE)
BILIRUBIN URINE: NEGATIVE
GLUCOSE, UA: NEGATIVE mg/dL
Hgb urine dipstick: NEGATIVE
KETONES UR: NEGATIVE mg/dL
NITRITE: NEGATIVE
PH: 5.5 (ref 5.0–8.0)
Protein, ur: NEGATIVE mg/dL
Specific Gravity, Urine: 1.015 (ref 1.005–1.030)
Urobilinogen, UA: 0.2 mg/dL (ref 0.0–1.0)

## 2016-01-31 MED ORDER — DICLOFENAC POTASSIUM 50 MG PO TABS: 50.0000 mg | ORAL_TABLET | Freq: Three times a day (TID) | ORAL | Status: DC

## 2016-01-31 MED ORDER — CYCLOBENZAPRINE HCL 5 MG PO TABS: 5.0000 mg | ORAL_TABLET | Freq: Three times a day (TID) | ORAL | Status: DC | PRN

## 2016-01-31 NOTE — ED Notes (Signed)
Pt   Reports    l  Hip       Pain      sev  Weeks     X  sev  Weeks  Pt  Has  A  History  Of  Arthritis   And    Is   Scheduled for  Surgery       Pt  denys  Any  Recent  specefic  injury  Pt      Reports     Some  Constipation

## 2016-01-31 NOTE — ED Provider Notes (Signed)
CSN: [id number]     Arrival date & time 01/31/16  1335 History   First MD Initiated Contact with Patient 01/31/16 1525     Chief Complaint  Patient presents with  . Back Pain   (Consider location/radiation/quality/duration/timing/severity/associated sxs/prior Treatment) Patient is a 50 y.o. female presenting with back pain. The history is provided by the patient.  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 Hypertension   . Asthma   . Bronchitis   . GERD (gastroesophageal reflux disease)   . Thyroid disease   . PTSD (post-traumatic stress disorder)   . Bipolar affect, depressed ([place])   . Chronic back pain   . Diabetes mellitus without complication (HCC)     DIET CONTROLLED  . DJD (degenerative joint disease)     BACK  . Scoliosis    Past Surgical History  Procedure Laterality Date  . Neck surgery  AS TEENAGER    STITCHES TO NECK   . Left ankle surgery  MARCH 2016  . 2 left toes surgery  MARCH 2016  . Surgery for cut on stomach  TEENAGER  . Esophagogastroduodenoscopy (egd) with propofol N/A 12/14/2015    Procedure: ESOPHAGOGASTRODUODENOSCOPY (EGD) WITH PROPOFOL;  Surgeon: Teena Irani, MD;  Location: [facility];  Service: Endoscopy;  Laterality: N/A;  . Balloon dilation N/A 12/14/2015    Procedure: BALLOON DILATION;  Surgeon: Teena Irani, MD;  Location: [facility];  Service: Endoscopy;  Laterality: N/A;  . Colonoscopy with propofol N/A 12/14/2015    Procedure: COLONOSCOPY WITH PROPOFOL;  Surgeon: Teena Irani, MD;  Location: [facility]  ENDOSCOPY;  Service: Endoscopy;  Laterality: N/A;   Family History  Problem Relation Age of Onset  . Hypertension Mother   . Hypertension Maternal Grandmother   . Hypertension Paternal Grandmother   . Diabetes Paternal Grandmother   . Hypertension Paternal Grandfather   . Diabetes Paternal Grandfather   . Alcohol abuse Paternal Aunt   . Mental illness Cousin    Social History  Substance Use Topics  . Smoking status: Current Every Day Smoker -- 0.25 packs/day    Types: Cigarettes  . Smokeless tobacco: Never Used  . Alcohol Use: No     Comment: quit date 01/17/2013   OB History    Gravida Para Term Preterm AB TAB SAB Ectopic Multiple Living   1 1 1       1      Review of Systems  Constitutional: Negative.   HENT: Positive for sneezing.   Eyes: Positive for itching.  Respiratory: Positive for shortness of breath and wheezing.   Cardiovascular: Negative.   Gastrointestinal: Negative.   Genitourinary: Negative.  Negative for flank pain and pelvic pain.  Musculoskeletal: Positive for back pain and joint swelling. Negative for gait problem.  Skin: Negative.   Neurological: Positive for paresthesias. Negative for numbness.  All other systems reviewed and are negative.   Allergies  Crab; Lithium; and Other  Home Medications   Prior to Admission medications   Medication Sig Start Date End Date Taking?  Authorizing Provider  albuterol (PROVENTIL HFA;VENTOLIN HFA) 108 (90 BASE) MCG/ACT inhaler Inhale 2 puffs into the lungs every 6 (six) hours as needed for wheezing.    Historical Provider, MD  albuterol (PROVENTIL) (2.5 MG/3ML) 0.083% nebulizer solution Take 2.5 mg by nebulization every 6 (six) hours as needed for wheezing or shortness of breath.    Historical Provider, MD  aspirin EC 81 MG tablet Take 81 mg by mouth every morning.     Historical Provider, MD  cyclobenzaprine (FLEXERIL) 5 MG tablet Take 1 tablet (5 mg total) by mouth 3 (three) times daily as needed for muscle  spasms. 01/31/16   Billy Fischer, MD  diclofenac (CATAFLAM) 50 MG tablet Take 1 tablet (50 mg total) by mouth 3 (three) times daily. 01/31/16   Billy Fischer, MD  divalproex (DEPAKOTE) 500 MG DR tablet Take 1 tablet (500 mg total) by mouth every 12 (twelve) hours. 09/12/15   Benjamine Mola, FNP  furosemide (LASIX) 20 MG tablet Take 20 mg by mouth daily.    Historical Provider, MD  hydrOXYzine (ATARAX/VISTARIL) 25 MG tablet Take 1 tablet (25 mg total) by mouth every 6 (six) hours as needed for anxiety. 09/12/15   Benjamine Mola, FNP  ibuprofen (ADVIL,MOTRIN) 800 MG tablet Take 1 tablet (800 mg total) by mouth 3 (three) times daily. 10/18/15   Gloriann Loan, PA-C  lidocaine (LIDODERM) 5 % Place 1 patch onto the skin daily. Remove & Discard patch within 12 hours or as directed by MD Patient not taking: Reported on 10/18/2015 09/12/15   Benjamine Mola, FNP  lisinopril (PRINIVIL,ZESTRIL) 10 MG tablet Take 1 tablet (10 mg total) by mouth daily. 09/12/15   Benjamine Mola, FNP  methocarbamol (ROBAXIN) 500 MG tablet Take 1 tablet (500 mg total) by mouth 2 (two) times daily. Patient not taking: Reported on 11/30/2015 10/27/15   Jeannett Senior, PA-C  omeprazole (PRILOSEC) 20 MG capsule Take 1 capsule (20 mg total) by mouth daily. Patient not taking: Reported on 10/18/2015 09/12/15   Benjamine Mola, FNP  oxyCODONE (OXY IR/ROXICODONE) 5 MG immediate release tablet Take 1 tablet (5 mg total) by mouth daily as needed for severe pain. Patient not taking: Reported on 10/18/2015 09/12/15   Benjamine Mola, FNP  pantoprazole (PROTONIX) 20 MG tablet Take 1 tablet (20 mg total) by mouth daily. 10/18/15   Gloriann Loan, PA-C  QUEtiapine (SEROQUEL) 300 MG tablet Take 1 tablet (300 mg total) by mouth at bedtime. 09/12/15   Benjamine Mola, FNP  tiotropium (SPIRIVA) 18 MCG inhalation capsule Place 18 mcg into inhaler and inhale daily.    Historical Provider, MD   Meds Ordered and Administered this Visit  Medications - No data to  display  BP 116/79 mmHg  Pulse 95  Temp(Src) 97.8 F (36.6 C) (Oral)  Resp 16  SpO2 100%  LMP 07/14/2013 No data found.   Physical Exam  Constitutional: She is oriented to person, place, and time. She appears well-developed and well-nourished.  Abdominal: Soft. Bowel sounds are normal. There is no tenderness.  Musculoskeletal: She exhibits tenderness. She exhibits no edema.       Lumbar back: She exhibits tenderness, bony tenderness and pain. She exhibits no swelling, no spasm and normal pulse.  Neurological: She is alert and oriented to person, place, and time.  Skin: Skin is warm and dry.  Nursing note and vitals reviewed.   ED Course  Procedures (including critical care time)  Labs Review Labs Reviewed  POCT URINALYSIS DIP (DEVICE) - Abnormal; Notable for the following:    Leukocytes, UA TRACE (*)    All other components within normal limits    Imaging Review No results found.   Visual Acuity Review  Right Eye Distance:   Left Eye Distance:   Bilateral Distance:    Right Eye Near:   Left Eye Near:    Bilateral Near:         MDM   1. Pain of left lower extremity        Billy Fischer, MD 01/31/16 [phone]

## 2016-02-14 ENCOUNTER — Other Ambulatory Visit: Payer: Self-pay | Admitting: Gastroenterology

## 2016-02-14 DIAGNOSIS — R1310 Dysphagia, unspecified: Secondary | ICD-10-CM

## 2016-02-19 ENCOUNTER — Other Ambulatory Visit: Payer: Self-pay

## 2016-03-04 ENCOUNTER — Other Ambulatory Visit: Payer: Self-pay

## 2016-03-07 ENCOUNTER — Other Ambulatory Visit: Payer: Self-pay

## 2016-03-15 ENCOUNTER — Other Ambulatory Visit: Payer: Self-pay

## 2016-03-19 ENCOUNTER — Ambulatory Visit
Admission: RE | Admit: 2016-03-19 | Discharge: 2016-03-19 | Disposition: A | Discharge status: Home / Self Care | Payer: Medicaid Other | Source: Ambulatory Visit | Attending: Gastroenterology | Admitting: Gastroenterology

## 2016-03-19 DIAGNOSIS — R1310 Dysphagia, unspecified: Secondary | ICD-10-CM

## 2016-04-07 ENCOUNTER — Emergency Department (HOSPITAL_COMMUNITY)
Admission: EM | Admit: 2016-04-07 | Discharge: 2016-04-07 | Disposition: A | Discharge status: Home / Self Care | Payer: Medicaid Other | Attending: Emergency Medicine | Admitting: Emergency Medicine

## 2016-04-07 ENCOUNTER — Encounter (HOSPITAL_COMMUNITY): Payer: Self-pay | Admitting: Emergency Medicine

## 2016-04-07 DIAGNOSIS — F17210 Nicotine dependence, cigarettes, uncomplicated: Secondary | ICD-10-CM | POA: Diagnosis not present

## 2016-04-07 DIAGNOSIS — K644 Residual hemorrhoidal skin tags: Secondary | ICD-10-CM | POA: Insufficient documentation

## 2016-04-07 DIAGNOSIS — G8929 Other chronic pain: Secondary | ICD-10-CM | POA: Diagnosis not present

## 2016-04-07 DIAGNOSIS — I10 Essential (primary) hypertension: Secondary | ICD-10-CM | POA: Diagnosis not present

## 2016-04-07 DIAGNOSIS — Z7982 Long term (current) use of aspirin: Secondary | ICD-10-CM | POA: Diagnosis not present

## 2016-04-07 DIAGNOSIS — J45909 Unspecified asthma, uncomplicated: Secondary | ICD-10-CM | POA: Diagnosis not present

## 2016-04-07 DIAGNOSIS — M545 Low back pain: Secondary | ICD-10-CM | POA: Diagnosis present

## 2016-04-07 DIAGNOSIS — E119 Type 2 diabetes mellitus without complications: Secondary | ICD-10-CM | POA: Diagnosis not present

## 2016-04-07 MED ORDER — LIDOCAINE HCL 2 % EX GEL
1.0000 "application " | Freq: Once | CUTANEOUS | Status: AC
  Administered 2016-04-07: 1
  Filled 2016-04-07: qty 11

## 2016-04-07 MED ORDER — METHOCARBAMOL 500 MG PO TABS: 1000.0000 mg | ORAL_TABLET | Freq: Four times a day (QID) | ORAL | Status: DC | PRN

## 2016-04-07 MED ORDER — LIDOCAINE VISCOUS 2 % MT SOLN: OROMUCOSAL | Status: DC

## 2016-04-07 MED ORDER — MORPHINE SULFATE (PF) 4 MG/ML IV SOLN
4.0000 mg | Freq: Once | INTRAVENOUS | Status: AC
  Administered 2016-04-07: 4 mg via INTRAMUSCULAR
  Filled 2016-04-07: qty 1

## 2016-04-07 NOTE — Discharge Instructions (Signed)

## 2016-04-07 NOTE — ED Provider Notes (Signed)
CSN: [id number]     Arrival date & time 04/07/16  1704 History   First MD Initiated Contact with Patient 04/07/16 1958     Chief Complaint  Patient presents with  . Rectal Pain  . Back Pain     (Consider location/radiation/quality/duration/timing/severity/associated sxs/prior Treatment) HPI   Blood pressure 134/78, pulse 101, temperature 98.3 F (36.8 C), temperature source Oral, resp. rate 20, last menstrual period 07/14/2013, SpO2 97 %.  ROSALVA NEWITT is a 50 y.o. female complaining of exacerbation of her chronic low back pain radiating down the left leg. She taken ibuprofen at home with little relief. She also reporting some hip pain. Patient ambulates with a cane at her baseline, she is able to ambulate normally. She denies fever, chills, incontinence, history of IV drug use, history of cancer. Patient is also had rectal bleeding over the last several months. States that she only has a blood when she wipes after bowel movement. She been told that she has both hemorrhoids and a fissure by gastroenterology, she was written a prescription for medication that required compounding and she couldn't afford it. Patient states that she put medicated foot powder on the anus is in front of a fan and it was helpful.  Past Medical History  Diagnosis Date  . Hypertension   . Asthma   . Bronchitis   . GERD (gastroesophageal reflux disease)   . Thyroid disease   . PTSD (post-traumatic stress disorder)   . Bipolar affect, depressed ([place])   . Chronic back pain   . Diabetes mellitus without complication (HCC)     DIET CONTROLLED  . DJD (degenerative joint disease)     BACK  . Scoliosis    Past Surgical History  Procedure Laterality Date  . Neck surgery  AS TEENAGER    STITCHES TO NECK   . Left ankle surgery  MARCH 2016  . 2 left toes surgery  MARCH 2016  . Surgery for cut on stomach  TEENAGER  . Esophagogastroduodenoscopy (egd) with propofol N/A 12/14/2015    Procedure:  ESOPHAGOGASTRODUODENOSCOPY (EGD) WITH PROPOFOL;  Surgeon: Teena Irani, MD;  Location: [facility];  Service: Endoscopy;  Laterality: N/A;  . Balloon dilation N/A 12/14/2015    Procedure: BALLOON DILATION;  Surgeon: Teena Irani, MD;  Location: [facility];  Service: Endoscopy;  Laterality: N/A;  . Colonoscopy with propofol N/A 12/14/2015    Procedure: COLONOSCOPY WITH PROPOFOL;  Surgeon: Teena Irani, MD;  Location: [facility];  Service: Endoscopy;  Laterality: N/A;   Family History  Problem Relation Age of Onset  . Hypertension Mother   . Hypertension Maternal Grandmother   . Hypertension Paternal Grandmother   . Diabetes Paternal Grandmother   . Hypertension Paternal Grandfather   . Diabetes Paternal Grandfather   . Alcohol abuse Paternal Aunt   . Mental illness Cousin    Social History  Substance Use Topics  . Smoking status: Current Every Day Smoker -- 0.25 packs/day    Types: Cigarettes  . Smokeless tobacco: Never Used  . Alcohol Use: No     Comment: quit date 01/17/2013   OB History    Gravida Para Term Preterm AB TAB SAB Ectopic Multiple Living   1 1 1       1      Review of Systems  10 systems reviewed and found to be negative, except as noted in the HPI.   Allergies  Crab; Buspirone; Lithium; and Other  Home Medications   Prior to Admission medications  Medication Sig Start Date End Date Taking? Authorizing Provider  albuterol (PROVENTIL HFA;VENTOLIN HFA) 108 (90 BASE) MCG/ACT inhaler Inhale 2 puffs into the lungs every 6 (six) hours as needed for wheezing.   Yes Historical Provider, MD  aspirin EC 81 MG tablet Take 81 mg by mouth every morning.    Yes Historical Provider, MD  divalproex (DEPAKOTE) 500 MG DR tablet Take 1 tablet (500 mg total) by mouth every 12 (twelve) hours. 09/12/15  Yes Benjamine Mola, FNP  docusate sodium (COLACE) 100 MG capsule Take 100 mg by mouth daily.   Yes Historical Provider, MD  furosemide (LASIX) 20 MG tablet Take 20 mg by mouth  daily.   Yes Historical Provider, MD  hydrOXYzine (ATARAX/VISTARIL) 25 MG tablet Take 1 tablet (25 mg total) by mouth every 6 (six) hours as needed for anxiety. 09/12/15  Yes Benjamine Mola, FNP  ibuprofen (ADVIL,MOTRIN) 800 MG tablet Take 1 tablet (800 mg total) by mouth 3 (three) times daily. 10/18/15  Yes Kayla Rose, PA-C  lisinopril (PRINIVIL,ZESTRIL) 10 MG tablet Take 1 tablet (10 mg total) by mouth daily. 09/12/15  Yes Benjamine Mola, FNP  POTASSIUM PO Take 1 tablet by mouth every other day.   Yes Historical Provider, MD  QUEtiapine (SEROQUEL) 400 MG tablet Take 400 mg by mouth at bedtime.   Yes Historical Provider, MD  tiotropium (SPIRIVA) 18 MCG inhalation capsule Place 18 mcg into inhaler and inhale daily.   Yes Historical Provider, MD  cyclobenzaprine (FLEXERIL) 5 MG tablet Take 1 tablet (5 mg total) by mouth 3 (three) times daily as needed for muscle spasms. Patient not taking: Reported on 04/07/2016 01/31/16   Billy Fischer, MD  diclofenac (CATAFLAM) 50 MG tablet Take 1 tablet (50 mg total) by mouth 3 (three) times daily. Patient not taking: Reported on 04/07/2016 01/31/16   Billy Fischer, MD  lidocaine (LIDODERM) 5 % Place 1 patch onto the skin daily. Remove & Discard patch within 12 hours or as directed by MD Patient not taking: Reported on 10/18/2015 09/12/15   Benjamine Mola, FNP  lidocaine (XYLOCAINE) 2 % solution 10 mLs per rectum every 4 hours when necessary pain 04/07/16   Elmyra Ricks Lakia Gritton, PA-C  methocarbamol (ROBAXIN) 500 MG tablet Take 2 tablets (1,000 mg total) by mouth 4 (four) times daily as needed (Pain). 04/07/16   Terrin Meddaugh, PA-C  pantoprazole (PROTONIX) 20 MG tablet Take 1 tablet (20 mg total) by mouth daily. Patient not taking: Reported on 04/07/2016 10/18/15   Gloriann Loan, PA-C  QUEtiapine (SEROQUEL) 300 MG tablet Take 1 tablet (300 mg total) by mouth at bedtime. Patient not taking: Reported on 04/07/2016 09/12/15   Benjamine Mola, FNP   BP 129/78 mmHg  Pulse 90   Temp(Src) 98.2 F (36.8 C) (Oral)  Resp 14  SpO2 100%  LMP 07/14/2013 Physical Exam  Constitutional: She appears well-developed and well-nourished.  HENT:  Head: Normocephalic.  Eyes: Conjunctivae are normal.  Neck: Normal range of motion.  Cardiovascular: Normal rate, regular rhythm and intact distal pulses.   Pulmonary/Chest: Effort normal and breath sounds normal.  Abdominal: Soft. Bowel sounds are normal. She exhibits no distension and no mass. There is no tenderness. There is no rebound and no guarding.  Genitourinary:  Digital rectal exam with internal and external hemorrhoids, normal rectal tone, normal stool color.  Neurological: She is alert.  No point tenderness to percussion of lumbar spinal processes.  No TTP or paraspinal muscular spasm. Strength is 5  out of 5 to bilateral lower extremities at hip and knee; extensor hallucis longus 5 out of 5. Ankle strength 5 out of 5, no clonus, neurovascularly intact. No saddle anaesthesia. Patellar reflexes are 2+ bilaterally.    Straight leg raise is positive on the left (ipsilateral) side and right side approximately 45   Psychiatric: She has a normal mood and affect.  Nursing note and vitals reviewed.   ED Course  Procedures (including critical care time) Labs Review Labs Reviewed - No data to display  Imaging Review No results found. I have personally reviewed and evaluated these images and lab results as part of my medical decision-making.   EKG Interpretation None      MDM   Final diagnoses:  Acute exacerbation of chronic low back pain  External hemorrhoids    Filed Vitals:   04/07/16 1717 04/07/16 2047  BP: 134/78 129/78  Pulse: 101 90  Temp: 98.3 F (36.8 C) 98.2 F (36.8 C)  TempSrc: Oral Oral  Resp: 20 14  SpO2: 97% 100%    Medications  morphine 4 MG/ML injection 4 mg (4 mg Intramuscular Given 04/07/16 2042)  lidocaine (XYLOCAINE) 2 % jelly 1 application (1 application Other Given 04/07/16 2050)     PERCY LUDWICK is 50 y.o. female presenting with Exacerbation of chronic back pain and also discomfort from anal fissure and hemorrhoids. No red flags for back pain. Patient reported improved with morphine. Patient has been seen by gastroenterology and advised her to check back in about the fissure. Will be written a prescription for viscous lidocaine for comfort.  Evaluation does not show pathology that would require ongoing emergent intervention or inpatient treatment. Pt is hemodynamically stable and mentating appropriately. Discussed findings and plan with patient/guardian, who agrees with care plan. All questions answered. Return precautions discussed and outpatient follow up given.   New Prescriptions   LIDOCAINE (XYLOCAINE) 2 % SOLUTION    10 mLs per rectum every 4 hours when necessary pain   METHOCARBAMOL (ROBAXIN) 500 MG TABLET    Take 2 tablets (1,000 mg total) by mouth 4 (four) times daily as needed (Pain).        Monico Blitz, PA-C 04/07/16 2106  Julianne Rice, MD 04/07/16 [phone]

## 2016-04-07 NOTE — ED Notes (Signed)
Pt presents today with complaints of chronic back pain. Pt has a history of back pain and of scoliosis. Pt also states she has been told she needs a hip replacement. Pt states her pain has made it difficult to sleep and she is unable to bend over.  Pt also has complaints of mild rectal bleeding. Pt states she has been told she has "hemhorroids and a fissure" Pt states she was given a rx for a "compound" that she was unable to afford, but she did try preparation H, which gave her some relief. Pt states she has small amounts of blood following bowel movements. This has been present for over one month. Pt states the pain is a burning pain. Pt states she has also been using vaseline on her rectum.

## 2016-04-18 DIAGNOSIS — M1612 Unilateral primary osteoarthritis, left hip: Secondary | ICD-10-CM | POA: Diagnosis present

## 2016-04-18 NOTE — H&P (Signed)
PREOPERATIVE H&P Patient ID: Alicia Blake MRN: [id number] DOB/AGE: 51-May-1967 51 y.o.  Chief Complaint: OA LEFT HIP  Planned Procedure Date: 05/14/16 Medical Clearance pending from PCP  Cardiac Clearance pending. Additional clearance by Psych: Elbert Ewings, NP   HPI: Alicia Blake is a 50 y.o. female with a History obesity, schizoaffective disorder - Bipolar type, Chronic back pain, GERD, DM2 diet controlled, bleeding hemorrhoids, and previous substance abuse who presents for evaluation of OA LEFT HIP. The patient has a history of pain and functional disability in the left hip due to arthritis w/ AVN and collapse and has failed non-surgical conservative treatments for greater than 12 weeks.  She has tried assistive devices, injections and anti-inflammatories.  Onset of symptoms was gradual, starting 6 years ago with gradually worsening course since that time.  Patient currently rates pain at 9 out of 10 with activity. Patient has worsening of pain with activity and weight bearing, pain that interferes with activities of daily living and pain with passive range of motion.  Patient has evidence of subchondral cysts, subchondral sclerosis, joint space narrowing, severe AVN and collapse with arthritis of her left hip by imaging studies. There is no active infection.  She has had symptoms of OSA in the past when she was heavier, but does not carry this diagnosis.  She notes occasional nonexertional chest pain in the past - she attributes this to GERD, but is not taking recommended medicine for same.  I recommended taking this also for gastroprotection w/ regular NSAID use.  We are still waiting on medical clearance.  She has not provided our office with requested medical history documentation - additional forms given today.  Also seen by GI for hemorroids/fissue with bleeding - otherwise negative colonoscopy, but still has bleeding and pain w/ BM.  She takes fiber, lidocaine, and stool softener for  same.  Past Medical History  Diagnosis Date  . Hypertension   . Asthma   . Bronchitis   . GERD (gastroesophageal reflux disease)   . Thyroid disease   . PTSD (post-traumatic stress disorder)   . Bipolar affect, depressed ([place])   . Chronic back pain   . Diabetes mellitus without complication (HCC)     DIET CONTROLLED  . DJD (degenerative joint disease)     BACK  . Scoliosis    Past Surgical History  Procedure Laterality Date  . Neck surgery  AS TEENAGER    STITCHES TO NECK   . Left ankle surgery  MARCH 2016  . 2 left toes surgery  MARCH 2016  . Surgery for cut on stomach  TEENAGER  . Esophagogastroduodenoscopy (egd) with propofol N/A 12/14/2015    Procedure: ESOPHAGOGASTRODUODENOSCOPY (EGD) WITH PROPOFOL;  Surgeon: Teena Irani, MD;  Location: [facility];  Service: Endoscopy;  Laterality: N/A;  . Balloon dilation N/A 12/14/2015    Procedure: BALLOON DILATION;  Surgeon: Teena Irani, MD;  Location: [facility];  Service: Endoscopy;  Laterality: N/A;  . Colonoscopy with propofol N/A 12/14/2015    Procedure: COLONOSCOPY WITH PROPOFOL;  Surgeon: Teena Irani, MD;  Location: [facility];  Service: Endoscopy;  Laterality: N/A;   Allergies  Allergen Reactions  . Crab [Shellfish Allergy] Swelling    fever fever  . Buspirone Swelling  . Lithium Other (See Comments)    Other reaction(s): Other - See Comments Speech impairment/pt states that she is not sure if she had an reactions Speech impairment Pt denies allergy  . Other Hives and Itching    crablegs only  Prior to Admission medications   Medication Sig Start Date End Date Taking? Authorizing Provider  albuterol (PROVENTIL HFA;VENTOLIN HFA) 108 (90 BASE) MCG/ACT inhaler Inhale 2 puffs into the lungs every 6 (six) hours as needed for wheezing.    Historical Provider, MD  aspirin EC 81 MG tablet Take 81 mg by mouth every morning.     Historical Provider, MD  cyclobenzaprine (FLEXERIL) 5 MG tablet Take 1 tablet (5 mg total) by  mouth 3 (three) times daily as needed for muscle spasms. Patient not taking: Reported on 04/07/2016 01/31/16   Billy Fischer, MD  diclofenac (CATAFLAM) 50 MG tablet Take 1 tablet (50 mg total) by mouth 3 (three) times daily. Patient not taking: Reported on 04/07/2016 01/31/16   Billy Fischer, MD  divalproex (DEPAKOTE) 500 MG DR tablet Take 1 tablet (500 mg total) by mouth every 12 (twelve) hours. 09/12/15   Benjamine Mola, FNP  docusate sodium (COLACE) 100 MG capsule Take 100 mg by mouth daily.    Historical Provider, MD  furosemide (LASIX) 20 MG tablet Take 20 mg by mouth daily.    Historical Provider, MD  hydrOXYzine (ATARAX/VISTARIL) 25 MG tablet Take 1 tablet (25 mg total) by mouth every 6 (six) hours as needed for anxiety. 09/12/15   Benjamine Mola, FNP  ibuprofen (ADVIL,MOTRIN) 800 MG tablet Take 1 tablet (800 mg total) by mouth 3 (three) times daily. 10/18/15   Gloriann Loan, PA-C  lidocaine (LIDODERM) 5 % Place 1 patch onto the skin daily. Remove & Discard patch within 12 hours or as directed by MD Patient not taking: Reported on 10/18/2015 09/12/15   Benjamine Mola, FNP  lidocaine (XYLOCAINE) 2 % solution 10 mLs per rectum every 4 hours when necessary pain 04/07/16   Elmyra Ricks Pisciotta, PA-C  lisinopril (PRINIVIL,ZESTRIL) 10 MG tablet Take 1 tablet (10 mg total) by mouth daily. 09/12/15   Benjamine Mola, FNP  methocarbamol (ROBAXIN) 500 MG tablet Take 2 tablets (1,000 mg total) by mouth 4 (four) times daily as needed (Pain). 04/07/16   Nicole Pisciotta, PA-C  pantoprazole (PROTONIX) 20 MG tablet Take 1 tablet (20 mg total) by mouth daily. Patient not taking: Reported on 04/07/2016 10/18/15   Gloriann Loan, PA-C  POTASSIUM PO Take 1 tablet by mouth every other day.    Historical Provider, MD  QUEtiapine (SEROQUEL) 300 MG tablet Take 1 tablet (300 mg total) by mouth at bedtime. Patient not taking: Reported on 04/07/2016 09/12/15   Benjamine Mola, FNP  QUEtiapine (SEROQUEL) 400 MG tablet Take 400 mg by mouth  at bedtime.    Historical Provider, MD  tiotropium (SPIRIVA) 18 MCG inhalation capsule Place 18 mcg into inhaler and inhale daily.    Historical Provider, MD   Social History   Social History  . Marital Status: Single    Spouse Name: N/A  . Number of Children: N/A  . Years of Education: N/A   Social History Main Topics  . Smoking status: Current Every Day Smoker -- 0.25 packs/day    Types: Cigarettes  . Smokeless tobacco: Never Used  . Alcohol Use: No     Comment: quit date 01/17/2013  . Drug Use: Yes    Special: "Crack" cocaine     Comment: quit date 04-17-2014 FOR CRACK  . Sexual Activity: Yes    Birth Control/ Protection: None   Other Topics Concern  . Not on file   Social History Narrative   Family History  Problem Relation Age of  Onset  . Hypertension Mother   . Hypertension Maternal Grandmother   . Hypertension Paternal Grandmother   . Diabetes Paternal Grandmother   . Hypertension Paternal Grandfather   . Diabetes Paternal Grandfather   . Alcohol abuse Paternal Aunt   . Mental illness Cousin     ROS: Currently denies lightheadedness, dizziness, Fever, chills.  Continued difficulty with hemorrhoids, pain and bleeding with BM. No personal history of DVT, PE, MI, or CVA. No loose teeth or dentures. All other systems have been reviewed and were otherwise negative with the exception of those mentioned in the HPI and as above.  Objective: Vitals: Ht: 5'6 Wt: 246 Temp: 97.9 BP: 129/82 Pulse: 96 O2 99% on room air. Physical Exam: General: Alert, NAD.  Trendelenberg Gait. Cane in right hand. HEENT: EOMI, Good Neck Extension  Pulm: No increased work of breathing.  Few diffuse coarse sounds cleared with coughing, otherwise clear B/L A/P w/o crackle or wheeze. CV: RRR, No m/g/r appreciated  GI: soft, NT, ND Neuro: Neuro grossly intact b/l upper/lower ext.  Sensation intact distally Skin: No lesions in the area of chief complaint MSK/Surgical Site: Non tender over  greater trochanter.  Pain with passive ROM.  Positive Stinchfield.  5/5 strength.  NVI.  Sensation intact distally.  Imaging Review Plain radiographs demonstrate severe AVN and collapse,  severe degenerative joint disease of the left hip.   Assessment: OA LEFT HIP Principal Problem:   Primary osteoarthritis of left hip Active Problems:   Symptomatic Fibroids   Schizoaffective disorder, bipolar type (HCC)   Chronic pain syndrome   Alcohol use disorder, moderate, in sustained remission (HCC)   Cocaine use disorder, moderate, in sustained remission   Cannabis use disorder, moderate, in sustained remission   DDD (degenerative disc disease), lumbar   Hx of fracture of ankle   Opioid use disorder, mild, abuse   Tobacco abuse    Plan: We will obtain Cardiac clearance for atypical chest pain. Medical clearance still pending.  Plan for Procedure(s): TOTAL HIP ARTHROPLASTY ANTERIOR APPROACH  The patient history, physical exam, clinical judgement of the provider and imaging are consistent with end stage degenerative joint disease and total joint arthroplasty is deemed medically necessary. The treatment options including medical management, injection therapy, and arthroplasty were discussed at length. The risks and benefits of Procedure(s): TOTAL HIP ARTHROPLASTY ANTERIOR APPROACH were presented and reviewed.  The risks of nonoperative treatment, versus surgical intervention including but not limited to continued pain, aseptic loosening, stiffness, dislocation/subluxation, infection, bleeding, nerve injury, blood clots, cardiopulmonary complications, morbidity, mortality, among others were discussed. The patient verbalizes understanding and wishes to proceed with the plan.  Patient is being admitted for inpatient treatment for surgery, pain control, PT, OT, prophylactic antibiotics, VTE prophylaxis, progressive ambulation, ADL's and discharge planning.   Dental prophylaxis discussed and  recommended for 2 years postoperatively.  The patient does meet the criteria for TXA which will be used perioperatively via IV.   Aspirin will be used postoperatively for DVT prophylaxis in addition to SCDs, and early ambulation. The patient is planning to be discharged either to SNF, or w/ HHPT/OT/SW depending on Post op progress.  Lives alone on the 2nd story.   Prudencio Burly III, PA-C 04/19/2016 2:18 PM

## 2016-04-19 DIAGNOSIS — Z720 Tobacco use: Secondary | ICD-10-CM | POA: Diagnosis present

## 2016-04-22 ENCOUNTER — Encounter: Payer: Self-pay | Admitting: Cardiovascular Disease

## 2016-04-22 ENCOUNTER — Ambulatory Visit (INDEPENDENT_AMBULATORY_CARE_PROVIDER_SITE_OTHER): Payer: Medicaid Other | Admitting: Cardiovascular Disease

## 2016-04-22 VITALS — BP 118/74 | HR 94 | Ht 66.0 in | Wt 244.4 lb

## 2016-04-22 DIAGNOSIS — R072 Precordial pain: Secondary | ICD-10-CM

## 2016-04-22 DIAGNOSIS — R0600 Dyspnea, unspecified: Secondary | ICD-10-CM

## 2016-04-22 NOTE — Patient Instructions (Signed)
Medication Instructions:  Your physician recommends that you continue on your current medications as directed. Please refer to the Current Medication list given to you today.   Labwork: none  Testing/Procedures: Your physician has requested that you have an echocardiogram. Echocardiography is a painless test that uses sound waves to create images of your heart. It provides your doctor with information about the size and shape of your heart and how well your heart's chambers and valves are working. This procedure takes approximately one hour. There are no restrictions for this procedure.  Your physician has requested that you have a lexiscan myoview. For further information please visit [email or web address]. Please follow instruction sheet, as given.    Follow-Up: To be determined based on results of tests.   Any Other Special Instructions Will Be Listed Below (If Applicable).     If you need a refill on your cardiac medications before your next appointment, please call your pharmacy.

## 2016-04-22 NOTE — Addendum Note (Signed)
Addended by: Alvina Filbert B on: 04/22/2016 10:31 AM   Modules accepted: Orders

## 2016-04-22 NOTE — Progress Notes (Signed)
Chief Complaint  Patient presents with  . Chest Pain     History of Present Illness: 50 yo female with history of HTN, asthma, GERD, PTSD, Bipolar disorder, chronic back pain, borderline DM, former polysubstance abuse, ongoing tobacco abuse who is referred today as a new patient for evaluation of chest pain. She was being evaluated last week by Dr. Fredonia Highland for left hip pain and there is ongoing planning for left hip surgery. She described chest pain and was referred to our office. She tells me today that she has no known cardiac disease. She has used crack cocaine in the past. She denies using any in the last 2 years. She is a current every day smoker, currently smoking 1/2 ppd. She has chronic back pain. She has had prior EGD with esophageal stricture and esophageal dilatation. She has pain after meals. It feels like a cramping in her left chest. This has been going on for months. She endorses yes to every question I ask today. It seems that there can be an exertional component to her chest pain but she is not active. She also endorses frequent nausea, diaphoresis, dyspnea, "nerve issues", dizziness. No syncope.    Primary Care Physician: Elbert Ewings, FNP   Past Medical History  Diagnosis Date  . Hypertension   . Asthma   . Bronchitis   . GERD (gastroesophageal reflux disease)   . Thyroid disease   . PTSD (post-traumatic stress disorder)   . Bipolar affect, depressed ([place])   . Chronic back pain   . Diabetes mellitus without complication (HCC)     DIET CONTROLLED  . DJD (degenerative joint disease)     BACK  . Scoliosis     Past Surgical History  Procedure Laterality Date  . Neck surgery  AS TEENAGER    STITCHES TO NECK   . Left ankle surgery  MARCH 2016  . 2 left toes surgery  MARCH 2016  . Surgery for cut on stomach  TEENAGER  . Esophagogastroduodenoscopy (egd) with propofol N/A 12/14/2015    Procedure: ESOPHAGOGASTRODUODENOSCOPY (EGD) WITH PROPOFOL;  Surgeon: Teena Irani, MD;  Location: [facility];  Service: Endoscopy;  Laterality: N/A;  . Balloon dilation N/A 12/14/2015    Procedure: BALLOON DILATION;  Surgeon: Teena Irani, MD;  Location: [facility];  Service: Endoscopy;  Laterality: N/A;  . Colonoscopy with propofol N/A 12/14/2015    Procedure: COLONOSCOPY WITH PROPOFOL;  Surgeon: Teena Irani, MD;  Location: [facility];  Service: Endoscopy;  Laterality: N/A;    Current Outpatient Prescriptions  Medication Sig Dispense Refill  . albuterol (PROVENTIL HFA;VENTOLIN HFA) 108 (90 BASE) MCG/ACT inhaler Inhale 2 puffs into the lungs every 6 (six) hours as needed for wheezing.    Marland Kitchen aspirin EC 81 MG tablet Take 81 mg by mouth every morning.     . divalproex (DEPAKOTE) 500 MG DR tablet Take 1 tablet (500 mg total) by mouth every 12 (twelve) hours. 60 tablet 0  . docusate sodium (COLACE) 100 MG capsule Take 100 mg by mouth daily.    . furosemide (LASIX) 20 MG tablet Take 20 mg by mouth daily.    . hydrOXYzine (ATARAX/VISTARIL) 25 MG tablet Take 1 tablet (25 mg total) by mouth every 6 (six) hours as needed for anxiety. 30 tablet 0  . ibuprofen (ADVIL,MOTRIN) 800 MG tablet Take 1 tablet (800 mg total) by mouth 3 (three) times daily. 21 tablet 0  . lisinopril (PRINIVIL,ZESTRIL) 10 MG tablet Take 1 tablet (10 mg total) by  mouth daily.    . methocarbamol (ROBAXIN) 500 MG tablet Take 2 tablets (1,000 mg total) by mouth 4 (four) times daily as needed (Pain). 20 tablet 0  . POTASSIUM PO Take 1 tablet by mouth every other day.    Marland Kitchen QUEtiapine (SEROQUEL) 300 MG tablet Take 1 tablet (300 mg total) by mouth at bedtime. 30 tablet 0  . tiotropium (SPIRIVA) 18 MCG inhalation capsule Place 18 mcg into inhaler and inhale daily.     No current facility-administered medications for this visit.    Allergies  Allergen Reactions  . Crab [Shellfish Allergy] Swelling    fever fever  . Buspirone Swelling  . Gabapentin Nausea And Vomiting  . Lithium Other (See Comments)     Other reaction(s): Other - See Comments Speech impairment/pt states that she is not sure if she had an reactions Speech impairment Pt denies allergy  . Other Hives and Itching    crablegs only     Social History   Social History  . Marital Status: Single    Spouse Name: N/A  . Number of Children: N/A  . Years of Education: N/A   Occupational History  . Not on file.   Social History Main Topics  . Smoking status: Current Every Day Smoker -- 0.25 packs/day    Types: Cigarettes  . Smokeless tobacco: Never Used  . Alcohol Use: No     Comment: quit date 01/17/2013  . Drug Use: Yes    Special: "Crack" cocaine     Comment: quit date 04-17-2014 FOR CRACK  . Sexual Activity: Yes    Birth Control/ Protection: None   Other Topics Concern  . Not on file   Social History Narrative    Family History  Problem Relation Age of Onset  . Hypertension Mother   . Hypertension Maternal Grandmother   . Hypertension Paternal Grandmother   . Diabetes Paternal Grandmother   . Hypertension Paternal Grandfather   . Diabetes Paternal Grandfather   . Alcohol abuse Paternal Aunt   . Mental illness Cousin   . Heart attack Cousin   . Heart attack Maternal Uncle     Review of Systems:  As stated in the HPI and otherwise negative.   BP 118/74 mmHg  Pulse 94  Ht 5\' 6"  (1.676 m)  Wt 244 lb 6.4 oz (110.859 kg)  BMI 39.47 kg/m2  LMP 07/14/2013  Physical Examination: General: Well developed, well nourished, NAD HEENT: OP clear, mucus membranes moist SKIN: warm, dry. No rashes. Neuro: No focal deficits Musculoskeletal: Muscle strength 5/5 all ext Psychiatric: Mood and affect normal Neck: No JVD, no carotid bruits, no thyromegaly, no lymphadenopathy. Lungs:Clear bilaterally, no wheezes, rhonci, crackles Cardiovascular: Regular rate and rhythm. No murmurs, gallops or rubs. Abdomen:Soft. Bowel sounds present. Non-tender.  Extremities: No lower extremity edema. Pulses are 2 + in the  bilateral DP/PT.  EKG:  EKG is ordered today. The ekg ordered today demonstrates NSR, rate 94 bpm.   Recent Labs: 09/06/2015: ALT 13* 09/09/2015: TSH 2.293 10/18/2015: BUN 15; Creatinine, Ser 1.00; Hemoglobin 11.8*; Platelets 195; Potassium 4.3; Sodium 143   Lipid Panel    Component Value Date/Time   CHOL 145 09/09/2015 0640   TRIG 64 09/09/2015 0640   HDL 48 09/09/2015 0640   CHOLHDL 3.0 09/09/2015 0640   VLDL 13 09/09/2015 0640   LDLCALC 84 09/09/2015 0640     Wt Readings from Last 3 Encounters:  04/22/16 244 lb 6.4 oz (110.859 kg)  12/14/15 243 lb (110.224  kg)  09/06/15 249 lb (112.946 kg)     Other studies Reviewed: Additional studies/ records that were reviewed today include: . Review of the above records demonstrates:    Assessment and Plan:   1. Chest pain: She has left sided chest pain. This occurs mostly after meals but she is a very poor historian and does endorse an exertional component to the chest pain with associated dyspnea, diaphoresis and nausea. She has many chronic pain issues. I think it is most likely that her pain is GI related as she has known GERD and prior esophageal stricture requiring dilatation. Given her risk factors for CAD including DM, obesity, tobacco abuse and HTN with reported FH of CAD, I will arrange a Lexiscan nuclear stress test to exclude ischemia. She cannot walk on a treadmill due to hip pain. I will also arrange an echo given dyspnea to exclude structural heart disease, assess LVEF.   Current medicines are reviewed at length with the patient today.  The patient does not have concerns regarding medicines.  The following changes have been made:  no change  Labs/ tests ordered today include:   Orders Placed This Encounter  Procedures  . Myocardial Perfusion Imaging  . EKG 12-Lead  . ECHO COMPLETE     Disposition:   FU with me after testing.   Signed, Lauree Chandler, MD 04/22/2016 10:21 AM    [facility] [address], [place], [place]  [address] Phone: [phone]; Fax: [phone]

## 2016-04-25 ENCOUNTER — Ambulatory Visit (HOSPITAL_COMMUNITY)
Admission: RE | Admit: 2016-04-25 | Discharge: 2016-04-25 | Disposition: A | Discharge status: Home / Self Care | Payer: Medicaid Other | Source: Ambulatory Visit | Attending: Cardiovascular Disease | Admitting: Cardiovascular Disease

## 2016-04-25 ENCOUNTER — Other Ambulatory Visit (HOSPITAL_COMMUNITY): Payer: Self-pay

## 2016-04-25 ENCOUNTER — Encounter (HOSPITAL_COMMUNITY)
Admission: RE | Admit: 2016-04-25 | Discharge: 2016-04-25 | Disposition: A | Discharge status: Home / Self Care | Payer: Medicaid Other | Source: Ambulatory Visit | Attending: Cardiovascular Disease | Admitting: Cardiovascular Disease

## 2016-04-25 ENCOUNTER — Encounter (HOSPITAL_BASED_OUTPATIENT_CLINIC_OR_DEPARTMENT_OTHER)
Admission: RE | Admit: 2016-04-25 | Discharge: 2016-04-25 | Disposition: A | Discharge status: Home / Self Care | Payer: Medicaid Other | Source: Ambulatory Visit | Attending: Cardiovascular Disease | Admitting: Cardiovascular Disease

## 2016-04-25 ENCOUNTER — Ambulatory Visit: Admit: 2016-04-25 | Payer: Self-pay | Admitting: Orthopedic Surgery

## 2016-04-25 DIAGNOSIS — Z720 Tobacco use: Secondary | ICD-10-CM | POA: Insufficient documentation

## 2016-04-25 DIAGNOSIS — R072 Precordial pain: Secondary | ICD-10-CM | POA: Insufficient documentation

## 2016-04-25 DIAGNOSIS — I313 Pericardial effusion (noninflammatory): Secondary | ICD-10-CM | POA: Diagnosis not present

## 2016-04-25 DIAGNOSIS — R0600 Dyspnea, unspecified: Secondary | ICD-10-CM | POA: Insufficient documentation

## 2016-04-25 LAB — ECHOCARDIOGRAM COMPLETE
CHL CUP MV DEC (S): 246
E/e' ratio: 10.46
EWDT: 246 ms
FS: 36 % (ref 28–44)
IV/PV OW: 1.14
LA ID, A-P, ES: 33 mm
LA vol: 42.5 mL
LADIAMINDEX: 1.51 cm/m2
LAVOLA4C: 40.3 mL
LAVOLIN: 19.5 mL/m2
LEFT ATRIUM END SYS DIAM: 33 mm
LV PW d: 9.48 mm — AB (ref 0.6–1.1)
LVEEAVG: 10.46
LVEEMED: 10.46
LVELAT: 10.8 cm/s
LVOT area: 2.84 cm2
LVOT diameter: 19 mm
MV Peak grad: 5 mmHg
MV pk E vel: 113 m/s
MVPKAVEL: 114 m/s
TAPSE: 15.2 mm
TDI e' lateral: 10.8
TDI e' medial: 8.7

## 2016-04-25 LAB — NM MYOCAR MULTI W/SPECT W/WALL MOTION / EF
CHL CUP MPHR: 170 {beats}/min
CHL CUP NUCLEAR SDS: 6
CHL CUP NUCLEAR SRS: 3
CHL CUP NUCLEAR SSS: 9
CHL CUP RESTING HR STRESS: 86 {beats}/min
CHL CUP STRESS STAGE 1 HR: 88 {beats}/min
CHL CUP STRESS STAGE 2 HR: 88 {beats}/min
CHL CUP STRESS STAGE 2 SPEED: 0 mph
CHL CUP STRESS STAGE 3 GRADE: 0 %
CHL CUP STRESS STAGE 3 SBP: 113 mmHg
CHL CUP STRESS STAGE 3 SPEED: 0 mph
CHL CUP STRESS STAGE 4 GRADE: 0 %
CHL CUP STRESS STAGE 4 HR: 95 {beats}/min
CHL CUP STRESS STAGE 4 SBP: 111 mmHg
CSEPHR: 58 %
Exercise duration (min): 0 min
Exercise duration (sec): 0 s
LHR: 0.23
LV sys vol: 14 mL
LVDIAVOL: 48 mL (ref 46–106)
Stage 1 Grade: 0 %
Stage 1 Speed: 0 mph
Stage 2 Grade: 0 %
Stage 3 DBP: 77 mmHg
Stage 3 HR: 96 {beats}/min
Stage 4 DBP: 79 mmHg
Stage 4 Speed: 0 mph
TID: 1

## 2016-04-25 SURGERY — ARTHROPLASTY, KNEE, UNICOMPARTMENTAL
Anesthesia: General | Laterality: Left

## 2016-04-25 MED ORDER — REGADENOSON 0.4 MG/5ML IV SOLN
0.4000 mg | Freq: Once | INTRAVENOUS | Status: AC
  Administered 2016-04-25: 0.4 mg via INTRAVENOUS

## 2016-04-25 MED ORDER — TECHNETIUM TC 99M TETROFOSMIN IV KIT
30.0000 | PACK | Freq: Once | INTRAVENOUS | Status: AC | PRN
  Administered 2016-04-25: 30 via INTRAVENOUS

## 2016-04-25 MED ORDER — REGADENOSON 0.4 MG/5ML IV SOLN
INTRAVENOUS | Status: AC
  Filled 2016-04-25: qty 5

## 2016-04-25 MED ORDER — TECHNETIUM TC 99M TETROFOSMIN IV KIT
10.0000 | PACK | Freq: Once | INTRAVENOUS | Status: AC | PRN
  Administered 2016-04-25: 10 via INTRAVENOUS

## 2016-04-25 NOTE — Progress Notes (Signed)
*  PRELIMINARY RESULTS* Echocardiogram 2D Echocardiogram has been performed.  Leavy Cella 04/25/2016, 4:08 PM

## 2016-04-26 NOTE — Progress Notes (Signed)
Pt.notified

## 2016-05-02 NOTE — Pre-Procedure Instructions (Signed)
    [name]  05/02/2016      [facility] - [place] ([place] [place] [id number] [place] ([place]) [place] [address] Phone: [phone] Fax: [phone]    Your procedure is scheduled on 05/14/16.  Report to [facility] Admitting at 8 A.M.  Call this number if you have problems the morning of surgery:  [phone]   Remember:  Do not eat food or drink liquids after midnight.  Take these medicines the morning of surgery with A SIP OF WATER --all inhalers,depakote,robaxin Do not take any aspirin,anti-inflammatories,vitamins,or herbal supplements 5-7 days prior to surgery.  Do not wear jewelry, make-up or nail polish.  Do not wear lotions, powders, or perfumes.  You may wear deoderant.  Do not shave 48 hours prior to surgery.  Men may shave face and neck.  Do not bring valuables to the hospital.  [facility] is not responsible for any belongings or valuables.  Contacts, dentures or bridgework may not be worn into surgery.  Leave your suitcase in the car.  After surgery it may be brought to your room.  For patients admitted to the hospital, discharge time will be determined by your treatment team.  Patients discharged the day of surgery will not be allowed to drive home Name and phone number of your driver:   Special instructions:   Please read over the following fact sheets that you were given. MRSA Information

## 2016-05-03 ENCOUNTER — Inpatient Hospital Stay (HOSPITAL_COMMUNITY)
Admission: RE | Admit: 2016-05-03 | Discharge: 2016-05-03 | Disposition: A | Discharge status: Home / Self Care | Payer: Self-pay | Source: Ambulatory Visit

## 2016-05-07 ENCOUNTER — Encounter (HOSPITAL_COMMUNITY)
Admission: RE | Admit: 2016-05-07 | Discharge: 2016-05-07 | Disposition: A | Discharge status: Home / Self Care | Payer: Medicaid Other | Source: Ambulatory Visit | Attending: Orthopedic Surgery | Admitting: Orthopedic Surgery

## 2016-05-07 ENCOUNTER — Encounter (HOSPITAL_COMMUNITY): Payer: Self-pay

## 2016-05-07 DIAGNOSIS — M1612 Unilateral primary osteoarthritis, left hip: Secondary | ICD-10-CM | POA: Diagnosis not present

## 2016-05-07 DIAGNOSIS — Z01812 Encounter for preprocedural laboratory examination: Secondary | ICD-10-CM | POA: Diagnosis not present

## 2016-05-07 HISTORY — DX: Chronic obstructive pulmonary disease, unspecified: J44.9

## 2016-05-07 HISTORY — DX: Reserved for inherently not codable concepts without codable children: IMO0001

## 2016-05-07 HISTORY — DX: Prediabetes: R73.03

## 2016-05-07 HISTORY — DX: Urinary tract infection, site not specified: N39.0

## 2016-05-07 HISTORY — DX: Unspecified convulsions: R56.9

## 2016-05-07 LAB — COMPREHENSIVE METABOLIC PANEL
ALBUMIN: 3.3 g/dL — AB (ref 3.5–5.0)
ALK PHOS: 57 U/L (ref 38–126)
ALT: 14 U/L (ref 14–54)
AST: 15 U/L (ref 15–41)
Anion gap: 7 (ref 5–15)
BILIRUBIN TOTAL: 0.6 mg/dL (ref 0.3–1.2)
BUN: 11 mg/dL (ref 6–20)
CALCIUM: 9.1 mg/dL (ref 8.9–10.3)
CO2: 29 mmol/L (ref 22–32)
CREATININE: 1 mg/dL (ref 0.44–1.00)
Chloride: 103 mmol/L (ref 101–111)
GFR calc Af Amer: >60 mL/min (ref >60)
GFR calc non Af Amer: >60 mL/min (ref >60)
GLUCOSE: 107 mg/dL — AB (ref 65–99)
POTASSIUM: 3.5 mmol/L (ref 3.5–5.1)
Sodium: 139 mmol/L (ref 135–145)
TOTAL PROTEIN: 8 g/dL (ref 6.5–8.1)

## 2016-05-07 LAB — TYPE AND SCREEN
ABO/RH(D): O POS
ANTIBODY SCREEN: NEGATIVE

## 2016-05-07 LAB — CBC
HEMATOCRIT: 35 % — AB (ref 36.0–46.0)
HEMOGLOBIN: 11.6 g/dL — AB (ref 12.0–15.0)
MCH: 30.9 pg (ref 26.0–34.0)
MCHC: 33.1 g/dL (ref 30.0–36.0)
MCV: 93.3 fL (ref 78.0–100.0)
Platelets: 224 10*3/uL (ref 150–400)
RBC: 3.75 MIL/uL — AB (ref 3.87–5.11)
RDW: 16.2 % — ABNORMAL HIGH (ref 11.5–15.5)
WBC: 6.7 10*3/uL (ref 4.0–10.5)

## 2016-05-07 LAB — URINALYSIS, ROUTINE W REFLEX MICROSCOPIC
BILIRUBIN URINE: NEGATIVE
GLUCOSE, UA: NEGATIVE mg/dL
HGB URINE DIPSTICK: NEGATIVE
Ketones, ur: NEGATIVE mg/dL
Leukocytes, UA: NEGATIVE
NITRITE: NEGATIVE
Protein, ur: NEGATIVE mg/dL
SPECIFIC GRAVITY, URINE: 1.016 (ref 1.005–1.030)
pH: 5.5 (ref 5.0–8.0)

## 2016-05-07 LAB — GLUCOSE, CAPILLARY: Glucose-Capillary: 97 mg/dL (ref 65–99)

## 2016-05-07 LAB — HCG, SERUM, QUALITATIVE: PREG SERUM: NEGATIVE

## 2016-05-07 LAB — ABO/RH: ABO/RH(D): O POS

## 2016-05-07 LAB — SURGICAL PCR SCREEN
MRSA, PCR: NEGATIVE
STAPHYLOCOCCUS AUREUS: NEGATIVE

## 2016-05-07 LAB — HEMOGLOBIN A1C
HEMOGLOBIN A1C: 5.4 % (ref 4.8–5.6)
MEAN PLASMA GLUCOSE: 108 mg/dL

## 2016-05-07 LAB — PROTIME-INR
INR: 1.01 (ref 0.00–1.49)
Prothrombin Time: 13.5 seconds (ref 11.6–15.2)

## 2016-05-07 LAB — APTT: APTT: 28 s (ref 24–37)

## 2016-05-07 NOTE — Pre-Procedure Instructions (Addendum)
Alicia Blake  05/07/2016      [facility] - [place] ([place]), [place] - [id number] [place]) [place] [address] Phone: [phone] Fax: [phone]  [place], [place], [place] [address] Phone: [phone] Fax: [phone]  [place] [address] Phone: [phone] Fax: [phone]    Your procedure is scheduled on 05/14/16.  Report to [facility] Admitting at 8 A.M.  Call this number if you have problems the morning of surgery:  [phone]   Remember:  Do not eat food or drink liquids after midnight.  Take these medicines the morning of surgery with A SIP OF WATER --all inhalers(bring albuterol),depakote,  Do not take any aspirin,anti-inflammatories,vitamins,or herbal supplements 5-7 days prior to surgery. Starting 05/09/16   Do not wear jewelry, make-up or nail polish.  Do not wear lotions, powders, or perfumes.  You may wear deoderant.  Do not shave 48 hours prior to surgery.  Men may shave face and neck.  Do not bring valuables to the hospital.  [facility] is not responsible for any belongings or valuables.  Contacts, dentures or bridgework may not be worn into surgery.  Leave your suitcase in the car.  After surgery it may be brought to your room.  For patients admitted to the hospital, discharge time will be determined by your treatment team.  Patients discharged the day of surgery will not be allowed to drive home Name and phone number of your driver:   Special instructions:  Special Instructions: [place] - Preparing for Surgery  Before surgery, you can play an important role.  Because skin is not sterile, your skin needs to be as free of germs as possible.  You can reduce the number of germs on you skin by washing with CHG (chlorahexidine gluconate) soap before surgery.   CHG is an antiseptic cleaner which kills germs and bonds with the skin to continue killing germs even after washing.  Please DO NOT use if you have an allergy to CHG or antibacterial soaps.  If your skin becomes reddened/irritated stop using the CHG and inform your nurse when you arrive at Short Stay.  Do not shave (including legs and underarms) for at least 48 hours prior to the first CHG shower.  You may shave your face.  Please follow these instructions carefully:   1.  Shower with CHG Soap the night before surgery and the morning of Surgery.  2.  If you choose to wash your hair, wash your hair first as usual with your normal shampoo.  3.  After you shampoo, rinse your hair and body thoroughly to remove the Shampoo.  4.  Use CHG as you would any other liquid soap.  You can apply chg directly  to the skin and wash gently with scrungie or a clean washcloth.  5.  Apply the CHG Soap to your body ONLY FROM THE NECK DOWN.  Do not use on open wounds or open sores.  Avoid contact with your eyes ears, mouth and genitals (private parts).  Wash genitals (private parts)       with your normal soap.  6.  Wash thoroughly, paying special attention to the area where your surgery will be performed.  7.  Thoroughly rinse your body with warm water from the neck down.  8.  DO NOT shower/wash  with your normal soap after using and rinsing off the CHG Soap.  9.  Pat yourself dry with a clean towel.            10.  Wear clean pajamas.            11.  Place clean sheets on your bed the night of your first shower and do not sleep with pets.  Day of Surgery  Do not apply any lotions/deodorants the morning of surgery.  Please wear clean clothes to the hospital/surgery center.  Please read over the following fact sheets that you were given. MRSA Information

## 2016-05-07 NOTE — Progress Notes (Signed)
   05/07/16 1054  OBSTRUCTIVE SLEEP APNEA  Have you ever been diagnosed with sleep apnea through a sleep study? No  Do you snore loudly (loud enough to be heard through closed doors)?  1  Do you often feel tired, fatigued, or sleepy during the daytime (such as falling asleep during driving or talking to someone)? 1  Has anyone observed you stop breathing during your sleep? 1  Do you have, or are you being treated for high blood pressure? 1  BMI more than 35 kg/m2? 1  Age > 50 (1-yes) 1  Neck circumference greater than:Female 16 inches or larger, Female 17inches or larger? 1 (23)  Female Gender (Yes=1) 0  Obstructive Sleep Apnea Score 7  Score 5 or greater  Results sent to PCP

## 2016-05-07 NOTE — Progress Notes (Addendum)
Called kelly to check with dr t Percell Miller on [place] for permit-  ?arthroplasty instead of arthroscopy Cardiac clearance on chart

## 2016-05-08 NOTE — Progress Notes (Signed)
Anesthesia Chart Review:  Pt is a 50 year old female scheduled for L total hip arthroplasty on 05/14/2016 with Edmonia Lynch, MD.   PMH includes:  HTN, DM (diet controlled), asthma thyroid disease, PTSD, scoliosis, COPD, seizures (with alcohol years ago), GERD. Current smoker. BMI 39  Medications include: albuterol, ASA, depakote, lasix, lisinopril, potassium, seroquel, zantac, spiriva.   Preoperative labs reviewed.  HgbA1c 5.4, glucose 107  EKG 04/22/16: NSR  Nuclear stress test 04/25/16:   There was no ST segment deviation noted during stress.  No T wave inversion was noted during stress.  The study is normal.  The left ventricular ejection fraction is hyperdynamic (>65%).  Nuclear stress EF: 72%.  1. No ischemia or infarction by perfusion images. n 2. Normal EF and wall motion.   Echo 04/25/16:  - Left ventricle: The cavity size was normal. Wall thickness was normal. Systolic function was normal. The estimated ejection fraction was in the range of 55% to 60%. Wall motion was normal; there were no regional wall motion abnormalities. Doppler parameters are consistent with abnormal left ventricular relaxation (grade 1 diastolic dysfunction). - Aortic valve: There was no stenosis. - Mitral valve: There was trivial regurgitation. - Right ventricle: The cavity size was normal. Systolic function was normal. - Pulmonary arteries: PA peak pressure: 27 mm Hg (S). - Inferior vena cava: The vessel was normal in size. The respirophasic diameter changes were in the normal range (= 50%), consistent with normal central venous pressure. - Pericardium, extracardiac: Small circumferential pericardial effusion, no tamponade.  Pt saw Lauree Chandler, MD with cardiology 04/22/16 for evaluation of chest pain sx. Stress test and echo as above. Dr. Angelena Form has cleared pt for surgery.   If no changes, I anticipate pt can proceed with surgery as scheduled.   Willeen Cass, FNP-BC [facility] Short Stay  Surgical Center/Anesthesiology Phone: [phone] 05/08/2016 11:56 AM

## 2016-05-13 MED ORDER — CEFAZOLIN SODIUM-DEXTROSE 2-4 GM/100ML-% IV SOLN
2.0000 g | INTRAVENOUS | Status: AC
  Administered 2016-05-14: 2 g via INTRAVENOUS
  Filled 2016-05-13: qty 100

## 2016-05-13 MED ORDER — SODIUM CHLORIDE 0.9 % IV SOLN
1000.0000 mg | INTRAVENOUS | Status: AC
  Administered 2016-05-14: 1000 mg via INTRAVENOUS
  Filled 2016-05-13: qty 10

## 2016-05-13 MED ORDER — LACTATED RINGERS IV SOLN
INTRAVENOUS | Status: DC
  Administered 2016-05-14 (×2): via INTRAVENOUS

## 2016-05-13 MED ORDER — ACETAMINOPHEN 500 MG PO TABS
1000.0000 mg | ORAL_TABLET | Freq: Once | ORAL | Status: AC
  Administered 2016-05-14: 1000 mg via ORAL
  Filled 2016-05-13: qty 2

## 2016-05-14 ENCOUNTER — Inpatient Hospital Stay (HOSPITAL_COMMUNITY): Payer: Medicaid Other | Admitting: Emergency Medicine

## 2016-05-14 ENCOUNTER — Encounter (HOSPITAL_COMMUNITY)
Admission: RE | Disposition: A | Discharge status: Home Health | Payer: Self-pay | Source: Ambulatory Visit | Attending: Orthopedic Surgery

## 2016-05-14 ENCOUNTER — Inpatient Hospital Stay (HOSPITAL_COMMUNITY)
Admission: RE | Admit: 2016-05-14 | Discharge: 2016-05-16 | DRG: 470 | Disposition: A | Discharge status: Home Health | Payer: Medicaid Other | Source: Ambulatory Visit | Attending: Orthopedic Surgery | Admitting: Orthopedic Surgery

## 2016-05-14 ENCOUNTER — Encounter (HOSPITAL_COMMUNITY): Payer: Self-pay | Admitting: Surgery

## 2016-05-14 ENCOUNTER — Inpatient Hospital Stay (HOSPITAL_COMMUNITY): Payer: Medicaid Other | Admitting: Anesthesiology

## 2016-05-14 ENCOUNTER — Inpatient Hospital Stay (HOSPITAL_COMMUNITY): Payer: Medicaid Other

## 2016-05-14 DIAGNOSIS — E119 Type 2 diabetes mellitus without complications: Secondary | ICD-10-CM | POA: Diagnosis present

## 2016-05-14 DIAGNOSIS — D62 Acute posthemorrhagic anemia: Secondary | ICD-10-CM | POA: Diagnosis not present

## 2016-05-14 DIAGNOSIS — D259 Leiomyoma of uterus, unspecified: Secondary | ICD-10-CM | POA: Diagnosis present

## 2016-05-14 DIAGNOSIS — M1612 Unilateral primary osteoarthritis, left hip: Principal | ICD-10-CM | POA: Diagnosis present

## 2016-05-14 DIAGNOSIS — F250 Schizoaffective disorder, bipolar type: Secondary | ICD-10-CM | POA: Diagnosis present

## 2016-05-14 DIAGNOSIS — F17210 Nicotine dependence, cigarettes, uncomplicated: Secondary | ICD-10-CM | POA: Diagnosis present

## 2016-05-14 DIAGNOSIS — J449 Chronic obstructive pulmonary disease, unspecified: Secondary | ICD-10-CM | POA: Diagnosis present

## 2016-05-14 DIAGNOSIS — M5136 Other intervertebral disc degeneration, lumbar region: Secondary | ICD-10-CM | POA: Diagnosis present

## 2016-05-14 DIAGNOSIS — M25552 Pain in left hip: Secondary | ICD-10-CM | POA: Diagnosis present

## 2016-05-14 DIAGNOSIS — M419 Scoliosis, unspecified: Secondary | ICD-10-CM | POA: Diagnosis present

## 2016-05-14 DIAGNOSIS — Z6839 Body mass index (BMI) 39.0-39.9, adult: Secondary | ICD-10-CM | POA: Diagnosis not present

## 2016-05-14 DIAGNOSIS — F1421 Cocaine dependence, in remission: Secondary | ICD-10-CM | POA: Diagnosis present

## 2016-05-14 DIAGNOSIS — M51369 Other intervertebral disc degeneration, lumbar region without mention of lumbar back pain or lower extremity pain: Secondary | ICD-10-CM | POA: Diagnosis present

## 2016-05-14 DIAGNOSIS — F1110 Opioid abuse, uncomplicated: Secondary | ICD-10-CM | POA: Diagnosis present

## 2016-05-14 DIAGNOSIS — F1221 Cannabis dependence, in remission: Secondary | ICD-10-CM | POA: Diagnosis present

## 2016-05-14 DIAGNOSIS — F1021 Alcohol dependence, in remission: Secondary | ICD-10-CM

## 2016-05-14 DIAGNOSIS — I10 Essential (primary) hypertension: Secondary | ICD-10-CM | POA: Diagnosis present

## 2016-05-14 DIAGNOSIS — F1410 Cocaine abuse, uncomplicated: Secondary | ICD-10-CM | POA: Diagnosis present

## 2016-05-14 DIAGNOSIS — G894 Chronic pain syndrome: Secondary | ICD-10-CM | POA: Diagnosis present

## 2016-05-14 DIAGNOSIS — Z419 Encounter for procedure for purposes other than remedying health state, unspecified: Secondary | ICD-10-CM

## 2016-05-14 DIAGNOSIS — Z720 Tobacco use: Secondary | ICD-10-CM | POA: Diagnosis present

## 2016-05-14 DIAGNOSIS — D219 Benign neoplasm of connective and other soft tissue, unspecified: Secondary | ICD-10-CM | POA: Diagnosis present

## 2016-05-14 DIAGNOSIS — Z8781 Personal history of (healed) traumatic fracture: Secondary | ICD-10-CM

## 2016-05-14 HISTORY — PX: TOTAL HIP ARTHROPLASTY: SHX124

## 2016-05-14 LAB — GLUCOSE, CAPILLARY: Glucose-Capillary: 130 mg/dL — ABNORMAL HIGH (ref 65–99)

## 2016-05-14 SURGERY — ARTHROPLASTY, HIP, TOTAL, ANTERIOR APPROACH
Anesthesia: Monitor Anesthesia Care | Laterality: Left

## 2016-05-14 MED ORDER — QUETIAPINE FUMARATE 300 MG PO TABS
300.0000 mg | ORAL_TABLET | Freq: Every day | ORAL | Status: DC
  Administered 2016-05-14 – 2016-05-15 (×2): 300 mg via ORAL
  Filled 2016-05-14 (×3): qty 1

## 2016-05-14 MED ORDER — METOCLOPRAMIDE HCL 5 MG/ML IJ SOLN: 5.0000 mg | Freq: Three times a day (TID) | INTRAMUSCULAR | Status: DC | PRN

## 2016-05-14 MED ORDER — HYDROXYZINE HCL 25 MG PO TABS
50.0000 mg | ORAL_TABLET | Freq: Three times a day (TID) | ORAL | Status: DC | PRN
Start: 2016-05-14 — End: 2016-05-16

## 2016-05-14 MED ORDER — ONDANSETRON HCL 4 MG/2ML IJ SOLN
INTRAMUSCULAR | Status: AC
  Filled 2016-05-14: qty 2

## 2016-05-14 MED ORDER — OMEPRAZOLE 20 MG PO CPDR: 20.0000 mg | DELAYED_RELEASE_CAPSULE | Freq: Every day | ORAL | Status: DC

## 2016-05-14 MED ORDER — OXYCODONE HCL 5 MG PO TABS: 5.0000 mg | ORAL_TABLET | Freq: Once | ORAL | Status: DC | PRN

## 2016-05-14 MED ORDER — CELECOXIB 200 MG PO CAPS
200.0000 mg | ORAL_CAPSULE | Freq: Two times a day (BID) | ORAL | Status: DC
  Administered 2016-05-14 – 2016-05-16 (×4): 200 mg via ORAL
  Filled 2016-05-14 (×4): qty 1

## 2016-05-14 MED ORDER — TIOTROPIUM BROMIDE MONOHYDRATE 18 MCG IN CAPS
18.0000 ug | ORAL_CAPSULE | RESPIRATORY_TRACT | Status: DC
  Administered 2016-05-15 – 2016-05-16 (×2): 18 ug via RESPIRATORY_TRACT
  Filled 2016-05-14: qty 5

## 2016-05-14 MED ORDER — MIDAZOLAM HCL 5 MG/5ML IJ SOLN
INTRAMUSCULAR | Status: DC | PRN
  Administered 2016-05-14: 2 mg via INTRAVENOUS

## 2016-05-14 MED ORDER — OXYCODONE-ACETAMINOPHEN 5-325 MG PO TABS: 1.0000 | ORAL_TABLET | ORAL | Status: DC | PRN

## 2016-05-14 MED ORDER — MENTHOL 3 MG MT LOZG
1.0000 | LOZENGE | OROMUCOSAL | Status: DC | PRN
Start: 2016-05-14 — End: 2016-05-16

## 2016-05-14 MED ORDER — OXYCODONE HCL 5 MG PO TABS
5.0000 mg | ORAL_TABLET | ORAL | Status: DC | PRN
  Administered 2016-05-14 – 2016-05-16 (×8): 10 mg via ORAL
  Filled 2016-05-14 (×8): qty 2

## 2016-05-14 MED ORDER — EPHEDRINE 5 MG/ML INJ
INTRAVENOUS | Status: AC
  Filled 2016-05-14: qty 10

## 2016-05-14 MED ORDER — LISINOPRIL 10 MG PO TABS
10.0000 mg | ORAL_TABLET | Freq: Every day | ORAL | Status: DC
  Administered 2016-05-16: 10 mg via ORAL
  Filled 2016-05-14 (×2): qty 1

## 2016-05-14 MED ORDER — MIDAZOLAM HCL 2 MG/2ML IJ SOLN
INTRAMUSCULAR | Status: AC
  Filled 2016-05-14: qty 2

## 2016-05-14 MED ORDER — PHENYLEPHRINE 40 MCG/ML (10ML) SYRINGE FOR IV PUSH (FOR BLOOD PRESSURE SUPPORT)
PREFILLED_SYRINGE | INTRAVENOUS | Status: AC
  Filled 2016-05-14: qty 10

## 2016-05-14 MED ORDER — ONDANSETRON HCL 4 MG PO TABS: 4.0000 mg | ORAL_TABLET | Freq: Three times a day (TID) | ORAL | Status: DC | PRN

## 2016-05-14 MED ORDER — ALBUTEROL SULFATE HFA 108 (90 BASE) MCG/ACT IN AERS
INHALATION_SPRAY | RESPIRATORY_TRACT | Status: AC
  Filled 2016-05-14: qty 6.7

## 2016-05-14 MED ORDER — PHENYLEPHRINE HCL 10 MG/ML IJ SOLN
INTRAMUSCULAR | Status: DC | PRN
  Administered 2016-05-14: 120 ug via INTRAVENOUS
  Administered 2016-05-14 (×2): 80 ug via INTRAVENOUS

## 2016-05-14 MED ORDER — ONDANSETRON HCL 4 MG/2ML IJ SOLN: 4.0000 mg | Freq: Four times a day (QID) | INTRAMUSCULAR | Status: DC | PRN

## 2016-05-14 MED ORDER — METOCLOPRAMIDE HCL 5 MG PO TABS
5.0000 mg | ORAL_TABLET | Freq: Three times a day (TID) | ORAL | Status: DC | PRN
  Administered 2016-05-15: 10 mg via ORAL
  Filled 2016-05-14: qty 2

## 2016-05-14 MED ORDER — METHOCARBAMOL 1000 MG/10ML IJ SOLN
500.0000 mg | Freq: Four times a day (QID) | INTRAMUSCULAR | Status: DC | PRN
  Filled 2016-05-14: qty 5

## 2016-05-14 MED ORDER — DIVALPROEX SODIUM 500 MG PO DR TAB
500.0000 mg | DELAYED_RELEASE_TABLET | Freq: Two times a day (BID) | ORAL | Status: DC
  Administered 2016-05-14 – 2016-05-16 (×4): 500 mg via ORAL
  Filled 2016-05-14 (×4): qty 1

## 2016-05-14 MED ORDER — ALBUTEROL SULFATE (2.5 MG/3ML) 0.083% IN NEBU: 3.0000 mL | INHALATION_SOLUTION | Freq: Four times a day (QID) | RESPIRATORY_TRACT | Status: DC | PRN

## 2016-05-14 MED ORDER — LIDOCAINE 2% (20 MG/ML) 5 ML SYRINGE
INTRAMUSCULAR | Status: AC
  Filled 2016-05-14: qty 5

## 2016-05-14 MED ORDER — MORPHINE SULFATE (PF) 2 MG/ML IV SOLN
2.0000 mg | INTRAVENOUS | Status: DC | PRN
  Administered 2016-05-14 – 2016-05-15 (×3): 2 mg via INTRAVENOUS
  Filled 2016-05-14 (×4): qty 1

## 2016-05-14 MED ORDER — ACETAMINOPHEN 650 MG RE SUPP: 650.0000 mg | Freq: Four times a day (QID) | RECTAL | Status: DC | PRN

## 2016-05-14 MED ORDER — PHENOL 1.4 % MT LIQD: 1.0000 | OROMUCOSAL | Status: DC | PRN

## 2016-05-14 MED ORDER — EPHEDRINE SULFATE 50 MG/ML IJ SOLN
INTRAMUSCULAR | Status: DC | PRN
  Administered 2016-05-14 (×2): 5 mg via INTRAVENOUS

## 2016-05-14 MED ORDER — FENTANYL CITRATE (PF) 100 MCG/2ML IJ SOLN
INTRAMUSCULAR | Status: DC | PRN
  Administered 2016-05-14: 50 ug via INTRAVENOUS

## 2016-05-14 MED ORDER — KETOROLAC TROMETHAMINE 15 MG/ML IJ SOLN
15.0000 mg | Freq: Four times a day (QID) | INTRAMUSCULAR | Status: AC
  Administered 2016-05-14 – 2016-05-15 (×4): 15 mg via INTRAVENOUS
  Filled 2016-05-14 (×4): qty 1

## 2016-05-14 MED ORDER — ASPIRIN EC 325 MG PO TBEC
325.0000 mg | DELAYED_RELEASE_TABLET | Freq: Every day | ORAL | Status: DC
  Administered 2016-05-15 – 2016-05-16 (×2): 325 mg via ORAL
  Filled 2016-05-14 (×2): qty 1

## 2016-05-14 MED ORDER — CEFAZOLIN SODIUM-DEXTROSE 2-4 GM/100ML-% IV SOLN
2.0000 g | Freq: Four times a day (QID) | INTRAVENOUS | Status: AC
  Administered 2016-05-14 (×2): 2 g via INTRAVENOUS
  Filled 2016-05-14 (×2): qty 100

## 2016-05-14 MED ORDER — OXYCODONE HCL 5 MG/5ML PO SOLN: 5.0000 mg | Freq: Once | ORAL | Status: DC | PRN

## 2016-05-14 MED ORDER — LIDOCAINE HCL 2 % EX LIQD: 1.0000 "application " | CUTANEOUS | Status: DC | PRN

## 2016-05-14 MED ORDER — CHLORHEXIDINE GLUCONATE 4 % EX LIQD: 60.0000 mL | Freq: Once | CUTANEOUS | Status: DC

## 2016-05-14 MED ORDER — ASPIRIN EC 325 MG PO TBEC: 325.0000 mg | DELAYED_RELEASE_TABLET | Freq: Every day | ORAL | Status: DC

## 2016-05-14 MED ORDER — ONDANSETRON HCL 4 MG PO TABS: 4.0000 mg | ORAL_TABLET | Freq: Four times a day (QID) | ORAL | Status: DC | PRN

## 2016-05-14 MED ORDER — LIDOCAINE HCL (CARDIAC) 20 MG/ML IV SOLN
INTRAVENOUS | Status: DC | PRN
  Administered 2016-05-14: 50 mg via INTRAVENOUS

## 2016-05-14 MED ORDER — METHOCARBAMOL 500 MG PO TABS: 500.0000 mg | ORAL_TABLET | Freq: Four times a day (QID) | ORAL | Status: DC | PRN

## 2016-05-14 MED ORDER — BUPIVACAINE-EPINEPHRINE (PF) 0.25% -1:200000 IJ SOLN
INTRAMUSCULAR | Status: AC
  Filled 2016-05-14: qty 30

## 2016-05-14 MED ORDER — ROCURONIUM BROMIDE 50 MG/5ML IV SOLN
INTRAVENOUS | Status: AC
  Filled 2016-05-14: qty 1

## 2016-05-14 MED ORDER — FUROSEMIDE 40 MG PO TABS
40.0000 mg | ORAL_TABLET | Freq: Every day | ORAL | Status: DC
  Administered 2016-05-15 – 2016-05-16 (×2): 40 mg via ORAL
  Filled 2016-05-14 (×2): qty 1

## 2016-05-14 MED ORDER — DIPHENHYDRAMINE HCL 12.5 MG/5ML PO ELIX: 12.5000 mg | ORAL_SOLUTION | ORAL | Status: DC | PRN

## 2016-05-14 MED ORDER — ONDANSETRON HCL 4 MG/2ML IJ SOLN
INTRAMUSCULAR | Status: DC | PRN
  Administered 2016-05-14: 4 mg via INTRAVENOUS

## 2016-05-14 MED ORDER — PROPOFOL 10 MG/ML IV BOLUS
INTRAVENOUS | Status: AC
  Filled 2016-05-14: qty 20

## 2016-05-14 MED ORDER — DOCUSATE SODIUM 100 MG PO CAPS
100.0000 mg | ORAL_CAPSULE | Freq: Two times a day (BID) | ORAL | Status: DC
  Administered 2016-05-14 – 2016-05-16 (×4): 100 mg via ORAL
  Filled 2016-05-14 (×4): qty 1

## 2016-05-14 MED ORDER — BUPIVACAINE-EPINEPHRINE 0.25% -1:200000 IJ SOLN
INTRAMUSCULAR | Status: DC | PRN
  Administered 2016-05-14: 30 mL

## 2016-05-14 MED ORDER — HYDROMORPHONE HCL 1 MG/ML IJ SOLN: 0.2500 mg | INTRAMUSCULAR | Status: DC | PRN

## 2016-05-14 MED ORDER — ACETAMINOPHEN 325 MG PO TABS
650.0000 mg | ORAL_TABLET | Freq: Four times a day (QID) | ORAL | Status: DC | PRN
  Administered 2016-05-14: 650 mg via ORAL
  Filled 2016-05-14: qty 2

## 2016-05-14 MED ORDER — PROPOFOL 500 MG/50ML IV EMUL
INTRAVENOUS | Status: DC | PRN
Start: 2016-05-14 — End: 2016-05-14
  Administered 2016-05-14: 25 ug/kg/min via INTRAVENOUS

## 2016-05-14 MED ORDER — ALBUTEROL SULFATE HFA 108 (90 BASE) MCG/ACT IN AERS
INHALATION_SPRAY | RESPIRATORY_TRACT | Status: DC | PRN
  Administered 2016-05-14: 2 via RESPIRATORY_TRACT

## 2016-05-14 MED ORDER — DEXAMETHASONE SODIUM PHOSPHATE 10 MG/ML IJ SOLN
10.0000 mg | Freq: Once | INTRAMUSCULAR | Status: AC
  Administered 2016-05-15: 10 mg via INTRAVENOUS
  Filled 2016-05-14: qty 1

## 2016-05-14 MED ORDER — DOCUSATE SODIUM 100 MG PO CAPS: 100.0000 mg | ORAL_CAPSULE | Freq: Two times a day (BID) | ORAL | Status: DC

## 2016-05-14 MED ORDER — KETOROLAC TROMETHAMINE 30 MG/ML IJ SOLN
INTRAMUSCULAR | Status: DC | PRN
  Administered 2016-05-14: 30 mg via INTRAVENOUS

## 2016-05-14 MED ORDER — 0.9 % SODIUM CHLORIDE (POUR BTL) OPTIME
TOPICAL | Status: DC | PRN
  Administered 2016-05-14: 1000 mL

## 2016-05-14 MED ORDER — KETOROLAC TROMETHAMINE 30 MG/ML IJ SOLN
INTRAMUSCULAR | Status: AC
  Filled 2016-05-14: qty 1

## 2016-05-14 MED ORDER — PHENYLEPHRINE HCL 10 MG/ML IJ SOLN
10.0000 mg | INTRAVENOUS | Status: DC | PRN
  Administered 2016-05-14: 30 ug/min via INTRAVENOUS

## 2016-05-14 MED ORDER — DEXTROSE-NACL 5-0.45 % IV SOLN
INTRAVENOUS | Status: DC
  Administered 2016-05-14: via INTRAVENOUS

## 2016-05-14 MED ORDER — BUPIVACAINE IN DEXTROSE 0.75-8.25 % IT SOLN
INTRATHECAL | Status: DC | PRN
  Administered 2016-05-14: 2 mL via INTRATHECAL

## 2016-05-14 MED ORDER — SODIUM CHLORIDE FLUSH 0.9 % IV SOLN
INTRAVENOUS | Status: DC | PRN
  Administered 2016-05-14: 30 mL via INTRAVENOUS

## 2016-05-14 MED ORDER — METHOCARBAMOL 500 MG PO TABS
500.0000 mg | ORAL_TABLET | Freq: Four times a day (QID) | ORAL | Status: DC | PRN
Start: 2016-05-14 — End: 2016-05-16
  Administered 2016-05-14 – 2016-05-15 (×3): 500 mg via ORAL
  Filled 2016-05-14 (×3): qty 1

## 2016-05-14 MED ORDER — FENTANYL CITRATE (PF) 250 MCG/5ML IJ SOLN
INTRAMUSCULAR | Status: AC
  Filled 2016-05-14: qty 5

## 2016-05-14 SURGICAL SUPPLY — 54 items
BAG DECANTER FOR FLEXI CONT (MISCELLANEOUS) IMPLANT
BLADE SAG 18X100X1.27 (BLADE) ×3 IMPLANT
BLADE SAW SGTL 18X1.27X75 (BLADE) IMPLANT
BLADE SAW SGTL 18X1.27X75MM (BLADE)
BLADE SURG ROTATE 9660 (MISCELLANEOUS) IMPLANT
CAPT HIP TOTAL 2 ×3 IMPLANT
CLOSURE STERI-STRIP 1/2X4 (GAUZE/BANDAGES/DRESSINGS) ×1
CLSR STERI-STRIP ANTIMIC 1/2X4 (GAUZE/BANDAGES/DRESSINGS) ×2 IMPLANT
COVER PERINEAL POST (MISCELLANEOUS) ×3 IMPLANT
COVER SURGICAL LIGHT HANDLE (MISCELLANEOUS) ×3 IMPLANT
DRAPE C-ARM 42X72 X-RAY (DRAPES) ×3 IMPLANT
DRAPE STERI IOBAN 125X83 (DRAPES) ×3 IMPLANT
DRAPE U-SHAPE 47X51 STRL (DRAPES) ×6 IMPLANT
DRSG MEPILEX BORDER 4X8 (GAUZE/BANDAGES/DRESSINGS) ×3 IMPLANT
DURAPREP 26ML APPLICATOR (WOUND CARE) ×3 IMPLANT
ELECT BLADE 4.0 EZ CLEAN MEGAD (MISCELLANEOUS) ×3
ELECT REM PT RETURN 9FT ADLT (ELECTROSURGICAL) ×3
ELECTRODE BLDE 4.0 EZ CLN MEGD (MISCELLANEOUS) ×1 IMPLANT
ELECTRODE REM PT RTRN 9FT ADLT (ELECTROSURGICAL) ×1 IMPLANT
FACESHIELD WRAPAROUND (MASK) ×6 IMPLANT
GLOVE BIO SURGEON STRL SZ7 (GLOVE) ×3 IMPLANT
GLOVE BIO SURGEON STRL SZ7.5 (GLOVE) ×3 IMPLANT
GLOVE BIOGEL PI IND STRL 7.0 (GLOVE) ×1 IMPLANT
GLOVE BIOGEL PI IND STRL 8 (GLOVE) ×1 IMPLANT
GLOVE BIOGEL PI INDICATOR 7.0 (GLOVE) ×2
GLOVE BIOGEL PI INDICATOR 8 (GLOVE) ×2
GOWN STRL REUS W/ TWL LRG LVL3 (GOWN DISPOSABLE) ×2 IMPLANT
GOWN STRL REUS W/ TWL XL LVL3 (GOWN DISPOSABLE) ×1 IMPLANT
GOWN STRL REUS W/TWL LRG LVL3 (GOWN DISPOSABLE) ×4
GOWN STRL REUS W/TWL XL LVL3 (GOWN DISPOSABLE) ×2
KIT BASIN OR (CUSTOM PROCEDURE TRAY) ×3 IMPLANT
KIT ROOM TURNOVER OR (KITS) ×3 IMPLANT
MANIFOLD NEPTUNE II (INSTRUMENTS) ×3 IMPLANT
NDL SAFETY ECLIPSE 18X1.5 (NEEDLE) ×1 IMPLANT
NEEDLE 18GX1X1/2 (RX/OR ONLY) (NEEDLE) ×3 IMPLANT
NEEDLE HYPO 18GX1.5 SHARP (NEEDLE) ×2
NS IRRIG 1000ML POUR BTL (IV SOLUTION) ×3 IMPLANT
PACK TOTAL JOINT (CUSTOM PROCEDURE TRAY) ×3 IMPLANT
PACK UNIVERSAL I (CUSTOM PROCEDURE TRAY) ×3 IMPLANT
PAD ARMBOARD 7.5X6 YLW CONV (MISCELLANEOUS) ×3 IMPLANT
SPONGE LAP 18X18 X RAY DECT (DISPOSABLE) IMPLANT
SUT MNCRL AB 4-0 PS2 18 (SUTURE) ×3 IMPLANT
SUT MON AB 2-0 CT1 36 (SUTURE) ×3 IMPLANT
SUT VIC AB 0 CT1 27 (SUTURE) ×2
SUT VIC AB 0 CT1 27XBRD ANBCTR (SUTURE) ×1 IMPLANT
SUT VIC AB 1 CT1 27 (SUTURE) ×2
SUT VIC AB 1 CT1 27XBRD ANBCTR (SUTURE) ×1 IMPLANT
SYR 50ML LL SCALE MARK (SYRINGE) ×3 IMPLANT
SYRINGE 20CC LL (MISCELLANEOUS) IMPLANT
TOWEL OR 17X24 6PK STRL BLUE (TOWEL DISPOSABLE) ×3 IMPLANT
TOWEL OR 17X26 10 PK STRL BLUE (TOWEL DISPOSABLE) ×3 IMPLANT
TRAY FOLEY CATH 16FRSI W/METER (SET/KITS/TRAYS/PACK) IMPLANT
WATER STERILE IRR 1000ML POUR (IV SOLUTION) ×3 IMPLANT
YANKAUER SUCT BULB TIP NO VENT (SUCTIONS) ×3 IMPLANT

## 2016-05-14 NOTE — Anesthesia Postprocedure Evaluation (Signed)
Anesthesia Post Note  Patient: Alicia Blake  Procedure(s) Performed: Procedure(s) (LRB): TOTAL HIP ARTHROPLASTY ANTERIOR APPROACH (Left)  Patient location during evaluation: PACU Anesthesia Type: Spinal Level of consciousness: oriented and awake and alert Pain management: pain level controlled Vital Signs Assessment: post-procedure vital signs reviewed and stable Respiratory status: spontaneous breathing, respiratory function stable and patient connected to nasal cannula oxygen Cardiovascular status: blood pressure returned to baseline and stable Postop Assessment: no headache and no backache Anesthetic complications: no    Last Vitals:  Filed Vitals:   05/14/16 1330 05/14/16 1345  BP: 87/69 89/69  Pulse: 67 63  Temp:    Resp: 13 11    Last Pain:  Filed Vitals:   05/14/16 1353  PainSc: 0-No pain                 Fatiha Guzy S

## 2016-05-14 NOTE — Progress Notes (Signed)
Pt arrived to the unit via bed from PACU; pt A&O x4; pt oriented to the unit and room; IV intact and transfusing; foley intact and unclamped; left hip incision mepilex dsg remains clean, dry and intact with not stain or active bleeding noted. VSS; pt report sensations to BLE; Fall/safety precaution and prevention education completed with pt. Pt voices understanding and denies any questions. Pt will continue to monitor pt closely and report off to oncoming RN. Francis Gaines Minoru Chap RN.

## 2016-05-14 NOTE — Op Note (Signed)
05/14/2016  12:35 PM  PATIENT:  Alicia Blake   MRN: [id number]  PRE-OPERATIVE DIAGNOSIS:  OA LEFT HIP  POST-OPERATIVE DIAGNOSIS:  OA LEFT HIP  PROCEDURE:  Procedure(s): TOTAL HIP ARTHROPLASTY ANTERIOR APPROACH  PREOPERATIVE INDICATIONS:    Alicia Blake is an 50 y.o. female who has a diagnosis of Primary osteoarthritis of left hip and elected for surgical management after failing conservative treatment.  The risks benefits and alternatives were discussed with the patient including but not limited to the risks of nonoperative treatment, versus surgical intervention including infection, bleeding, nerve injury, periprosthetic fracture, the need for revision surgery, dislocation, leg length discrepancy, blood clots, cardiopulmonary complications, morbidity, mortality, among others, and they were willing to proceed.     OPERATIVE REPORT     SURGEON:   Alicia Blake, Ernesta Amble, MD    ASSISTANT:  Roxan Hockey, PA-C, he was present and scrubbed throughout the case, critical for completion in a timely fashion, and for retraction, instrumentation, and closure.     ANESTHESIA:  General    COMPLICATIONS:  None.     COMPONENTS:  Stryker acolade fit femur size 3 with a 36 mm -2.5 head ball and a PSL acetabular shell size 52. with a  polyethylene liner    PROCEDURE IN DETAIL:   The patient was met in the holding area and  identified.  The appropriate hip was identified and marked at the operative site.  The patient was then transported to the OR  and  placed under general anesthesia.  At that point, the patient was  placed in the supine position and  secured to the operating room table and all bony prominences padded. He received pre-operative antibiotics    The operative lower extremity was prepped from the iliac crest to the distal leg.  Sterile draping was performed.  Time out was performed prior to incision.      Skin incision was made just 2 cm lateral to the ASIS  extending in line with  the tensor fascia lata. Electrocautery was used to control all bleeders. I dissected down sharply to the fascia of the tensor fascia lata was confirmed that the muscle fibers beneath were running posteriorly. I then incised the fascia over the superficial tensor fascia lata in line with the incision. The fascia was elevated off the anterior aspect of the muscle the muscle was retracted posteriorly and protected throughout the case. I then used electrocautery to incise the tensor fascia lata fascia control and all bleeders. Immediately visible was the fat over top of the anterior neck and capsule.  I removed the anterior fat from the capsule and elevated the rectus muscle off of the anterior capsule. I then removed a large time of capsule. The retractors were then placed over the anterior acetabulum as well as around the superior and inferior neck.  I then removed a section of the femoral neck and a napkin ring fashion. Then used the power course to remove the femoral head from the acetabulum and thoroughly irrigated the acetabulum. I sized the femoral head.    I then exposed the deep acetabulum, cleared out any tissue including the ligamentum teres.   After adequate visualization, I excised the labrum, and then sequentially reamed.  I placed the trial acetabulum, which seated nicely, and then impacted the real cup into place.  Appropriate version and inclination was confirmed clinically matching their bony anatomy, and also with the use transverse acetabular ligament.  I placed a 24m screw in the posterior/superio  position with an excellent bite.    I then placed the polyethylene liner in place  I then abducted the leg and released the external rotators from the posterior femur allowing it to be easily delivered up lateral and anterior to the acetabulum for preparation of the femoral canal.    I then prepared the proximal femur using the cookie-cutter and then sequentially reamed and broached.  A  trial broach, neck, and head was utilized, and I reduced the hip and it was found to have excellent stability with functional range of motion..  I then impacted the real femoral prosthesis into place into the appropriate version, slightly anteverted to the normal anatomy, and I impacted the real head ball into place. The hip was then reduced and taken through functional range of motion and found to have excellent stability. Leg lengths were restored.  I then irrigated the hip copiously again with, and repaired the fascia with Vicryl, followed by monocryl for the subcutaneous tissue, Monocryl for the skin, Steri-Strips and sterile gauze. The patient was then awakened and returned to PACU in stable and satisfactory condition. There were no complications.  POST OPERATIVE PLAN: WBAT, DVT px: SCD's/TED and chemical px  Edmonia Lynch, MD Orthopedic Surgeon [phone]   This note was generated using a template and dragon dictation system. In light of that, I have reviewed the note and all aspects of it are applicable to this case. Any dictation errors are due to the computerized dictation system.

## 2016-05-14 NOTE — Transfer of Care (Signed)
Immediate Anesthesia Transfer of Care Note  Patient: Alicia Blake  Procedure(s) Performed: Procedure(s): TOTAL HIP ARTHROPLASTY ANTERIOR APPROACH (Left)  Patient Location: PACU  Anesthesia Type:MAC and Spinal  Level of Consciousness: awake, alert , oriented and patient cooperative  Airway & Oxygen Therapy: Patient Spontanous Breathing and Patient connected to face mask oxygen  Post-op Assessment: Report given to RN and Post -op Vital signs reviewed and stable  Post vital signs: Reviewed  Last Vitals:  Filed Vitals:   05/14/16 0831  BP: 107/51  Pulse: 97  Temp: 36.6 C  Resp: 20    Last Pain:  Filed Vitals:   05/14/16 0832  PainSc: 9       Patients Stated Pain Goal: 8 ([id number])  Complications: No apparent anesthesia complications

## 2016-05-14 NOTE — Discharge Instructions (Signed)

## 2016-05-14 NOTE — Anesthesia Preprocedure Evaluation (Signed)
Anesthesia Evaluation  Patient identified by MRN, date of birth, ID band Patient awake    Reviewed: Allergy & Precautions, H&P , NPO status , Patient's Chart, lab work & pertinent test results  Airway Mallampati: II   Neck ROM: full    Dental   Pulmonary shortness of breath, asthma , COPD, Current Smoker,    breath sounds clear to auscultation       Cardiovascular hypertension,  Rhythm:regular Rate:Normal     Neuro/Psych PSYCHIATRIC DISORDERS Depression Bipolar Disorder    GI/Hepatic GERD  ,  Endo/Other  diabetes, Type 2Morbid obesity  Renal/GU      Musculoskeletal  (+) Arthritis ,   Abdominal   Peds  Hematology   Anesthesia Other Findings   Reproductive/Obstetrics                             Anesthesia Physical Anesthesia Plan  ASA: II  Anesthesia Plan: MAC and Spinal   Post-op Pain Management:    Induction: Intravenous  Airway Management Planned: Simple Face Mask  Additional Equipment:   Intra-op Plan:   Post-operative Plan:   Informed Consent: I have reviewed the patients History and Physical, chart, labs and discussed the procedure including the risks, benefits and alternatives for the proposed anesthesia with the patient or authorized representative who has indicated his/her understanding and acceptance.     Plan Discussed with: Anesthesiologist, CRNA and Surgeon  Anesthesia Plan Comments:         Anesthesia Quick Evaluation

## 2016-05-14 NOTE — Anesthesia Procedure Notes (Addendum)
Spinal Patient location during procedure: OR Start time: 05/14/2016 10:51 AM End time: 05/14/2016 10:57 AM Staffing Anesthesiologist: HODIERNE, ADAM Performed by: anesthesiologist  Preanesthetic Checklist Completed: patient identified, site marked, surgical consent, pre-op evaluation, timeout performed, IV checked, risks and benefits discussed and monitors and equipment checked Spinal Block Patient position: sitting Prep: Betadine and site prepped and draped Patient monitoring: heart rate, cardiac monitor, continuous pulse ox and blood pressure Approach: midline Location: L3-4 Injection technique: single-shot Needle Needle type: Pencan  Needle gauge: 24 G Needle length: 10 cm Assessment Sensory level: T6 Additional Notes Pt tolerated the procedure well.  Procedure Name: MAC Date/Time: 05/14/2016 10:53 AM Performed by: Jenne Campus Pre-anesthesia Checklist: Patient identified, Emergency Drugs available, Suction available, Patient being monitored and Timeout performed Oxygen Delivery Method: Simple face mask

## 2016-05-15 ENCOUNTER — Encounter (HOSPITAL_COMMUNITY): Payer: Self-pay | Admitting: Orthopedic Surgery

## 2016-05-15 LAB — BASIC METABOLIC PANEL
Anion gap: 6 (ref 5–15)
BUN: 10 mg/dL (ref 6–20)
CALCIUM: 8.4 mg/dL — AB (ref 8.9–10.3)
CO2: 27 mmol/L (ref 22–32)
CREATININE: 1.03 mg/dL — AB (ref 0.44–1.00)
Chloride: 102 mmol/L (ref 101–111)
GFR calc Af Amer: >60 mL/min (ref >60)
GFR calc non Af Amer: >60 mL/min (ref >60)
GLUCOSE: 140 mg/dL — AB (ref 65–99)
Potassium: 3.8 mmol/L (ref 3.5–5.1)
Sodium: 135 mmol/L (ref 135–145)

## 2016-05-15 LAB — CBC
HCT: 29.6 % — ABNORMAL LOW (ref 36.0–46.0)
Hemoglobin: 9.8 g/dL — ABNORMAL LOW (ref 12.0–15.0)
MCH: 30.8 pg (ref 26.0–34.0)
MCHC: 33.1 g/dL (ref 30.0–36.0)
MCV: 93.1 fL (ref 78.0–100.0)
PLATELETS: 172 10*3/uL (ref 150–400)
RBC: 3.18 MIL/uL — ABNORMAL LOW (ref 3.87–5.11)
RDW: 15.7 % — AB (ref 11.5–15.5)
WBC: 8.9 10*3/uL (ref 4.0–10.5)

## 2016-05-15 MED ORDER — NICOTINE 7 MG/24HR TD PT24
7.0000 mg | MEDICATED_PATCH | Freq: Every day | TRANSDERMAL | Status: DC
Start: 2016-05-15 — End: 2016-05-16
  Administered 2016-05-15 – 2016-05-16 (×2): 7 mg via TRANSDERMAL
  Filled 2016-05-15 (×2): qty 1

## 2016-05-15 MED ORDER — PANTOPRAZOLE SODIUM 40 MG PO TBEC
40.0000 mg | DELAYED_RELEASE_TABLET | Freq: Every day | ORAL | Status: DC
  Administered 2016-05-15 – 2016-05-16 (×2): 40 mg via ORAL
  Filled 2016-05-15 (×2): qty 1

## 2016-05-15 NOTE — Progress Notes (Signed)
OT Cancellation Note  Patient Details Name: Alicia Blake MRN: [id number] DOB: [birth year]   Cancelled Treatment:    Reason Eval/Treat Not Completed: Other (comment) -- Note d/c plan is SNF. Will defer all OT needs to SNF. Thank you.  Sufyaan Palma A 05/15/2016, 12:21 PM

## 2016-05-15 NOTE — Progress Notes (Signed)
   Assessment: 1 Day Post-Op  S/P Procedure(s) (LRB): TOTAL HIP ARTHROPLASTY ANTERIOR APPROACH (Left) by Dr. Ernesta Amble. Percell Miller on 05/14/16  Principal Problem:   Primary osteoarthritis of left hip Active Problems:   Symptomatic Fibroids   Schizoaffective disorder, bipolar type (HCC)   Chronic pain syndrome   Alcohol use disorder, moderate, in sustained remission (HCC)   Cocaine use disorder, moderate, in sustained remission   Cannabis use disorder, moderate, in sustained remission   DDD (degenerative disc disease), lumbar   Hx of fracture of ankle   Opioid use disorder, mild, abuse   Tobacco abuse ADDITIONAL DIAGNOSIS:   Acute Blood loss anemia - likely with dilutional component.    AF.  BP soft, asymptomatic - pain medicines possibly contributing.  +Urine. Difficult pain control over night.  Needing IV and PO meds.  Plan: Advance diet Up with therapy  Wean IV Pain meds as appropriate.  Weight Bearing: Weight Bearing as Tolerated (WBAT) left leg Dressings: prn.  VTE prophylaxis: Aspirin  325mg , SCDs, ambulation Dispo: Home  W/ HHPT or SNF depending on pt progress / assessment - she has little support at home, lives alone on upper floor.  Subjective: Patient reports pain as moderate to severe. Pain controlled with IV and PO meds.  Tolerating diet.  Urinating.  No Flatus.  No Lightheadedness, dizziness, or CP.  Requesting nicotine patch and reflux medicine.  Not yet OOB.  Objective:   VITALS:   Filed Vitals:   05/14/16 1505 05/14/16 2016 05/15/16 0014 05/15/16 0419  BP: 112/75 105/58 108/58 94/57  Pulse: 71 99 108 98  Temp: 97.3 F (36.3 C) 98.7 F (37.1 C) 99.6 F (37.6 C) 98.9 F (37.2 C)  TempSrc:  Oral Oral Oral  Resp: 16 16 18 16   Height:      Weight:      SpO2: 100% 100% 99% 95%    Lab Results  Component Value Date   WBC 8.9 05/15/2016   HGB 9.8* 05/15/2016   HCT 29.6* 05/15/2016   MCV 93.1 05/15/2016   PLT 172 05/15/2016   BMET    Component Value  Date/Time   NA 135 05/15/2016 0441   K 3.8 05/15/2016 0441   CL 102 05/15/2016 0441   CO2 27 05/15/2016 0441   GLUCOSE 140* 05/15/2016 0441   BUN 10 05/15/2016 0441   CREATININE 1.03* 05/15/2016 0441   CALCIUM 8.4* 05/15/2016 0441   GFRNONAA >60 05/15/2016 0441   GFRAA >60 05/15/2016 0441     Physical Exam General: NAD.  Supine in bed.   Neuro: Alert, Conversant/talkative. Pulm: Clear b/l  CV: High normal rate, regular rhythm  MSK: Neurologically intact ABD soft Neurovascular intact Sensation intact distally Dorsiflexion/Plantar flexion intact Incision: dressing C/D/I   Prudencio Burly III 05/15/2016, 6:16 AM

## 2016-05-15 NOTE — Evaluation (Signed)
Physical Therapy Evaluation Patient Details Name: Alicia Blake MRN: [id number] DOB: 1966/05/08 Today's Date: 05/15/2016   History of Present Illness  50 yo female with onset of OA and now has THA with direct anterior approach and WBAT.  PMHx:  PTSD, bipolar disorder, drug use hx, DDD  Clinical Impression  Pt is promising to make fast progress toward SNF discharge, and will follow acutely for gait and there ex as she is in a house with 16 steps to get in.  Her family according to pt will support her transition home when ready, but needs to be independent with steps for transition.    Follow Up Recommendations SNF    Equipment Recommendations  Rolling walker with 5" wheels    Recommendations for Other Services Rehab consult     Precautions / Restrictions Precautions Precautions: Fall Restrictions Weight Bearing Restrictions: Yes LLE Weight Bearing: Weight bearing as tolerated      Mobility  Bed Mobility Overal bed mobility: Needs Assistance Bed Mobility: Supine to Sit     Supine to sit: Min assist;Mod assist     General bed mobility comments: mod to completely shift to side of bed  Transfers Overall transfer level: Needs assistance Equipment used: Rolling walker (2 wheeled);1 person hand held assist Transfers: Sit to/from [facility] Sit to Stand: Mod assist;Min assist Stand pivot transfers: Min assist       General transfer comment: mod to power up and min to steady on walker  Ambulation/Gait Ambulation/Gait assistance: Min assist (close guard) Ambulation Distance (Feet): 35 Feet Assistive device: Rolling walker (2 wheeled);1 person hand held assist Gait Pattern/deviations: Step-through pattern;Step-to pattern;Shuffle;Trunk flexed;Wide base of support;Decreased weight shift to left Gait velocity: reduced Gait velocity interpretation: Below normal speed for age/gender    Stairs            Wheelchair Mobility    Modified Rankin (Stroke  Patients Only)       Balance Overall balance assessment: Needs assistance Sitting-balance support: Feet supported Sitting balance-Leahy Scale: Fair   Postural control: Posterior lean Standing balance support: Bilateral upper extremity supported Standing balance-Leahy Scale: Poor                               Pertinent Vitals/Pain Pain Assessment: 0-10 Pain Score: 7  Pain Location: L hip Pain Descriptors / Indicators: Operative site guarding Pain Intervention(s): Monitored during session;Premedicated before session;Repositioned;Ice applied    Home Living Family/patient expects to be discharged to:: Unsure Living Arrangements: Alone                    Prior Function Level of Independence: Independent with assistive device(s)               Hand Dominance        Extremity/Trunk Assessment   Upper Extremity Assessment: Overall WFL for tasks assessed           Lower Extremity Assessment: LLE deficits/detail   LLE Deficits / Details: THA pain and edema  Cervical / Trunk Assessment: Normal  Communication   Communication: No difficulties  Cognition Arousal/Alertness: Awake/alert Behavior During Therapy: WFL for tasks assessed/performed Overall Cognitive Status: Within Functional Limits for tasks assessed                      General Comments General comments (skin integrity, edema, etc.): Pt is demonstrating some difficulty clearing LLE from floor and to shift onto  that limb.  However, is trying to work and shows promise to prgress through [employer] rehab    Exercises Total Joint Exercises Ankle Circles/Pumps: AROM;Both;10 reps Quad Sets: AROM;Both;10 reps Hip ABduction/ADduction: AAROM;Both;10 reps      Assessment/Plan    PT Assessment Patient needs continued PT services  PT Diagnosis Difficulty walking;Acute pain   PT Problem List Decreased strength;Decreased range of motion;Decreased activity tolerance;Decreased  balance;Decreased mobility;Decreased coordination;Decreased knowledge of use of DME;Decreased safety awareness;Obesity;Decreased skin integrity;Pain  PT Treatment Interventions DME instruction;Gait training;Stair training;Functional mobility training;Therapeutic activities;Therapeutic exercise;Balance training;Neuromuscular re-education;Patient/family education   PT Goals (Current goals can be found in the Care Plan section) Acute Rehab PT Goals Patient Stated Goal: to be able to get into her house PT Goal Formulation: With patient Time For Goal Achievement: 05/29/16 Potential to Achieve Goals: Good    Frequency 7X/week   Barriers to discharge Inaccessible home environment;Decreased caregiver support has 16 steps to enter house with one railing    Co-evaluation               End of Session Equipment Utilized During Treatment: Gait belt Activity Tolerance: Patient tolerated treatment well;Patient limited by pain Patient left: in chair;with call bell/phone within reach;with chair alarm set;with nursing/sitter in room Nurse Communication: Mobility status         Time: [id number] PT Time Calculation (min) (ACUTE ONLY): 36 min   Charges:   PT Evaluation $PT Eval Moderate Complexity: 1 Procedure PT Treatments $Gait Training: 8-22 mins   PT G Codes:        Ramond Dial 2016-06-13, 1:58 PM  Mee Hives, PT MS Acute Rehab Dept. Number: [place] [facility] [place]

## 2016-05-16 LAB — CBC
HCT: 28.9 % — ABNORMAL LOW (ref 36.0–46.0)
Hemoglobin: 9.6 g/dL — ABNORMAL LOW (ref 12.0–15.0)
MCH: 30.7 pg (ref 26.0–34.0)
MCHC: 33.2 g/dL (ref 30.0–36.0)
MCV: 92.3 fL (ref 78.0–100.0)
Platelets: 169 10*3/uL (ref 150–400)
RBC: 3.13 MIL/uL — ABNORMAL LOW (ref 3.87–5.11)
RDW: 15.7 % — ABNORMAL HIGH (ref 11.5–15.5)
WBC: 15.3 10*3/uL — ABNORMAL HIGH (ref 4.0–10.5)

## 2016-05-16 NOTE — Progress Notes (Signed)
Physical Therapy Treatment Patient Details Name: Alicia Blake MRN: [id number] DOB: 1966-04-05 Today's Date: 05/16/2016    History of Present Illness 50 yo female with onset of OA and now has THA with direct anterior approach and WBAT.  PMHx:  PTSD, bipolar disorder, drug use hx, DDD    PT Comments    Patient continues to progress toward PT goals. Pt with improved ability to WB on L LE and with improved bilat foot clearance. Focused on stair training this session and given handout for stair management with use of RW. Mother present end of session and pt and mother feel comfortable managing stairs upon d/c. Reviewed HEP hand out. Pt tolerated session well. Pt will continue to benefit from further skilled PT services to maximize independence and safety with mobility.   Follow Up Recommendations  Home health PT     Equipment Recommendations  Rolling walker with 5" wheels;3in1 (PT)    Recommendations for Other Services       Precautions / Restrictions Precautions Precautions: Fall Restrictions Weight Bearing Restrictions: Yes LLE Weight Bearing: Weight bearing as tolerated    Mobility  Bed Mobility               General bed mobility comments: OOB in chair upon arrival  Transfers Overall transfer level: Needs assistance Equipment used: Rolling walker (2 wheeled);1 person hand held assist Transfers: Sit to/from Stand Sit to Stand: Supervision         General transfer comment: cues for hand placement and use of AD when preparing for descent to recliner  Ambulation/Gait Ambulation/Gait assistance: Supervision Ambulation Distance (Feet): 135 Feet Assistive device: Rolling walker (2 wheeled);1 person hand held assist Gait Pattern/deviations: Step-through pattern;Decreased stance time - left;Decreased stride length;Decreased weight shift to left Gait velocity: reduced   General Gait Details: cues for posture and bilat heel strike; pt with improved ability to WB on L LE  and with improved bilat foot clearance   Stairs Stairs: Yes Stairs assistance: Min guard Stair Management: No rails;Backwards;With walker Number of Stairs: 6 General stair comments: cues for sequencing and technique and safety; min guard for safety and stabilization of RW  Wheelchair Mobility    Modified Rankin (Stroke Patients Only)       Balance Overall balance assessment: Needs assistance   Sitting balance-Leahy Scale: Good       Standing balance-Leahy Scale: Fair Standing balance comment: able to static stand without UE support                    Cognition Arousal/Alertness: Awake/alert Behavior During Therapy: WFL for tasks assessed/performed Overall Cognitive Status: Within Functional Limits for tasks assessed                      Exercises Total Joint Exercises Marching in Standing: AROM;Left;10 reps;Standing    General Comments General comments (skin integrity, edema, etc.): reviewed HEP and given handout for managing stairs with RW; mother present end of session and reviewed stair managment with pt and mother as she will be providing assistance at home      Pertinent Vitals/Pain Pain Assessment: Faces Faces Pain Scale: Hurts little more Pain Location: L hip Pain Descriptors / Indicators: Sore Pain Intervention(s): Limited activity within patient's tolerance;Monitored during session;Repositioned;Patient requesting pain meds-RN notified;Ice applied    Home Living                      Prior Function  PT Goals (current goals can now be found in the care plan section) Acute Rehab PT Goals Patient Stated Goal: go home and get to appointment in the am PT Goal Formulation: With patient Time For Goal Achievement: 05/29/16 Potential to Achieve Goals: Good Progress towards PT goals: Progressing toward goals    Frequency  7X/week    PT Plan Discharge plan needs to be updated    Co-evaluation             End of  Session Equipment Utilized During Treatment: Gait belt Activity Tolerance: Patient tolerated treatment well Patient left: in chair;with call bell/phone within reach;with family/visitor present;with nursing/sitter in room     Time: 1530-1610 PT Time Calculation (min) (ACUTE ONLY): 40 min  Charges:  $Gait Training: 23-37 mins $Therapeutic Activity: 8-22 mins                    G Codes:      [name], PTA Pager: [phone]   05/16/2016, 4:20 PM

## 2016-05-16 NOTE — Progress Notes (Addendum)
   Addendum: Follow up discussion with nursing who has been in recent contact with the physical therapy team.  The patient has made significant progress during the day with regard to her ambulation and ability to navigate stairs.  PT endorses HHPT.  The patient feels confident about her ability to ambulate and care for her needs at home.  She desires d/c to Home w/ HHPT today.  We will plan for same.  HCM, 1649, 05/16/16  Assessment: 2 Days Post-Op  S/P Procedure(s) (LRB): TOTAL HIP ARTHROPLASTY ANTERIOR APPROACH (Left) by Dr. Ernesta Amble. Percell Miller on 05/14/16  Principal Problem:   Primary osteoarthritis of left hip Active Problems:   Symptomatic Fibroids   Schizoaffective disorder, bipolar type (HCC)   Chronic pain syndrome   Alcohol use disorder, moderate, in sustained remission (HCC)   Cocaine use disorder, moderate, in sustained remission   Cannabis use disorder, moderate, in sustained remission   DDD (degenerative disc disease), lumbar   Hx of fracture of ankle   Opioid use disorder, mild, abuse   Tobacco abuse ADDITIONAL DIAGNOSIS:   Acute Blood loss anemia - likely with dilutional component. H/H stable.  AFVSN.  +Urine. Better pain control.  PT progress good.  Plan: Up with therapy.   Wean IV Pain meds as appropriate.  Weight Bearing: Weight Bearing as Tolerated (WBAT) left leg Dressings: prn.  VTE prophylaxis: Aspirin  325mg , SCDs, ambulation Dispo: Home  W/ HHPT or SNF depending on pt progress / assessment - she has little support at home, lives alone on upper floor.  The patient is thinking that she may want to go home w/ HHPT, but admits that primary reason is to be able to attend a disability hearing that is tomorrow 05/17/16 at 10am.    Her progress is good so far, but has not yet meet milestones with PT to be safe at home.  PT progress / eval will help determine this.  The patient verbalizes understanding.    Subjective: Patient reports pain as moderate / controlled.   Tolerating diet.  Urinating.  +Flatus.  No Lightheadedness, dizziness, or CP.    Objective:   VITALS:   Filed Vitals:   05/15/16 0419 05/15/16 1335 05/15/16 1900 05/16/16 0414  BP: 94/57 130/73 125/76 100/67  Pulse: 98 106 105 99  Temp: 98.9 F (37.2 C) 98.1 F (36.7 C) 98.1 F (36.7 C)   TempSrc: Oral Oral Oral   Resp: 16 18    Height:      Weight:      SpO2: 95% 99% 97% 97%    Lab Results  Component Value Date   WBC 15.3* 05/16/2016   HGB 9.6* 05/16/2016   HCT 28.9* 05/16/2016   MCV 92.3 05/16/2016   PLT 169 05/16/2016    Physical Exam General: NAD.  Upright in bed.   Neuro: Alert, Conversant. Pulm: Clear b/l  CV: RRR ABD soft MSK: Neurologically intact Neurovascular intact Sensation intact distally Dorsiflexion/Plantar flexion intact Incision: dressing C/D/I   Prudencio Burly III 05/16/2016, 6:40 AM

## 2016-05-16 NOTE — Care Management Note (Signed)
Case Management Note  Patient Details  Name: JEANNEAN PORTEOUS MRN: [id number] Date of Birth: 18-Apr-1966  Subjective/Objective:     50 yr old female s/p left total hip arthroplasty.               Action/Plan:    Case manager spoke with patient concerning home health and DME needs. Patient was preoperatively setup with [facility] for physical therapy. No changes. DME has been ordered. Patient states her mom will assist her at discharge.    Expected Discharge Date:   05/16/16               Expected Discharge Plan:   Home with Home Health  In-House Referral:     Discharge planning Services  CM Consult  Post Acute Care Choice:  Home Health, Durable Medical Equipment Choice offered to:  Patient  DME Arranged:  3-N-1, Walker rolling DME Agency:  [place]:  PT [place]:  [facility] (now [facility])  Status of Service:  Completed, signed off  If discussed at [employer] of Stay Meetings, dates discussed:    Additional Comments:  Ninfa Meeker, RN 05/16/2016, 12:06 PM

## 2016-05-16 NOTE — Progress Notes (Signed)
Pt and pt's mother given discharge instructions. Pt voices understanding of all instructions. Hard prescriptions and home equipment given. Pt states she is comfortable going home with her mother and feels she is able to climb stairs after working with PT here in the hospital.  All belongings with pt at discharge. Transported home  via mother's vehicle.

## 2016-05-16 NOTE — Progress Notes (Signed)
Physical Therapy Treatment Patient Details Name: Alicia Blake MRN: [id number] DOB: Mar 07, 1966 Today's Date: 05/16/2016    History of Present Illness 50 yo female with onset of OA and now has THA with direct anterior approach and WBAT.  PMHx:  PTSD, bipolar disorder, drug use hx, DDD    PT Comments    Patient is progressing toward mobility goals. Tolerated gait/stair training this session. Pt continues to have difficulty clearing floor with L foot at times however demonstrated improvement. Continue to progress as tolerated.   Follow Up Recommendations  SNF     Equipment Recommendations  Rolling walker with 5" wheels    Recommendations for Other Services       Precautions / Restrictions Precautions Precautions: Fall Restrictions Weight Bearing Restrictions: Yes LLE Weight Bearing: Weight bearing as tolerated    Mobility  Bed Mobility Overal bed mobility: Needs Assistance Bed Mobility: Supine to Sit     Supine to sit: Supervision;HOB elevated     General bed mobility comments: supervision for safety; increased time and HOB elevated  Transfers Overall transfer level: Needs assistance Equipment used: Rolling walker (2 wheeled);1 person hand held assist Transfers: Sit to/from Stand Sit to Stand: Min guard         General transfer comment: cues for hand placement and safe use of AD  Ambulation/Gait Ambulation/Gait assistance: Supervision Ambulation Distance (Feet): 100 Feet Assistive device: Rolling walker (2 wheeled);1 person hand held assist Gait Pattern/deviations: Step-through pattern;Decreased weight shift to left;Decreased stance time - left;Decreased step length - right;Antalgic;Trunk flexed Gait velocity: reduced   General Gait Details: cues for sequencing, posture, increased WB on L LE, and bilat heel strike; pt with improved step symmetry and WB on L LE with increased distance   Stairs Stairs: Yes Stairs assistance: Min guard Stair Management: One  rail Left;Sideways Number of Stairs: 8 General stair comments: cues for sequencing and technique; min guard for safety  Wheelchair Mobility    Modified Rankin (Stroke Patients Only)       Balance     Sitting balance-Leahy Scale: Fair     Standing balance support: Bilateral upper extremity supported;During functional activity Standing balance-Leahy Scale: Poor                      Cognition Arousal/Alertness: Awake/alert Behavior During Therapy: WFL for tasks assessed/performed Overall Cognitive Status: Within Functional Limits for tasks assessed                      Exercises Total Joint Exercises Quad Sets: AROM;Both;10 reps;Seated Gluteal Sets: AROM;Both;10 reps;Seated Heel Slides: AROM;Left;10 reps;Seated Hip ABduction/ADduction: 10 reps;AROM;Left;Seated    General Comments        Pertinent Vitals/Pain Pain Assessment: 0-10 Pain Score: 7  Pain Location: L hip Pain Descriptors / Indicators: Guarding;Sore;Tightness Pain Intervention(s): Limited activity within patient's tolerance;Monitored during session;Premedicated before session;Repositioned;Ice applied    Home Living                      Prior Function            PT Goals (current goals can now be found in the care plan section) Acute Rehab PT Goals Patient Stated Goal: go home and get to appointment in the am PT Goal Formulation: With patient Time For Goal Achievement: 05/29/16 Potential to Achieve Goals: Good Progress towards PT goals: Progressing toward goals    Frequency  7X/week    PT Plan Current plan remains appropriate  Co-evaluation             End of Session Equipment Utilized During Treatment: Gait belt Activity Tolerance: Patient tolerated treatment well Patient left: in chair;with call bell/phone within reach;with chair alarm set     Time: [id number] PT Time Calculation (min) (ACUTE ONLY): 33 min  Charges:  $Gait Training: 8-22  mins $Therapeutic Exercise: 8-22 mins                    G Codes:      [name], PTA Pager: [phone]   05/16/2016, 11:44 AM

## 2016-05-23 NOTE — Discharge Summary (Signed)
Discharge Summary  Patient ID: Alicia Blake MRN: [id number] DOB/AGE: 1966/09/28 50 y.o.  Admit date: 05/14/2016 Discharge date: 05/23/2016  Admission Diagnoses:  Primary osteoarthritis of left hip  Discharge Diagnoses:  Principal Problem:   Primary osteoarthritis of left hip Active Problems:   Symptomatic Fibroids   Schizoaffective disorder, bipolar type (HCC)   Chronic pain syndrome   Alcohol use disorder, moderate, in sustained remission (HCC)   Cocaine use disorder, moderate, in sustained remission   Cannabis use disorder, moderate, in sustained remission   DDD (degenerative disc disease), lumbar   Hx of fracture of ankle   Opioid use disorder, mild, abuse   Tobacco abuse   Past Medical History:  Diagnosis Date  . Asthma   . Bipolar affect, depressed ([place])   . Bronchitis   . Chronic back pain   . COPD (chronic obstructive pulmonary disease) ([place])   . Diabetes mellitus without complication ([place])    DIET CONTROLLED entered by office  . DJD (degenerative joint disease)    BACK  . GERD (gastroesophageal reflux disease)   . Hypertension   . Pre-diabetes    dr entered diabetes without complications- no meds does not test sugar at home  . PTSD (post-traumatic stress disorder)   . Scoliosis   . Seizures ([place])    "when drinking" last yrs ago  . Shortness of breath dyspnea    exersion  . Thyroid disease   . UTI (lower urinary tract infection)     Surgeries: Procedure(s): TOTAL HIP ARTHROPLASTY ANTERIOR APPROACH on 05/14/2016   Consultants (if any):   Discharged Condition: Improved  Hospital Course: Alicia Blake is an 50 y.o. female who was admitted 05/14/2016 with a diagnosis of Primary osteoarthritis of left hip and went to the operating room on 05/14/2016 and underwent the above named procedures.    She was given perioperative antibiotics:  Anti-infectives    Start     Dose/Rate Route Frequency Ordered Stop   05/14/16 1700  ceFAZolin (ANCEF) IVPB 2g/100 mL  premix     2 g 200 mL/hr over 30 Minutes Intravenous Every 6 hours 05/14/16 1550 05/15/16 0003   05/14/16 0930  ceFAZolin (ANCEF) IVPB 2g/100 mL premix     2 g 200 mL/hr over 30 Minutes Intravenous To ShortStay Surgical 05/13/16 1054 05/14/16 1120    .  She was given sequential compression devices, early ambulation, and ASA for DVT prophylaxis.  She benefited maximally from the hospital stay and there were no complications.    Recent vital signs:  Vitals:   05/16/16 0414 05/16/16 1300  BP: 100/67 109/63  Pulse: 99 (!) 106  Resp:  18  Temp:  97.3 F (36.3 C)    Recent laboratory studies:  Lab Results  Component Value Date   HGB 9.6 (L) 05/16/2016   HGB 9.8 (L) 05/15/2016   HGB 11.6 (L) 05/07/2016   Lab Results  Component Value Date   WBC 15.3 (H) 05/16/2016   PLT 169 05/16/2016   Lab Results  Component Value Date   INR 1.01 05/07/2016   Lab Results  Component Value Date   NA 135 05/15/2016   K 3.8 05/15/2016   CL 102 05/15/2016   CO2 27 05/15/2016   BUN 10 05/15/2016   CREATININE 1.03 (H) 05/15/2016   GLUCOSE 140 (H) 05/15/2016    Discharge Medications:     Medication List    TAKE these medications   acetaminophen 325 MG tablet Commonly known as:  TYLENOL Take 650  mg by mouth every 6 (six) hours as needed for mild pain.   albuterol 108 (90 Base) MCG/ACT inhaler Commonly known as:  PROVENTIL HFA;VENTOLIN HFA Inhale 2 puffs into the lungs every 6 (six) hours as needed for wheezing.   aspirin EC 325 MG tablet Take 1 tablet (325 mg total) by mouth daily. What changed:  medication strength  how much to take  when to take this   divalproex 500 MG DR tablet Commonly known as:  DEPAKOTE Take 1 tablet (500 mg total) by mouth every 12 (twelve) hours.   docusate sodium 100 MG capsule Commonly known as:  COLACE Take 100 mg by mouth daily as needed for mild constipation. What changed:  Another medication with the same name was added. Make sure you  understand how and when to take each.   docusate sodium 100 MG capsule Commonly known as:  COLACE Take 1 capsule (100 mg total) by mouth 2 (two) times daily. What changed:  You were already taking a medication with the same name, and this prescription was added. Make sure you understand how and when to take each.   furosemide 20 MG tablet Commonly known as:  LASIX Take 40 mg by mouth daily.   hydrOXYzine 25 MG tablet Commonly known as:  ATARAX/VISTARIL Take 1 tablet (25 mg total) by mouth every 6 (six) hours as needed for anxiety. What changed:  how much to take  when to take this   ibuprofen 800 MG tablet Commonly known as:  ADVIL,MOTRIN Take 1 tablet (800 mg total) by mouth 3 (three) times daily.   Lidocaine HCl 2 % Liqd Apply 1 application topically as needed (for pain).   lisinopril 10 MG tablet Commonly known as:  PRINIVIL,ZESTRIL Take 1 tablet (10 mg total) by mouth daily.   methocarbamol 500 MG tablet Commonly known as:  ROBAXIN Take 1 tablet (500 mg total) by mouth every 6 (six) hours as needed for muscle spasms. What changed:  how much to take  when to take this  reasons to take this   multivitamin tablet Take 1 tablet by mouth daily.   omeprazole 20 MG capsule Commonly known as:  PRILOSEC Take 1 capsule (20 mg total) by mouth daily.   ondansetron 4 MG tablet Commonly known as:  ZOFRAN Take 1 tablet (4 mg total) by mouth every 8 (eight) hours as needed for nausea or vomiting.   oxyCODONE-acetaminophen 5-325 MG tablet Commonly known as:  ROXICET Take 1-2 tablets by mouth every 4 (four) hours as needed for severe pain.   POTASSIUM PO Take 1 tablet by mouth every other day. Reported on 05/02/2016   PRESCRIPTION MEDICATION Apply 1 application topically daily as needed. 2% Diltiazem gel for anal fissures   QUEtiapine 300 MG tablet Commonly known as:  SEROQUEL Take 1 tablet (300 mg total) by mouth at bedtime.   ranitidine 150 MG tablet Commonly  known as:  ZANTAC Take 150 mg by mouth 1 day or 1 dose.   tiotropium 18 MCG inhalation capsule Commonly known as:  SPIRIVA Place 18 mcg into inhaler and inhale every morning.   tolterodine 4 MG 24 hr capsule Commonly known as:  DETROL LA Take 4 mg by mouth daily.       Diagnostic Studies: Nm Myocar Multi W/spect W/wall Motion / Ef  Result Date: 04/25/2016  There was no ST segment deviation noted during stress.  No T wave inversion was noted during stress.  The study is normal.  The left ventricular  ejection fraction is hyperdynamic (>65%).  Nuclear stress EF: 72%.  1. No ischemia or infarction by perfusion images.  n 2. Normal EF and wall motion.   Dg C-arm 1-60 Min  Result Date: 05/14/2016 CLINICAL DATA:  Patient status post left hip arthroplasty. EXAM: OPERATIVE LEFT HIP (WITH PELVIS IF PERFORMED) 2 VIEWS TECHNIQUE: Fluoroscopic spot image(s) were submitted for interpretation post-operatively. COMPARISON:  None. FINDINGS: Two intraoperative fluoroscopic images of the left hip were submitted. Patient status post left hip arthroplasty. Hardware appears intact. IMPRESSION: Patient status post left hip arthroplasty.  No acute process. Electronically Signed   By: Lovey Newcomer M.D.   On: 05/14/2016 13:30   Dg Hip Operative Unilat W Or W/o Pelvis Left  Result Date: 05/14/2016 CLINICAL DATA:  Patient status post left hip arthroplasty. EXAM: OPERATIVE LEFT HIP (WITH PELVIS IF PERFORMED) 2 VIEWS TECHNIQUE: Fluoroscopic spot image(s) were submitted for interpretation post-operatively. COMPARISON:  None. FINDINGS: Two intraoperative fluoroscopic images of the left hip were submitted. Patient status post left hip arthroplasty. Hardware appears intact. IMPRESSION: Patient status post left hip arthroplasty.  No acute process. Electronically Signed   By: Lovey Newcomer M.D.   On: 05/14/2016 13:30    Disposition: 01-Home or Self Care    Follow-up Information    MURPHY, TIMOTHY D, MD In 2 weeks.    Specialty:  Orthopedic Surgery Contact information: [address] [place] [address] [phone]        [facility].   Why:  Someone from [facility] will contact you to arrange start date and time for therapy  Contact information: [place] [address] [phone]            Signed: Prudencio Burly III PA-C 05/23/2016, 9:26 AM

## 2016-10-14 DIAGNOSIS — H9313 Tinnitus, bilateral: Secondary | ICD-10-CM | POA: Insufficient documentation

## 2016-10-14 DIAGNOSIS — H903 Sensorineural hearing loss, bilateral: Secondary | ICD-10-CM | POA: Insufficient documentation

## 2016-11-12 ENCOUNTER — Encounter: Payer: Self-pay | Admitting: Physical Therapy

## 2016-11-12 ENCOUNTER — Ambulatory Visit: Payer: Medicaid Other | Attending: Orthopedic Surgery | Admitting: Physical Therapy

## 2016-11-12 DIAGNOSIS — M5442 Lumbago with sciatica, left side: Secondary | ICD-10-CM | POA: Diagnosis present

## 2016-11-12 DIAGNOSIS — M5441 Lumbago with sciatica, right side: Secondary | ICD-10-CM | POA: Insufficient documentation

## 2016-11-12 DIAGNOSIS — G8929 Other chronic pain: Secondary | ICD-10-CM | POA: Diagnosis present

## 2016-11-12 NOTE — Therapy (Signed)
[place], [place], [address] Phone: [phone]   Fax:  [phone]  Physical Therapy Evaluation  Patient Details  Name: Alicia Blake MRN: [id number] Date of Birth: Dec 05, 1965 Referring Provider: Dr. Percell Miller  Encounter Date: 11/12/2016      PT End of Session - 11/12/16 1210    Visit Number 1   Number of Visits 1   Authorization Type medicaid, 1 visit limitation   PT Start Time 1122   PT Stop Time 1210   PT Time Calculation (min) 48 min   Activity Tolerance Patient tolerated treatment well   Behavior During Therapy [facility] for tasks assessed/performed      Past Medical History:  Diagnosis Date  . Asthma   . Bipolar affect, depressed ([place])   . Bronchitis   . Chronic back pain   . COPD (chronic obstructive pulmonary disease) ([place])   . Diabetes mellitus without complication ([place])    DIET CONTROLLED entered by office  . DJD (degenerative joint disease)    BACK  . GERD (gastroesophageal reflux disease)   . Hypertension   . Pre-diabetes    dr entered diabetes without complications- no meds does not test sugar at home  . PTSD (post-traumatic stress disorder)   . Scoliosis   . Seizures ([place])    "when drinking" last yrs ago  . Shortness of breath dyspnea    exersion  . Thyroid disease   . UTI (lower urinary tract infection)     Past Surgical History:  Procedure Laterality Date  . 2 LEFT TOES SURGERY  MARCH 2016   right also hammer toes  . BALLOON DILATION N/A 12/14/2015   Procedure: BALLOON DILATION;  Surgeon: Teena Irani, MD;  Location: [facility];  Service: Endoscopy;  Laterality: N/A;  . COLONOSCOPY WITH PROPOFOL N/A 12/14/2015   Procedure: COLONOSCOPY WITH PROPOFOL;  Surgeon: Teena Irani, MD;  Location: [facility];  Service: Endoscopy;  Laterality: N/A;  . ESOPHAGOGASTRODUODENOSCOPY (EGD) WITH PROPOFOL N/A 12/14/2015   Procedure: ESOPHAGOGASTRODUODENOSCOPY (EGD) WITH PROPOFOL;  Surgeon: Teena Irani, MD;  Location: [facility];  Service: Endoscopy;  Laterality: N/A;  . LEFT ANKLE SURGERY  MARCH 2016  . NECK SURGERY  AS TEENAGER   STITCHES TO NECK   . SURGERY FOR CUT ON STOMACH  TEENAGER  . TOTAL HIP ARTHROPLASTY Left 05/14/2016   Procedure: TOTAL HIP ARTHROPLASTY ANTERIOR APPROACH;  Surgeon: Renette Butters, MD;  Location: [facility];  Service: Orthopedics;  Laterality: Left;    There were no vitals filed for this visit.       Subjective Assessment - 11/12/16 1130    Subjective Pt arriving to therapy complaining of low back pain that has been on-going since 2009 and has gradually worsened. Pt reporting pain radiating down both LE's. Pt reporting difficutly sleeping and inability to take her medicaitons due to funding.    Pertinent History left hip replacement in May 14 2016   Limitations House hold activities;Walking;Standing;Lifting   How long can you sit comfortably? 20 minutes   How long can you stand comfortably? 10 minutes   How long can you walk comfortably? 5 minutes   Diagnostic tests X-ray at Dr. Debroah Loop office   Patient Stated Goals Stop hurting, move better, walk more without pain to reach bus stop   Currently in Pain? Yes   Pain Score 8    Pain Location Back   Pain Orientation Lower;Medial   Pain Descriptors / Indicators Aching;Burning;Tightness   Pain Radiating  Towards down both legs   Pain Onset More than a month ago   Pain Frequency Constant   Aggravating Factors  bending, stress, positioning   Pain Relieving Factors ice, heat   Effect of Pain on Daily Activities Pt reporting increased stress, difficutly doing household chores   Multiple Pain Sites No            [facility] PT Assessment - 11/12/16 0001      Assessment   Medical Diagnosis Right lumbar spondylosis and left leg paresthesis, L2-3 and L3-4 and L4-5 disc bulges   Referring Provider Dr. Percell Miller   Onset Date/Surgical Date --  years ago   Hand Dominance Right   Prior Therapy no      Precautions   Precautions None     Balance Screen   Has the patient fallen in the past 6 months --  tripped but didn't hit the floor   Has the patient had a decrease in activity level because of a fear of falling?  Yes     [place] residence   Living Arrangements Alone   Available Help at Discharge Family;Personal care attendant  CNA comes 1-2 hours every morning   Type of Home Apartment   Home Access Stairs to enter   Entrance Stairs-Number of Steps 16   Entrance Stairs-Rails Right   Home Layout One level   [place] None     Prior Function   Level of Independence Independent   Vocation On disability   Leisure read, waching movies, cooking     Cognition   Overall Cognitive Status Within Functional Limits for tasks assessed   Behaviors --  Pt reporting history of bipolar disorder     Coordination   Heel Shin Test intact     Functional Tests   Functional tests Sit to Stand     Posture/Postural Control   Posture/Postural Control Postural limitations   Posture Comments increased lumbar lordosis     ROM / Strength   AROM / PROM / Strength AROM;Strength     AROM   Overall AROM Comments R Hamstring: 60 degrees, Left Hamstring: 45 degrees, pt limited by low back pain bilaterally   AROM Assessment Site Lumbar   Lumbar Flexion 50   Lumbar Extension 10   Lumbar - Right Side Bend 12   Lumbar - Left Side Bend 15   Lumbar - Right Rotation limited hip rotation   Lumbar - Left Rotation limited hip rotation     Strength   Overall Strength Within functional limits for tasks performed   Overall Strength Comments bilalteral LE's grossly 5/5     Transfers   Five time sit to stand comments  13.4 seconds no UE support     Ambulation/Gait   Ambulation/Gait Yes   Ambulation Distance (Feet) 75 Feet   Assistive device None   Gait Pattern Decreased trunk rotation;Wide base of support   Gait Comments wide BOS with decreased hip extension  bilaterally                           PT Education - 11/12/16 1209    Education provided Yes   Education Details Edu on anatomy of spine and explained dx. Edu pt in HEP and posture correction   Person(s) Educated Patient   Methods Explanation;Demonstration;Handout;Verbal cues;Tactile cues   Comprehension Verbalized understanding;Returned demonstration          PT Short Term Goals -  11/12/16 1215      PT SHORT TERM GOAL #1   Title Independent with HEP   Status Achieved                  Plan - 11/12/16 1211    Clinical Impression Statement Pt is a low complexity evaluation presenting with 8/10 low back pain which radiates down bilateal LE's. Pt with dx of Right Spondylosis and left leg paresthesis. L2-3, L3-4, L4-5 disc bulges. Pt reporting her pain began in 2009 and has worsened since 2013. Pt reporting epsiode of tripping, but didn't hit the floor. Pt with intact bilateral LE strength, but limited hamstring mobility with positive SLR bilaterally. Pt with increased lumbar lordosis and wide BOS during amb. Pt instructed in posture correction and HEP for LBP. Due to financial restraints and insurance restrictions pt will only be seen for today's therapy visit.    Rehab Potential Good   PT Next Visit Plan pt limited by financial restraints for follow up visits   PT Home Exercise Plan PPT, bridges, hamstring stretch/sciatic nerve stretch, supine marching, SKTC   Consulted and Agree with Plan of Care Patient      Patient will benefit from skilled therapeutic intervention in order to improve the following deficits and impairments:  Postural dysfunction, Improper body mechanics, Decreased range of motion, Decreased mobility, Pain, Difficulty walking, Decreased activity tolerance, Obesity  Visit Diagnosis: Chronic midline low back pain with bilateral sciatica - Plan: PT plan of care cert/re-cert     Problem List Patient Active Problem List   Diagnosis  Date Noted  . Tobacco abuse 04/19/2016  . Primary osteoarthritis of left hip 04/18/2016  . Opioid use disorder, mild, abuse 09/08/2015  . Hypokalemia 09/07/2015  . Schizoaffective disorder, bipolar type ([place]) 09/07/2015  . Chronic pain syndrome 09/07/2015  . Alcohol use disorder, moderate, in sustained remission ([place]) 09/07/2015  . Cocaine use disorder, moderate, in sustained remission ([place]) 09/07/2015  . Cannabis use disorder, moderate, in sustained remission ([place]) 09/07/2015  . DDD (degenerative disc disease), lumbar 09/07/2015  . Hx of fracture of ankle 09/07/2015  . Symptomatic Fibroids 08/09/2013  . Breast lump on left side at 9 o'clock position 06/29/2013  . Breast discharge 06/29/2013    Oretha Caprice, MPT 11/12/2016, 12:35 PM  [facility] [address] [place], [place], [address] Phone: [phone]   Fax:  [phone]  Name: Alicia Blake MRN: [id number] Date of Birth: Mar 26, 1966

## 2016-12-03 ENCOUNTER — Other Ambulatory Visit: Payer: Self-pay | Admitting: Internal Medicine

## 2016-12-03 DIAGNOSIS — N644 Mastodynia: Secondary | ICD-10-CM

## 2017-01-02 ENCOUNTER — Emergency Department (HOSPITAL_COMMUNITY)
Admission: EM | Admit: 2017-01-02 | Discharge: 2017-01-02 | Disposition: A | Discharge status: Home / Self Care | Payer: Medicaid Other | Attending: Emergency Medicine | Admitting: Emergency Medicine

## 2017-01-02 ENCOUNTER — Encounter (HOSPITAL_COMMUNITY): Payer: Self-pay

## 2017-01-02 ENCOUNTER — Emergency Department (HOSPITAL_COMMUNITY): Payer: Medicaid Other

## 2017-01-02 DIAGNOSIS — M545 Low back pain, unspecified: Secondary | ICD-10-CM

## 2017-01-02 DIAGNOSIS — I10 Essential (primary) hypertension: Secondary | ICD-10-CM | POA: Insufficient documentation

## 2017-01-02 DIAGNOSIS — J069 Acute upper respiratory infection, unspecified: Secondary | ICD-10-CM

## 2017-01-02 DIAGNOSIS — J449 Chronic obstructive pulmonary disease, unspecified: Secondary | ICD-10-CM | POA: Insufficient documentation

## 2017-01-02 DIAGNOSIS — Z96642 Presence of left artificial hip joint: Secondary | ICD-10-CM | POA: Insufficient documentation

## 2017-01-02 DIAGNOSIS — Z79899 Other long term (current) drug therapy: Secondary | ICD-10-CM | POA: Diagnosis not present

## 2017-01-02 DIAGNOSIS — E119 Type 2 diabetes mellitus without complications: Secondary | ICD-10-CM | POA: Diagnosis not present

## 2017-01-02 DIAGNOSIS — Z7982 Long term (current) use of aspirin: Secondary | ICD-10-CM | POA: Diagnosis not present

## 2017-01-02 DIAGNOSIS — F17210 Nicotine dependence, cigarettes, uncomplicated: Secondary | ICD-10-CM | POA: Insufficient documentation

## 2017-01-02 DIAGNOSIS — G8929 Other chronic pain: Secondary | ICD-10-CM | POA: Diagnosis not present

## 2017-01-02 DIAGNOSIS — R05 Cough: Secondary | ICD-10-CM | POA: Diagnosis present

## 2017-01-02 MED ORDER — ALBUTEROL SULFATE (2.5 MG/3ML) 0.083% IN NEBU
5.0000 mg | INHALATION_SOLUTION | Freq: Once | RESPIRATORY_TRACT | Status: AC
  Administered 2017-01-02: 5 mg via RESPIRATORY_TRACT
  Filled 2017-01-02: qty 6

## 2017-01-02 MED ORDER — BENZONATATE 100 MG PO CAPS: 200.0000 mg | ORAL_CAPSULE | Freq: Two times a day (BID) | ORAL | 0 refills | Status: DC | PRN

## 2017-01-02 MED ORDER — METHOCARBAMOL 500 MG PO TABS: 500.0000 mg | ORAL_TABLET | Freq: Two times a day (BID) | ORAL | 0 refills | Status: DC

## 2017-01-02 MED ORDER — KETOROLAC TROMETHAMINE 30 MG/ML IJ SOLN
30.0000 mg | Freq: Once | INTRAMUSCULAR | Status: AC
  Administered 2017-01-02: 30 mg via INTRAMUSCULAR
  Filled 2017-01-02: qty 1

## 2017-01-02 MED ORDER — IBUPROFEN 600 MG PO TABS: 600.0000 mg | ORAL_TABLET | Freq: Four times a day (QID) | ORAL | 0 refills | Status: DC | PRN

## 2017-01-02 MED ORDER — OXYMETAZOLINE HCL 0.05 % NA SOLN: 1.0000 | Freq: Two times a day (BID) | NASAL | 0 refills | Status: DC

## 2017-01-02 NOTE — ED Provider Notes (Signed)
[facility] DEPT Provider Note   CSN: [id number] Arrival date & time: 01/02/17  1707  By signing my name below, I, Alicia Blake, attest that this documentation has been prepared under the direction and in the presence of [name], [place]. Electronically Signed: Gwenlyn Blake, ED Scribe. 01/02/17. 7:58 PM.  History   Chief Complaint Chief Complaint  Patient presents with  . chronic back pain  . Cough   The history is provided by the patient. No language interpreter was used.   HPI Comments: Alicia Blake is a 51 y.o. female who presents to the Emergency Department complaining of chronic, gradual worsening, constant, moderate lower back pain for a few months, but significantly worsening over the last week. Lower back pain originally began following left hip replacement in 04/2016 for DDD. Pain occasionally radiates down to her knees bilaterally. She recently carried some heavy items up the stairs prior to onset of pain.  She reports associated intermittent mild left lower extremity numbness, and bilateral lower leg swelling. Pt notes numbness began shortly after surgery and has been chronic and unchanged. No treatments tried. Pt has been ambulating with a cane at home. No PMHx of Cancer. She reports seeing the chiropractor last week. She denies recent hx of IV drug use. She denies groin numbness and recent fall.   She also complains of gradual onset, persistent, productive cough. She reports associated chest congestion, sore throat, fever, sinus pressure and nasal congestion. No treatments tried at home, but breathing treatment in ED provided significant relief. She is a current smoker. Denies fever, headache, neck stiffness, facial/neck swelling, hemoptysis, wheezing, shortness of breath, chest pain, abdominal pain, vomiting. Denies any known sick contacts.   Past Medical History:  Diagnosis Date  . Asthma   . Bipolar affect, depressed ([place])   . Bronchitis   . Chronic back pain   .  COPD (chronic obstructive pulmonary disease) ([place])   . Diabetes mellitus without complication ([place])    DIET CONTROLLED entered by office  . DJD (degenerative joint disease)    BACK  . GERD (gastroesophageal reflux disease)   . Hypertension   . Pre-diabetes    dr entered diabetes without complications- no meds does not test sugar at home  . PTSD (post-traumatic stress disorder)   . Scoliosis   . Seizures ([place])    "when drinking" last yrs ago  . Shortness of breath dyspnea    exersion  . Thyroid disease   . UTI (lower urinary tract infection)     Patient Active Problem List   Diagnosis Date Noted  . Tobacco abuse 04/19/2016  . Primary osteoarthritis of left hip 04/18/2016  . Opioid use disorder, mild, abuse 09/08/2015  . Hypokalemia 09/07/2015  . Schizoaffective disorder, bipolar type ([place]) 09/07/2015  . Chronic pain syndrome 09/07/2015  . Alcohol use disorder, moderate, in sustained remission ([place]) 09/07/2015  . Cocaine use disorder, moderate, in sustained remission ([place]) 09/07/2015  . Cannabis use disorder, moderate, in sustained remission ([place]) 09/07/2015  . DDD (degenerative disc disease), lumbar 09/07/2015  . Hx of fracture of ankle 09/07/2015  . Symptomatic Fibroids 08/09/2013  . Breast lump on left side at 9 o'clock position 06/29/2013  . Breast discharge 06/29/2013    Past Surgical History:  Procedure Laterality Date  . 2 LEFT TOES SURGERY  MARCH 2016   right also hammer toes  . BALLOON DILATION N/A 12/14/2015   Procedure: BALLOON DILATION;  Surgeon: Teena Irani, MD;  Location: [facility] ENDOSCOPY;  Service: Endoscopy;  Laterality: N/A;  . COLONOSCOPY WITH PROPOFOL N/A 12/14/2015   Procedure: COLONOSCOPY WITH PROPOFOL;  Surgeon: Teena Irani, MD;  Location: [facility] ENDOSCOPY;  Service: Endoscopy;  Laterality: N/A;  . ESOPHAGOGASTRODUODENOSCOPY (EGD) WITH PROPOFOL N/A 12/14/2015   Procedure: ESOPHAGOGASTRODUODENOSCOPY (EGD) WITH PROPOFOL;  Surgeon: Teena Irani, MD;  Location: [facility]  ENDOSCOPY;  Service: Endoscopy;  Laterality: N/A;  . LEFT ANKLE SURGERY  MARCH 2016  . NECK SURGERY  AS TEENAGER   STITCHES TO NECK   . SURGERY FOR CUT ON STOMACH  TEENAGER  . TOTAL HIP ARTHROPLASTY Left 05/14/2016   Procedure: TOTAL HIP ARTHROPLASTY ANTERIOR APPROACH;  Surgeon: Renette Butters, MD;  Location: [facility];  Service: Orthopedics;  Laterality: Left;    OB History    Gravida Para Term Preterm AB Living   1 1 1     1    SAB TAB Ectopic Multiple Live Births                   Home Medications    Prior to Admission medications   Medication Sig Start Date End Date Taking? Authorizing Provider  acetaminophen (TYLENOL) 325 MG tablet Take 650 mg by mouth every 6 (six) hours as needed for mild pain.    Historical Provider, MD  albuterol (PROVENTIL HFA;VENTOLIN HFA) 108 (90 BASE) MCG/ACT inhaler Inhale 2 puffs into the lungs every 6 (six) hours as needed for wheezing.    Historical Provider, MD  aspirin EC 325 MG tablet Take 1 tablet (325 mg total) by mouth daily. 05/14/16   Renette Butters, MD  benzonatate (TESSALON) 100 MG capsule Take 2 capsules (200 mg total) by mouth 2 (two) times daily as needed for cough. 01/02/17   Nona Dell, PA-C  divalproex (DEPAKOTE) 500 MG DR tablet Take 1 tablet (500 mg total) by mouth every 12 (twelve) hours. 09/12/15   Benjamine Mola, FNP  docusate sodium (COLACE) 100 MG capsule Take 100 mg by mouth daily as needed for mild constipation.     Historical Provider, MD  docusate sodium (COLACE) 100 MG capsule Take 1 capsule (100 mg total) by mouth 2 (two) times daily. 05/14/16   Renette Butters, MD  furosemide (LASIX) 20 MG tablet Take 40 mg by mouth daily.     Historical Provider, MD  hydrOXYzine (ATARAX/VISTARIL) 25 MG tablet Take 1 tablet (25 mg total) by mouth every 6 (six) hours as needed for anxiety. Patient taking differently: Take 50 mg by mouth 3 (three) times daily as needed for anxiety.  09/12/15   Benjamine Mola, FNP  ibuprofen  (ADVIL,MOTRIN) 600 MG tablet Take 1 tablet (600 mg total) by mouth every 6 (six) hours as needed. 01/02/17   Nona Dell, PA-C  Lidocaine HCl 2 % LIQD Apply 1 application topically as needed (for pain).    Historical Provider, MD  lisinopril (PRINIVIL,ZESTRIL) 10 MG tablet Take 1 tablet (10 mg total) by mouth daily. Patient not taking: Reported on 11/12/2016 09/12/15   Benjamine Mola, FNP  methocarbamol (ROBAXIN) 500 MG tablet Take 1 tablet (500 mg total) by mouth 2 (two) times daily. 01/02/17   Nona Dell, PA-C  Multiple Vitamin (MULTIVITAMIN) tablet Take 1 tablet by mouth daily.    Historical Provider, MD  omeprazole (PRILOSEC) 20 MG capsule Take 1 capsule (20 mg total) by mouth daily. 05/14/16   Renette Butters, MD  ondansetron (ZOFRAN) 4 MG tablet Take 1 tablet (4 mg total) by mouth every 8 (eight) hours as  needed for nausea or vomiting. Patient not taking: Reported on 11/12/2016 05/14/16   Renette Butters, MD  oxyCODONE-acetaminophen (ROXICET) 5-325 MG tablet Take 1-2 tablets by mouth every 4 (four) hours as needed for severe pain. Patient not taking: Reported on 11/12/2016 05/14/16   Renette Butters, MD  oxymetazoline [facility] NASAL SPRAY) 0.05 % nasal spray Place 1 spray into both nostrils 2 (two) times daily. Spray once into each nostril twice daily for up to the next 3 days. Do not use for more than 3 days to prevent rebound rhinorrhea. 01/02/17   Nona Dell, PA-C  POTASSIUM PO Take 1 tablet by mouth every other day. Reported on 05/02/2016    Historical Provider, MD  PRESCRIPTION MEDICATION Apply 1 application topically daily as needed. 2% Diltiazem gel for anal fissures    Historical Provider, MD  QUEtiapine (SEROQUEL) 300 MG tablet Take 1 tablet (300 mg total) by mouth at bedtime. 09/12/15   Benjamine Mola, FNP  ranitidine (ZANTAC) 150 MG tablet Take 150 mg by mouth 1 day or 1 dose.    Historical Provider, MD  tiotropium (SPIRIVA) 18 MCG inhalation capsule Place 18  mcg into inhaler and inhale every morning.     Historical Provider, MD  tolterodine (DETROL LA) 4 MG 24 hr capsule Take 4 mg by mouth daily.    Historical Provider, MD    Family History Family History  Problem Relation Age of Onset  . Hypertension Mother   . Hypertension Maternal Grandmother   . Hypertension Paternal Grandmother   . Diabetes Paternal Grandmother   . Hypertension Paternal Grandfather   . Diabetes Paternal Grandfather   . Alcohol abuse Paternal Aunt   . Mental illness Cousin   . Heart attack Cousin   . Heart attack Maternal Uncle     Social History Social History  Substance Use Topics  . Smoking status: Current Every Day Smoker    Packs/day: 0.25    Types: Cigarettes  . Smokeless tobacco: Never Used  . Alcohol use No     Comment: quit date 01/17/2013     Allergies   Buspirone; Crab [shellfish allergy]; Lithium; Other; Meloxicam; and Gabapentin   Review of Systems Review of Systems  Constitutional: Positive for fever.  HENT: Positive for congestion, sinus pressure and sore throat.   Respiratory: Positive for cough and chest tightness.   Cardiovascular: Positive for leg swelling.  Musculoskeletal: Positive for back pain.  Neurological: Positive for numbness.       No groin numbness    Physical Exam Updated Vital Signs BP 112/80 (BP Location: Left Arm)   Pulse 89   Temp 98.1 F (36.7 C) (Oral)   Resp 20   Ht 5\' 6"  (1.676 m)   Wt 122.7 kg   LMP 12/09/2015   SpO2 99%   BMI 43.66 kg/m   Physical Exam  Constitutional: She is oriented to person, place, and time. She appears well-developed and well-nourished. No distress.  HENT:  Head: Normocephalic and atraumatic.  Right Ear: Tympanic membrane normal.  Left Ear: Tympanic membrane normal.  Nose: Right sinus exhibits no maxillary sinus tenderness and no frontal sinus tenderness. Left sinus exhibits no maxillary sinus tenderness and no frontal sinus tenderness.  Mouth/Throat: Uvula is midline,  oropharynx is clear and moist and mucous membranes are normal. No oropharyngeal exudate, posterior oropharyngeal edema, posterior oropharyngeal erythema or tonsillar abscesses. No tonsillar exudate.  Rhinorrhea   Eyes: Conjunctivae and EOM are normal. Right eye exhibits no discharge. Left  eye exhibits no discharge. No scleral icterus.  Neck: Normal range of motion. Neck supple.  Cardiovascular: Normal rate, regular rhythm, normal heart sounds and intact distal pulses.   Pulmonary/Chest: Effort normal and breath sounds normal. No respiratory distress. She has no wheezes. She has no rales. She exhibits no tenderness.  Abdominal: Soft. Bowel sounds are normal. There is no tenderness.  Musculoskeletal: Normal range of motion. She exhibits no edema.  Mild tenderness over bilateral paraspinal lumbar muscles. No midline C, T, or L tenderness. Full range of motion of neck and back. Full range of motion of bilateral upper and lower extremities, with 5/5 strength. Sensation intact. 2+ radial and PT pulses. Cap refill <2 seconds. Patient able to stand and ambulate without assistance.    Lymphadenopathy:    She has no cervical adenopathy.  Neurological: She is alert and oriented to person, place, and time. She has normal strength. She displays normal reflexes. No sensory deficit. Gait normal.  No saddle anesthesia  Skin: Skin is warm and dry. Capillary refill takes less than 2 seconds. She is not diaphoretic.  Nursing note and vitals reviewed.    ED Treatments / Results  DIAGNOSTIC STUDIES: Oxygen Saturation is 99% on RA, normal by my interpretation.    COORDINATION OF CARE: 7:45 PM Discussed treatment plan with pt at bedside which includes Toradol,, Robaxin, Tessalon and Ibuprofen and pt agreed to plan.  Labs (all labs ordered are listed, but only abnormal results are displayed) Labs Reviewed - No data to display  EKG  EKG Interpretation None       Radiology Dg Chest 2 View  Result Date:  01/02/2017 CLINICAL DATA:  Two-month history of productive cough and wheezing. Current smoker with current history of COPD, hypertension and diabetes. EXAM: CHEST  2 VIEW COMPARISON:  10/18/2015, 02/07/2015 and earlier. FINDINGS: Cardiomediastinal silhouette unremarkable, unchanged. Mild elevation of the right hemidiaphragm, unchanged. Minimal linear scarring in the lingula. Lungs otherwise clear. No localized airspace consolidation. No pleural effusions. No pneumothorax. Normal pulmonary vascularity. Visualized bony thorax intact, slight thoracolumbar dextroscoliosis. IMPRESSION: No acute cardiopulmonary disease. Electronically Signed   By: Evangeline Dakin M.D.   On: 01/02/2017 17:50    Procedures Procedures (including critical care time)  Medications Ordered in ED Medications  albuterol (PROVENTIL) (2.5 MG/3ML) 0.083% nebulizer solution 5 mg (5 mg Nebulization Given 01/02/17 1742)  ketorolac (TORADOL) 30 MG/ML injection 30 mg (30 mg Intramuscular Given 01/02/17 2006)     Initial Impression / Assessment and Plan / ED Course  I have reviewed the triage vital signs and the nursing notes.  Pertinent labs & imaging results that were available during my care of the patient were reviewed by me and considered in my medical decision making (see chart for details).    Patient with chronic back pain.  Reports pain worsened after lifting heavy boxes week ago. Denies any other recent fall, trauma or injury. No neurological deficits and normal neuro exam.  Patient can walk but states is painful.  No loss of bowel or bladder control.  No concern for cauda equina.  No fever, night sweats, weight loss, h/o cancer, IVDU.  RICE protocol and pain medicine indicated and discussed with patient.    Pt CXR negative for acute infiltrate. Patients symptoms are consistent with URI, likely viral etiology. Discussed that antibiotics are not indicated for viral infections. Pt will be discharged with symptomatic treatment.   Verbalizes understanding and is agreeable with plan. Pt is hemodynamically stable & in NAD prior to  dc.   I personally performed the services described in this documentation, which was scribed in my presence. The recorded information has been reviewed and is accurate.   Final Clinical Impressions(s) / ED Diagnoses   Final diagnoses:  Upper respiratory tract infection, unspecified type  Chronic bilateral low back pain without sciatica    New Prescriptions Discharge Medication List as of 01/02/2017  8:06 PM    START taking these medications   Details  benzonatate (TESSALON) 100 MG capsule Take 2 capsules (200 mg total) by mouth 2 (two) times daily as needed for cough., Starting Thu 01/02/2017, Print    oxymetazoline (AFRIN NASAL SPRAY) 0.05 % nasal spray Place 1 spray into both nostrils 2 (two) times daily. Spray once into each nostril twice daily for up to the next 3 days. Do not use for more than 3 days to prevent rebound rhinorrhea., Starting Thu 01/02/2017, Print         [address] [place], [place] 01/03/17 0010    Gareth Morgan, MD 01/10/17 0930

## 2017-01-02 NOTE — Discharge Instructions (Signed)
Take your medications as prescribed. I recommend applying ice and/or heat to affected area for 15-20 minutes 3-4 times daily for additional pain relief. Refrain from doing any heavy lifting until her symptoms have improved. Please follow up with a primary care provider from the Resource Guide provided below in one week as needed. Please return to the Emergency Department if symptoms worsen or new onset of fever, numbness, tingling, groin anesthesia, loss of bowel or bladder, weakness, chest pain, abdominal pain, vomiting, difficulty breathing.

## 2017-01-02 NOTE — ED Triage Notes (Signed)
Patient c/o chronic lower back pain. Patient states she was taking Ibuprofen, but Ibuprofen makes her sick on her stomach now because she has been taking for years. Patient c/o productive cough x 2 months with gray/cream color. Patient has expiratory wheezing.

## 2017-03-17 ENCOUNTER — Encounter: Payer: Self-pay | Admitting: Physical Therapy

## 2017-03-17 ENCOUNTER — Ambulatory Visit: Payer: Medicaid Other | Attending: Urology | Admitting: Physical Therapy

## 2017-03-17 DIAGNOSIS — R279 Unspecified lack of coordination: Secondary | ICD-10-CM | POA: Diagnosis present

## 2017-03-17 DIAGNOSIS — M62838 Other muscle spasm: Secondary | ICD-10-CM | POA: Diagnosis present

## 2017-03-17 DIAGNOSIS — M6281 Muscle weakness (generalized): Secondary | ICD-10-CM

## 2017-03-17 NOTE — Patient Instructions (Addendum)
About Abdominal Massage  Abdominal massage, also called external colon massage, is a self-treatment circular massage technique that can reduce and eliminate gas and ease constipation. The colon naturally contracts in waves in a clockwise direction starting from inside the right hip, moving up toward the ribs, across the belly, and down inside the left hip.  When you perform circular abdominal massage, you help stimulate your colon's normal wave pattern of movement called peristalsis.  It is most beneficial when done after eating.  Positioning You can practice abdominal massage with oil while lying down, or in the shower with soap.  Some people find that it is just as effective to do the massage through clothing while sitting or standing.  How to Massage Start by placing your finger tips or knuckles on your right side, just inside your hip bone.  . Make small circular movements while you move upward toward your rib cage.   . Once you reach the bottom right side of your rib cage, take your circular movements across to the left side of the bottom of your rib cage.  . Next, move downward until you reach the inside of your left hip bone.  This is the path your feces travel in your colon. . Continue to perform your abdominal massage in this pattern for 10 minutes each day.     You can apply as much pressure as is comfortable in your massage.  Start gently and build pressure as you continue to practice.  Notice any areas of pain as you massage; areas of slight pain may be relieved as you massage, but if you have areas of significant or intense pain, consult with your healthcare provider.  Other Considerations . General physical activity including bending and stretching can have a beneficial massage-like effect on the colon.  Deep breathing can also stimulate the colon because breathing deeply activates the same nervous system that supplies the colon.   . Abdominal massage should always be used in  combination with a bowel-conscious diet that is high in the proper type of fiber for you, fluids (primarily water), and a regular exercise program.   Relaxation Exercises with the Urge to Void   When you experience an urge to void:  FIRST  Stop and stand very still    Sit down if you can    Don't move    You need to stay very still to maintain control  SECOND Squeeze your pelvic floor muscles 5 times, like a quick flick, to keep from leaking  THIRD Relax  Take a deep breath and then let it out  Try to make the urge go away by using relaxation and visualization techniques  FINALLY When you feel the urge go away somewhat, walk normally to the bathroom.   If the urge gets suddenly stronger on the way, you may stop again and relax to regain control.  Balloon Breath    Place hands LIGHTLY on belly below navel. Imagine a balloon inside belly. Blow up balloon on breath IN, deflate balloon on breath OUT. Contract abdominals slightly to assist breath OUT. Time _3__ minutes.  Copyright  VHI. All rights reserved.    Position yourself as shown grabbing onto feet or behind the knees. You should feel a gentle stretch. Breathe in and allow the pelvic floor muscles to relax. Hold 1 min. 2 times per day.  Adductors, Frog Squat    Crouch with elbows inside knees . Gently push knees outward. Hold _30__ seconds.1 Repeat __1_ times  per session. Do _2__ sessions per day.  Copyright  VHI. All rights reserved.   BACK: Child's Pose (Sciatica)    Sit in knee-chest position and reach arms forward. Separate knees for comfort. Hold position for _60__ breaths. Repeat _2__ times. Do _2__ times per day.  Copyright  VHI. All rights reserved.   Piriformis Stretch    Lying on back, pull right knee toward opposite shoulder. Hold _30___ seconds. Repeat __2__ times. Do _1___ sessions per day.  [email or web address].[email or web address]   Copyright  VHI. All rights reserved.   Piriformis Stretch,  Supine    Lie supine, one ankle crossed onto opposite knee. Holding bottom leg behind knee, gently pull legs toward chest until stretch is felt in buttock of top leg. Hold _30__ seconds. For deeper stretch gently push top knee away from body.  Repeat _2__ times per session. Do _2__ sessions per day.  Copyright  VHI. All rights reserved.    Supine Knee-to-Chest, Unilateral    Lie on back, hands clasped behind one knee. Pull knee in toward chest until a comfortable stretch is felt in lower back and buttocks. Hold 30___ seconds.  Repeat __2_ times per session. Do _1__ sessions per day.  Copyright  VHI. All rights reserved.  Supine With Rotation    Lie on back with one knee drawn toward chest. Slowly bring bent leg across body until stretch is felt in lower back area. Hold _30__ seconds. Repeat to other side. Repeat _2__ times per session. Do __2_ sessions per day.  Copyright  VHI. All rights reserved.  Butterfly, Supine    Lie on back, feet together. Lower knees toward floor. Hold 60___ seconds. Repeat _2__ times per session. Do _1__ sessions per day.  Copyright  VHI. All rights reserved.   Chair Sitting    Sit at edge of seat, spine straight, one leg extended. Put a hand on each thigh and bend forward from the hip, keeping spine straight. Allow hand on extended leg to reach toward toes. Support upper body with other arm. Hold _30__ seconds. Repeat __3_ times per session. Do _1__ sessions per day.  Copyright  VHI. All rights reserved.

## 2017-03-17 NOTE — Therapy (Signed)
[facility] [address] [place], [place], [address] Phone: [phone]   Fax:  [phone]  Physical Therapy Evaluation  Patient Details  Name: Alicia Blake MRN: [id number] Date of Birth: July 27, 1966 Referring Provider: Festus Aloe   Encounter Date: 03/17/2017      PT End of Session - 03/17/17 1550    Visit Number 1   Number of Visits 1   Date for PT Re-Evaluation 03/17/17   Authorization Type one x visit only; medicaid   PT Start Time 7616   PT Stop Time 1559   PT Time Calculation (min) 66 min   Activity Tolerance Patient tolerated treatment well   Behavior During Therapy [facility] for tasks assessed/performed      Past Medical History:  Diagnosis Date  . Asthma   . Bipolar affect, depressed ([place])   . Bronchitis   . Chronic back pain   . COPD (chronic obstructive pulmonary disease) ([place])   . Diabetes mellitus without complication ([place])    DIET CONTROLLED entered by office  . DJD (degenerative joint disease)    BACK  . GERD (gastroesophageal reflux disease)   . Hypertension   . Pre-diabetes    dr entered diabetes without complications- no meds does not test sugar at home  . PTSD (post-traumatic stress disorder)   . Scoliosis   . Seizures ([place])    "when drinking" last yrs ago  . Shortness of breath dyspnea    exersion  . Thyroid disease   . UTI (lower urinary tract infection)     Past Surgical History:  Procedure Laterality Date  . 2 LEFT TOES SURGERY  MARCH 2016   right also hammer toes  . BALLOON DILATION N/A 12/14/2015   Procedure: BALLOON DILATION;  Surgeon: Teena Irani, MD;  Location: [facility];  Service: Endoscopy;  Laterality: N/A;  . COLONOSCOPY WITH PROPOFOL N/A 12/14/2015   Procedure: COLONOSCOPY WITH PROPOFOL;  Surgeon: Teena Irani, MD;  [facility];  Service: Endoscopy;  Laterality: N/A;  . ESOPHAGOGASTRODUODENOSCOPY (EGD) WITH PROPOFOL N/A 12/14/2015   Procedure:  ESOPHAGOGASTRODUODENOSCOPY (EGD) WITH PROPOFOL;  Surgeon: Teena Irani, MD;  [facility];  Service: Endoscopy;  Laterality: N/A;  . LEFT ANKLE SURGERY  MARCH 2016  . NECK SURGERY  AS TEENAGER   STITCHES TO NECK   . SURGERY FOR CUT ON STOMACH  TEENAGER  . TOTAL HIP ARTHROPLASTY Left 05/14/2016   Procedure: TOTAL HIP ARTHROPLASTY ANTERIOR APPROACH;  Surgeon: Renette Butters, MD;  Location: [place];  Service: Orthopedics;  Laterality: Left;    There were no vitals filed for this visit.       Subjective Assessment - 03/17/17 1458    Subjective I'm hurting today.  Right side of lower back.  I'm taking medicine for muscle spasms.  Had hip replacement on left last June 2017.  Shoulders have been so tight and heavy.  Knees have been swelling since the hip surgery.  Left ankle is hurting because that was broken in 2 places and had surgery around 2013.  In 2014/15 I started haivng incontinence.  It has gotten worse and I have leakage.   Patient Stated Goals get rid of pain and have more control of bladder   Currently in Pain? Yes   Pain Score 8    Pain Location Pelvis   Pain Descriptors / Indicators Burning;Tightness   Pain Type Chronic pain   Pain Radiating Towards lower abdomen, back, sides and legs   Pain Onset  More than a month ago   Pain Frequency Constant   Aggravating Factors  stress, standing too long   Pain Relieving Factors ice or heat   Effect of Pain on Daily Activities feel pain all the time   Multiple Pain Sites No            [facility] PT Assessment - 03/17/17 0001      Assessment   Medical Diagnosis R53.0 Urinary frequency   Referring Provider ESKRIDGE, MATTHEW    Onset Date/Surgical Date --  several years ago, getting worse   Prior Therapy no     Precautions   Precautions None     Restrictions   Weight Bearing Restrictions No     Balance Screen   Has the patient fallen in the past 6 months No     [place] residence    [place] to enter  2 flights     Prior Function   Level of Independence Independent     Cognition   Overall Cognitive Status Within Functional Limits for tasks assessed     Observation/Other Assessments   Observations obese with excess abdominal fat   Focus on Therapeutic Outcomes (FOTO)  1x visit only     ROM / Strength   AROM / PROM / Strength --  mild limitation in all     Palpation   Palpation comment tight adductors, rectus abdominis     Ambulation/Gait   Gait Pattern Lateral trunk lean to right;Lateral trunk lean to left                 Pelvic Floor Special Questions - 03/17/17 0001    Prior Pelvic/Prostate Exam Yes   Date of Last Pelvic/Prostate Exam --   a while ago   Prior Urinalysis Yes   Are you Pregnant or attempting pregnancy? No   Currently Sexually Active Yes   Is this Painful Yes   History of sexually transmitted disease Yes   Marinoff Scale discomfort that does not affect completion   Urinary Leakage Yes   How often 3   Pad use yes  1 pad   Activities that cause leaking With strong urge;Coughing   Urinary frequency 6-7x/day   Fecal incontinence No   Fluid intake 3-4 glasses   Caffeine beverages no   Falling out feeling (prolapse) No   Skin Integrity Intact   Prolapse None   Pelvic Floor Internal Exam pt informed and consent given to perform internal assessment   Exam Type Vaginal   Sensation WNL   Palpation ischiocav and bulbocav tight   Strength weak squeeze, no lift   Strength # of reps 2   Strength # of seconds 4   Tone functional          [facility] Adult PT Treatment/Exercise - 03/17/17 0001      Self-Care   Self-Care Other Self-Care Comments   Other Self-Care Comments  abdominal massage, urge to void     Neuro Re-ed    Neuro Re-ed Details  balloon breathing, urge to void pelvic floor lift     Manual Therapy   Manual Therapy Soft tissue mobilization;Internal Pelvic Floor   Manual  therapy comments pt informed and consent given to treat pelvic floor internal soft tissue massage   Soft tissue mobilization rectus abdominis insertion on pubic bone   Internal Pelvic Floor ischiocavernosis left  PT Education - 03/17/17 1527    Education provided Yes   Education Details abdominal massage, urge to void, balloon breathing   Person(s) Educated Patient   Methods Explanation;Demonstration;Tactile cues;Verbal cues;Handout   Comprehension Verbalized understanding;Returned demonstration             PT Long Term Goals - 03/17/17 1715      PT LONG TERM GOAL #1   Title Pt will be educated on HEP as seen in chart   Time 1   Period Days   Status Achieved               Plan - 03/17/17 1716    Clinical Impression Statement Patient presents to clinic with chronic pain and urinary urgency and incontinence.  She is here for 1 time visit due to coverage provided by medicaid.  Pt has muscle weakness of 2/5 pelvic floor strength and difficulty relaxing pelvic floor.  Pt has increased muscles spasms.  Pt has difficulty with muscle coordination and increased symptoms associated with stress and lack of awareness of pelvic floor as well as increased bladder sensativitiy. Pt will benefit from skilled PT for one visit to provide education on how she can progress with relaxation techniques, stretches, controlling urge to void and abdominal massage.  Pt was moderate complexity due to complications such as oesity, chrinic pain and hip arthroplasty.  Pt had abdomen, hips, and pelvis to address and condition has been progressively getting worse.  Pt benefited from skilled PT today to address impairments and provide information for her to progress on her own.   Rehab Potential Good   Clinical Impairments Affecting Rehab Potential obesity, chronic pain, hip surgery   PT Frequency One time visit   PT Treatment/Interventions ADLs/Self Care Home Management;Manual  techniques;Patient/family education;Neuromuscular re-education;Therapeutic exercise   PT Next Visit Plan one time visit   PT Home Exercise Plan as seen in chart   Recommended Other Services n/a   Consulted and Agree with Plan of Care Patient      Patient will benefit from skilled therapeutic intervention in order to improve the following deficits and impairments:  Abnormal gait, Decreased strength, Pain, Increased muscle spasms  Visit Diagnosis: Muscle weakness (generalized)  Unspecified lack of coordination  Other muscle spasm     Problem List Patient Active Problem List   Diagnosis Date Noted  . Tobacco abuse 04/19/2016  . Primary osteoarthritis of left hip 04/18/2016  . Opioid use disorder, mild, abuse 09/08/2015  . Hypokalemia 09/07/2015  . Schizoaffective disorder, bipolar type ([place]) 09/07/2015  . Chronic pain syndrome 09/07/2015  . Alcohol use disorder, moderate, in sustained remission ([place]) 09/07/2015  . Cocaine use disorder, moderate, in sustained remission ([place]) 09/07/2015  . Cannabis use disorder, moderate, in sustained remission ([place]) 09/07/2015  . DDD (degenerative disc disease), lumbar 09/07/2015  . Hx of fracture of ankle 09/07/2015  . Symptomatic Fibroids 08/09/2013  . Breast lump on left side at 9 o'clock position 06/29/2013  . Breast discharge 06/29/2013    Zannie Cove, PT 03/17/2017, 5:33 PM  [facility] [address] [place], [place], [address] Phone: [phone]   Fax:  [phone]  Name: Alicia Blake MRN: [id number] Date of Birth: January 08, 1966

## 2017-03-18 ENCOUNTER — Encounter (HOSPITAL_COMMUNITY): Payer: Self-pay | Admitting: Emergency Medicine

## 2017-03-18 ENCOUNTER — Emergency Department (HOSPITAL_COMMUNITY)
Admission: EM | Admit: 2017-03-18 | Discharge: 2017-03-18 | Discharge status: Against Medical Advice | Payer: Medicaid Other | Attending: Physician Assistant | Admitting: Physician Assistant

## 2017-03-18 DIAGNOSIS — B9689 Other specified bacterial agents as the cause of diseases classified elsewhere: Secondary | ICD-10-CM

## 2017-03-18 DIAGNOSIS — F17210 Nicotine dependence, cigarettes, uncomplicated: Secondary | ICD-10-CM | POA: Diagnosis not present

## 2017-03-18 DIAGNOSIS — M545 Low back pain: Secondary | ICD-10-CM

## 2017-03-18 DIAGNOSIS — Z79899 Other long term (current) drug therapy: Secondary | ICD-10-CM | POA: Insufficient documentation

## 2017-03-18 DIAGNOSIS — J449 Chronic obstructive pulmonary disease, unspecified: Secondary | ICD-10-CM | POA: Insufficient documentation

## 2017-03-18 DIAGNOSIS — R45850 Homicidal ideations: Secondary | ICD-10-CM | POA: Diagnosis not present

## 2017-03-18 DIAGNOSIS — Z7951 Long term (current) use of inhaled steroids: Secondary | ICD-10-CM | POA: Diagnosis not present

## 2017-03-18 DIAGNOSIS — I10 Essential (primary) hypertension: Secondary | ICD-10-CM | POA: Insufficient documentation

## 2017-03-18 DIAGNOSIS — R45851 Suicidal ideations: Secondary | ICD-10-CM | POA: Insufficient documentation

## 2017-03-18 DIAGNOSIS — N760 Acute vaginitis: Secondary | ICD-10-CM | POA: Insufficient documentation

## 2017-03-18 DIAGNOSIS — G8929 Other chronic pain: Secondary | ICD-10-CM

## 2017-03-18 DIAGNOSIS — M5440 Lumbago with sciatica, unspecified side: Secondary | ICD-10-CM | POA: Insufficient documentation

## 2017-03-18 DIAGNOSIS — J45909 Unspecified asthma, uncomplicated: Secondary | ICD-10-CM | POA: Diagnosis not present

## 2017-03-18 LAB — COMPREHENSIVE METABOLIC PANEL
ALBUMIN: 3.7 g/dL (ref 3.5–5.0)
ALK PHOS: 77 U/L (ref 38–126)
ALT: 16 U/L (ref 14–54)
ANION GAP: 8 (ref 5–15)
AST: 18 U/L (ref 15–41)
BUN: 13 mg/dL (ref 6–20)
CO2: 27 mmol/L (ref 22–32)
Calcium: 9.2 mg/dL (ref 8.9–10.3)
Chloride: 99 mmol/L — ABNORMAL LOW (ref 101–111)
Creatinine, Ser: 0.92 mg/dL (ref 0.44–1.00)
GFR calc Af Amer: >60 mL/min (ref >60)
GFR calc non Af Amer: >60 mL/min (ref >60)
GLUCOSE: 107 mg/dL — AB (ref 65–99)
POTASSIUM: 3.1 mmol/L — AB (ref 3.5–5.1)
SODIUM: 134 mmol/L — AB (ref 135–145)
Total Bilirubin: 0.2 mg/dL — ABNORMAL LOW (ref 0.3–1.2)
Total Protein: 8.9 g/dL — ABNORMAL HIGH (ref 6.5–8.1)

## 2017-03-18 LAB — CBC
HEMATOCRIT: 34.5 % — AB (ref 36.0–46.0)
HEMOGLOBIN: 11.6 g/dL — AB (ref 12.0–15.0)
MCH: 31.8 pg (ref 26.0–34.0)
MCHC: 33.6 g/dL (ref 30.0–36.0)
MCV: 94.5 fL (ref 78.0–100.0)
Platelets: 225 10*3/uL (ref 150–400)
RBC: 3.65 MIL/uL — ABNORMAL LOW (ref 3.87–5.11)
RDW: 13.7 % (ref 11.5–15.5)
WBC: 8.3 10*3/uL (ref 4.0–10.5)

## 2017-03-18 LAB — URINALYSIS, ROUTINE W REFLEX MICROSCOPIC
BILIRUBIN URINE: NEGATIVE
Glucose, UA: NEGATIVE mg/dL
HGB URINE DIPSTICK: NEGATIVE
Ketones, ur: NEGATIVE mg/dL
Leukocytes, UA: NEGATIVE
Nitrite: NEGATIVE
PH: 7 (ref 5.0–8.0)
Protein, ur: NEGATIVE mg/dL
Specific Gravity, Urine: 1.01 (ref 1.005–1.030)

## 2017-03-18 LAB — WET PREP, GENITAL
Sperm: NONE SEEN
Trich, Wet Prep: NONE SEEN
YEAST WET PREP: NONE SEEN

## 2017-03-18 LAB — LIPASE, BLOOD: Lipase: 19 U/L (ref 11–51)

## 2017-03-18 MED ORDER — NICOTINE 21 MG/24HR TD PT24: 21.0000 mg | MEDICATED_PATCH | Freq: Every day | TRANSDERMAL | Status: DC

## 2017-03-18 MED ORDER — KETOROLAC TROMETHAMINE 60 MG/2ML IM SOLN: 60.0000 mg | Freq: Once | INTRAMUSCULAR | Status: DC

## 2017-03-18 MED ORDER — ACETAMINOPHEN 325 MG PO TABS: 650.0000 mg | ORAL_TABLET | ORAL | Status: DC | PRN

## 2017-03-18 MED ORDER — LORAZEPAM 1 MG PO TABS: 1.0000 mg | ORAL_TABLET | Freq: Three times a day (TID) | ORAL | Status: DC | PRN

## 2017-03-18 MED ORDER — ALUM & MAG HYDROXIDE-SIMETH 200-200-20 MG/5ML PO SUSP: 30.0000 mL | ORAL | Status: DC | PRN

## 2017-03-18 MED ORDER — METHOCARBAMOL 500 MG PO TABS
1000.0000 mg | ORAL_TABLET | Freq: Once | ORAL | Status: AC
  Administered 2017-03-18: 1000 mg via ORAL
  Filled 2017-03-18: qty 2

## 2017-03-18 MED ORDER — POTASSIUM CHLORIDE CRYS ER 20 MEQ PO TBCR
40.0000 meq | EXTENDED_RELEASE_TABLET | Freq: Once | ORAL | Status: AC
  Administered 2017-03-18: 40 meq via ORAL
  Filled 2017-03-18: qty 2

## 2017-03-18 MED ORDER — ONDANSETRON HCL 4 MG PO TABS: 4.0000 mg | ORAL_TABLET | Freq: Three times a day (TID) | ORAL | Status: DC | PRN

## 2017-03-18 NOTE — ED Triage Notes (Signed)
Pt c/o twisting, pulling lower back pain radiating into right hip x several weeks, worsened last night. Also c/o lower abdominal pain, dysuria, malodorous urine x 2 weeks.

## 2017-03-18 NOTE — ED Notes (Signed)
Patient left without being discharged. Patient left because her mother was not allowed to visit at the time. Pt was not IVC at the time. M.d was made aware.

## 2017-03-18 NOTE — ED Notes (Signed)
Patient left without being discharged. Pt left because her mother was not allowed to visit at the time. Pt was not IVC at the time. Security was made aware of the pt leaving. Emily P.A was made aware at the time.

## 2017-03-18 NOTE — ED Notes (Signed)
Patient refusing to get dress in purple scrubs and also refusing to go to sappu. At this time. M.d made aware of the situation.

## 2017-03-18 NOTE — ED Notes (Signed)
Patient left the room and security call. Unable find the patient at this time. Security still looking for her but is not IVC yet but M.d in is aware and was in the process of completing IVC papers.

## 2017-03-18 NOTE — ED Provider Notes (Signed)
[facility] DEPT Provider Note   CSN: [id number] Arrival date & time: 03/18/17  1416     History   Chief Complaint Chief Complaint  Patient presents with  . Back Pain  . Abdominal Pain    HPI Alicia Blake is a 51 y.o. female.  HPI   Pt p/w multiple complaints.  She has had ongoing with sided back and hip pain, chronic pelvic floor dysfunction and two weeks of dysuria and malodor - which she is not sure is vaginal or urine related.  She has bilateral leg swelling and bilateral knee pain.    She also feels that all of her pain is made worse by her mother who is staying with her.  She feels a lot of stress from her mother and is starting to feel suicidal and homicidal.  States she just wants to "shake her."  She states "I wanted to cut myself.  If I had a gun, I want to shoot myself and shoot her."    Past Medical History:  Diagnosis Date  . Asthma   . Bipolar affect, depressed ([place])   . Bronchitis   . Chronic back pain   . COPD (chronic obstructive pulmonary disease) ([place])   . Diabetes mellitus without complication ([place])    DIET CONTROLLED entered by office  . DJD (degenerative joint disease)    BACK  . GERD (gastroesophageal reflux disease)   . Hypertension   . Pre-diabetes    dr entered diabetes without complications- no meds does not test sugar at home  . PTSD (post-traumatic stress disorder)   . Scoliosis   . Seizures ([place])    "when drinking" last yrs ago  . Shortness of breath dyspnea    exersion  . Thyroid disease   . UTI (lower urinary tract infection)     Patient Active Problem List   Diagnosis Date Noted  . Tobacco abuse 04/19/2016  . Primary osteoarthritis of left hip 04/18/2016  . Opioid use disorder, mild, abuse 09/08/2015  . Hypokalemia 09/07/2015  . Schizoaffective disorder, bipolar type ([place]) 09/07/2015  . Chronic pain syndrome 09/07/2015  . Alcohol use disorder, moderate, in sustained remission ([place]) 09/07/2015  . Cocaine use disorder,  moderate, in sustained remission ([place]) 09/07/2015  . Cannabis use disorder, moderate, in sustained remission ([place]) 09/07/2015  . DDD (degenerative disc disease), lumbar 09/07/2015  . Hx of fracture of ankle 09/07/2015  . Symptomatic Fibroids 08/09/2013  . Breast lump on left side at 9 o'clock position 06/29/2013  . Breast discharge 06/29/2013    Past Surgical History:  Procedure Laterality Date  . 2 LEFT TOES SURGERY  MARCH 2016   right also hammer toes  . BALLOON DILATION N/A 12/14/2015   Procedure: BALLOON DILATION;  Surgeon: Teena Irani, MD;  [facility];  Service: Endoscopy;  Laterality: N/A;  . COLONOSCOPY WITH PROPOFOL N/A 12/14/2015   Procedure: COLONOSCOPY WITH PROPOFOL;  Surgeon: Teena Irani, MD;  [facility];  Service: Endoscopy;  Laterality: N/A;  . ESOPHAGOGASTRODUODENOSCOPY (EGD) WITH PROPOFOL N/A 12/14/2015   Procedure: ESOPHAGOGASTRODUODENOSCOPY (EGD) WITH PROPOFOL;  Surgeon: Teena Irani, MD;  [facility];  Service: Endoscopy;  Laterality: N/A;  . LEFT ANKLE SURGERY  MARCH 2016  . NECK SURGERY  AS TEENAGER   STITCHES TO NECK   . SURGERY FOR CUT ON STOMACH  TEENAGER  . TOTAL HIP ARTHROPLASTY Left 05/14/2016   Procedure: TOTAL HIP ARTHROPLASTY ANTERIOR APPROACH;  Surgeon: Renette Butters, MD;  Location: [place];  Service:  Orthopedics;  Laterality: Left;    OB History    Gravida Para Term Preterm AB Living   1 1 1     1    SAB TAB Ectopic Multiple Live Births                   Home Medications    Prior to Admission medications   Medication Sig Start Date End Date Taking? Authorizing Provider  albuterol (PROVENTIL HFA;VENTOLIN HFA) 108 (90 BASE) MCG/ACT inhaler Inhale 2 puffs into the lungs every 6 (six) hours as needed for wheezing.   Yes [provider]  Chlorphen-Phenyleph-ASA (ALKA-SELTZER PLUS COLD PO) Take 1 tablet by mouth daily as needed (cold symptoms).   Yes [provider]  Docusate Calcium (STOOL SOFTENER PO)  Take 1 capsule by mouth daily.   Yes [provider]  FLUoxetine (PROZAC) 20 MG tablet Take 20 mg by mouth daily.   Yes [provider]  lithium carbonate (LITHOBID) 300 MG CR tablet Take 300 mg by mouth at bedtime.   Yes [provider]  Multiple Vitamin (MULTIVITAMIN) tablet Take 1 tablet by mouth daily.   Yes [provider]  omeprazole (PRILOSEC) 20 MG capsule Take 1 capsule (20 mg total) by mouth daily. 05/14/16  Yes Renette Butters, MD  QUEtiapine (SEROQUEL) 300 MG tablet Take 1 tablet (300 mg total) by mouth at bedtime. 09/12/15  Yes Withrow, Elyse Jarvis, FNP  Solifenacin Succinate (VESICARE PO) Take 1 tablet by mouth daily.   Yes [provider]  aspirin EC 325 MG tablet Take 1 tablet (325 mg total) by mouth daily. Patient not taking: Reported on 03/18/2017 05/14/16   Renette Butters, MD  benzonatate (TESSALON) 100 MG capsule Take 2 capsules (200 mg total) by mouth 2 (two) times daily as needed for cough. Patient not taking: Reported on 03/18/2017 01/02/17   Nona Dell, PA-C  divalproex (DEPAKOTE) 500 MG DR tablet Take 1 tablet (500 mg total) by mouth every 12 (twelve) hours. Patient not taking: Reported on 03/18/2017 09/12/15   Benjamine Mola, FNP  docusate sodium (COLACE) 100 MG capsule Take 1 capsule (100 mg total) by mouth 2 (two) times daily. Patient not taking: Reported on 03/18/2017 05/14/16   Renette Butters, MD  hydrOXYzine (ATARAX/VISTARIL) 25 MG tablet Take 1 tablet (25 mg total) by mouth every 6 (six) hours as needed for anxiety. Patient not taking: Reported on 03/18/2017 09/12/15   Benjamine Mola, FNP  ibuprofen (ADVIL,MOTRIN) 600 MG tablet Take 1 tablet (600 mg total) by mouth every 6 (six) hours as needed. Patient not taking: Reported on 03/18/2017 01/02/17   Nona Dell, PA-C  lisinopril (PRINIVIL,ZESTRIL) 10 MG tablet Take 1 tablet (10 mg total) by mouth daily. Patient not taking: Reported on 11/12/2016 09/12/15    Withrow, Elyse Jarvis, FNP  methocarbamol (ROBAXIN) 500 MG tablet Take 1 tablet (500 mg total) by mouth 2 (two) times daily. Patient not taking: Reported on 03/18/2017 01/02/17   Nona Dell, PA-C  ondansetron (ZOFRAN) 4 MG tablet Take 1 tablet (4 mg total) by mouth every 8 (eight) hours as needed for nausea or vomiting. Patient not taking: Reported on 11/12/2016 05/14/16   Renette Butters, MD  oxyCODONE-acetaminophen (ROXICET) 5-325 MG tablet Take 1-2 tablets by mouth every 4 (four) hours as needed for severe pain. Patient not taking: Reported on 11/12/2016 05/14/16   Renette Butters, MD  oxymetazoline [facility] NASAL SPRAY) 0.05 % nasal spray Place 1 spray  into both nostrils 2 (two) times daily. Spray once into each nostril twice daily for up to the next 3 days. Do not use for more than 3 days to prevent rebound rhinorrhea. Patient not taking: Reported on 03/18/2017 01/02/17   Nona Dell, PA-C    Family History Family History  Problem Relation Age of Onset  . Hypertension Mother   . Hypertension Maternal Grandmother   . Hypertension Paternal Grandmother   . Diabetes Paternal Grandmother   . Hypertension Paternal Grandfather   . Diabetes Paternal Grandfather   . Alcohol abuse Paternal Aunt   . Mental illness Cousin   . Heart attack Cousin   . Heart attack Maternal Uncle     Social History Social History  Substance Use Topics  . Smoking status: Current Every Day Smoker    Packs/day: 0.25    Types: Cigarettes  . Smokeless tobacco: Never Used  . Alcohol use No     Comment: quit date 01/17/2013     Allergies   Buspirone; Crab [shellfish allergy]; Lithium; Other; Meloxicam; and Gabapentin   Review of Systems Review of Systems  All other systems reviewed and are negative.    Physical Exam Updated Vital Signs BP 115/62 (BP Location: Left Arm)   Pulse 99   Temp 98 F (36.7 C)   Resp 18   Ht 5\' 6"  (1.676 m)   Wt 122.5 kg (270 lb)   LMP 12/09/2015   SpO2  98%   BMI 43.58 kg/m   Physical Exam  Constitutional: She appears well-developed and well-nourished. No distress.  HENT:  Head: Normocephalic and atraumatic.  Neck: Neck supple.  Cardiovascular: Normal rate and regular rhythm.   Pulmonary/Chest: Effort normal and breath sounds normal. No respiratory distress. She has no wheezes. She has no rales.  Abdominal: Soft. She exhibits no distension. There is no tenderness. There is no rebound and no guarding.  Genitourinary:  Genitourinary Comments: Small amount of white discharge.  Mild cervical tenderness.  Mild midline tenderness on bimanual.  Bimanual exam limited secondary to body habitus.   Neurological: She is alert.  Moves legs equally, no edema, no tenderness.  Pt uses both legs to pull herself down on the stretcher for pelvic.    Skin: She is not diaphoretic.  Psychiatric: Her affect is labile. She expresses homicidal and suicidal ideation.  Nursing note and vitals reviewed.    ED Treatments / Results  Labs (all labs ordered are listed, but only abnormal results are displayed) Labs Reviewed  WET PREP, GENITAL - Abnormal; Notable for the following:       Result Value   Clue Cells Wet Prep HPF POC PRESENT (*)    WBC, Wet Prep HPF POC FEW (*)    All other components within normal limits  COMPREHENSIVE METABOLIC PANEL - Abnormal; Notable for the following:    Sodium 134 (*)    Potassium 3.1 (*)    Chloride 99 (*)    Glucose, Bld 107 (*)    Total Protein 8.9 (*)    Total Bilirubin 0.2 (*)    All other components within normal limits  CBC - Abnormal; Notable for the following:    RBC 3.65 (*)    Hemoglobin 11.6 (*)    HCT 34.5 (*)    All other components within normal limits  LIPASE, BLOOD  URINALYSIS, ROUTINE W REFLEX MICROSCOPIC  RPR  HIV ANTIBODY (ROUTINE TESTING)  GC/CHLAMYDIA PROBE AMP ( [place]) NOT AT [facility]    EKG  EKG Interpretation None       Radiology No results found.  Procedures Procedures  (including critical care time)  Medications Ordered in ED Medications  LORazepam (ATIVAN) tablet 1 mg (not administered)  acetaminophen (TYLENOL) tablet 650 mg (not administered)  nicotine (NICODERM CQ - dosed in mg/24 hours) patch 21 mg (not administered)  ondansetron (ZOFRAN) tablet 4 mg (not administered)  alum & mag hydroxide-simeth (MAALOX/MYLANTA) 200-200-20 MG/5ML suspension 30 mL (not administered)  ketorolac (TORADOL) injection 60 mg (not administered)  methocarbamol (ROBAXIN) tablet 1,000 mg (1,000 mg Oral Given 03/18/17 1820)  potassium chloride SA (K-DUR,KLOR-CON) CR tablet 40 mEq (40 mEq Oral Given 03/18/17 1820)     Initial Impression / Assessment and Plan / ED Course  I have reviewed the triage vital signs and the nursing notes.  Pertinent labs & imaging results that were available during my care of the patient were reviewed by me and considered in my medical decision making (see chart for details).  Clinical Course as of Mar 18 2326  Tue Mar 18, 2017  1934 I spoke with patient and told her she needed to stay, told her my concerns about what she had said to me and to [facility] and I thought that keeping her was the best thing for her.  I expressed concern for her safety.  She stated she was not going to stay.  She was also awaiting further treatment for her back pain and for BV.  I spoke with the nurse after speaking with patient and informed her the patient would need to stay or be IVC'd if she was not voluntary.  Pt then came out of the room to talk to nurse and then disappeared.  Security is currently looking for patient.  IVC papers are being filed.    [EW]    Clinical Course User Index [EW] Clayton Bibles, [place]    Pt with multiple physical complaints that are all chronic with focus of discussion led by patient centered on the stress her mother causes her and how she thinks she needs to go to behavioral health.  Workup significant for BV, for which patient was not treated due to  elopement.  Pt was placed under IVC by Dr Thomasene Lot and police were sent to bring patient back to ED.    Final Clinical Impressions(s) / ED Diagnoses   Final diagnoses:  Suicidal ideation  Homicidal ideation  Chronic low back pain, unspecified back pain laterality, with sciatica presence unspecified  BV (bacterial vaginosis)    New Prescriptions New Prescriptions   No medications on file     Clayton Bibles, Hershal Coria 03/18/17 2327    Macarthur Critchley, MD 03/27/17 [phone]

## 2017-03-18 NOTE — ED Notes (Signed)
Constipation X1 Week, abdominal pain Pt states maybe pelvic infection, LT lower back pain, swelling in legs.

## 2017-03-18 NOTE — BH Assessment (Signed)
Assessment Note  Alicia Blake is an 51 y.o. female. Patient presents to [facility], voluntarily. Sts that she is suicidal because, "My mother gets on my nerves". She lives alone but sts that her mother is always at her house. She has asked her mom to leave but feels guilty for doing so. She will change her mind and ask her to stay. Sts that this makes her increasingly angry and frustrated. She is having homicidal  thoughts of slapping or shaking her mother to death. Sts that she is also depressed with symptoms of isolating self from others, hopelessness, and guilt. Patient has suicidal thoughts to cut herself. She went to [facility] yesterday and purchased 6 knifes. She has tried to harm herself multiple times in the past (cutting self, overdosing on cocaine, drinking green alcohol). She self mutilates by cutting herself. She has auditory hallucinations of voices that call her name. She has visual hallucinations of "flashing people". She feels paranoid stating she is always checking behind doors and bathroom curtains. Patient seeks treatment at [facility]. She is prescribed Lithium and Prozac but is afraid to take the medications because of the side effects. She has been hospitalized at [facility] and a facility in [place]. Patient has rapid pressured speech. She appears manic at this time. She is oriented to time, person, place, and situation.   Diagnosis: Bipolar Affective Disorder, depressed and PTSD  Past Medical History:  Past Medical History:  Diagnosis Date  . Asthma   . Bipolar affect, depressed ([place])   . Bronchitis   . Chronic back pain   . COPD (chronic obstructive pulmonary disease) ([place])   . Diabetes mellitus without complication ([place])    DIET CONTROLLED entered by office  . DJD (degenerative joint disease)    BACK  . GERD (gastroesophageal reflux disease)   . Hypertension   . Pre-diabetes    dr entered diabetes without complications- no meds does not test sugar at home  . PTSD (post-traumatic stress  disorder)   . Scoliosis   . Seizures ([place])    "when drinking" last yrs ago  . Shortness of breath dyspnea    exersion  . Thyroid disease   . UTI (lower urinary tract infection)     Past Surgical History:  Procedure Laterality Date  . 2 LEFT TOES SURGERY  MARCH 2016   right also hammer toes  . BALLOON DILATION N/A 12/14/2015   Procedure: BALLOON DILATION;  Surgeon: Teena Irani, MD;  Location: [facility];  Service: Endoscopy;  Laterality: N/A;  . COLONOSCOPY WITH PROPOFOL N/A 12/14/2015   Procedure: COLONOSCOPY WITH PROPOFOL;  Surgeon: Teena Irani, MD;  Location: [facility];  Service: Endoscopy;  Laterality: N/A;  . ESOPHAGOGASTRODUODENOSCOPY (EGD) WITH PROPOFOL N/A 12/14/2015   Procedure: ESOPHAGOGASTRODUODENOSCOPY (EGD) WITH PROPOFOL;  Surgeon: Teena Irani, MD;  Location: [facility];  Service: Endoscopy;  Laterality: N/A;  . LEFT ANKLE SURGERY  MARCH 2016  . NECK SURGERY  AS TEENAGER   STITCHES TO NECK   . SURGERY FOR CUT ON STOMACH  TEENAGER  . TOTAL HIP ARTHROPLASTY Left 05/14/2016   Procedure: TOTAL HIP ARTHROPLASTY ANTERIOR APPROACH;  Surgeon: Renette Butters, MD;  Location: [facility];  Service: Orthopedics;  Laterality: Left;    Family History:  Family History  Problem Relation Age of Onset  . Hypertension Mother   . Hypertension Maternal Grandmother   . Hypertension Paternal Grandmother   . Diabetes Paternal Grandmother   . Hypertension Paternal Grandfather   . Diabetes Paternal Grandfather   .  Alcohol abuse Paternal Aunt   . Mental illness Cousin   . Heart attack Cousin   . Heart attack Maternal Uncle     Social History:  reports that she has been smoking Cigarettes.  She has been smoking about 0.25 packs per day. She has never used smokeless tobacco. She reports that she uses drugs, including "Crack" cocaine. She reports that she does not drink alcohol.  Additional Social History:  Alcohol / Drug Use Pain Medications: SEE MAR Prescriptions: SEE MAR; Prozac and  Lithium Over the Counter: SEE MAR History of alcohol / drug use?: Yes Longest period of sobriety (when/how long):  patient reports 3 yrs of sobriety from cocaine   CIWA: CIWA-Ar BP: 115/62 Pulse Rate: 99 COWS:    Allergies:  Allergies  Allergen Reactions  . Buspirone Swelling  . Crab [Shellfish Allergy] Swelling and Other (See Comments)    FEVER  . Lithium Other (See Comments)    SPEECH IMPAIRMENT [Patient not sure if due to Lithium]  . Other Hives and Itching    crablegs only   . Meloxicam Other (See Comments)    UNABLE TO REMEMBER  . Gabapentin Nausea And Vomiting    Home Medications:  (Not in a hospital admission)  OB/GYN Status:  Patient's last menstrual period was 12/09/2015.  General Assessment Data Location of Assessment: [facility] TTS Assessment: In system Is this a Tele or Face-to-Face Assessment?: Face-to-Face Is this an Initial Assessment or a Re-assessment for this encounter?: Initial Assessment Marital status: Single Maiden name:  (n/a) Is patient pregnant?: No Pregnancy Status: No Living Arrangements: Alone Can pt return to current living arrangement?: Yes Admission Status: Voluntary Is patient capable of signing voluntary admission?: Yes Referral Source: Self/Family/Friend Insurance type:  (MCD)     Crisis Care Plan Living Arrangements: Alone Legal Guardian: Other: (no legal issues ) Name of Psychiatrist:  Consulting civil engineer ) Name of Therapist:  Consulting civil engineer )  Education Status Is patient currently in school?: No Current Grade:  (n/a) Highest grade of school patient has completed:  (unk) Name of school:  (n/a) Contact person:  (n/a)  Risk to self with the past 6 months Suicidal Ideation: Yes-Currently Present Has patient been a risk to self within the past 6 months prior to admission? : Yes Suicidal Intent: Yes-Currently Present Has patient had any suicidal intent within the past 6 months prior to admission? : Yes Is patient at risk for suicide?:  Yes Suicidal Plan?: Yes-Currently Present Has patient had any suicidal plan within the past 6 months prior to admission? : Yes Specify Current Suicidal Plan:  (cut self) Access to Means: Yes Specify Access to Suicidal Means:  (purchased 6 knives from [employer] last night ) What has been your use of drugs/alcohol within the last 12 months?:  (patient is calm and cooperative ) Previous Attempts/Gestures: Yes (cut self; OD on cocaine, drink green alcohol ) How many times?:  (multiple ) Other Self Harm Risks:  (history of cutting self ) Triggers for Past Attempts: Other (Comment) (AVH hallucinations and paronoia) Intentional Self Injurious Behavior: Cutting Comment - Self Injurious Behavior:  (cutting ) Family Suicide History: No Recent stressful life event(s): Other (Comment) ("My relationship with my mother") Persecutory voices/beliefs?: No Depression: Yes Depression Symptoms: Feeling angry/irritable, Feeling worthless/self pity, Loss of interest in usual pleasures, Guilt, Fatigue, Isolating, Tearfulness, Despondent Substance abuse history and/or treatment for substance abuse?: No Suicide prevention information given to non-admitted patients: Not applicable  Risk to Others within the past 6 months Homicidal Ideation:  Yes-Currently Present Does patient have any lifetime risk of violence toward others beyond the six months prior to admission? : Yes (comment) Thoughts of Harm to Others: Yes-Currently Present Comment - Thoughts of Harm to Others:  ("Shake my mother to death") Current Homicidal Intent: Yes-Currently Present Current Homicidal Plan: Yes-Currently Present Describe Current Homicidal Plan:  ("I want to hit my mother....shake her to death") Access to Homicidal Means: Yes Describe Access to Homicidal Means:  (ability to shake ) Identified Victim:  (mother ) History of harm to others?: No Assessment of Violence: None Noted Violent Behavior Description:  (patient is calm and  cooperative ) Does patient have access to weapons?: No Criminal Charges Pending?: No Does patient have a court date: No Is patient on probation?: No  Psychosis Hallucinations: None noted Delusions: None noted  Mental Status Report Appearance/Hygiene: Disheveled Eye Contact: Good Motor Activity: Freedom of movement Speech: Logical/coherent Level of Consciousness: Alert Mood: Depressed Affect: Appropriate to circumstance Anxiety Level: None Thought Processes: Relevant, Coherent Judgement: Impaired Orientation: Person, Place, Time, Situation Obsessive Compulsive Thoughts/Behaviors: None  Cognitive Functioning Concentration: Decreased Memory: Recent Intact, Remote Intact IQ: Average Insight: Poor Impulse Control: Poor Appetite: Fair Weight Loss:  (none reported) Weight Gain:  (none reported) Sleep: Decreased Total Hours of Sleep:  (varies) Vegetative Symptoms: None  ADLScreening [facility]) Patient's cognitive ability adequate to safely complete daily activities?: Yes Patient able to express need for assistance with ADLs?: Yes Independently performs ADLs?: Yes (appropriate for developmental age)  Prior Inpatient Therapy Prior Inpatient Therapy: Yes Prior Therapy Dates:  ("I can't remember") Prior Therapy Facilty/Provider(s):  ([facility] in [place], [place]) Reason for Treatment:  (pyschosis and drug use )  Prior Outpatient Therapy Prior Outpatient Therapy: Yes Prior Therapy Dates:  (current) Prior Therapy Facilty/Provider(s):  Consulting civil engineer ) Reason for Treatment:  (med managment ) Does patient have an ACCT team?: No Does patient have Intensive In-House Services?  : No Does patient have [facility] services? : No Does patient have P4CC services?: No  ADL Screening (condition at time of admission) Patient's cognitive ability adequate to safely complete daily activities?: Yes Is the patient deaf or have difficulty hearing?: No Does the patient  have difficulty seeing, even when wearing glasses/contacts?: No Does the patient have difficulty concentrating, remembering, or making decisions?: No Patient able to express need for assistance with ADLs?: Yes Does the patient have difficulty dressing or bathing?: No Independently performs ADLs?: Yes (appropriate for developmental age) Does the patient have difficulty walking or climbing stairs?: No Weakness of Legs: None Weakness of Arms/Hands: None  Home Assistive Devices/Equipment Home Assistive Devices/Equipment: Cane (specify quad or straight), Walker (specify type)    Abuse/Neglect Assessment (Assessment to be complete while patient is alone) Physical Abuse: Denies Verbal Abuse: Denies Sexual Abuse: Denies Exploitation of patient/patient's resources: Denies Self-Neglect: Denies Values / Beliefs Cultural Requests During Hospitalization: None Spiritual Requests During Hospitalization: None   Advance Directives (For Healthcare) Does Patient Have a Medical Advance Directive?: No    Additional Information 1:1 In Past 12 Months?: No CIRT Risk: No Elopement Risk: No Does patient have medical clearance?: Yes     Disposition:  Disposition Initial Assessment Completed for this Encounter: Yes  On Site Evaluation by:   Reviewed with Physician:    Waldon Merl 03/18/2017 6:47 PM

## 2017-03-19 LAB — RPR: RPR: NONREACTIVE

## 2017-03-19 LAB — GC/CHLAMYDIA PROBE AMP (~~LOC~~) NOT AT ARMC
CHLAMYDIA, DNA PROBE: NEGATIVE
NEISSERIA GONORRHEA: NEGATIVE

## 2017-03-19 LAB — HIV ANTIBODY (ROUTINE TESTING W REFLEX): HIV SCREEN 4TH GENERATION: NONREACTIVE

## 2017-04-16 ENCOUNTER — Other Ambulatory Visit (HOSPITAL_COMMUNITY): Payer: Self-pay | Admitting: Neurosurgery

## 2017-04-16 ENCOUNTER — Other Ambulatory Visit: Payer: Self-pay | Admitting: Neurosurgery

## 2017-04-16 DIAGNOSIS — M5441 Lumbago with sciatica, right side: Principal | ICD-10-CM

## 2017-04-16 DIAGNOSIS — G8929 Other chronic pain: Secondary | ICD-10-CM

## 2017-04-19 ENCOUNTER — Emergency Department (HOSPITAL_COMMUNITY)
Admission: EM | Admit: 2017-04-19 | Discharge: 2017-04-19 | Disposition: A | Discharge status: Home / Self Care | Payer: Medicaid Other | Attending: Emergency Medicine | Admitting: Emergency Medicine

## 2017-04-19 ENCOUNTER — Encounter (HOSPITAL_COMMUNITY): Payer: Self-pay | Admitting: *Deleted

## 2017-04-19 DIAGNOSIS — E119 Type 2 diabetes mellitus without complications: Secondary | ICD-10-CM | POA: Diagnosis not present

## 2017-04-19 DIAGNOSIS — J449 Chronic obstructive pulmonary disease, unspecified: Secondary | ICD-10-CM | POA: Diagnosis not present

## 2017-04-19 DIAGNOSIS — Z7902 Long term (current) use of antithrombotics/antiplatelets: Secondary | ICD-10-CM | POA: Insufficient documentation

## 2017-04-19 DIAGNOSIS — F17210 Nicotine dependence, cigarettes, uncomplicated: Secondary | ICD-10-CM | POA: Insufficient documentation

## 2017-04-19 DIAGNOSIS — I10 Essential (primary) hypertension: Secondary | ICD-10-CM | POA: Insufficient documentation

## 2017-04-19 DIAGNOSIS — Z79899 Other long term (current) drug therapy: Secondary | ICD-10-CM | POA: Insufficient documentation

## 2017-04-19 DIAGNOSIS — Z7982 Long term (current) use of aspirin: Secondary | ICD-10-CM | POA: Diagnosis not present

## 2017-04-19 DIAGNOSIS — M62830 Muscle spasm of back: Secondary | ICD-10-CM | POA: Insufficient documentation

## 2017-04-19 DIAGNOSIS — J45909 Unspecified asthma, uncomplicated: Secondary | ICD-10-CM | POA: Insufficient documentation

## 2017-04-19 DIAGNOSIS — G8929 Other chronic pain: Secondary | ICD-10-CM

## 2017-04-19 DIAGNOSIS — R202 Paresthesia of skin: Secondary | ICD-10-CM

## 2017-04-19 DIAGNOSIS — M545 Low back pain: Secondary | ICD-10-CM | POA: Diagnosis present

## 2017-04-19 DIAGNOSIS — K5900 Constipation, unspecified: Secondary | ICD-10-CM | POA: Insufficient documentation

## 2017-04-19 DIAGNOSIS — M5441 Lumbago with sciatica, right side: Secondary | ICD-10-CM

## 2017-04-19 MED ORDER — LIDOCAINE 5 % EX PTCH
1.0000 | MEDICATED_PATCH | Freq: Once | CUTANEOUS | Status: DC
  Administered 2017-04-19: 1 via TRANSDERMAL
  Filled 2017-04-19: qty 1

## 2017-04-19 MED ORDER — PREDNISONE 20 MG PO TABS: ORAL_TABLET | ORAL | 0 refills | Status: DC

## 2017-04-19 MED ORDER — POLYETHYLENE GLYCOL 3350 17 G PO PACK: 17.0000 g | PACK | Freq: Every day | ORAL | 0 refills | Status: DC

## 2017-04-19 MED ORDER — METHOCARBAMOL 500 MG PO TABS: 500.0000 mg | ORAL_TABLET | Freq: Three times a day (TID) | ORAL | 0 refills | Status: DC | PRN

## 2017-04-19 MED ORDER — KETOROLAC TROMETHAMINE 30 MG/ML IJ SOLN
30.0000 mg | Freq: Once | INTRAMUSCULAR | Status: AC
  Administered 2017-04-19: 30 mg via INTRAMUSCULAR
  Filled 2017-04-19: qty 1

## 2017-04-19 MED ORDER — LIDOCAINE 5 % EX PTCH: 1.0000 | MEDICATED_PATCH | CUTANEOUS | 0 refills | Status: DC

## 2017-04-19 MED ORDER — METHOCARBAMOL 500 MG PO TABS
500.0000 mg | ORAL_TABLET | Freq: Once | ORAL | Status: AC
  Administered 2017-04-19: 500 mg via ORAL
  Filled 2017-04-19: qty 1

## 2017-04-19 MED ORDER — NAPROXEN 500 MG PO TABS: 500.0000 mg | ORAL_TABLET | Freq: Two times a day (BID) | ORAL | 0 refills | Status: DC | PRN

## 2017-04-19 NOTE — Discharge Instructions (Signed)
For your constipation: stay well hydrated, increase the fiber and water intake in your diet, take miralax as directed in order to achieve daily soft stools. See below for further information regarding constipation and improving bowel health. Follow up with your regular doctor in 1-2 weeks for recheck of this issue. Return to the ER for emergent changes or worsening symptoms  For your Chronic Back Pain: Your back pain should be treated with medicines such as ibuprofen or aleve and this back pain should get better over the next 2 weeks.  However if you develop severe or worsening pain, low back pain with fever, numbness, weakness or inability to walk or urinate, you should return to the ER immediately.  Please follow up with your doctor this week for a recheck if still having symptoms.  Avoid heavy lifting over 10 pounds over the next two weeks.  Low back pain is discomfort in the lower back that may be due to injuries to muscles and ligaments around the spine.  Occasionally, it may be caused by a a problem to a part of the spine called a disc.  The pain may last several days or a week;  However, most patients get completely well in 4 weeks.  Self - care:  The application of heat can help soothe the pain.  Maintaining your daily activities, including walking, is encourged, as it will help you get better faster than just staying in bed. Perform gentle stretching as discussed. Drink plenty of fluids.  Medications are also useful to help with pain control.  A commonly prescribed medication includes over the counter tylenol, take as directed on the over the counter bottle.  Non steroidal anti inflammatory medications including Ibuprofen and naproxen;  These medications help both pain and swelling and are very useful in treating back pain.  They should be taken with food, as they can cause stomach upset, and more seriously, stomach bleeding.    Muscle relaxants:  These medications can help with muscle  tightness that is a cause of lower back pain.  Most of these medications can cause drowsiness, and it is not safe to drive or use dangerous machinery while taking them.  Lidocaine patches: use as directed to help with pain.  Prednisone: this medication will help with the tingling in your leg. Take as directed, daily with breakfast.   SEEK IMMEDIATE MEDICAL ATTENTION IF: New numbness, tingling, weakness, or problem with the use of your arms or legs.  Severe back pain not relieved with medications.  Difficulty with or loss of control of your bowel or bladder control.  Increasing pain in any areas of the body (such as chest or abdominal pain).  Shortness of breath, dizziness or fainting.  Nausea (feeling sick to your stomach), vomiting, fever, or sweats.  You will need to follow up with  Your orthopedist in 1-2 weeks for reassessment.

## 2017-04-19 NOTE — ED Notes (Signed)
Pt in in gown and on monitor

## 2017-04-19 NOTE — ED Triage Notes (Signed)
To ED for eval of lower back pain with radiation to right leg. States this has been going on for months but now getting worse. States she feels constipated and is concerned this is related to back pain. States she has increased urgency with urination since back pain has progressed. Denies numbness

## 2017-04-19 NOTE — ED Provider Notes (Signed)
[facility] DEPT Provider Note   CSN: [id number] Arrival date & time: 04/19/17  1316     History   Chief Complaint Chief Complaint  Patient presents with  . Back Pain    HPI Alicia Blake is a 51 y.o. female with a PMHx of borderline DM2, chronic back pain, DJD low back, asthma, bipolar disorder, DJD L hip s/p THR by Dr. Alain Marion in 04/2016, HTN, alcohol withdrawal seizures, scoliosis, PTSD, who presents to the ED with complaints of gradually worsening chronic back pain that has been present for 1 year. Patient states that her back pain has been worsening as she has to do a lot of heavy lifting when she carries groceries up 2 flights of stairs into her apartment, however she denies any injuries or other reason for her back pain worsening. She is under the care of Dr. Percell Miller for her back, but hasn't seen him recently for this issue. She came in today because her back was bothering her a lot today and she felt like she needed to be seen. She describes her pain as 10/10 intermittent sharp tight and throbbing right lower back pain that radiates into the right leg and groin, worse with walking and "stress", unrelieved with Tylenol, and mildly improved with ice, ibuprofen/Aleve, and an over-the-counter muscle rub. She endorses intermittent tingling down the right leg. She also mentions that she has constipation however she was able to have a small hard bowel movement this morning that she describes as "little nuggets". She was told at the health department that she needed to start taking Miralax however she has not started this yet. Denies any recent falls, states that last week she tripped and almost fell but was able to catch herself on a chair. She states that there have been times recently where she can't get up to the bathroom quick enough because her back is hurting, and she's had some trickling down her leg before she reaches the bathroom, however she denies incontinence of urine or stool aside  from these times when she has difficulty getting to the bathroom quick enough. She denies any saddle anesthesia/cauda equina symptoms. Denies hx of CA or IVDU. She also denies fevers, chills, CP, SOB, abd pain, N/V/D, melena, hematochezia, obstipation, hematuria, dysuria, myalgias, numbness, tingling, focal weakness, or any other complaints at this time.   Of note, chart review reveals she's been seen in the ED multiple times over the last year for her back pain complaint, including most recent visit 03/18/17 during which time she complained of pelvic pain and urinary urgency; pelvic exam was performed that day, STD panel negative, wet prep with clue cells but otherwise unremarkable, labs reassuring and U/A unremarkable. That visit she was also suicidal and eloped during the visit, and EDP IVC'd her after she eloped, but there is no documentation of the fact that the pt ever returned to the ER after her elopement.    The history is provided by the patient and medical records. No language interpreter was used.  Back Pain   This is a chronic problem. The current episode started more than 1 week ago. The problem occurs daily. The problem has been gradually worsening. The pain is associated with lifting heavy objects. The pain is present in the lumbar spine. Quality: sharp, tight, throbbing. The pain radiates to the right thigh. The pain is at a severity of 10/10. The pain is moderate. The symptoms are aggravated by certain positions and stress. The pain is the same all  the time. Associated symptoms include tingling. Pertinent negatives include no chest pain, no fever, no numbness, no abdominal pain, no bowel incontinence, no perianal numbness, no bladder incontinence, no dysuria, no paresis and no weakness. She has tried NSAIDs, ice and analgesics for the symptoms. The treatment provided mild relief. Risk factors include obesity.    Past Medical History:  Diagnosis Date  . Asthma   . Bipolar affect,  depressed ([place])   . Bronchitis   . Chronic back pain   . COPD (chronic obstructive pulmonary disease) ([place])   . Diabetes mellitus without complication ([place])    DIET CONTROLLED entered by office  . DJD (degenerative joint disease)    BACK  . GERD (gastroesophageal reflux disease)   . Hypertension   . Pre-diabetes    dr entered diabetes without complications- no meds does not test sugar at home  . PTSD (post-traumatic stress disorder)   . Scoliosis   . Seizures ([place])    "when drinking" last yrs ago  . Shortness of breath dyspnea    exersion  . Thyroid disease   . UTI (lower urinary tract infection)     Patient Active Problem List   Diagnosis Date Noted  . Tobacco abuse 04/19/2016  . Primary osteoarthritis of left hip 04/18/2016  . Opioid use disorder, mild, abuse 09/08/2015  . Hypokalemia 09/07/2015  . Schizoaffective disorder, bipolar type ([place]) 09/07/2015  . Chronic pain syndrome 09/07/2015  . Alcohol use disorder, moderate, in sustained remission ([place]) 09/07/2015  . Cocaine use disorder, moderate, in sustained remission ([place]) 09/07/2015  . Cannabis use disorder, moderate, in sustained remission ([place]) 09/07/2015  . DDD (degenerative disc disease), lumbar 09/07/2015  . Hx of fracture of ankle 09/07/2015  . Symptomatic Fibroids 08/09/2013  . Breast lump on left side at 9 o'clock position 06/29/2013  . Breast discharge 06/29/2013    Past Surgical History:  Procedure Laterality Date  . 2 LEFT TOES SURGERY  MARCH 2016   right also hammer toes  . BALLOON DILATION N/A 12/14/2015   Procedure: BALLOON DILATION;  Surgeon: Teena Irani, MD;  Location: [facility];  Service: Endoscopy;  Laterality: N/A;  . COLONOSCOPY WITH PROPOFOL N/A 12/14/2015   Procedure: COLONOSCOPY WITH PROPOFOL;  Surgeon: Teena Irani, MD;  Location: [facility];  Service: Endoscopy;  Laterality: N/A;  . ESOPHAGOGASTRODUODENOSCOPY (EGD) WITH PROPOFOL N/A 12/14/2015   Procedure: ESOPHAGOGASTRODUODENOSCOPY (EGD)  WITH PROPOFOL;  Surgeon: Teena Irani, MD;  Location: [facility];  Service: Endoscopy;  Laterality: N/A;  . LEFT ANKLE SURGERY  MARCH 2016  . NECK SURGERY  AS TEENAGER   STITCHES TO NECK   . SURGERY FOR CUT ON STOMACH  TEENAGER  . TOTAL HIP ARTHROPLASTY Left 05/14/2016   Procedure: TOTAL HIP ARTHROPLASTY ANTERIOR APPROACH;  Surgeon: Renette Butters, MD;  Location: [facility];  Service: Orthopedics;  Laterality: Left;    OB History    Gravida Para Term Preterm AB Living   1 1 1     1    SAB TAB Ectopic Multiple Live Births                   Home Medications    Prior to Admission medications   Medication Sig Start Date End Date Taking? Authorizing Provider  albuterol (PROVENTIL HFA;VENTOLIN HFA) 108 (90 BASE) MCG/ACT inhaler Inhale 2 puffs into the lungs every 6 (six) hours as needed for wheezing.    [provider]  aspirin EC 325 MG tablet Take 1 tablet (325 mg total)  by mouth daily. Patient not taking: Reported on 03/18/2017 05/14/16   Renette Butters, MD  benzonatate (TESSALON) 100 MG capsule Take 2 capsules (200 mg total) by mouth 2 (two) times daily as needed for cough. Patient not taking: Reported on 03/18/2017 01/02/17   Nona Dell, PA-C  Chlorphen-Phenyleph-ASA (ALKA-SELTZER PLUS COLD PO) Take 1 tablet by mouth daily as needed (cold symptoms).    [provider]  divalproex (DEPAKOTE) 500 MG DR tablet Take 1 tablet (500 mg total) by mouth every 12 (twelve) hours. Patient not taking: Reported on 03/18/2017 09/12/15   Benjamine Mola, FNP  Docusate Calcium (STOOL SOFTENER PO) Take 1 capsule by mouth daily.    [provider]  docusate sodium (COLACE) 100 MG capsule Take 1 capsule (100 mg total) by mouth 2 (two) times daily. Patient not taking: Reported on 03/18/2017 05/14/16   Renette Butters, MD  FLUoxetine (PROZAC) 20 MG tablet Take 20 mg by mouth daily.    [provider]  hydrOXYzine (ATARAX/VISTARIL) 25 MG tablet Take 1 tablet (25 mg  total) by mouth every 6 (six) hours as needed for anxiety. Patient not taking: Reported on 03/18/2017 09/12/15   Benjamine Mola, FNP  ibuprofen (ADVIL,MOTRIN) 600 MG tablet Take 1 tablet (600 mg total) by mouth every 6 (six) hours as needed. Patient not taking: Reported on 03/18/2017 01/02/17   Nona Dell, PA-C  lisinopril (PRINIVIL,ZESTRIL) 10 MG tablet Take 1 tablet (10 mg total) by mouth daily. Patient not taking: Reported on 11/12/2016 09/12/15   Benjamine Mola, FNP  lithium carbonate (LITHOBID) 300 MG CR tablet Take 300 mg by mouth at bedtime.    [provider]  methocarbamol (ROBAXIN) 500 MG tablet Take 1 tablet (500 mg total) by mouth 2 (two) times daily. Patient not taking: Reported on 03/18/2017 01/02/17   Nona Dell, PA-C  Multiple Vitamin (MULTIVITAMIN) tablet Take 1 tablet by mouth daily.    [provider]  omeprazole (PRILOSEC) 20 MG capsule Take 1 capsule (20 mg total) by mouth daily. 05/14/16   Renette Butters, MD  ondansetron (ZOFRAN) 4 MG tablet Take 1 tablet (4 mg total) by mouth every 8 (eight) hours as needed for nausea or vomiting. Patient not taking: Reported on 11/12/2016 05/14/16   Renette Butters, MD  oxyCODONE-acetaminophen (ROXICET) 5-325 MG tablet Take 1-2 tablets by mouth every 4 (four) hours as needed for severe pain. Patient not taking: Reported on 11/12/2016 05/14/16   Renette Butters, MD  oxymetazoline [facility] NASAL SPRAY) 0.05 % nasal spray Place 1 spray into both nostrils 2 (two) times daily. Spray once into each nostril twice daily for up to the next 3 days. Do not use for more than 3 days to prevent rebound rhinorrhea. Patient not taking: Reported on 03/18/2017 01/02/17   Nona Dell, PA-C  QUEtiapine (SEROQUEL) 300 MG tablet Take 1 tablet (300 mg total) by mouth at bedtime. 09/12/15   [place], Elyse Jarvis, FNP  Solifenacin Succinate (VESICARE PO) Take 1 tablet by mouth daily.    [provider]     Family History Family History  Problem Relation Age of Onset  . Hypertension Mother   . Hypertension Maternal Grandmother   . Hypertension Paternal Grandmother   . Diabetes Paternal Grandmother   . Hypertension Paternal Grandfather   . Diabetes Paternal Grandfather   . Alcohol abuse Paternal Aunt   . Mental illness Cousin   . Heart attack Cousin   . Heart  attack Maternal Uncle     Social History Social History  Substance Use Topics  . Smoking status: Current Every Day Smoker    Packs/day: 0.25    Types: Cigarettes  . Smokeless tobacco: Never Used  . Alcohol use No     Comment: quit date 01/17/2013     Allergies   Buspirone; Crab [shellfish allergy]; Lithium; Other; Meloxicam; and Gabapentin   Review of Systems Review of Systems  Constitutional: Negative for chills and fever.  Respiratory: Negative for shortness of breath.   Cardiovascular: Negative for chest pain.  Gastrointestinal: Positive for constipation (had small BM this morning). Negative for abdominal pain, blood in stool, bowel incontinence, diarrhea, nausea and vomiting.  Genitourinary: Negative for bladder incontinence, difficulty urinating (no incontinence), dysuria and hematuria.  Musculoskeletal: Positive for back pain. Negative for myalgias.  Skin: Negative for color change.  Allergic/Immunologic: Positive for immunocompromised state (borderline DM2).  Neurological: Positive for tingling. Negative for weakness and numbness.       +tingling R leg  Psychiatric/Behavioral: Negative for confusion.   All other systems reviewed and are negative for acute change except as noted in the HPI.    Physical Exam Updated Vital Signs BP 121/76 (BP Location: Left Arm)   Pulse 91   Temp 98.5 F (36.9 C) (Oral)   Resp 18   Ht 5\' 6"  (1.676 m)   Wt 120.2 kg (265 lb)   LMP 12/09/2015   SpO2 98%   BMI 42.77 kg/m   Physical Exam  Constitutional: She is oriented to person, place, and time. Vital signs are  normal. She appears well-developed and well-nourished.  Non-toxic appearance. No distress.  Afebrile, nontoxic, NAD  HENT:  Head: Normocephalic and atraumatic.  Mouth/Throat: Oropharynx is clear and moist and mucous membranes are normal.  Eyes: Conjunctivae and EOM are normal. Right eye exhibits no discharge. Left eye exhibits no discharge.  Neck: Normal range of motion. Neck supple.  Cardiovascular: Normal rate, regular rhythm, normal heart sounds and intact distal pulses.  Exam reveals no gallop and no friction rub.   No murmur heard. Pulmonary/Chest: Effort normal and breath sounds normal. No respiratory distress. She has no decreased breath sounds. She has no wheezes. She has no rhonchi. She has no rales.  Abdominal: Soft. Normal appearance and bowel sounds are normal. She exhibits no distension. There is no tenderness. There is no rigidity, no rebound, no guarding, no CVA tenderness, no tenderness at McBurney's point and negative Murphy's sign.  Soft, obese which limits exam slightly but overall nondistended, nontender, +BS throughout, no r/g/r, neg murphy's, neg mcburney's, no CVA TTP   Musculoskeletal: Normal range of motion.       Lumbar back: She exhibits tenderness and spasm. She exhibits normal range of motion, no bony tenderness and no deformity.       Back:  Lumbar spine with FROM intact without spinous process TTP, no bony stepoffs or deformities, with mild R sided buttock/paraspinous muscle TTP and slight L sided paraspinous muscle TTP, mild R sided paraspinous muscle spasms palpable but none really felt in the L side. Strength and sensation grossly intact in all extremities, +SLR of R side, gait steady and nonantalgic. No overlying skin changes. Distal pulses intact.   Neurological: She is alert and oriented to person, place, and time. She has normal strength. No sensory deficit.  Skin: Skin is warm, dry and intact. No rash noted.  Psychiatric: She has a normal mood and affect.   Nursing note and vitals reviewed.  ED Treatments / Results  Labs (all labs ordered are listed, but only abnormal results are displayed) Labs Reviewed - No data to display  EKG  EKG Interpretation None       Radiology No results found.   XR L SPINE 4 OR 5 VIEWS 03/16/2015 @[facility] Result Narrative   EXAM: XR Lumbar Spine, 4 View.   CLINICAL HISTORY: 4 ddd l spine, ap lateral, flex ext  COMPARISON: None provided.  BONES: No acute fracture or focal osseous lesion. Bony alignment is anatomic.  DISCS / DEGENERATIVE CHANGES: Moderate disc height loss is noted at L4-5 and L5-S1 with associated endplate  osteophytosis. The degeneration noted from L2-S1.   SOFT TISSUES: The soft tissues are unremarkable.   MISCELLANEOUS: No instability between flexion and extension.   IMPRESSION: 1. No instability between flexion and extension.  2. Moderate lower lumbar degenerative changes.     Procedures Procedures (including critical care time)  Medications Ordered in ED Medications  lidocaine (LIDODERM) 5 % 1 patch (1 patch Transdermal Patch Applied 04/19/17 1453)  ketorolac (TORADOL) 30 MG/ML injection 30 mg (30 mg Intramuscular Given 04/19/17 1447)  methocarbamol (ROBAXIN) tablet 500 mg (500 mg Oral Given 04/19/17 1447)     Initial Impression / Assessment and Plan / ED Course  I have reviewed the triage vital signs and the nursing notes.  Pertinent labs & imaging results that were available during my care of the patient were reviewed by me and considered in my medical decision making (see chart for details).     51 y.o. female here with chronic low back pain, this time stating it's on her R lower back/buttock radiating into R groin area; multiple ED visits in the last 1 year with similar complaints. Also reports constipation but had BM this morning. On exam, mild R buttock/lumbar paraspinous muscle TTP and spasm, some mild L lumbar paraspinous muscle tenderness  as well; no midline spinal tenderness, +SLR on R side. No red flag s/s of low back pain. No s/s of central cord compression or cauda equina. Lower extremities are neurovascularly intact and patient is ambulating without difficulty. No urinary complaints reported to me. Had xray of low back in 2016 which showed mild DDD. Follows with Dr. Percell Miller now, has not had MRI to date, however doubt need for emergent MRI imaging at this time. Doubt need for imaging/labs, this is likely just acute on chronic musculoskeletal back pain vs hip pain referring to the back.    Patient was counseled on back pain precautions and told to do activity as tolerated but do not lift, push, or pull heavy objects more than 10 pounds for the next week. Patient counseled to use ice or heat on back for no longer than 15 minutes every hour.   Rx given for muscle relaxer and counseled on proper use of muscle relaxant medication. Urged patient not to drink alcohol, drive, or perform any other activities that requires focus while taking these medications. Rx for naprosyn given. Advised tylenol use as well. Will also give rx for lidoderm patches and prednisone for short course to see if this will help; ultimately, I advised that she needs to go back to Dr. Percell Miller for ongoing management of her chronic back pain.   As for her constipation, doubt this is related since she has no s/sx of cauda equina syndrome and continues to be continent, and able to pass BM. No abdominal tenderness on exam. Advised miralax and increased fiber/water intake. F/up with PCP in  1wk for recheck of this.   Patient urged to follow-up with Dr. Percell Miller in 1 week for recheck of ongoing back pain, or sooner if pain does not improve with treatment and rest or if pain becomes recurrent. Urged to return with worsening severe pain, loss of bowel or bladder control, trouble walking. The patient verbalizes understanding and agrees with the plan.    Final Clinical Impressions(s) /  ED Diagnoses   Final diagnoses:  Chronic right-sided low back pain with right-sided sciatica  Muscle spasm of back  Constipation, unspecified constipation type  Right leg paresthesias    New Prescriptions New Prescriptions   LIDOCAINE (LIDODERM) 5 %    Place 1 patch onto the skin daily. Remove & Discard patch within 12 hours or as directed by MD   METHOCARBAMOL (ROBAXIN) 500 MG TABLET    Take 1 tablet (500 mg total) by mouth every 8 (eight) hours as needed for muscle spasms.   NAPROXEN (NAPROSYN) 500 MG TABLET    Take 1 tablet (500 mg total) by mouth 2 (two) times daily as needed for mild pain, moderate pain or headache (TAKE WITH MEALS.).   POLYETHYLENE GLYCOL (MIRALAX / GLYCOLAX) PACKET    Take 17 g by mouth daily.   PREDNISONE (DELTASONE) 20 MG TABLET    3 tabs po daily x 4 days     [address] [place], [place] 04/19/17 1504    Forde Dandy, MD 04/19/17 [phone]

## 2017-04-23 ENCOUNTER — Ambulatory Visit (HOSPITAL_COMMUNITY)
Admission: RE | Admit: 2017-04-23 | Discharge: 2017-04-23 | Disposition: A | Discharge status: Home / Self Care | Payer: Medicaid Other | Source: Ambulatory Visit | Attending: Neurosurgery | Admitting: Neurosurgery

## 2017-04-23 DIAGNOSIS — G8929 Other chronic pain: Secondary | ICD-10-CM

## 2017-04-23 DIAGNOSIS — M48061 Spinal stenosis, lumbar region without neurogenic claudication: Secondary | ICD-10-CM | POA: Diagnosis not present

## 2017-04-23 DIAGNOSIS — M5441 Lumbago with sciatica, right side: Secondary | ICD-10-CM

## 2017-04-23 DIAGNOSIS — M5126 Other intervertebral disc displacement, lumbar region: Secondary | ICD-10-CM | POA: Diagnosis not present

## 2017-04-23 DIAGNOSIS — M5136 Other intervertebral disc degeneration, lumbar region: Secondary | ICD-10-CM | POA: Diagnosis not present

## 2017-04-23 MED ORDER — LIDOCAINE HCL 1 % IJ SOLN
INTRAMUSCULAR | Status: AC
  Filled 2017-04-23: qty 10

## 2017-04-23 MED ORDER — ONDANSETRON HCL 4 MG/2ML IJ SOLN: 4.0000 mg | Freq: Four times a day (QID) | INTRAMUSCULAR | Status: DC | PRN

## 2017-04-23 MED ORDER — LIDOCAINE HCL (PF) 1 % IJ SOLN
10.0000 mL | Freq: Once | INTRAMUSCULAR | Status: AC
  Administered 2017-04-23: 15 mL via INTRADERMAL

## 2017-04-23 MED ORDER — DIAZEPAM 5 MG PO TABS
ORAL_TABLET | ORAL | Status: AC
  Administered 2017-04-23: 10 mg via ORAL
  Filled 2017-04-23: qty 2

## 2017-04-23 MED ORDER — IOPAMIDOL (ISOVUE-M 200) INJECTION 41%
20.0000 mL | Freq: Once | INTRAMUSCULAR | Status: AC
  Administered 2017-04-23: 20 mL via INTRATHECAL

## 2017-04-23 MED ORDER — DIAZEPAM 5 MG PO TABS
10.0000 mg | ORAL_TABLET | Freq: Once | ORAL | Status: AC
  Administered 2017-04-23: 10 mg via ORAL
  Filled 2017-04-23: qty 2

## 2017-04-23 MED ORDER — OXYCODONE HCL 5 MG PO TABS
ORAL_TABLET | ORAL | Status: AC
  Filled 2017-04-23: qty 1

## 2017-04-23 MED ORDER — OXYCODONE HCL 5 MG PO TABS
5.0000 mg | ORAL_TABLET | ORAL | Status: DC | PRN
  Administered 2017-04-23: 5 mg via ORAL

## 2017-04-23 NOTE — Discharge Instructions (Signed)
Myelogram and Lumbar Puncture Discharge Instructions ° °1. Go home and rest quietly for the next 24 hours.  It is important to lie flat for the next 24 hours.  Get up only to go to the restroom.  You may lie in the bed or on a couch on your back, your stomach, your left side or your right side.  You may have one pillow under your head.  You may have pillows between your knees while you are on your side or under your knees while you are on your back. ° °2. DO NOT drive today.  Recline the seat as far back as it will go, while still wearing your seat belt, on the way home. ° °3. You may get up to go to the bathroom as needed.  You may sit up for 10 minutes to eat.  You may resume your normal diet and medications unless otherwise indicated. ° °4. The incidence of headache, nausea, or vomiting is about 5% (one in 20 patients).  If you develop a headache, lie flat and drink plenty of fluids until the headache goes away.  Caffeinated beverages may be helpful.  If you develop severe nausea and vomiting or a headache that does not go away with flat bed rest, call [phone]. ° °5. You may resume normal activities after your 24 hours of bed rest is over; however, do not exert yourself strongly or do any heavy lifting tomorrow. ° °6. Call your physician for a follow-up appointment.  The results of your myelogram will be sent directly to your physician by the following day. ° °7. If you have any questions or if complications develop after you arrive home, please call [phone]. ° °Discharge instructions have been explained to the patient.  The patient, or the person responsible for the patient, fully understands these instructions. ° ° °

## 2017-05-14 ENCOUNTER — Other Ambulatory Visit: Payer: Self-pay | Admitting: Neurosurgery

## 2017-06-13 NOTE — Pre-Procedure Instructions (Signed)
OCTAVIA VELADOR  06/13/2017      [facility] [place] [address] [place]) [place] [address] Phone: [phone] Fax: [place], [place] [address] [place] [address] Phone: [phone] Fax: [phone]  [place] [address] Phone: [phone] Fax: [phone]    Your procedure is scheduled on August 22  Report to [facility] at [employer] A.M.  Call this number if you have problems the morning of surgery:  [phone]   Remember:  Do not eat food or drink liquids after midnight.  Continue all other medications as directed by your physician except follow these instructions about you medications   Take these medicines the morning of surgery with A SIP OF WATER albuterol (PROVENTIL HFA;VENTOLIN HFA), albuterol (PROVENTIL) (2.5 MG/3ML) neb, atenolol-chlorthalidone (TENORETIC),  Fluticasone-Salmeterol (ADVAIR), mirabegron ER (MYRBETRIQ), omeprazole (PRILOSEC) , PARoxetine (PAXIL) , QUEtiapine (SEROQUEL)  7 days prior to surgery STOP taking any Aleve, Naproxen, Ibuprofen, Motrin, Advil, Goody's, BC's, all herbal medications, fish oil, and all vitamins, meloxicam (MOBIC)   Follow your doctors instructions regarding your Aspirin.  If no instructions were given by the doctor you will need to call the office to get instructions.  Your pre admission RN will also call for those instructions    Do not wear jewelry, make-up or nail polish.  Do not wear lotions, powders, or perfumes, or deoderant.  Do not shave 48 hours prior to surgery.  Do not bring valuables to the hospital.  [facility] is not responsible for any belongings or valuables.  Contacts, dentures or bridgework may not be worn into surgery.  Leave your suitcase in the car.  After surgery it may be brought to  your room.  For patients admitted to the hospital, discharge time will be determined by your treatment team.  Patients discharged the day of surgery will not be allowed to drive home.    Special instructions:   [facility]- Preparing For Surgery  Before surgery, you can play an important role. Because skin is not sterile, your skin needs to be as free of germs as possible. You can reduce the number of germs on your skin by washing with CHG (chlorahexidine gluconate) Soap before surgery.  CHG is an antiseptic cleaner which kills germs and bonds with the skin to continue killing germs even after washing.  Please do not use if you have an allergy to CHG or antibacterial soaps. If your skin becomes reddened/irritated stop using the CHG.  Do not shave (including legs and underarms) for at least 48 hours prior to first CHG shower. It is OK to shave your face.  Please follow these instructions carefully.   1. Shower the NIGHT BEFORE SURGERY and the MORNING OF SURGERY with CHG.   2. If you chose to wash your hair, wash your hair first as usual with your normal shampoo.  3. After you shampoo, rinse your hair and body thoroughly to remove the shampoo.  4. Use CHG as you would any other liquid soap. You can apply CHG directly to the skin and wash gently with a scrungie or a clean washcloth.   5. Apply the CHG Soap to your body ONLY FROM THE NECK DOWN.  Do not use on open wounds or open sores. Avoid contact with your eyes, ears, mouth and genitals (private parts). Wash  genitals (private parts) with your normal soap.  6. Wash thoroughly, paying special attention to the area where your surgery will be performed.  7. Thoroughly rinse your body with warm water from the neck down.  8. DO NOT shower/wash with your normal soap after using and rinsing off the CHG Soap.  9. Pat yourself dry with a CLEAN TOWEL.   10. Wear CLEAN PAJAMAS   11. Place CLEAN SHEETS on your bed the night of your first  shower and DO NOT SLEEP WITH PETS.    Day of Surgery: Do not apply any deodorants/lotions. Please wear clean clothes to the hospital/surgery center.      Please read over the following fact sheets that you were given.

## 2017-06-16 ENCOUNTER — Encounter (HOSPITAL_COMMUNITY)
Admission: RE | Admit: 2017-06-16 | Discharge: 2017-06-16 | Disposition: A | Discharge status: Home / Self Care | Payer: Medicaid Other | Source: Ambulatory Visit | Attending: Neurosurgery | Admitting: Neurosurgery

## 2017-06-16 ENCOUNTER — Encounter (HOSPITAL_COMMUNITY): Payer: Self-pay | Admitting: *Deleted

## 2017-06-16 DIAGNOSIS — Z01818 Encounter for other preprocedural examination: Secondary | ICD-10-CM | POA: Insufficient documentation

## 2017-06-16 LAB — COMPREHENSIVE METABOLIC PANEL
ALT: 14 U/L (ref 14–54)
AST: 14 U/L — AB (ref 15–41)
Albumin: 3.3 g/dL — ABNORMAL LOW (ref 3.5–5.0)
Alkaline Phosphatase: 74 U/L (ref 38–126)
Anion gap: 5 (ref 5–15)
BILIRUBIN TOTAL: 0.5 mg/dL (ref 0.3–1.2)
BUN: 14 mg/dL (ref 6–20)
CO2: 30 mmol/L (ref 22–32)
CREATININE: 1.21 mg/dL — AB (ref 0.44–1.00)
Calcium: 9.3 mg/dL (ref 8.9–10.3)
Chloride: 99 mmol/L — ABNORMAL LOW (ref 101–111)
GFR calc Af Amer: 59 mL/min — ABNORMAL LOW (ref >60)
GFR, EST NON AFRICAN AMERICAN: 51 mL/min — AB (ref >60)
GLUCOSE: 101 mg/dL — AB (ref 65–99)
Potassium: 3.6 mmol/L (ref 3.5–5.1)
Sodium: 134 mmol/L — ABNORMAL LOW (ref 135–145)
Total Protein: 7.6 g/dL (ref 6.5–8.1)

## 2017-06-16 LAB — CBC
HEMATOCRIT: 33.8 % — AB (ref 36.0–46.0)
HEMOGLOBIN: 11 g/dL — AB (ref 12.0–15.0)
MCH: 29.2 pg (ref 26.0–34.0)
MCHC: 32.5 g/dL (ref 30.0–36.0)
MCV: 89.7 fL (ref 78.0–100.0)
Platelets: 246 10*3/uL (ref 150–400)
RBC: 3.77 MIL/uL — AB (ref 3.87–5.11)
RDW: 15.4 % (ref 11.5–15.5)
WBC: 8.8 10*3/uL (ref 4.0–10.5)

## 2017-06-16 LAB — SURGICAL PCR SCREEN
MRSA, PCR: NEGATIVE
Staphylococcus aureus: NEGATIVE

## 2017-06-16 LAB — GLUCOSE, CAPILLARY: Glucose-Capillary: 113 mg/dL — ABNORMAL HIGH (ref 65–99)

## 2017-06-16 LAB — HEMOGLOBIN A1C
HEMOGLOBIN A1C: 5.8 % — AB (ref 4.8–5.6)
Mean Plasma Glucose: 119.76 mg/dL

## 2017-06-16 LAB — HCG, SERUM, QUALITATIVE: Preg, Serum: NEGATIVE

## 2017-06-16 NOTE — Progress Notes (Signed)
   06/16/17 1142  OBSTRUCTIVE SLEEP APNEA  Have you ever been diagnosed with sleep apnea through a sleep study? No (was suppose to be tested sometime in the future)  Do you snore loudly (loud enough to be heard through closed doors)?  1  Do you often feel tired, fatigued, or sleepy during the daytime (such as falling asleep during driving or talking to someone)? 1  Has anyone observed you stop breathing during your sleep? 1  Do you have, or are you being treated for high blood pressure? 1  Age > 38 (1-yes) 1  Female Gender (Yes=1) 0  Obstructive Sleep Apnea Score 5  Score 5 or greater  Results sent to PCP

## 2017-06-16 NOTE — Progress Notes (Signed)
Denies any cardiac issues or tests.  Not sure if she has had stress test, back in 2014, as she changed from stress to ekg.  I have faxed request to [facility] for anything cardiac related. She does have alittle bit of congestion -- states its mainly sinus related.  Denies temp, denies any discolored drainage. PCP is Dr. Iona Beard Osei-Bonsu  O'Donnell 04/2017

## 2017-06-17 MED ORDER — CEFAZOLIN SODIUM-DEXTROSE 2-4 GM/100ML-% IV SOLN
2.0000 g | INTRAVENOUS | Status: AC
  Administered 2017-06-18: 2 g via INTRAVENOUS
  Filled 2017-06-17: qty 100

## 2017-06-17 NOTE — Progress Notes (Signed)
Anesthesia Chart Review: Patient is a 51 year old female scheduled for laminectomy and foraminotomy L4-5 on 06/18/17 by Dr. Ashok Pall.   PMH includes:  HTN, DM2 (diet controlled), asthma, thyroid disease, PTSD, scoliosis, COPD, seizures (with alcohol years ago), GERD, left THA 05/14/16. Current smoker. BMI 41.88, consistent with morbid obesity. OSA screening score is 5.  - PAT RN requested records from [facility] which indicate that patient was seen in the ED for back pain and cough on 06/12/17. Lungs sounds were documented as clear without wheezes, rales, or rhonchi. CXR did not show any PNA. Magnesium (1.4) and potassium were supplemented. Troponin 0.00. BNP 29. She was prescribed Cefdinir and prednisone taper for acute bronchitis and back pain which she said she did not fill because she has an "allergy" to prednisone (reported "mouth swelling"). She was not sure if the prednisone was for her back pain or not (lungs sounds were clear so my thought is that it was for her back pain, but unsure). She did not have take the antibiotic because she felt her primary symptom was the back pain and was just having "cold" symptoms without fever. She now reports she feels well--denied fever, cough, SOB. (I left a voice message with Nicki at Dr. Lacy Duverney office regarding events.)    - PCP is Dr. Salena Saner. - She was evaluated by cardiologist Dr. Lauree Chandler on 04/22/16 for evaluation of chest pain and surgical clearance for left THA. She was ultimately cleared for that surgery following stress and echo (see below).   Medications include: albuterol, ASA (not taking), atenolol-chlorthalidone, Depakote, Advair, lithium carbonate, losartan, Myrbetriq, Singulair, Prilosec, Paxil, Seroquel.    BP (!) 110/52   Pulse 79   Temp 36.8 C   Resp 20   Ht 5\' 6"  (1.676 m)   Wt 259 lb 8 oz (117.7 kg)   LMP 12/09/2015 Comment: very irregular  SpO2 97%   BMI 41.88 kg/m   EKG 06/16/17: NSR, low  voltage QRS.  Nuclear stress test 04/25/16:   There was no ST segment deviation noted during stress.  No T wave inversion was noted during stress.  The study is normal.  The left ventricular ejection fraction is hyperdynamic (>65%).  Nuclear stress EF: 72%. 1. No ischemia or infarction by perfusion images. n 2. Normal EF and wall motion.   Echo 04/25/16:  - Left ventricle: The cavity size was normal. Wall thickness was normal. Systolic function was normal. The estimated ejection fraction was in the range of 55% to 60%. Wall motion was normal; there were no regional wall motion abnormalities. Doppler parameters are consistent with abnormal left ventricular relaxation (grade 1 diastolic dysfunction). - Aortic valve: There was no stenosis. - Mitral valve: There was trivial regurgitation. - Right ventricle: The cavity size was normal. Systolic function was normal. - Pulmonary arteries: PA peak pressure: 27 mm Hg (S). - Inferior vena cava: The vessel was normal in size. The respirophasic diameter changes were in the normal range (= 50%), consistent with normal central venous pressure. - Pericardium, extracardiac: Small circumferential pericardial effusion, no tamponade.  1V CXR 06/12/17 [facility]): Impression: Stable chest without evidence of acute cardiopulmonary disease given. 06/04/17: IMPRESSION: No acute cardiopulmonary disease.  Preoperative labs reviewed.  HgbA1c 5.8. Cr 1.21. H/H 11.0/33.8. PLT 246K. Serum pregnancy negative.   Patient denied any acute respiratory symptoms when I spoke with her on the phone today. I was not asked to evaluate her at PAT. She will be evaluated by her anesthesiologist  on the day of surgery. If no acute cardiopulmonary issues or other new changes then I anticipate that she can proceed as planned.  George Hugh [facility] Short Stay Center/Anesthesiology Phone [phone] 06/17/2017 12:17 PM

## 2017-06-18 ENCOUNTER — Ambulatory Visit (HOSPITAL_COMMUNITY)
Admission: RE | Admit: 2017-06-18 | Discharge: 2017-06-19 | Disposition: A | Discharge status: Home / Self Care | Payer: Medicaid Other | Source: Ambulatory Visit | Attending: Neurosurgery | Admitting: Neurosurgery

## 2017-06-18 ENCOUNTER — Encounter (HOSPITAL_COMMUNITY): Payer: Self-pay | Admitting: Certified Registered"

## 2017-06-18 ENCOUNTER — Ambulatory Visit (HOSPITAL_COMMUNITY): Payer: Medicaid Other | Admitting: Vascular Surgery

## 2017-06-18 ENCOUNTER — Encounter (HOSPITAL_COMMUNITY)
Admission: RE | Disposition: A | Discharge status: Home / Self Care | Payer: Self-pay | Source: Ambulatory Visit | Attending: Neurosurgery

## 2017-06-18 ENCOUNTER — Ambulatory Visit (HOSPITAL_COMMUNITY): Payer: Medicaid Other

## 2017-06-18 ENCOUNTER — Ambulatory Visit (HOSPITAL_COMMUNITY): Payer: Medicaid Other | Admitting: Certified Registered"

## 2017-06-18 DIAGNOSIS — F319 Bipolar disorder, unspecified: Secondary | ICD-10-CM | POA: Diagnosis not present

## 2017-06-18 DIAGNOSIS — Z91013 Allergy to seafood: Secondary | ICD-10-CM | POA: Insufficient documentation

## 2017-06-18 DIAGNOSIS — Z888 Allergy status to other drugs, medicaments and biological substances status: Secondary | ICD-10-CM | POA: Diagnosis not present

## 2017-06-18 DIAGNOSIS — K219 Gastro-esophageal reflux disease without esophagitis: Secondary | ICD-10-CM | POA: Insufficient documentation

## 2017-06-18 DIAGNOSIS — Z6841 Body Mass Index (BMI) 40.0 and over, adult: Secondary | ICD-10-CM | POA: Diagnosis not present

## 2017-06-18 DIAGNOSIS — Z79899 Other long term (current) drug therapy: Secondary | ICD-10-CM | POA: Insufficient documentation

## 2017-06-18 DIAGNOSIS — M48062 Spinal stenosis, lumbar region with neurogenic claudication: Secondary | ICD-10-CM | POA: Diagnosis present

## 2017-06-18 DIAGNOSIS — Z7982 Long term (current) use of aspirin: Secondary | ICD-10-CM | POA: Insufficient documentation

## 2017-06-18 DIAGNOSIS — I10 Essential (primary) hypertension: Secondary | ICD-10-CM | POA: Insufficient documentation

## 2017-06-18 DIAGNOSIS — F17200 Nicotine dependence, unspecified, uncomplicated: Secondary | ICD-10-CM | POA: Diagnosis not present

## 2017-06-18 DIAGNOSIS — E119 Type 2 diabetes mellitus without complications: Secondary | ICD-10-CM | POA: Insufficient documentation

## 2017-06-18 DIAGNOSIS — J45909 Unspecified asthma, uncomplicated: Secondary | ICD-10-CM | POA: Insufficient documentation

## 2017-06-18 DIAGNOSIS — M5117 Intervertebral disc disorders with radiculopathy, lumbosacral region: Secondary | ICD-10-CM | POA: Diagnosis not present

## 2017-06-18 DIAGNOSIS — M5136 Other intervertebral disc degeneration, lumbar region: Secondary | ICD-10-CM | POA: Insufficient documentation

## 2017-06-18 DIAGNOSIS — Z419 Encounter for procedure for purposes other than remedying health state, unspecified: Secondary | ICD-10-CM

## 2017-06-18 HISTORY — PX: LUMBAR LAMINECTOMY/DECOMPRESSION MICRODISCECTOMY: SHX5026

## 2017-06-18 LAB — GLUCOSE, CAPILLARY
GLUCOSE-CAPILLARY: 167 mg/dL — AB (ref 65–99)
Glucose-Capillary: 137 mg/dL — ABNORMAL HIGH (ref 65–99)

## 2017-06-18 SURGERY — LUMBAR LAMINECTOMY/DECOMPRESSION MICRODISCECTOMY 1 LEVEL
Anesthesia: General | Site: Spine Lumbar

## 2017-06-18 MED ORDER — KETOROLAC TROMETHAMINE 15 MG/ML IJ SOLN
15.0000 mg | Freq: Four times a day (QID) | INTRAMUSCULAR | Status: DC
  Administered 2017-06-18 – 2017-06-19 (×3): 15 mg via INTRAVENOUS
  Filled 2017-06-18 (×3): qty 1

## 2017-06-18 MED ORDER — HYDROMORPHONE HCL 1 MG/ML IJ SOLN
INTRAMUSCULAR | Status: AC
  Administered 2017-06-18: 0.5 mg via INTRAVENOUS
  Filled 2017-06-18: qty 1

## 2017-06-18 MED ORDER — FENTANYL CITRATE (PF) 250 MCG/5ML IJ SOLN
INTRAMUSCULAR | Status: AC
  Filled 2017-06-18: qty 5

## 2017-06-18 MED ORDER — SODIUM CHLORIDE 0.9% FLUSH: 3.0000 mL | Freq: Two times a day (BID) | INTRAVENOUS | Status: DC

## 2017-06-18 MED ORDER — LIDOCAINE 2% (20 MG/ML) 5 ML SYRINGE
INTRAMUSCULAR | Status: DC | PRN
  Administered 2017-06-18: 100 mg via INTRAVENOUS

## 2017-06-18 MED ORDER — SODIUM CHLORIDE 0.9% FLUSH: 3.0000 mL | INTRAVENOUS | Status: DC | PRN

## 2017-06-18 MED ORDER — HYDROXYZINE HCL 25 MG PO TABS
25.0000 mg | ORAL_TABLET | Freq: Four times a day (QID) | ORAL | Status: DC | PRN
  Administered 2017-06-19: 25 mg via ORAL
  Filled 2017-06-18: qty 1

## 2017-06-18 MED ORDER — FENTANYL CITRATE (PF) 100 MCG/2ML IJ SOLN
INTRAMUSCULAR | Status: DC | PRN
Start: 2017-06-18 — End: 2017-06-18
  Administered 2017-06-18: 100 ug via INTRAVENOUS
  Administered 2017-06-18: 50 ug via INTRAVENOUS

## 2017-06-18 MED ORDER — ATENOLOL 50 MG PO TABS
50.0000 mg | ORAL_TABLET | Freq: Every day | ORAL | Status: DC
  Filled 2017-06-18 (×2): qty 1

## 2017-06-18 MED ORDER — DOCUSATE SODIUM 100 MG PO CAPS
100.0000 mg | ORAL_CAPSULE | Freq: Every day | ORAL | Status: DC
  Administered 2017-06-18 – 2017-06-19 (×2): 100 mg via ORAL
  Filled 2017-06-18: qty 1

## 2017-06-18 MED ORDER — BENZONATATE 100 MG PO CAPS: 200.0000 mg | ORAL_CAPSULE | Freq: Two times a day (BID) | ORAL | Status: DC | PRN

## 2017-06-18 MED ORDER — METHOCARBAMOL 500 MG PO TABS
500.0000 mg | ORAL_TABLET | Freq: Two times a day (BID) | ORAL | Status: DC
  Administered 2017-06-18 – 2017-06-19 (×2): 500 mg via ORAL
  Filled 2017-06-18 (×2): qty 1

## 2017-06-18 MED ORDER — PHENYLEPHRINE 40 MCG/ML (10ML) SYRINGE FOR IV PUSH (FOR BLOOD PRESSURE SUPPORT)
PREFILLED_SYRINGE | INTRAVENOUS | Status: AC
  Filled 2017-06-18: qty 10

## 2017-06-18 MED ORDER — VITAMIN D 1000 UNITS PO TABS
1000.0000 [IU] | ORAL_TABLET | Freq: Every day | ORAL | Status: DC
  Administered 2017-06-19: 1000 [IU] via ORAL
  Filled 2017-06-18: qty 1

## 2017-06-18 MED ORDER — MENTHOL 3 MG MT LOZG: 1.0000 | LOZENGE | OROMUCOSAL | Status: DC | PRN

## 2017-06-18 MED ORDER — PROPOFOL 10 MG/ML IV BOLUS
INTRAVENOUS | Status: AC
  Filled 2017-06-18: qty 20

## 2017-06-18 MED ORDER — ONDANSETRON HCL 4 MG/2ML IJ SOLN
INTRAMUSCULAR | Status: DC | PRN
  Administered 2017-06-18: 4 mg via INTRAVENOUS

## 2017-06-18 MED ORDER — OXYCODONE HCL 5 MG PO TABS
ORAL_TABLET | ORAL | Status: AC
  Administered 2017-06-18: 10 mg
  Filled 2017-06-18: qty 2

## 2017-06-18 MED ORDER — PROMETHAZINE HCL 25 MG/ML IJ SOLN: 6.2500 mg | INTRAMUSCULAR | Status: DC | PRN

## 2017-06-18 MED ORDER — ONDANSETRON HCL 4 MG PO TABS: 4.0000 mg | ORAL_TABLET | Freq: Three times a day (TID) | ORAL | Status: DC | PRN

## 2017-06-18 MED ORDER — CHLORHEXIDINE GLUCONATE CLOTH 2 % EX PADS: 6.0000 | MEDICATED_PAD | Freq: Once | CUTANEOUS | Status: DC

## 2017-06-18 MED ORDER — SUGAMMADEX SODIUM 200 MG/2ML IV SOLN
INTRAVENOUS | Status: DC | PRN
  Administered 2017-06-18: 240 mg via INTRAVENOUS

## 2017-06-18 MED ORDER — MIRABEGRON ER 25 MG PO TB24
25.0000 mg | ORAL_TABLET | Freq: Every day | ORAL | Status: DC
  Administered 2017-06-18 – 2017-06-19 (×2): 25 mg via ORAL
  Filled 2017-06-18 (×2): qty 1

## 2017-06-18 MED ORDER — THROMBIN 5000 UNITS EX SOLR
CUTANEOUS | Status: DC | PRN
  Administered 2017-06-18 (×2): 5000 [IU] via TOPICAL

## 2017-06-18 MED ORDER — SODIUM CHLORIDE 0.9 % IV SOLN: 250.0000 mL | INTRAVENOUS | Status: DC

## 2017-06-18 MED ORDER — LACTATED RINGERS IV SOLN: INTRAVENOUS | Status: DC

## 2017-06-18 MED ORDER — SUGAMMADEX SODIUM 500 MG/5ML IV SOLN
INTRAVENOUS | Status: AC
  Filled 2017-06-18: qty 5

## 2017-06-18 MED ORDER — ACETAMINOPHEN 650 MG RE SUPP: 650.0000 mg | RECTAL | Status: DC | PRN

## 2017-06-18 MED ORDER — ROCURONIUM BROMIDE 10 MG/ML (PF) SYRINGE
PREFILLED_SYRINGE | INTRAVENOUS | Status: DC | PRN
  Administered 2017-06-18: 70 mg via INTRAVENOUS

## 2017-06-18 MED ORDER — LISINOPRIL 20 MG PO TABS: 10.0000 mg | ORAL_TABLET | Freq: Every day | ORAL | Status: DC

## 2017-06-18 MED ORDER — OXYMETAZOLINE HCL 0.05 % NA SOLN
1.0000 | Freq: Two times a day (BID) | NASAL | Status: DC
  Administered 2017-06-18 – 2017-06-19 (×2): 1 via NASAL
  Filled 2017-06-18: qty 15

## 2017-06-18 MED ORDER — ALBUTEROL SULFATE HFA 108 (90 BASE) MCG/ACT IN AERS
INHALATION_SPRAY | RESPIRATORY_TRACT | Status: AC
  Filled 2017-06-18: qty 6.7

## 2017-06-18 MED ORDER — POLYETHYLENE GLYCOL 3350 17 G PO PACK
17.0000 g | PACK | Freq: Every day | ORAL | Status: DC
  Administered 2017-06-18 – 2017-06-19 (×2): 17 g via ORAL
  Filled 2017-06-18 (×2): qty 1

## 2017-06-18 MED ORDER — PAROXETINE HCL 30 MG PO TABS
30.0000 mg | ORAL_TABLET | Freq: Every day | ORAL | Status: DC
  Administered 2017-06-19: 30 mg via ORAL
  Filled 2017-06-18 (×2): qty 1

## 2017-06-18 MED ORDER — THROMBIN 5000 UNITS EX SOLR
CUTANEOUS | Status: AC
  Filled 2017-06-18: qty 10000

## 2017-06-18 MED ORDER — DEXAMETHASONE SODIUM PHOSPHATE 10 MG/ML IJ SOLN
INTRAMUSCULAR | Status: DC | PRN
  Administered 2017-06-18: 10 mg via INTRAVENOUS

## 2017-06-18 MED ORDER — ACETAMINOPHEN 325 MG PO TABS: 650.0000 mg | ORAL_TABLET | ORAL | Status: DC | PRN

## 2017-06-18 MED ORDER — HYDROCODONE-ACETAMINOPHEN 5-325 MG PO TABS
1.0000 | ORAL_TABLET | ORAL | Status: DC | PRN
  Administered 2017-06-19: 2 via ORAL
  Filled 2017-06-18: qty 2

## 2017-06-18 MED ORDER — LITHIUM CARBONATE ER 450 MG PO TBCR
450.0000 mg | EXTENDED_RELEASE_TABLET | Freq: Every day | ORAL | Status: DC
  Administered 2017-06-18: 450 mg via ORAL
  Filled 2017-06-18: qty 1

## 2017-06-18 MED ORDER — MONTELUKAST SODIUM 10 MG PO TABS
10.0000 mg | ORAL_TABLET | Freq: Every day | ORAL | Status: DC
  Administered 2017-06-19: 10 mg via ORAL
  Filled 2017-06-18 (×2): qty 1

## 2017-06-18 MED ORDER — SALINE SPRAY 0.65 % NA SOLN
1.0000 | Freq: Two times a day (BID) | NASAL | Status: DC | PRN
  Administered 2017-06-18: 1 via NASAL
  Filled 2017-06-18: qty 44

## 2017-06-18 MED ORDER — ONDANSETRON HCL 4 MG PO TABS: 4.0000 mg | ORAL_TABLET | Freq: Four times a day (QID) | ORAL | Status: DC | PRN

## 2017-06-18 MED ORDER — LOSARTAN POTASSIUM 50 MG PO TABS: 50.0000 mg | ORAL_TABLET | Freq: Every day | ORAL | Status: DC

## 2017-06-18 MED ORDER — MOMETASONE FURO-FORMOTEROL FUM 200-5 MCG/ACT IN AERO
2.0000 | INHALATION_SPRAY | Freq: Two times a day (BID) | RESPIRATORY_TRACT | Status: DC
  Administered 2017-06-18 – 2017-06-19 (×2): 2 via RESPIRATORY_TRACT
  Filled 2017-06-18: qty 8.8

## 2017-06-18 MED ORDER — QUETIAPINE FUMARATE 300 MG PO TABS
300.0000 mg | ORAL_TABLET | Freq: Every day | ORAL | Status: DC
  Filled 2017-06-18: qty 1

## 2017-06-18 MED ORDER — POTASSIUM CHLORIDE IN NACL 20-0.9 MEQ/L-% IV SOLN
INTRAVENOUS | Status: DC
  Administered 2017-06-18: 21:00:00 via INTRAVENOUS

## 2017-06-18 MED ORDER — PHENYLEPHRINE HCL 10 MG/ML IJ SOLN
INTRAMUSCULAR | Status: AC
  Filled 2017-06-18: qty 1

## 2017-06-18 MED ORDER — PROPOFOL 10 MG/ML IV BOLUS
INTRAVENOUS | Status: DC | PRN
  Administered 2017-06-18: 200 mg via INTRAVENOUS

## 2017-06-18 MED ORDER — CHLORTHALIDONE 25 MG PO TABS
25.0000 mg | ORAL_TABLET | Freq: Every day | ORAL | Status: DC
  Administered 2017-06-18: 25 mg via ORAL
  Filled 2017-06-18 (×2): qty 1

## 2017-06-18 MED ORDER — LACTATED RINGERS IV SOLN
INTRAVENOUS | Status: DC
  Administered 2017-06-18 (×2): via INTRAVENOUS

## 2017-06-18 MED ORDER — 0.9 % SODIUM CHLORIDE (POUR BTL) OPTIME
TOPICAL | Status: DC | PRN
  Administered 2017-06-18: 1000 mL

## 2017-06-18 MED ORDER — MEPERIDINE HCL 25 MG/ML IJ SOLN: 6.2500 mg | INTRAMUSCULAR | Status: DC | PRN

## 2017-06-18 MED ORDER — ASPIRIN EC 325 MG PO TBEC: 325.0000 mg | DELAYED_RELEASE_TABLET | Freq: Every day | ORAL | Status: DC

## 2017-06-18 MED ORDER — ONDANSETRON HCL 4 MG/2ML IJ SOLN
INTRAMUSCULAR | Status: AC
  Filled 2017-06-18: qty 2

## 2017-06-18 MED ORDER — ADULT MULTIVITAMIN W/MINERALS CH
1.0000 | ORAL_TABLET | Freq: Every day | ORAL | Status: DC
  Administered 2017-06-19: 1 via ORAL
  Filled 2017-06-18: qty 1

## 2017-06-18 MED ORDER — EPHEDRINE SULFATE-NACL 50-0.9 MG/10ML-% IV SOSY
PREFILLED_SYRINGE | INTRAVENOUS | Status: DC | PRN
  Administered 2017-06-18 (×3): 10 mg via INTRAVENOUS

## 2017-06-18 MED ORDER — QUETIAPINE FUMARATE 400 MG PO TABS
400.0000 mg | ORAL_TABLET | Freq: Every day | ORAL | Status: DC
  Filled 2017-06-18: qty 1

## 2017-06-18 MED ORDER — ATENOLOL-CHLORTHALIDONE 50-25 MG PO TABS: 1.0000 | ORAL_TABLET | Freq: Every day | ORAL | Status: DC

## 2017-06-18 MED ORDER — PHENYLEPHRINE 40 MCG/ML (10ML) SYRINGE FOR IV PUSH (FOR BLOOD PRESSURE SUPPORT)
PREFILLED_SYRINGE | INTRAVENOUS | Status: DC | PRN
  Administered 2017-06-18: 80 ug via INTRAVENOUS
  Administered 2017-06-18: 160 ug via INTRAVENOUS
  Administered 2017-06-18: 40 ug via INTRAVENOUS
  Administered 2017-06-18: 120 ug via INTRAVENOUS

## 2017-06-18 MED ORDER — ONDANSETRON HCL 4 MG/2ML IJ SOLN: 4.0000 mg | Freq: Four times a day (QID) | INTRAMUSCULAR | Status: DC | PRN

## 2017-06-18 MED ORDER — MIDAZOLAM HCL 2 MG/2ML IJ SOLN
INTRAMUSCULAR | Status: AC
  Filled 2017-06-18: qty 2

## 2017-06-18 MED ORDER — PANTOPRAZOLE SODIUM 40 MG PO TBEC
40.0000 mg | DELAYED_RELEASE_TABLET | Freq: Every day | ORAL | Status: DC
  Administered 2017-06-18 – 2017-06-19 (×2): 40 mg via ORAL
  Filled 2017-06-18 (×2): qty 1

## 2017-06-18 MED ORDER — ALBUTEROL SULFATE HFA 108 (90 BASE) MCG/ACT IN AERS: 2.0000 | INHALATION_SPRAY | Freq: Four times a day (QID) | RESPIRATORY_TRACT | Status: DC | PRN

## 2017-06-18 MED ORDER — DEXAMETHASONE SODIUM PHOSPHATE 10 MG/ML IJ SOLN
INTRAMUSCULAR | Status: AC
  Filled 2017-06-18: qty 1

## 2017-06-18 MED ORDER — HEMOSTATIC AGENTS (NO CHARGE) OPTIME
TOPICAL | Status: DC | PRN
  Administered 2017-06-18: 1 via TOPICAL

## 2017-06-18 MED ORDER — LIDOCAINE-EPINEPHRINE 0.5 %-1:200000 IJ SOLN
INTRAMUSCULAR | Status: DC | PRN
  Administered 2017-06-18: 7 mL

## 2017-06-18 MED ORDER — ALBUTEROL SULFATE (2.5 MG/3ML) 0.083% IN NEBU: 2.5000 mg | INHALATION_SOLUTION | Freq: Four times a day (QID) | RESPIRATORY_TRACT | Status: DC | PRN

## 2017-06-18 MED ORDER — ROCURONIUM BROMIDE 10 MG/ML (PF) SYRINGE
PREFILLED_SYRINGE | INTRAVENOUS | Status: AC
  Filled 2017-06-18: qty 5

## 2017-06-18 MED ORDER — SCOPOLAMINE 1 MG/3DAYS TD PT72
1.0000 | MEDICATED_PATCH | TRANSDERMAL | Status: DC
  Administered 2017-06-18: 1.5 mg via TRANSDERMAL
  Filled 2017-06-18: qty 1

## 2017-06-18 MED ORDER — MORPHINE SULFATE (PF) 4 MG/ML IV SOLN
2.0000 mg | INTRAVENOUS | Status: DC | PRN
Start: 2017-06-18 — End: 2017-06-19

## 2017-06-18 MED ORDER — HYDROMORPHONE HCL 1 MG/ML IJ SOLN
0.2500 mg | INTRAMUSCULAR | Status: DC | PRN
  Administered 2017-06-18 (×3): 0.5 mg via INTRAVENOUS

## 2017-06-18 MED ORDER — EPHEDRINE 5 MG/ML INJ
INTRAVENOUS | Status: AC
  Filled 2017-06-18: qty 10

## 2017-06-18 MED ORDER — ALBUTEROL SULFATE HFA 108 (90 BASE) MCG/ACT IN AERS
INHALATION_SPRAY | RESPIRATORY_TRACT | Status: DC | PRN
  Administered 2017-06-18: 2 via RESPIRATORY_TRACT

## 2017-06-18 MED ORDER — LIDOCAINE-EPINEPHRINE 0.5 %-1:200000 IJ SOLN
INTRAMUSCULAR | Status: AC
  Filled 2017-06-18: qty 1

## 2017-06-18 MED ORDER — OXYCODONE HCL ER 10 MG PO T12A
10.0000 mg | EXTENDED_RELEASE_TABLET | Freq: Two times a day (BID) | ORAL | Status: DC
  Administered 2017-06-18 – 2017-06-19 (×2): 10 mg via ORAL
  Filled 2017-06-18 (×2): qty 1

## 2017-06-18 MED ORDER — LIDOCAINE 2% (20 MG/ML) 5 ML SYRINGE
INTRAMUSCULAR | Status: AC
  Filled 2017-06-18: qty 5

## 2017-06-18 MED ORDER — DIVALPROEX SODIUM 500 MG PO DR TAB
500.0000 mg | DELAYED_RELEASE_TABLET | Freq: Two times a day (BID) | ORAL | Status: DC
Start: 2017-06-18 — End: 2017-06-18
  Filled 2017-06-18: qty 1

## 2017-06-18 MED ORDER — VITAMIN D3 25 MCG (1000 UT) PO CAPS
1000.0000 [IU] | ORAL_CAPSULE | Freq: Every day | ORAL | Status: DC
  Filled 2017-06-18: qty 1

## 2017-06-18 MED ORDER — MIDAZOLAM HCL 2 MG/2ML IJ SOLN
INTRAMUSCULAR | Status: DC | PRN
  Administered 2017-06-18: 2 mg via INTRAVENOUS

## 2017-06-18 MED ORDER — HYDROMORPHONE HCL 1 MG/ML IJ SOLN: 0.2500 mg | INTRAMUSCULAR | Status: DC | PRN

## 2017-06-18 MED ORDER — DEXTROSE 5 % IV SOLN
INTRAVENOUS | Status: DC | PRN
  Administered 2017-06-18: 20 ug/min via INTRAVENOUS

## 2017-06-18 MED ORDER — PHENOL 1.4 % MT LIQD: 1.0000 | OROMUCOSAL | Status: DC | PRN

## 2017-06-18 SURGICAL SUPPLY — 52 items
BAG DECANTER FOR FLEXI CONT (MISCELLANEOUS) ×3 IMPLANT
BENZOIN TINCTURE PRP APPL 2/3 (GAUZE/BANDAGES/DRESSINGS) IMPLANT
BUR MATCHSTICK NEURO 3.0 LAGG (BURR) ×3 IMPLANT
BUR PRECISION FLUTE 5.0 (BURR) ×3 IMPLANT
CANISTER SUCT 3000ML PPV (MISCELLANEOUS) ×3 IMPLANT
CARTRIDGE OIL MAESTRO DRILL (MISCELLANEOUS) ×1 IMPLANT
CLOSURE WOUND 1/2 X4 (GAUZE/BANDAGES/DRESSINGS)
DECANTER SPIKE VIAL GLASS SM (MISCELLANEOUS) ×3 IMPLANT
DERMABOND ADVANCED (GAUZE/BANDAGES/DRESSINGS) ×2
DERMABOND ADVANCED .7 DNX12 (GAUZE/BANDAGES/DRESSINGS) ×1 IMPLANT
DIFFUSER DRILL AIR PNEUMATIC (MISCELLANEOUS) ×3 IMPLANT
DRAPE LAPAROTOMY 100X72X124 (DRAPES) ×3 IMPLANT
DRAPE MICROSCOPE LEICA (MISCELLANEOUS) ×3 IMPLANT
DRAPE POUCH INSTRU U-SHP 10X18 (DRAPES) ×3 IMPLANT
DRAPE SURG 17X23 STRL (DRAPES) ×3 IMPLANT
DURAPREP 26ML APPLICATOR (WOUND CARE) ×3 IMPLANT
ELECT REM PT RETURN 9FT ADLT (ELECTROSURGICAL) ×3
ELECTRODE REM PT RTRN 9FT ADLT (ELECTROSURGICAL) ×1 IMPLANT
GAUZE SPONGE 4X4 12PLY STRL (GAUZE/BANDAGES/DRESSINGS) IMPLANT
GAUZE SPONGE 4X4 16PLY XRAY LF (GAUZE/BANDAGES/DRESSINGS) IMPLANT
GLOVE BIOGEL PI IND STRL 7.0 (GLOVE) ×2 IMPLANT
GLOVE BIOGEL PI IND STRL 7.5 (GLOVE) ×2 IMPLANT
GLOVE BIOGEL PI INDICATOR 7.0 (GLOVE) ×4
GLOVE BIOGEL PI INDICATOR 7.5 (GLOVE) ×4
GLOVE ECLIPSE 6.5 STRL STRAW (GLOVE) ×3 IMPLANT
GLOVE EXAM NITRILE LRG STRL (GLOVE) IMPLANT
GLOVE EXAM NITRILE XL STR (GLOVE) IMPLANT
GLOVE EXAM NITRILE XS STR PU (GLOVE) IMPLANT
GOWN STRL REUS W/ TWL LRG LVL3 (GOWN DISPOSABLE) ×2 IMPLANT
GOWN STRL REUS W/ TWL XL LVL3 (GOWN DISPOSABLE) IMPLANT
GOWN STRL REUS W/TWL 2XL LVL3 (GOWN DISPOSABLE) IMPLANT
GOWN STRL REUS W/TWL LRG LVL3 (GOWN DISPOSABLE) ×4
GOWN STRL REUS W/TWL XL LVL3 (GOWN DISPOSABLE)
KIT BASIN OR (CUSTOM PROCEDURE TRAY) ×3 IMPLANT
KIT ROOM TURNOVER OR (KITS) ×3 IMPLANT
NEEDLE HYPO 25X1 1.5 SAFETY (NEEDLE) ×3 IMPLANT
NEEDLE SPNL 18GX3.5 QUINCKE PK (NEEDLE) IMPLANT
NS IRRIG 1000ML POUR BTL (IV SOLUTION) ×3 IMPLANT
OIL CARTRIDGE MAESTRO DRILL (MISCELLANEOUS) ×3
PACK LAMINECTOMY NEURO (CUSTOM PROCEDURE TRAY) ×3 IMPLANT
PAD ARMBOARD 7.5X6 YLW CONV (MISCELLANEOUS) ×9 IMPLANT
RUBBERBAND STERILE (MISCELLANEOUS) ×6 IMPLANT
SPONGE LAP 4X18 X RAY DECT (DISPOSABLE) IMPLANT
SPONGE SURGIFOAM ABS GEL SZ50 (HEMOSTASIS) ×3 IMPLANT
STRIP CLOSURE SKIN 1/2X4 (GAUZE/BANDAGES/DRESSINGS) IMPLANT
SUT VIC AB 0 CT1 18XCR BRD8 (SUTURE) ×1 IMPLANT
SUT VIC AB 0 CT1 8-18 (SUTURE) ×2
SUT VIC AB 2-0 CT1 18 (SUTURE) ×3 IMPLANT
SUT VIC AB 3-0 SH 8-18 (SUTURE) ×3 IMPLANT
TOWEL GREEN STERILE (TOWEL DISPOSABLE) ×3 IMPLANT
TOWEL GREEN STERILE FF (TOWEL DISPOSABLE) ×3 IMPLANT
WATER STERILE IRR 1000ML POUR (IV SOLUTION) ×3 IMPLANT

## 2017-06-18 NOTE — Transfer of Care (Signed)
Immediate Anesthesia Transfer of Care Note  Patient: Alicia Blake  Procedure(s) Performed: Procedure(s) with comments: LAMINECTOMY AND FORAMINOTOMY LUMBAR FOUR - LUMBAR FIVE (N/A) - LAMINECTOMY AND FORAMINOTOMY LUMBAR 4- LUMBAR 5  Patient Location: PACU  Anesthesia Type:General  Level of Consciousness: lethargic and responds to stimulation  Airway & Oxygen Therapy: Patient Spontanous Breathing and Patient connected to face mask oxygen  Post-op Assessment: Report given to RN and Patient moving all extremities X 4  Post vital signs: Reviewed and stable  Last Vitals:  Vitals:   06/18/17 1223  BP: (!) 115/55  Pulse: 78  Resp: 20  Temp: 36.8 C  SpO2: 97%    Last Pain:  Vitals:   06/18/17 1223  TempSrc: Oral         Complications: No apparent anesthesia complications

## 2017-06-18 NOTE — Anesthesia Procedure Notes (Signed)
Procedure Name: Intubation Date/Time: 06/18/2017 4:34 PM Performed by: Barrington Ellison Pre-anesthesia Checklist: Patient identified, Emergency Drugs available, Suction available and Patient being monitored Patient Re-evaluated:Patient Re-evaluated prior to induction Oxygen Delivery Method: Circle System Utilized Preoxygenation: Pre-oxygenation with 100% oxygen Induction Type: IV induction Ventilation: Mask ventilation without difficulty Laryngoscope Size: Miller and 2 Grade View: Grade III Tube type: Oral Tube size: 7.0 mm Number of attempts: 3 Airway Equipment and Method: Stylet and Oral airway Placement Confirmation: ETT inserted through vocal cords under direct vision,  positive ETCO2 and breath sounds checked- equal and bilateral Secured at: 21 cm Tube secured with: Tape Dental Injury: Teeth and Oropharynx as per pre-operative assessment  Difficulty Due To: Difficulty was unanticipated and Difficult Airway- due to anterior larynx Future Recommendations: Recommend- induction with short-acting agent, and alternative techniques readily available Comments: Dl x 1 by CRNA with Mac 3, Grade 4 view, DL x 2 by MDA, 1st attempt with Mil 2- ETT entered esophagus, 2nd Attempt successful with Mil 2 Grade 3 view.

## 2017-06-18 NOTE — Anesthesia Preprocedure Evaluation (Signed)
Anesthesia Evaluation  Patient identified by MRN, date of birth, ID band Patient awake    Reviewed: Allergy & Precautions, H&P , NPO status , Patient's Chart, lab work & pertinent test results  History of Anesthesia Complications Negative for: history of anesthetic complications  Airway Mallampati: II   Neck ROM: full    Dental no notable dental hx. (+) Dental Advisory Given   Pulmonary shortness of breath, asthma , COPD, Current Smoker,    breath sounds clear to auscultation       Cardiovascular hypertension,  Rhythm:regular Rate:Normal     Neuro/Psych PSYCHIATRIC DISORDERS Depression Bipolar Disorder    GI/Hepatic GERD  ,  Endo/Other  diabetes, Type 2Morbid obesity  Renal/GU      Musculoskeletal  (+) Arthritis ,   Abdominal   Peds  Hematology   Anesthesia Other Findings   Reproductive/Obstetrics                             Anesthesia Physical  Anesthesia Plan  ASA: III  Anesthesia Plan:    Post-op Pain Management:    Induction: Intravenous  PONV Risk Score and Plan: 3 and Ondansetron, Dexamethasone and Scopolamine patch - Pre-op  Airway Management Planned: Oral ETT  Additional Equipment:   Intra-op Plan:   Post-operative Plan: Extubation in OR  Informed Consent: I have reviewed the patients History and Physical, chart, labs and discussed the procedure including the risks, benefits and alternatives for the proposed anesthesia with the patient or authorized representative who has indicated his/her understanding and acceptance.   Dental advisory given  Plan Discussed with: Anesthesiologist, CRNA and Surgeon  Anesthesia Plan Comments:         Anesthesia Quick Evaluation

## 2017-06-18 NOTE — H&P (Addendum)
BP (!) 115/55   Pulse 78   Temp 98.3 F (36.8 C) (Oral)   Resp 20   Wt 117.5 kg (259 lb)   SpO2 97%   BMI 41.80 kg/m  Alicia Blake is a 51 year old woman who presents today for evaluation of pain that she has in the back, which radiates into the right buttock and into the right lower extremity. She has pain in the left knee. She has pain in the left ankle. The left ankle pain is due to a fracture, which did not heal correctly. The knee pain is due to something else. She has also undergone a left hip replacement recently, in July of last year. She has had ankle surgery in 2013 by Dr. Rea College and she has had toe surgery, of which she has a problematic toe in the left foot and the right foot, also with Dr. Rea College. She says that her lower right back, hip, middle and she says spinal cord started hurting in 2013. She says it has been nagging for years but has gotten worse. She did receive injections by Dr. Ron Agee, but lasted only for a brief period of time. She says she is hoping that she can get surgery to relieve the pain, but she is not sure that that will. She says the pain will shoot through and it is a very sharp pain. It shoots through the right hip and back. She feels weakness in the legs and left ankle, numbness and tingling, headaches, loss of control of bowel. She says she cannot make to the bathroom, especially at night. Says her weight goes up and down. REVIEW OF SYSTEMS: Positive for fever, night sweats, infections, hearing loss, tinnitus, balance problems, anosmia, sinus problems, sore throat, mouth sores, chronic cough, shortness of breath, bronchitis, chest pain, irregular pulse, hypercholesterolemia, swelling in the feet, leg pain with walking, nausea, vomiting, peptic ulcer disease due to gastritis, abdominal pain, urinary tract infections, painful urination, difficulty with urinary stream, arthritis, neck pain, arm weakness, leg weakness, back pain, arm pain, leg pain, joint pain, breast  pain, difficulty with speech, blurred vision, problems with coordination, anxiety, depression, other psychiatric disorder, thyroid disease, excessive thirst, anemia, swollen glands, inhalant allergies. MEDICATIONS: Include: Paxil, lithium, hydrazine, amperine, omeprazole, Seroquel, Singulair, a stool softener, vitamin D, and vitamin D3, One-A-Day vitamin. ALLERGIES: She is allergic to risperidone, buspirone and crab legs. PHYSICAL EXAM: General: She is obese. She is alert, oriented by 4, answering all questions appropriately. Skin: She has a healing sore in the midline in the lumbar spine. She is not sure where this came from. Neurologic: She has 5/5 strength in the upper and lower extremities. Intact proprioception. Normal muscle tone and bulk. I did not elicit reflexes at the knees. She had 2+ at the left ankles, normal in the upper extremities. No Hoffmann sign. No clonus. Memory, language, attention span and fund of knowledge are normal. Speech is clear. It is also fluent. Tongue and uvula in midline. Shoulder shrug is normal. Vital signs: Height 5 feet 5 inches, weight 262.6 pounds, temperature is 97.9, blood pressure 113/76, pulse is 87. Pain is 10/10. ASSESSMENT AND PLAN: MRI is reviewed, does not show severe foraminal narrowing at any level. She has some mild degenerative disc changes present in the lumbar spine. She does not have any specific entity which is going to account for 10/10 pain in the lower extremities. She does have some Modic changes present. Given the obesity, the low back pain is certainly a reasonable thing to  occurred, but again 10/10 seems to be much for what I am looking at. Nonetheless, the scan is from October and is certainly more than 6 months old. She did not progress through the injections. What I would like to do is get a myelogram post myelogram CT to see what she looks like right now and to see if that will be helpful, and I will see her in the office after that has been  done. She did ask for pain medication, but since I am simply the consultant on this case and have no idea whether or not there will be any definitive treatment, I will not be prescribing pain medication. Ms. Wilmot returns today after the myelogram, post myelogram CT. What it does show is that she has some narrowing present at L4-5. It seems to be worse now than it was when she underwent the MRI. I will offer a lumbar decompression at L4-5 to decompress the L4 root to L5 root. OBJECTIVE:  PLAN: I explained a simple decompression, removing the bone and ligament. Risks, including bleeding, infection, and relief, need for further surgery were discussed along with other risks. She understands and wishes to proceed.

## 2017-06-19 ENCOUNTER — Encounter (HOSPITAL_COMMUNITY): Payer: Self-pay | Admitting: Neurosurgery

## 2017-06-19 DIAGNOSIS — M48062 Spinal stenosis, lumbar region with neurogenic claudication: Secondary | ICD-10-CM | POA: Diagnosis not present

## 2017-06-19 LAB — GLUCOSE, CAPILLARY: Glucose-Capillary: 169 mg/dL — ABNORMAL HIGH (ref 65–99)

## 2017-06-19 MED ORDER — TIZANIDINE HCL 4 MG PO TABS: 4.0000 mg | ORAL_TABLET | Freq: Four times a day (QID) | ORAL | 0 refills | Status: DC | PRN

## 2017-06-19 MED ORDER — OXYCODONE HCL 5 MG PO TABS
5.0000 mg | ORAL_TABLET | Freq: Four times a day (QID) | ORAL | 0 refills | Status: DC | PRN
Start: 2017-06-19 — End: 2018-04-22

## 2017-06-19 MED ORDER — DIPHENHYDRAMINE HCL 25 MG PO CAPS: 25.0000 mg | ORAL_CAPSULE | Freq: Once | ORAL | Status: DC

## 2017-06-19 MED ORDER — WHITE PETROLATUM GEL
Status: AC
  Administered 2017-06-19: 12:00:00
  Filled 2017-06-19: qty 1

## 2017-06-19 NOTE — Op Note (Signed)
06/18/2017  10:46 AM  PATIENT:  Alicia Blake  51 y.o. female  PRE-OPERATIVE DIAGNOSIS:  LUMBAGO WITH SCIATICA, RIGHT SIDE  POST-OPERATIVE DIAGNOSIS:  LUMBAGO WITH SCIATICA, RIGHT SIDE  PROCEDURE:  Procedure(s): LAMINECTOMY AND FORAMINOTOMY LUMBAR FOUR - LUMBAR FIVE  SURGEON: Surgeon(s): Ashok Pall, MD Kary Kos, MD  ASSISTANTS:Cram, Dominica Severin  ANESTHESIA:   general  EBL:  No intake/output data recorded.  BLOOD ADMINISTERED:none  CELL SAVER GIVEN:none  COUNT:per nursing  DRAINS: none   SPECIMEN:  No Specimen  DICTATION: EMALIA WITKOP was taken to the operating room, intubated, and placed under a general anesthetic without difficulty. She was positioned prone on a wilson frame with all pressure points padded. Her lumbar region was prepped and draped in a sterile manner. I used preoperative xrays to localize the incision. I infiltrated lidocaine into the planned incision. I opened the skin with a 10 blade and dissected sharply to expose the thoracolumbar fascia. I used monopolar cautery and exposed the L4, and L5  Lamina. I confirmed my location with an intraoperative xray. I used the drill and Kerrison punches to perform a semihemilaminectomy of L4. I used the Kerrison punches to remove more bone and to decompress the L4 and L5 roots. I did not open the disc space. Dr. Saintclair Halsted assisted with the decompression of the spinal canal and the nerve roots. We irrigated after a final inspection of the nerve roots and the canal. I then closed the wound in layers approximating the thoracolumbar fascia, subcutaneous, and subcuticular layers with vicryl sutures. (I applied a sterile dressing, using dermabond.   PLAN OF CARE: Admit for overnight observation  PATIENT DISPOSITION:  PACU - hemodynamically stable.   Delay start of Pharmacological VTE agent (>24hrs) due to surgical blood loss or risk of bleeding:  yes

## 2017-06-19 NOTE — Progress Notes (Signed)
Orthopedic Tech Progress Note Patient Details:  Alicia Blake 1966/07/06 [id number]  Patient ID: [name], female   DOB: Mar 15, 1966, 51 y.o.   MRN: [id number]   Hildred Priest 06/19/2017, 10:42 AM Called in bio-tech brace order; spoke with Ronalee Belts

## 2017-06-19 NOTE — Evaluation (Signed)
Physical Therapy Evaluation and Discharge Patient Details Name: Alicia Blake MRN: [id number] DOB: 07/03/1966 Today's Date: 06/19/2017   History of Present Illness  s/p lumbar decompression at L4/5 on the right. PMH includes HTN, asthma, SOB, COPD, depression, bipolar disorder, DM, and current smoker.   Clinical Impression  Patient evaluated by Physical Therapy with no further acute PT needs identified. All education has been completed and the patient has no further questions. PTA, pt was using rollator for mobility. Upon eval, pt requiring supervision to min guard assist for gait and stair navigation. Pt reports comfort with all mobility and all education completed. See below for any follow-up Physical Therapy or equipment needs. PT is signing off. Thank you for this referral.     Follow Up Recommendations No PT follow up    Equipment Recommendations  Rolling walker with 5" wheels;3in1 (PT)    Recommendations for Other Services       Precautions / Restrictions Precautions Precautions: Back Precaution Booklet Issued: Yes (comment) Precaution Comments: Pt able to recall 1/3 precautions. Reviewed back precautions with pt.  Required Braces or Orthoses:  (order says no brace, however, pt requesting for comfort. ) Restrictions Weight Bearing Restrictions: No      Mobility  Bed Mobility Overal bed mobility: Modified Independent             General bed mobility comments: Standing in hall upon beginning of session.   Transfers Overall transfer level: Modified independent Equipment used: None             General transfer comment: from EOB 3x  Ambulation/Gait Ambulation/Gait assistance: Supervision Ambulation Distance (Feet): 50 Feet Assistive device: None Gait Pattern/deviations: Step-through pattern;Decreased stride length Gait velocity: Decreased Gait velocity interpretation: Below normal speed for age/gender General Gait Details: Slow, cautious gait, however,  overall steady. Pt refusing to walk further and stating "she doesn't want to over-do it." Encouraged mobility throughout the day and educated about home walking exercise program.   Stairs Stairs: Yes Stairs assistance: Min guard Stair Management: One rail Left;Step to pattern;Sideways (use of BUE on one rail ) Number of Stairs: 10 General stair comments: Educated about stair navigation technique and use of step to pattern to increase safety. Verbal cues for LE sequencing and use of BUE on L rail.   Wheelchair Mobility    Modified Rankin (Stroke Patients Only)       Balance Overall balance assessment: Needs assistance Sitting-balance support: No upper extremity supported;Feet supported Sitting balance-Leahy Scale: Good     Standing balance support: During functional activity;No upper extremity supported Standing balance-Leahy Scale: Fair Standing balance comment: no AD used during session.                              Pertinent Vitals/Pain Pain Assessment: Faces Faces Pain Scale: No hurt    Home Living Family/patient expects to be discharged to:: Private residence Living Arrangements: Alone Available Help at Discharge: Family;Other (Comment) Type of Home: Apartment Home Access: Stairs to enter Entrance Stairs-Rails: Left Entrance Stairs-Number of Steps: 10 Home Layout: One level Home Equipment: Walker - 4 wheels Additional Comments: Pt states her rollator will not fit in her apartment, uses furniture for external support..    Prior Function Level of Independence: Independent with assistive device(s)         Comments: Uses rollator      Hand Dominance        Extremity/Trunk Assessment   Upper  Extremity Assessment Upper Extremity Assessment: Defer to OT evaluation    Lower Extremity Assessment Lower Extremity Assessment: Overall WFL for tasks assessed       Communication   Communication: No difficulties  Cognition Arousal/Alertness:  Awake/alert Behavior During Therapy: Anxious;WFL for tasks assessed/performed;Impulsive                                   General Comments: Tangental responses at time. Easily distracted. Impulsive.       General Comments General comments (skin integrity, edema, etc.): Pt's mother present during session.     Exercises     Assessment/Plan    PT Assessment Patent does not need any further PT services  PT Problem List         PT Treatment Interventions      PT Goals (Current goals can be found in the Care Plan section)  Acute Rehab PT Goals Patient Stated Goal: to go home  PT Goal Formulation: With patient Time For Goal Achievement: 06/19/17 Potential to Achieve Goals: Good    Frequency     Barriers to discharge        Co-evaluation               AM-PAC PT "6 Clicks" Daily Activity  Outcome Measure Difficulty turning over in bed (including adjusting bedclothes, sheets and blankets)?: A Little Difficulty moving from lying on back to sitting on the side of the bed? : A Little Difficulty sitting down on and standing up from a chair with arms (e.g., wheelchair, bedside commode, etc,.)?: A Little Help needed moving to and from a bed to chair (including a wheelchair)?: A Little Help needed walking in hospital room?: A Little Help needed climbing 3-5 steps with a railing? : A Little 6 Click Score: 18    End of Session Equipment Utilized During Treatment: Gait belt;Back brace Activity Tolerance: Patient tolerated treatment well Patient left: in bed;with call bell/phone within reach;with family/visitor present Nurse Communication: Mobility status PT Visit Diagnosis: Other abnormalities of gait and mobility (R26.89)    Time: 6387-5643 PT Time Calculation (min) (ACUTE ONLY): 17 min   Charges:   PT Evaluation $PT Eval Low Complexity: 1 Low     PT G Codes:   PT G-Codes **NOT FOR INPATIENT CLASS** Functional Assessment Tool Used: Clinical  judgement;AM-PAC 6 Clicks Basic Mobility Functional Limitation: Mobility: Walking and moving around Mobility: Walking and Moving Around Current Status (P2951): At least 40 percent but less than 60 percent impaired, limited or restricted Mobility: Walking and Moving Around Goal Status [phone]): At least 40 percent but less than 60 percent impaired, limited or restricted Mobility: Walking and Moving Around Discharge Status [phone]): At least 40 percent but less than 60 percent impaired, limited or restricted    Leighton Ruff, PT, DPT  Acute Rehabilitation Services  Pager: [place] 06/19/2017, 1:43 PM

## 2017-06-19 NOTE — Discharge Instructions (Signed)
Lumbar Discectomy °Care After °A discectomy involves removal of discmaterial (the cartilage-like structures located between the bones of the back). It is done to relieve pressure on nerve roots. It can be used as a treatment for a back problem. The time in surgery depends on the findings in surgery and what is necessary to correct the problems. °HOME CARE INSTRUCTIONS  °· Check the cut (incision) made by the surgeon twice a day for signs of infection. Some signs of infection may include:  °· A foul smelling, greenish or yellowish discharge from the wound.  °· Increased pain.  °· Increased redness over the incision (operative) site.  °· The skin edges may separate.  °· Flu-like symptoms (problems).  °· A temperature above 101.5° F (38.6° C).  °· Change your bandages in about 24 to 36 hours following surgery or as directed.  °· You may shower tomrrow.  Avoid bathtubs, swimming pools and hot tubs for three weeks or until your incision has healed completely. °· Follow your doctor's instructions as to safe activities, exercises, and physical therapy.  °· Weight reduction may be beneficial if you are overweight.  °· Daily exercise is helpful to prevent the return of problems. Walking is permitted. You may use a treadmill without an incline. Cut down on activities and exercise if you have discomfort. You may also go up and down stairs as much as you can tolerate.  °· DO NOT lift anything heavier than 10 to 15 lbs. Avoid bending or twisting at the waist. Always bend your knees when lifting.  °· Maintain strength and range of motion as instructed.  °· Do not drive for 10 days, or as directed by your doctors. You may be a passenger . Lying back in the passenger seat may be more comfortable for you. Always wear a seatbelt.  °· Limit your sitting in a regular chair to 20 to 30 minutes at a time. There are no limitations for sitting in a recliner. You should lie down or walk in between sitting periods.  °· Only take  over-the-counter or prescription medicines for pain, discomfort, or fever as directed by your caregiver.  °SEEK MEDICAL CARE IF:  °· There is increased bleeding (more than a small spot) from the wound.  °· You notice redness, swelling, or increasing pain in the wound.  °· Pus is coming from wound.  °· You develop an unexplained oral temperature above 102° F (38.9° C) develops.  °· You notice a foul smell coming from the wound or dressing.  °· You have increasing pain in your wound.  °SEEK IMMEDIATE MEDICAL CARE IF:  °· You develop a rash.  °· You have difficulty breathing.  °· You develop any allergic problems to medicines given.  °Document Released: 09/18/2004 Document Revised: 10/03/2011 Document Reviewed: 01/07/2008 °[employer]® Patient Information °

## 2017-06-19 NOTE — Discharge Summary (Signed)
BP 99/61   Pulse 66   Temp 98.6 F (37 C) (Oral)   Resp 18   Wt 117.5 kg (259 lb)   SpO2 95%   BMI 41.80 kg/m  Physician Discharge Summary  Patient ID: Alicia Blake MRN: [id number] DOB/AGE: 04-22-1966 51 y.o.  Admit date: 06/18/2017 Discharge date: 06/19/2017  Admission Diagnoses:Lumbar stenosis, L4/5  Discharge Diagnoses:  Active Problems:   Lumbar stenosis with neurogenic claudication   Discharged Condition: good  Hospital Course: Alicia Blake was admitted and taken to the operating room for an uncomplicated lumbar decompression at L4/5 on the right. Her wound is clean,dry, and without signs of infection. She is ambulating, voiding, and tolerating a regular diet.  She will be discharged to home.   Treatments: surgery: Right L4/5 lumbar semihemilaminectomy  And foraminotomy  Discharge Exam: Blood pressure 99/61, pulse 66, temperature 98.6 F (37 C), temperature source Oral, resp. rate 18, weight 117.5 kg (259 lb), SpO2 95 %. General appearance: alert, cooperative, appears stated age and mild distress Neurologic: Alert and oriented X 3, normal strength and tone. Normal symmetric reflexes. Normal coordination and gait  Disposition: 01-Home or Self Care LUMBAGO WITH SCIATICA, RIGHT SIDE  Allergies as of 06/19/2017      Reactions   Risperidone And Related Swelling, Other (See Comments)   Facial swelling   Buspirone Swelling   Crab [shellfish Allergy] Swelling, Other (See Comments)   FEVER   Other Hives, Itching, Other (See Comments)   crablegs only    Prednisone    She reported history of mouth swelling with prednisone   Gabapentin Nausea And Vomiting, Swelling   Speech impairment       Medication List    TAKE these medications   albuterol (2.5 MG/3ML) 0.083% nebulizer solution Commonly known as:  PROVENTIL Take 2.5 mg by nebulization every 6 (six) hours as needed for wheezing or shortness of breath.   albuterol 108 (90 Base) MCG/ACT inhaler Commonly known as:   PROVENTIL HFA;VENTOLIN HFA Inhale 2 puffs into the lungs every 6 (six) hours as needed for wheezing.   aspirin EC 325 MG tablet Take 1 tablet (325 mg total) by mouth daily.   atenolol-chlorthalidone 50-25 MG tablet Commonly known as:  TENORETIC Take 1 tablet by mouth daily.   benzonatate 100 MG capsule Commonly known as:  TESSALON Take 2 capsules (200 mg total) by mouth 2 (two) times daily as needed for cough.   BLUE-EMU MAXIMUM STRENGTH EX Apply 1 application topically 3 (three) times daily as needed (pain).   divalproex 500 MG DR tablet Commonly known as:  DEPAKOTE Take 1 tablet (500 mg total) by mouth every 12 (twelve) hours.   docusate sodium 100 MG capsule Commonly known as:  COLACE Take 1 capsule (100 mg total) by mouth 2 (two) times daily. What changed:  when to take this   Fluticasone-Salmeterol 250-50 MCG/DOSE Aepb Commonly known as:  ADVAIR Inhale 1 puff into the lungs 2 (two) times daily.   hydrOXYzine 25 MG tablet Commonly known as:  ATARAX/VISTARIL Take 1 tablet (25 mg total) by mouth every 6 (six) hours as needed for anxiety.   ibuprofen 200 MG tablet Commonly known as:  ADVIL,MOTRIN Take 600 mg by mouth every 4 (four) hours as needed for moderate pain.   ibuprofen 600 MG tablet Commonly known as:  ADVIL,MOTRIN Take 1 tablet (600 mg total) by mouth every 6 (six) hours as needed.   lidocaine 5 % Commonly known as:  LIDODERM Place 1 patch onto  the skin daily. Remove & Discard patch within 12 hours or as directed by MD   lisinopril 10 MG tablet Commonly known as:  PRINIVIL,ZESTRIL Take 1 tablet (10 mg total) by mouth daily.   lithium carbonate 450 MG CR tablet Commonly known as:  ESKALITH Take 450 mg by mouth at bedtime.   losartan 50 MG tablet Commonly known as:  COZAAR Take 50 mg by mouth daily.   meloxicam 15 MG tablet Commonly known as:  MOBIC Take 15 mg by mouth daily.   methocarbamol 500 MG tablet Commonly known as:  ROBAXIN Take 1  tablet (500 mg total) by mouth 2 (two) times daily.   methocarbamol 500 MG tablet Commonly known as:  ROBAXIN Take 1 tablet (500 mg total) by mouth every 8 (eight) hours as needed for muscle spasms.   montelukast 10 MG tablet Commonly known as:  SINGULAIR Take 10 mg by mouth daily.   multivitamin tablet Take 1 tablet by mouth daily.   MYRBETRIQ 25 MG Tb24 tablet Generic drug:  mirabegron ER Take 25 mg by mouth daily.   naproxen 500 MG tablet Commonly known as:  NAPROSYN Take 1 tablet (500 mg total) by mouth 2 (two) times daily as needed for mild pain, moderate pain or headache (TAKE WITH MEALS.).   omeprazole 20 MG capsule Commonly known as:  PRILOSEC Take 1 capsule (20 mg total) by mouth daily.   ondansetron 4 MG tablet Commonly known as:  ZOFRAN Take 1 tablet (4 mg total) by mouth every 8 (eight) hours as needed for nausea or vomiting.   oxyCODONE 5 MG immediate release tablet Commonly known as:  ROXICODONE Take 1 tablet (5 mg total) by mouth every 6 (six) hours as needed for severe pain.   oxymetazoline 0.05 % nasal spray Commonly known as:  AFRIN NASAL SPRAY Place 1 spray into both nostrils 2 (two) times daily. Spray once into each nostril twice daily for up to the next 3 days. Do not use for more than 3 days to prevent rebound rhinorrhea.   PARoxetine 30 MG tablet Commonly known as:  PAXIL Take 30 mg by mouth daily.   polyethylene glycol packet Commonly known as:  MIRALAX / GLYCOLAX Take 17 g by mouth daily.   QUEtiapine 400 MG tablet Commonly known as:  SEROQUEL Take 400 mg by mouth at bedtime.   QUEtiapine 300 MG tablet Commonly known as:  SEROQUEL Take 1 tablet (300 mg total) by mouth at bedtime.   sodium chloride 0.65 % Soln nasal spray Commonly known as:  OCEAN Place 1 spray into both nostrils 2 (two) times daily as needed for congestion.   tiZANidine 4 MG tablet Commonly known as:  ZANAFLEX Take 1 tablet (4 mg total) by mouth every 6 (six) hours  as needed for muscle spasms.   Vitamin D3 1000 units Caps Take 1,000 Units by mouth daily.            Discharge Care Instructions        Start     Ordered   06/19/17 0000  tiZANidine (ZANAFLEX) 4 MG tablet  Every 6 hours PRN     06/19/17 1015   06/19/17 0000  oxyCODONE (ROXICODONE) 5 MG immediate release tablet  Every 6 hours PRN     06/19/17 1015     Follow-up Information    Ashok Pall, MD Follow up in 3 week(s).   Specialty:  Neurosurgery Why:  please call the office to make an appointment Contact information: [address] [place] [address] [phone]           Signed: Winfield Cunas 06/19/2017, 10:26 AM

## 2017-06-19 NOTE — Evaluation (Signed)
Occupational Therapy Evaluation Patient Details Name: Alicia Blake MRN: [id number] DOB: 02/02/1966 Today's Date: 06/19/2017    History of Present Illness s/p lumbar decompression at L4/5 on the right.   Clinical Impression   Pt admitted with the above diagnoses and presents with below problem list. PTA pt was living by herself and mod I with ADLs. Pt is currently min guard with LB ADLs (now has LB AE). Pt dressed upon therapist arrival to room. She stated  she completed UB/LB dressing herself with great effort and time. Pt is for d/c today. ADL and home setup education given with mother present in room. No further acute OT needs indicated.     Follow Up Recommendations  DC plan and follow up therapy as arranged by surgeon    Equipment Recommendations  3 in 1 bedside commode    Recommendations for Other Services PT consult     Precautions / Restrictions Precautions Precautions: Back Precaution Booklet Issued: Yes (comment) Precaution Comments: reviewed BLT precautions Required Braces or Orthoses:  (order states no brace needed. pt requested one for comfort.)      Mobility Bed Mobility Overal bed mobility: Modified Independent             General bed mobility comments: pt practiced log roll.   Transfers Overall transfer level: Modified independent Equipment used: None             General transfer comment: from EOB 3x    Balance Overall balance assessment: Needs assistance         Standing balance support: During functional activity;No upper extremity supported Standing balance-Leahy Scale: Fair Standing balance comment: no AD used during session. Swaying but no LOB in-room.                           ADL either performed or assessed with clinical judgement   ADL Overall ADL's : Needs assistance/impaired Eating/Feeding: Set up;Sitting   Grooming: Min guard;Standing   Upper Body Bathing: Set up;Sitting   Lower Body Bathing: Min guard;Sit  to/from stand;With adaptive equipment;Cueing for back precautions   Upper Body Dressing : Set up;Sitting   Lower Body Dressing: Min guard;With adaptive equipment;Cueing for back precautions;Sit to/from stand   Toilet Transfer: Min guard;Ambulation   Toileting- Clothing Manipulation and Hygiene: Min guard;Sit to/from stand   Tub/ Shower Transfer: Min guard;3 in 1;Ambulation   Functional mobility during ADLs: Min guard;Rolling walker General ADL Comments: Bed mobility and in-room functional mobility with pt. Demonstrated and issued LB AE kit.      Vision         Perception     Praxis      Pertinent Vitals/Pain Pain Assessment: Faces Faces Pain Scale: No hurt     Hand Dominance     Extremity/Trunk Assessment Upper Extremity Assessment Upper Extremity Assessment: Overall WFL for tasks assessed   Lower Extremity Assessment Lower Extremity Assessment: Defer to PT evaluation       Communication Communication Communication: No difficulties   Cognition Arousal/Alertness: Awake/alert Behavior During Therapy: Anxious;WFL for tasks assessed/performed;Impulsive                                   General Comments: Tangental responses at time. Easily distracted. Impulsive. Requested back brace and once delivered and on pt (during session) pt starting to dance. Cautioned to maintain precautions.    General Comments         Exercises     Shoulder Instructions      Home Living Family/patient expects to be discharged to:: Private residence Living Arrangements: Alone Available Help at Discharge: Family;Other (Comment) (mother present during session) Type of Home: Apartment Home Access: Stairs to enter Entrance Stairs-Number of Steps: 10   Home Layout: One level     Bathroom Shower/Tub: Tub/shower unit         Home Equipment: Walker - 4 wheels   Additional Comments: Pt states her rollator will not fit in her apartment, uses furniture for external  support..      Prior Functioning/Environment Level of Independence: Independent with assistive device(s)                 OT Problem List: Impaired balance (sitting and/or standing);Decreased knowledge of use of DME or AE;Decreased knowledge of precautions;Pain      OT Treatment/Interventions:      OT Goals(Current goals can be found in the care plan section)    OT Frequency:     Barriers to D/C:            Co-evaluation              AM-PAC PT "6 Clicks" Daily Activity     Outcome Measure Help from another person eating meals?: None Help from another person taking care of personal grooming?: None Help from another person toileting, which includes using toliet, bedpan, or urinal?: A Little Help from another person bathing (including washing, rinsing, drying)?: A Little Help from another person to put on and taking off regular upper body clothing?: None Help from another person to put on and taking off regular lower body clothing?: A Little 6 Click Score: 21   End of Session Equipment Utilized During Treatment: Back brace (delivered and donned during session.)  Activity Tolerance: Patient tolerated treatment well Patient left: in bed;with call bell/phone within reach;with family/visitor present  OT Visit Diagnosis: Pain                Time: 1130-1200 OT Time Calculation (min): 30 min Charges:  OT General Charges $OT Visit: 1 Procedure OT Evaluation $OT Eval Low Complexity: 1 Procedure OT Treatments $Self Care/Home Management : 8-22 mins G-Codes: OT G-codes **NOT FOR INPATIENT CLASS** Functional Assessment Tool Used: AM-PAC 6 Clicks Daily Activity Functional Limitation: Self care Self Care Current Status (G8987): At least 20 percent but less than 40 percent impaired, limited or restricted Self Care Discharge Status (G8989): At least 20 percent but less than 40 percent impaired, limited or restricted     ,  H 06/19/2017, 12:43 PM 

## 2017-06-19 NOTE — Anesthesia Postprocedure Evaluation (Signed)
Anesthesia Post Note  Patient: Alicia Blake  Procedure(s) Performed: Procedure(s) (LRB): LAMINECTOMY AND FORAMINOTOMY LUMBAR FOUR - LUMBAR FIVE (N/A)     Patient location during evaluation: PACU Anesthesia Type: General Level of consciousness: sedated and patient cooperative Pain management: pain level controlled Vital Signs Assessment: post-procedure vital signs reviewed and stable Respiratory status: spontaneous breathing Cardiovascular status: stable Anesthetic complications: no    Last Vitals:  Vitals:   06/19/17 0810 06/19/17 1025  BP: 99/61 125/62  Pulse: 66 74  Resp: 18 18  Temp: 37 C 36.4 C  SpO2: 95% 97%    Last Pain:  Vitals:   06/19/17 1034  TempSrc:   PainSc: [place]

## 2017-06-19 NOTE — Progress Notes (Signed)
Patient alert and oriented, mae's well, voiding adequate amount of urine, swallowing without difficulty, no c/o pain at time of discharge. Patient discharged home with family. Script and discharged instructions given to patient. Patient and family stated understanding of instructions given. Patient has an appointment with Dr. Cabbell   

## 2017-06-26 ENCOUNTER — Emergency Department (HOSPITAL_COMMUNITY): Payer: Medicaid Other

## 2017-06-26 ENCOUNTER — Encounter (HOSPITAL_COMMUNITY): Payer: Self-pay | Admitting: Emergency Medicine

## 2017-06-26 ENCOUNTER — Emergency Department (HOSPITAL_COMMUNITY)
Admission: EM | Admit: 2017-06-26 | Discharge: 2017-06-26 | Disposition: A | Discharge status: Home / Self Care | Payer: Medicaid Other | Attending: Physician Assistant | Admitting: Physician Assistant

## 2017-06-26 DIAGNOSIS — Z79899 Other long term (current) drug therapy: Secondary | ICD-10-CM | POA: Insufficient documentation

## 2017-06-26 DIAGNOSIS — J449 Chronic obstructive pulmonary disease, unspecified: Secondary | ICD-10-CM | POA: Insufficient documentation

## 2017-06-26 DIAGNOSIS — Z96642 Presence of left artificial hip joint: Secondary | ICD-10-CM | POA: Diagnosis not present

## 2017-06-26 DIAGNOSIS — I10 Essential (primary) hypertension: Secondary | ICD-10-CM | POA: Diagnosis not present

## 2017-06-26 DIAGNOSIS — E119 Type 2 diabetes mellitus without complications: Secondary | ICD-10-CM | POA: Diagnosis not present

## 2017-06-26 DIAGNOSIS — J181 Lobar pneumonia, unspecified organism: Secondary | ICD-10-CM | POA: Diagnosis not present

## 2017-06-26 DIAGNOSIS — F17210 Nicotine dependence, cigarettes, uncomplicated: Secondary | ICD-10-CM | POA: Insufficient documentation

## 2017-06-26 DIAGNOSIS — J690 Pneumonitis due to inhalation of food and vomit: Secondary | ICD-10-CM

## 2017-06-26 DIAGNOSIS — R4182 Altered mental status, unspecified: Secondary | ICD-10-CM | POA: Diagnosis present

## 2017-06-26 LAB — COMPREHENSIVE METABOLIC PANEL
ALK PHOS: 83 U/L (ref 38–126)
ALT: 16 U/L (ref 14–54)
AST: 19 U/L (ref 15–41)
Albumin: 3.1 g/dL — ABNORMAL LOW (ref 3.5–5.0)
Anion gap: 11 (ref 5–15)
BILIRUBIN TOTAL: 0.8 mg/dL (ref 0.3–1.2)
BUN: 15 mg/dL (ref 6–20)
CALCIUM: 10.1 mg/dL (ref 8.9–10.3)
CO2: 27 mmol/L (ref 22–32)
CREATININE: 1.08 mg/dL — AB (ref 0.44–1.00)
Chloride: 98 mmol/L — ABNORMAL LOW (ref 101–111)
GFR, EST NON AFRICAN AMERICAN: 58 mL/min — AB (ref >60)
Glucose, Bld: 101 mg/dL — ABNORMAL HIGH (ref 65–99)
Potassium: 3.7 mmol/L (ref 3.5–5.1)
SODIUM: 136 mmol/L (ref 135–145)
TOTAL PROTEIN: 8.8 g/dL — AB (ref 6.5–8.1)

## 2017-06-26 LAB — CBC
HCT: 30.3 % — ABNORMAL LOW (ref 36.0–46.0)
Hemoglobin: 9.6 g/dL — ABNORMAL LOW (ref 12.0–15.0)
MCH: 29.1 pg (ref 26.0–34.0)
MCHC: 31.7 g/dL (ref 30.0–36.0)
MCV: 91.8 fL (ref 78.0–100.0)
PLATELETS: 316 10*3/uL (ref 150–400)
RBC: 3.3 MIL/uL — AB (ref 3.87–5.11)
RDW: 15.9 % — ABNORMAL HIGH (ref 11.5–15.5)
WBC: 16.4 10*3/uL — ABNORMAL HIGH (ref 4.0–10.5)

## 2017-06-26 MED ORDER — AMOXICILLIN-POT CLAVULANATE 875-125 MG PO TABS: 1.0000 | ORAL_TABLET | Freq: Two times a day (BID) | ORAL | 0 refills | Status: DC

## 2017-06-26 MED ORDER — AMOXICILLIN-POT CLAVULANATE 875-125 MG PO TABS
1.0000 | ORAL_TABLET | Freq: Once | ORAL | Status: AC
  Administered 2017-06-26: 1 via ORAL
  Filled 2017-06-26: qty 1

## 2017-06-26 NOTE — ED Notes (Signed)
PT ambulating in room with walker without any problems

## 2017-06-26 NOTE — ED Provider Notes (Signed)
[facility] DEPT Provider Note   CSN: [id number] Arrival date & time: 06/26/17  1246     History   Chief Complaint Chief Complaint  Patient presents with  . Altered Mental Status  . Post-op Problem    HPI Alicia Blake is a 51 y.o. female.  HPI   Patient is a 51 year old female presenting with altered mental status.patient had recent surgery by Dr. Cyndy Freeze. She was in the hospital for less than 24 hours. Since then patie been taking the Percocet he prescribed. Patient's mother was not aware that she had been prescribed Percocet. Patient has had a trouble with addiction in the past wtih crack cocaine and alcohol. She's been clean for 3 years. Mom reports that she was altered, which she thinks is from the pain medication. However she started and stopped the medication 2 days ago and patient contniues to  to be tired, not eating. Patient has mild cough.  Past Medical History:  Diagnosis Date  . Asthma   . Bipolar affect, depressed ([place])   . Bronchitis   . Chronic back pain   . COPD (chronic obstructive pulmonary disease) ([place])   . Diabetes mellitus without complication ([place])    DIET CONTROLLED entered by office  . DJD (degenerative joint disease)    BACK  . GERD (gastroesophageal reflux disease)   . Hypertension   . Pre-diabetes    dr entered diabetes without complications- no meds does not test sugar at home  . PTSD (post-traumatic stress disorder)   . Scoliosis   . Seizures ([place])    "when drinking" last yrs ago  . Shortness of breath dyspnea    exersion  . Thyroid disease   . UTI (lower urinary tract infection)     Patient Active Problem List   Diagnosis Date Noted  . Lumbar stenosis with neurogenic claudication 06/18/2017  . Tobacco abuse 04/19/2016  . Primary osteoarthritis of left hip 04/18/2016  . Opioid use disorder, mild, abuse 09/08/2015  . Hypokalemia 09/07/2015  . Schizoaffective disorder, bipolar type ([place]) 09/07/2015  . Chronic pain syndrome  09/07/2015  . Alcohol use disorder, moderate, in sustained remission ([place]) 09/07/2015  . Cocaine use disorder, moderate, in sustained remission ([place]) 09/07/2015  . Cannabis use disorder, moderate, in sustained remission ([place]) 09/07/2015  . DDD (degenerative disc disease), lumbar 09/07/2015  . Hx of fracture of ankle 09/07/2015  . Symptomatic Fibroids 08/09/2013  . Breast lump on left side at 9 o'clock position 06/29/2013  . Breast discharge 06/29/2013    Past Surgical History:  Procedure Laterality Date  . 2 LEFT TOES SURGERY  12/2014   finished only on right side  . BALLOON DILATION N/A 12/14/2015   Procedure: BALLOON DILATION;  Surgeon: Teena Irani, MD;  Location: [facility];  Service: Endoscopy;  Laterality: N/A;  . COLONOSCOPY WITH PROPOFOL N/A 12/14/2015   Procedure: COLONOSCOPY WITH PROPOFOL;  Surgeon: Teena Irani, MD;  Location: [facility];  Service: Endoscopy;  Laterality: N/A;  . ESOPHAGOGASTRODUODENOSCOPY (EGD) WITH PROPOFOL N/A 12/14/2015   Procedure: ESOPHAGOGASTRODUODENOSCOPY (EGD) WITH PROPOFOL;  Surgeon: Teena Irani, MD;  Location: [facility];  Service: Endoscopy;  Laterality: N/A;  . JOINT REPLACEMENT     left hip  . LEFT ANKLE SURGERY  MARCH 2016  . LUMBAR LAMINECTOMY/DECOMPRESSION MICRODISCECTOMY N/A 06/18/2017   Procedure: LAMINECTOMY AND FORAMINOTOMY LUMBAR FOUR - LUMBAR FIVE;  Surgeon: Ashok Pall, MD;  Location: [facility];  Service: Neurosurgery;  Laterality: N/A;  LAMINECTOMY AND FORAMINOTOMY LUMBAR 4- LUMBAR 5  . NECK  SURGERY  AS TEENAGER   STITCHES TO NECK   . SURGERY FOR CUT ON STOMACH  TEENAGER  . TOTAL HIP ARTHROPLASTY Left 05/14/2016   Procedure: TOTAL HIP ARTHROPLASTY ANTERIOR APPROACH;  Surgeon: Renette Butters, MD;  Location: [facility];  Service: Orthopedics;  Laterality: Left;    OB History    Gravida Para Term Preterm AB Living   1 1 1     1    SAB TAB Ectopic Multiple Live Births                   Home Medications    Prior to Admission  medications   Medication Sig Start Date End Date Taking? Authorizing Provider  albuterol (PROVENTIL HFA;VENTOLIN HFA) 108 (90 BASE) MCG/ACT inhaler Inhale 2 puffs into the lungs every 6 (six) hours as needed for wheezing.    [provider]  albuterol (PROVENTIL) (2.5 MG/3ML) 0.083% nebulizer solution Take 2.5 mg by nebulization every 6 (six) hours as needed for wheezing or shortness of breath.    [provider]  aspirin EC 325 MG tablet Take 1 tablet (325 mg total) by mouth daily. Patient not taking: Reported on 03/18/2017 05/14/16   Renette Butters, MD  atenolol-chlorthalidone (TENORETIC) 50-25 MG tablet Take 1 tablet by mouth daily.    [provider]  benzonatate (TESSALON) 100 MG capsule Take 2 capsules (200 mg total) by mouth 2 (two) times daily as needed for cough. Patient not taking: Reported on 03/18/2017 01/02/17   Nona Dell, PA-C  Cholecalciferol (VITAMIN D3) 1000 units CAPS Take 1,000 Units by mouth daily.    [provider]  divalproex (DEPAKOTE) 500 MG DR tablet Take 1 tablet (500 mg total) by mouth every 12 (twelve) hours. Patient not taking: Reported on 03/18/2017 09/12/15   Benjamine Mola, FNP  docusate sodium (COLACE) 100 MG capsule Take 1 capsule (100 mg total) by mouth 2 (two) times daily. Patient taking differently: Take 100 mg by mouth daily.  05/14/16   Renette Butters, MD  Fluticasone-Salmeterol (ADVAIR) 250-50 MCG/DOSE AEPB Inhale 1 puff into the lungs 2 (two) times daily.    [provider]  hydrOXYzine (ATARAX/VISTARIL) 25 MG tablet Take 1 tablet (25 mg total) by mouth every 6 (six) hours as needed for anxiety. Patient not taking: Reported on 03/18/2017 09/12/15   Benjamine Mola, FNP  ibuprofen (ADVIL,MOTRIN) 200 MG tablet Take 600 mg by mouth every 4 (four) hours as needed for moderate pain.    [provider]  ibuprofen (ADVIL,MOTRIN) 600 MG tablet Take 1 tablet (600 mg total) by mouth every 6 (six)  hours as needed. Patient not taking: Reported on 06/12/2017 01/02/17   Nona Dell, PA-C  lidocaine (LIDODERM) 5 % Place 1 patch onto the skin daily. Remove & Discard patch within 12 hours or as directed by MD Patient not taking: Reported on 04/23/2017 04/19/17   Street, [place], PA-C  lisinopril (PRINIVIL,ZESTRIL) 10 MG tablet Take 1 tablet (10 mg total) by mouth daily. Patient not taking: Reported on 11/12/2016 09/12/15   Benjamine Mola, FNP  lithium carbonate (ESKALITH) 450 MG CR tablet Take 450 mg by mouth at bedtime.     [provider]  losartan (COZAAR) 50 MG tablet Take 50 mg by mouth daily.    [provider]  meloxicam (MOBIC) 15 MG tablet Take 15 mg by mouth daily.    [provider]  Menthol, Topical Analgesic, (BLUE-EMU MAXIMUM STRENGTH EX) Apply 1 application  topically 3 (three) times daily as needed (pain).    [provider]  methocarbamol (ROBAXIN) 500 MG tablet Take 1 tablet (500 mg total) by mouth 2 (two) times daily. Patient not taking: Reported on 03/18/2017 01/02/17   Nona Dell, PA-C  methocarbamol (ROBAXIN) 500 MG tablet Take 1 tablet (500 mg total) by mouth every 8 (eight) hours as needed for muscle spasms. Patient not taking: Reported on 04/23/2017 04/19/17   Street, [place], PA-C  mirabegron ER (MYRBETRIQ) 25 MG TB24 tablet Take 25 mg by mouth daily.    [provider]  montelukast (SINGULAIR) 10 MG tablet Take 10 mg by mouth daily.     [provider]  Multiple Vitamin (MULTIVITAMIN) tablet Take 1 tablet by mouth daily.    [provider]  naproxen (NAPROSYN) 500 MG tablet Take 1 tablet (500 mg total) by mouth 2 (two) times daily as needed for mild pain, moderate pain or headache (TAKE WITH MEALS.). Patient not taking: Reported on 06/12/2017 04/19/17   Street, [place], PA-C  omeprazole (PRILOSEC) 20 MG capsule Take 1 capsule (20 mg total) by mouth daily. 05/14/16   Renette Butters, MD    ondansetron (ZOFRAN) 4 MG tablet Take 1 tablet (4 mg total) by mouth every 8 (eight) hours as needed for nausea or vomiting. Patient not taking: Reported on 11/12/2016 05/14/16   Renette Butters, MD  oxyCODONE (ROXICODONE) 5 MG immediate release tablet Take 1 tablet (5 mg total) by mouth every 6 (six) hours as needed for severe pain. 06/19/17   Ashok Pall, MD  oxymetazoline (AFRIN NASAL SPRAY) 0.05 % nasal spray Place 1 spray into both nostrils 2 (two) times daily. Spray once into each nostril twice daily for up to the next 3 days. Do not use for more than 3 days to prevent rebound rhinorrhea. Patient not taking: Reported on 04/23/2017 01/02/17   Nona Dell, PA-C  PARoxetine (PAXIL) 30 MG tablet Take 30 mg by mouth daily.    [provider]  polyethylene glycol (MIRALAX / GLYCOLAX) packet Take 17 g by mouth daily. Patient not taking: Reported on 04/23/2017 04/19/17   Street, [place], PA-C  QUEtiapine (SEROQUEL) 300 MG tablet Take 1 tablet (300 mg total) by mouth at bedtime. Patient not taking: Reported on 06/12/2017 09/12/15   Benjamine Mola, FNP  QUEtiapine (SEROQUEL) 400 MG tablet Take 400 mg by mouth at bedtime.    [provider]  sodium chloride (OCEAN) 0.65 % SOLN nasal spray Place 1 spray into both nostrils 2 (two) times daily as needed for congestion.    [provider]  tiZANidine (ZANAFLEX) 4 MG tablet Take 1 tablet (4 mg total) by mouth every 6 (six) hours as needed for muscle spasms. 06/19/17   Ashok Pall, MD    Family History Family History  Problem Relation Age of Onset  . Hypertension Mother   . Hypertension Maternal Grandmother   . Hypertension Paternal Grandmother   . Diabetes Paternal Grandmother   . Hypertension Paternal Grandfather   . Diabetes Paternal Grandfather   . Alcohol abuse Paternal Aunt   . Mental illness Cousin   . Heart attack Cousin   . Heart attack Maternal Uncle     Social History Social History  Substance  Use Topics  . Smoking status: Current Every Day Smoker    Packs/day: 0.25    Years: 26.00    Types: Cigarettes  . Smokeless tobacco: Never Used  . Alcohol use No  Comment: quit date 01/17/2013     Allergies   Risperidone and related; Buspirone; Crab [shellfish allergy]; Other; Prednisone; and Gabapentin   Review of Systems Review of Systems  Constitutional: Positive for appetite change and fatigue. Negative for activity change and fever.  Respiratory: Positive for cough. Negative for shortness of breath.   Cardiovascular: Negative for chest pain.  Gastrointestinal: Negative for abdominal pain.     Physical Exam Updated Vital Signs BP 121/62 (BP Location: Right Arm)   Pulse 73   Temp 98.9 F (37.2 C) (Oral)   Resp 20   Ht 5\' 7"  (1.702 m)   Wt 117.5 kg (259 lb)   SpO2 97%   BMI 40.57 kg/m   Physical Exam  Constitutional: She is oriented to person, place, and time. She appears well-developed and well-nourished.  HENT:  Head: Normocephalic and atraumatic.  Eyes: Right eye exhibits no discharge.  Cardiovascular: Normal rate, regular rhythm and normal heart sounds.   No murmur heard. Pulmonary/Chest: Effort normal. She has no wheezes. She has no rales.  R base rhonchi  Abdominal: Soft. She exhibits no distension. There is no tenderness.  Neurological: She is oriented to person, place, and time.  Skin: Skin is warm and dry. She is not diaphoretic.  Psychiatric: She has a normal mood and affect.  Nursing note and vitals reviewed.    ED Treatments / Results  Labs (all labs ordered are listed, but only abnormal results are displayed) Labs Reviewed  COMPREHENSIVE METABOLIC PANEL - Abnormal; Notable for the following:       Result Value   Chloride 98 (*)    Glucose, Bld 101 (*)    Creatinine, Ser 1.08 (*)    Total Protein 8.8 (*)    Albumin 3.1 (*)    GFR calc non Af Amer 58 (*)    All other components within normal limits  CBC - Abnormal; Notable for the  following:    WBC 16.4 (*)    RBC 3.30 (*)    Hemoglobin 9.6 (*)    HCT 30.3 (*)    RDW 15.9 (*)    All other components within normal limits  URINALYSIS, ROUTINE W REFLEX MICROSCOPIC    EKG  EKG Interpretation None       Radiology Dg Chest 2 View  Result Date: 06/26/2017 CLINICAL DATA:  Mental status change since back surgery 8 days ago. EXAM: CHEST  2 VIEW COMPARISON:  Chest x-ray of January 02, 2017 FINDINGS: The lungs are adequately inflated. There are subtle increased densities at the right lung base likely in the lower lobe. The left lung is clear. The heart and pulmonary vascularity are normal. The bony thorax is unremarkable. IMPRESSION: Patchy density in the right lower lobe worrisome for atelectasis or pneumonia. No definite CHF. Electronically Signed   By: David  Martinique M.D.   On: 06/26/2017 14:49   Ct Head Wo Contrast  Result Date: 06/26/2017 CLINICAL DATA:  Increased confusion and altered mental status. EXAM: CT HEAD WITHOUT CONTRAST TECHNIQUE: Contiguous axial images were obtained from the base of the skull through the vertex without intravenous contrast. COMPARISON:  None. FINDINGS: Brain: No evidence of acute infarction, hemorrhage, hydrocephalus, extra-axial collection or mass lesion/mass effect. Vascular: No hyperdense vessel or unexpected calcification. Skull: Normal. Negative for fracture or focal lesion. Sinuses/Orbits: No acute finding. Other: None. IMPRESSION: No acute intracranial abnormality. Electronically Signed   By: Fidela Salisbury M.D.   On: 06/26/2017 15:48    Procedures Procedures (including critical care  time)  Medications Ordered in ED Medications  amoxicillin-clavulanate (AUGMENTIN) 875-125 MG per tablet 1 tablet (not administered)     Initial Impression / Assessment and Plan / ED Course  I have reviewed the triage vital signs and the nursing notes.  Pertinent labs & imaging results that were available during my care of the patient were  reviewed by me and considered in my medical decision making (see chart for details).     Patient is a 51 year old female presenting with altered mental status.patient had recent surgery by Dr. Cyndy Freeze. She was in the hospital for less than 24 hours. Since then patie been taking the Percocet he prescribed. Patient's mother was not aware that she had been prescribed Percocet. Patient has had a trouble with addiction in the past wtih crack cocaine and alcohol. She's been clean for 3 years. Mom reports that she was altered, which she thinks is from the pain medication. However she started and stopped the medication 2 days ago and patient contniues to  to be tired, not eating. Patient has mild cough.  7:07 PM Chest x-ray shows right lower lobe pneumnia. This is consitent with her exam and her symptoms. Augmentin given the fact that this is likely aspiration pneumonia in the setting of recent surgery, no recent hospital staym meeting HCAP requirements.  Patient is able to eat, has normal vital signs. We will try outpatient therapy. Strict return cautions expressed.  Final Clinical Impressions(s) / ED Diagnoses   Final diagnoses:  None    New Prescriptions New Prescriptions   No medications on file     Macarthur Critchley, MD 06/26/17 [phone]

## 2017-06-26 NOTE — ED Triage Notes (Signed)
Patient has back surgery 8/22 and discharged 8/23. Mother brought patient to the ED today for evaluation for continued back pain, falling with walker, mother states patient taking oxycodone 5 mg and since then altered mental status opening car doors taking off seatbelt confused. Took pain medication away on Monday and continued to have these symptoms.  Patient alert answering and following commands with mother in triage.

## 2017-06-26 NOTE — Discharge Instructions (Signed)
Please return with any other concerns.

## 2017-07-20 ENCOUNTER — Encounter (HOSPITAL_COMMUNITY): Payer: Self-pay | Admitting: *Deleted

## 2017-07-20 ENCOUNTER — Ambulatory Visit (HOSPITAL_COMMUNITY)
Admission: EM | Admit: 2017-07-20 | Discharge: 2017-07-20 | Disposition: A | Discharge status: Home / Self Care | Payer: Medicaid Other | Attending: Internal Medicine | Admitting: Internal Medicine

## 2017-07-20 DIAGNOSIS — K6289 Other specified diseases of anus and rectum: Secondary | ICD-10-CM | POA: Diagnosis not present

## 2017-07-20 HISTORY — DX: Pneumonia, unspecified organism: J18.9

## 2017-07-20 MED ORDER — POLYETHYLENE GLYCOL 3350 17 GM/SCOOP PO POWD: 17.0000 g | Freq: Every day | ORAL | 0 refills | Status: DC

## 2017-07-20 MED ORDER — LIDOCAINE 5 % EX OINT: 1.0000 "application " | TOPICAL_OINTMENT | Freq: Four times a day (QID) | CUTANEOUS | 0 refills | Status: DC | PRN

## 2017-07-20 NOTE — ED Triage Notes (Signed)
Reports having some contipation and hard stools recently along with rectal pain.  Saw PCP last week - was told to start Sennokot.  Pt now having easier stools, but noticed hemorrhoid.  Started using hemorrhoid cream, but c/o significant pain.

## 2017-07-20 NOTE — Discharge Instructions (Addendum)
Anticipate gradual improvement in pain in the bottom over the next few days. A couple small, resolving hemorrhoids were seen on exam, nothing requiring immediate intervention. The skin on your bottom is inflamed/sore, probably from multiple bowel movements last evening and this morning. Wash bottom gently with warm water after bowel movement, pat dry.  Apply lidocaine ointment up to 4 times daily as needed for pain.  Advil or Aleve over-the-counter will also help with pain.  Use MiraLAX daily and a glass of juice or other fluid to keep bowel movements soft.

## 2017-07-20 NOTE — ED Provider Notes (Signed)
[facility]    CSN: [id number] Arrival date & time: 07/20/17  1248     History   Chief Complaint Chief Complaint  Patient presents with  . Rectal Pain    HPI Alicia Blake is a 51 y.o. female. She had back surgery a few weeks ago, and postop developed some constipation related to pain medicine. She saw her primary care provider recently, who suggested that she try Senokot She also recommended MiraLAX.  Last evening and this morning, the patient had multiple, multiple bowel movements, and her bottom is quite sore now. She is worried about a hemorrhoid.    HPI  Past Medical History:  Diagnosis Date  . Asthma   . Bipolar affect, depressed ([place])   . Bronchitis   . Chronic back pain   . COPD (chronic obstructive pulmonary disease) ([place])   . Diabetes mellitus without complication ([place])    DIET CONTROLLED entered by office  . DJD (degenerative joint disease)    BACK  . GERD (gastroesophageal reflux disease)   . Hypertension   . Pneumonia   . Pre-diabetes    dr entered diabetes without complications- no meds does not test sugar at home  . PTSD (post-traumatic stress disorder)   . Scoliosis   . Seizures ([place])    "when drinking" last yrs ago  . Shortness of breath dyspnea    exersion  . Thyroid disease   . UTI (lower urinary tract infection)     Patient Active Problem List   Diagnosis Date Noted  . Lumbar stenosis with neurogenic claudication 06/18/2017  . Tobacco abuse 04/19/2016  . Primary osteoarthritis of left hip 04/18/2016  . Opioid use disorder, mild, abuse 09/08/2015  . Hypokalemia 09/07/2015  . Schizoaffective disorder, bipolar type ([place]) 09/07/2015  . Chronic pain syndrome 09/07/2015  . Alcohol use disorder, moderate, in sustained remission ([place]) 09/07/2015  . Cocaine use disorder, moderate, in sustained remission ([place]) 09/07/2015  . Cannabis use disorder, moderate, in sustained remission ([place]) 09/07/2015  . DDD (degenerative disc disease),  lumbar 09/07/2015  . Hx of fracture of ankle 09/07/2015  . Symptomatic Fibroids 08/09/2013  . Breast lump on left side at 9 o'clock position 06/29/2013  . Breast discharge 06/29/2013    Past Surgical History:  Procedure Laterality Date  . 2 LEFT TOES SURGERY  12/2014   finished only on right side  . BACK SURGERY    . BALLOON DILATION N/A 12/14/2015   Procedure: BALLOON DILATION;  Surgeon: Teena Irani, MD;  Location: [facility];  Service: Endoscopy;  Laterality: N/A;  . COLONOSCOPY WITH PROPOFOL N/A 12/14/2015   Procedure: COLONOSCOPY WITH PROPOFOL;  Surgeon: Teena Irani, MD;  Location: [facility];  Service: Endoscopy;  Laterality: N/A;  . ESOPHAGOGASTRODUODENOSCOPY (EGD) WITH PROPOFOL N/A 12/14/2015   Procedure: ESOPHAGOGASTRODUODENOSCOPY (EGD) WITH PROPOFOL;  Surgeon: Teena Irani, MD;  Location: [facility];  Service: Endoscopy;  Laterality: N/A;  . JOINT REPLACEMENT     left hip  . LEFT ANKLE SURGERY  MARCH 2016  . LUMBAR LAMINECTOMY/DECOMPRESSION MICRODISCECTOMY N/A 06/18/2017   Procedure: LAMINECTOMY AND FORAMINOTOMY LUMBAR FOUR - LUMBAR FIVE;  Surgeon: Ashok Pall, MD;  Location: [facility];  Service: Neurosurgery;  Laterality: N/A;  LAMINECTOMY AND FORAMINOTOMY LUMBAR 4- LUMBAR 5  . NECK SURGERY  AS TEENAGER   STITCHES TO NECK   . SURGERY FOR CUT ON STOMACH  TEENAGER  . TOTAL HIP ARTHROPLASTY Left 05/14/2016   Procedure: TOTAL HIP ARTHROPLASTY ANTERIOR APPROACH;  Surgeon: Renette Butters, MD;  Location: [facility];  Service: Orthopedics;  Laterality: Left;    OB History    Gravida Para Term Preterm AB Living   1 1 1     1    SAB TAB Ectopic Multiple Live Births                   Home Medications    Prior to Admission medications   Medication Sig Start Date End Date Taking? Authorizing Provider  ACETAMINOPHEN PO Take by mouth.   Yes [provider]  albuterol (PROVENTIL HFA;VENTOLIN HFA) 108 (90 BASE) MCG/ACT inhaler Inhale 2 puffs into the lungs every 6 (six) hours as  needed for wheezing.   Yes [provider]  albuterol (PROVENTIL) (2.5 MG/3ML) 0.083% nebulizer solution Take 2.5 mg by nebulization every 6 (six) hours as needed for wheezing or shortness of breath.   Yes [provider]  atenolol-chlorthalidone (TENORETIC) 50-25 MG tablet Take 1 tablet by mouth daily.   Yes [provider]  Cholecalciferol (VITAMIN D3) 1000 units CAPS Take 1,000 Units by mouth daily.   Yes [provider]  docusate sodium (COLACE) 100 MG capsule Take 1 capsule (100 mg total) by mouth 2 (two) times daily. Patient taking differently: Take 100 mg by mouth daily.  05/14/16  Yes Renette Butters, MD  Fluticasone-Salmeterol (ADVAIR) 250-50 MCG/DOSE AEPB Inhale 1 puff into the lungs 2 (two) times daily.   Yes [provider]  lithium carbonate (ESKALITH) 450 MG CR tablet Take 450 mg by mouth at bedtime.    Yes [provider]  LITHIUM PO Take by mouth.   Yes [provider]  losartan-hydrochlorothiazide (HYZAAR) 100-25 MG tablet Take 1 tablet by mouth daily.   Yes [provider]  montelukast (SINGULAIR) 10 MG tablet Take 10 mg by mouth daily.    Yes [provider]  omeprazole (PRILOSEC) 20 MG capsule Take 1 capsule (20 mg total) by mouth daily. 05/14/16  Yes Renette Butters, MD  oxyCODONE (ROXICODONE) 5 MG immediate release tablet Take 1 tablet (5 mg total) by mouth every 6 (six) hours as needed for severe pain. 06/19/17  Yes Ashok Pall, MD  PARoxetine (PAXIL) 30 MG tablet Take 30 mg by mouth daily.   Yes [provider]  QUEtiapine (SEROQUEL) 400 MG tablet Take 400 mg by mouth at bedtime.   Yes [provider]  tiZANidine (ZANAFLEX) 4 MG tablet Take 1 tablet (4 mg total) by mouth every 6 (six) hours as needed for muscle spasms. 06/19/17  Yes Ashok Pall, MD  amoxicillin-clavulanate (AUGMENTIN) 875-125 MG tablet Take 1 tablet by mouth every 12 (twelve) hours. 06/26/17   Mackuen,  Courteney Lyn, MD  ibuprofen (ADVIL,MOTRIN) 200 MG tablet Take 600 mg by mouth every 4 (four) hours as needed for moderate pain.    [provider]  lidocaine (XYLOCAINE) 5 % ointment Apply 1 application topically 4 (four) times daily as needed. 07/20/17   Sherlene Shams, MD  losartan (COZAAR) 50 MG tablet Take 50 mg by mouth daily.    [provider]  meloxicam (MOBIC) 15 MG tablet Take 15 mg by mouth daily.    [provider]  Menthol, Topical Analgesic, (BLUE-EMU MAXIMUM STRENGTH EX) Apply 1 application topically 3 (three) times daily as needed (pain).    [provider]  mirabegron ER (MYRBETRIQ) 25 MG TB24 tablet Take 25 mg by mouth daily.    [provider]  Multiple Vitamin (MULTIVITAMIN) tablet Take 1 tablet by  mouth daily.    [provider]  polyethylene glycol powder (GLYCOLAX/MIRALAX) powder Take 17 g by mouth daily. As needed to keep bowel movements soft 07/20/17   Sherlene Shams, MD  sodium chloride (OCEAN) 0.65 % SOLN nasal spray Place 1 spray into both nostrils 2 (two) times daily as needed for congestion.    [provider]    Family History Family History  Problem Relation Age of Onset  . Hypertension Mother   . Hypertension Maternal Grandmother   . Hypertension Paternal Grandmother   . Diabetes Paternal Grandmother   . Hypertension Paternal Grandfather   . Diabetes Paternal Grandfather   . Alcohol abuse Paternal Aunt   . Mental illness Cousin   . Heart attack Cousin   . Heart attack Maternal Uncle     Social History Social History  Substance Use Topics  . Smoking status: Current Every Day Smoker    Packs/day: 0.25    Years: 26.00    Types: Cigarettes  . Smokeless tobacco: Never Used  . Alcohol use No     Comment: quit date 01/17/2013     Allergies   Risperidone and related; Buspirone; Crab [shellfish allergy]; Other; Prednisone; and Gabapentin   Review of Systems Review of Systems  All  other systems reviewed and are negative.    Physical Exam Triage Vital Signs ED Triage Vitals  Enc Vitals Group     BP 07/20/17 1355 (!) 103/58     Pulse Rate 07/20/17 1355 63     Resp 07/20/17 1355 18     Temp 07/20/17 1355 98 F (36.7 C)     Temp Source 07/20/17 1355 Oral     SpO2 07/20/17 1355 100 %     Weight --      Height --      Pain Score 07/20/17 1356 10     Pain Loc --    Updated Vital Signs BP (!) 103/58   Pulse 63   Temp 98 F (36.7 C) (Oral)   Resp 18   SpO2 100%  Physical Exam  Constitutional: She is oriented to person, place, and time. No distress.  HENT:  Head: Atraumatic.  Eyes:  Conjugate gaze observed, no eye redness/discharge  Neck: Neck supple.  Cardiovascular: Normal rate.   Pulmonary/Chest: No respiratory distress.  Abdominal: She exhibits no distension.  Genitourinary:  Genitourinary Comments: 2 small, noninflamed external piles, not tense, nontender to palpation. Anal skin is slightly inflamed/excoriated.  No fissure appreciated.  Musculoskeletal: Normal range of motion.  Neurological: She is alert and oriented to person, place, and time.  Skin: Skin is warm and dry.  Nursing note and vitals reviewed.    UC Treatments / Results   Procedures Procedures (including critical care time) None today  Final Clinical Impressions(s) / UC Diagnoses   Final diagnoses:  Anal pain   Anticipate gradual improvement in pain in the bottom over the next few days. A couple small, resolving hemorrhoids were seen on exam, nothing requiring immediate intervention. The skin on your bottom is inflamed/sore, probably from multiple bowel movements last evening and this morning. Wash bottom gently with warm water after bowel movement, pat dry.  Apply lidocaine ointment up to 4 times daily as needed for pain.  Advil or Aleve over-the-counter will also help with pain.  Use MiraLAX daily and a glass of juice or other fluid to keep bowel movements soft.   New  Prescriptions New Prescriptions   LIDOCAINE (XYLOCAINE) 5 % OINTMENT  Apply 1 application topically 4 (four) times daily as needed.   POLYETHYLENE GLYCOL POWDER (GLYCOLAX/MIRALAX) POWDER    Take 17 g by mouth daily. As needed to keep bowel movements soft     Controlled Substance Prescriptions [place] Controlled Substance Registry consulted? No   Sherlene Shams, MD 07/21/17 1425

## 2017-07-24 ENCOUNTER — Ambulatory Visit (INDEPENDENT_AMBULATORY_CARE_PROVIDER_SITE_OTHER): Payer: Self-pay | Admitting: Otolaryngology

## 2017-08-31 ENCOUNTER — Emergency Department (HOSPITAL_COMMUNITY)
Admission: EM | Admit: 2017-08-31 | Discharge: 2017-08-31 | Disposition: A | Discharge status: Home / Self Care | Payer: Medicaid Other | Attending: Emergency Medicine | Admitting: Emergency Medicine

## 2017-08-31 ENCOUNTER — Encounter (HOSPITAL_COMMUNITY): Payer: Self-pay | Admitting: Emergency Medicine

## 2017-08-31 DIAGNOSIS — J45909 Unspecified asthma, uncomplicated: Secondary | ICD-10-CM | POA: Insufficient documentation

## 2017-08-31 DIAGNOSIS — B9789 Other viral agents as the cause of diseases classified elsewhere: Secondary | ICD-10-CM | POA: Diagnosis present

## 2017-08-31 DIAGNOSIS — N72 Inflammatory disease of cervix uteri: Secondary | ICD-10-CM | POA: Diagnosis present

## 2017-08-31 DIAGNOSIS — J449 Chronic obstructive pulmonary disease, unspecified: Secondary | ICD-10-CM | POA: Diagnosis not present

## 2017-08-31 DIAGNOSIS — I10 Essential (primary) hypertension: Secondary | ICD-10-CM | POA: Diagnosis not present

## 2017-08-31 DIAGNOSIS — F17210 Nicotine dependence, cigarettes, uncomplicated: Secondary | ICD-10-CM | POA: Insufficient documentation

## 2017-08-31 DIAGNOSIS — Z96642 Presence of left artificial hip joint: Secondary | ICD-10-CM | POA: Diagnosis not present

## 2017-08-31 DIAGNOSIS — N760 Acute vaginitis: Secondary | ICD-10-CM | POA: Diagnosis present

## 2017-08-31 DIAGNOSIS — Z79899 Other long term (current) drug therapy: Secondary | ICD-10-CM | POA: Diagnosis not present

## 2017-08-31 DIAGNOSIS — E119 Type 2 diabetes mellitus without complications: Secondary | ICD-10-CM | POA: Insufficient documentation

## 2017-08-31 DIAGNOSIS — J988 Other specified respiratory disorders: Secondary | ICD-10-CM | POA: Insufficient documentation

## 2017-08-31 DIAGNOSIS — J029 Acute pharyngitis, unspecified: Secondary | ICD-10-CM | POA: Diagnosis present

## 2017-08-31 DIAGNOSIS — H9203 Otalgia, bilateral: Secondary | ICD-10-CM | POA: Diagnosis not present

## 2017-08-31 LAB — URINALYSIS, ROUTINE W REFLEX MICROSCOPIC
BILIRUBIN URINE: NEGATIVE
GLUCOSE, UA: NEGATIVE mg/dL
Hgb urine dipstick: NEGATIVE
Ketones, ur: NEGATIVE mg/dL
Leukocytes, UA: NEGATIVE
Nitrite: NEGATIVE
PH: 5 (ref 5.0–8.0)
Protein, ur: NEGATIVE mg/dL
Specific Gravity, Urine: 1.013 (ref 1.005–1.030)

## 2017-08-31 LAB — WET PREP, GENITAL
Sperm: NONE SEEN
Trich, Wet Prep: NONE SEEN
Yeast Wet Prep HPF POC: NONE SEEN

## 2017-08-31 LAB — RAPID STREP SCREEN (MED CTR MEBANE ONLY): Streptococcus, Group A Screen (Direct): NEGATIVE

## 2017-08-31 MED ORDER — LIDOCAINE HCL (PF) 1 % IJ SOLN
INTRAMUSCULAR | Status: AC
  Filled 2017-08-31: qty 5

## 2017-08-31 MED ORDER — CEFTRIAXONE SODIUM 250 MG IJ SOLR
250.0000 mg | Freq: Once | INTRAMUSCULAR | Status: AC
  Administered 2017-08-31: 250 mg via INTRAMUSCULAR
  Filled 2017-08-31: qty 250

## 2017-08-31 MED ORDER — ACETAMINOPHEN 500 MG PO TABS
1000.0000 mg | ORAL_TABLET | Freq: Once | ORAL | Status: AC
  Administered 2017-08-31: 1000 mg via ORAL
  Filled 2017-08-31: qty 2

## 2017-08-31 MED ORDER — AZITHROMYCIN 1 G PO PACK
1.0000 g | PACK | Freq: Once | ORAL | Status: AC
  Administered 2017-08-31: 1 g via ORAL
  Filled 2017-08-31: qty 1

## 2017-08-31 MED ORDER — METRONIDAZOLE 500 MG PO TABS: 500.0000 mg | ORAL_TABLET | Freq: Two times a day (BID) | ORAL | 0 refills | Status: DC

## 2017-08-31 NOTE — ED Triage Notes (Signed)
Pt. Stated, Alicia Blake had a sore throat and cough and now my ears are hurting . Im also having some back pain and Im also having vaginal discharge and really bothering me.

## 2017-08-31 NOTE — ED Provider Notes (Signed)
[facility] EMERGENCY DEPARTMENT Provider Note   CSN: [id number] Arrival date & time: 08/31/17  1413     History   Chief Complaint Chief Complaint  Patient presents with  . Sore Throat  . Cough  . Back Pain  . Vaginal Discharge    HPI Alicia Blake is a 51 y.o. female.  Complains of sore throat bilateral ear pain and nonproductive cough onset last week.  She denies any shortness of breath.  She admits to subjective fever last night.  Treated herself with ibuprofen last night.  She also reports some mild dysuria and vaginal smell and dyspareunia for approximately 2 weeks.  No other associated symptoms no nausea or vomiting.  She has some pain in her vagina which is worse with sex. Presently discomfort is mild to moderate. HPI  Past Medical History:  Diagnosis Date  . Asthma   . Bipolar affect, depressed ([place])   . Bronchitis   . Chronic back pain   . COPD (chronic obstructive pulmonary disease) ([place])   . Diabetes mellitus without complication ([place])    DIET CONTROLLED entered by office  . DJD (degenerative joint disease)    BACK  . GERD (gastroesophageal reflux disease)   . Hypertension   . Pneumonia   . Pre-diabetes    dr entered diabetes without complications- no meds does not test sugar at home  . PTSD (post-traumatic stress disorder)   . Scoliosis   . Seizures ([place])    "when drinking" last yrs ago  . Shortness of breath dyspnea    exersion  . Thyroid disease   . UTI (lower urinary tract infection)     Patient Active Problem List   Diagnosis Date Noted  . Lumbar stenosis with neurogenic claudication 06/18/2017  . Tobacco abuse 04/19/2016  . Primary osteoarthritis of left hip 04/18/2016  . Opioid use disorder, mild, abuse ([place]) 09/08/2015  . Hypokalemia 09/07/2015  . Schizoaffective disorder, bipolar type ([place]) 09/07/2015  . Chronic pain syndrome 09/07/2015  . Alcohol use disorder, moderate, in sustained remission ([place]) 09/07/2015  .  Cocaine use disorder, moderate, in sustained remission ([place]) 09/07/2015  . Cannabis use disorder, moderate, in sustained remission ([place]) 09/07/2015  . DDD (degenerative disc disease), lumbar 09/07/2015  . Hx of fracture of ankle 09/07/2015  . Symptomatic Fibroids 08/09/2013  . Breast lump on left side at 9 o'clock position 06/29/2013  . Breast discharge 06/29/2013    Past Surgical History:  Procedure Laterality Date  . 2 LEFT TOES SURGERY  12/2014   finished only on right side  . BACK SURGERY    . JOINT REPLACEMENT     left hip  . LEFT ANKLE SURGERY  MARCH 2016  . NECK SURGERY  AS TEENAGER   STITCHES TO NECK   . SURGERY FOR CUT ON STOMACH  TEENAGER    OB History    Gravida Para Term Preterm AB Living   1 1 1     1    SAB TAB Ectopic Multiple Live Births                   Home Medications    Prior to Admission medications   Medication Sig Start Date End Date Taking? Authorizing Provider  ACETAMINOPHEN PO Take by mouth.    [provider]  albuterol (PROVENTIL HFA;VENTOLIN HFA) 108 (90 BASE) MCG/ACT inhaler Inhale 2 puffs into the lungs every 6 (six) hours as needed for wheezing.    [provider]  albuterol (PROVENTIL) (2.5 MG/3ML) 0.083% nebulizer solution Take 2.5 mg by nebulization every 6 (six) hours as needed for wheezing or shortness of breath.    [provider]  amoxicillin-clavulanate (AUGMENTIN) 875-125 MG tablet Take 1 tablet by mouth every 12 (twelve) hours. 06/26/17   Mackuen, Courteney Lyn, MD  atenolol-chlorthalidone (TENORETIC) 50-25 MG tablet Take 1 tablet by mouth daily.    [provider]  Cholecalciferol (VITAMIN D3) 1000 units CAPS Take 1,000 Units by mouth daily.    [provider]  docusate sodium (COLACE) 100 MG capsule Take 1 capsule (100 mg total) by mouth 2 (two) times daily. Patient taking differently: Take 100 mg by mouth daily.  05/14/16   Renette Butters, MD  Fluticasone-Salmeterol (ADVAIR) 250-50  MCG/DOSE AEPB Inhale 1 puff into the lungs 2 (two) times daily.    [provider]  ibuprofen (ADVIL,MOTRIN) 200 MG tablet Take 600 mg by mouth every 4 (four) hours as needed for moderate pain.    [provider]  lidocaine (XYLOCAINE) 5 % ointment Apply 1 application topically 4 (four) times daily as needed. 07/20/17   Sherlene Shams, MD  lithium carbonate (ESKALITH) 450 MG CR tablet Take 450 mg by mouth at bedtime.     [provider]  LITHIUM PO Take by mouth.    [provider]  losartan (COZAAR) 50 MG tablet Take 50 mg by mouth daily.    [provider]  losartan-hydrochlorothiazide (HYZAAR) 100-25 MG tablet Take 1 tablet by mouth daily.    [provider]  meloxicam (MOBIC) 15 MG tablet Take 15 mg by mouth daily.    [provider]  Menthol, Topical Analgesic, (BLUE-EMU MAXIMUM STRENGTH EX) Apply 1 application topically 3 (three) times daily as needed (pain).    [provider]  mirabegron ER (MYRBETRIQ) 25 MG TB24 tablet Take 25 mg by mouth daily.    [provider]  montelukast (SINGULAIR) 10 MG tablet Take 10 mg by mouth daily.     [provider]  Multiple Vitamin (MULTIVITAMIN) tablet Take 1 tablet by mouth daily.    [provider]  omeprazole (PRILOSEC) 20 MG capsule Take 1 capsule (20 mg total) by mouth daily. 05/14/16   Renette Butters, MD  oxyCODONE (ROXICODONE) 5 MG immediate release tablet Take 1 tablet (5 mg total) by mouth every 6 (six) hours as needed for severe pain. 06/19/17   Ashok Pall, MD  PARoxetine (PAXIL) 30 MG tablet Take 30 mg by mouth daily.    [provider]  polyethylene glycol powder (GLYCOLAX/MIRALAX) powder Take 17 g by mouth daily. As needed to keep bowel movements soft 07/20/17   Sherlene Shams, MD  QUEtiapine (SEROQUEL) 400 MG tablet Take 400 mg by mouth at bedtime.    [provider]  sodium chloride (OCEAN) 0.65 % SOLN nasal spray Place 1  spray into both nostrils 2 (two) times daily as needed for congestion.    [provider]  tiZANidine (ZANAFLEX) 4 MG tablet Take 1 tablet (4 mg total) by mouth every 6 (six) hours as needed for muscle spasms. 06/19/17   Ashok Pall, MD    Family History Family History  Problem Relation Age of Onset  . Hypertension Mother   . Hypertension Maternal Grandmother   . Hypertension Paternal Grandmother   . Diabetes Paternal Grandmother   . Hypertension Paternal Grandfather   . Diabetes Paternal Grandfather   . Alcohol abuse Paternal Aunt   . Mental illness  Cousin   . Heart attack Cousin   . Heart attack Maternal Uncle     Social History Social History   Tobacco Use  . Smoking status: Current Every Day Smoker    Packs/day: 0.25    Years: 26.00    Pack years: 6.50    Types: Cigarettes  . Smokeless tobacco: Current User  Substance Use Topics  . Alcohol use: No    Comment: quit date 01/17/2013  . Drug use: No    Comment: quit date 04-17-2014 FOR CRACK     Allergies   Risperidone and related; Buspirone; Crab [shellfish allergy]; Other; Prednisone; and Gabapentin   Review of Systems Review of Systems  Constitutional: Negative.   HENT: Positive for ear pain and sore throat.   Respiratory: Positive for cough.   Cardiovascular: Negative.   Gastrointestinal: Negative.   Genitourinary: Positive for dyspareunia, dysuria and vaginal discharge.       Postmenopausal  Musculoskeletal: Positive for back pain.       Low midline back pain for several months  Skin: Negative.   Neurological: Negative.   Psychiatric/Behavioral: Negative.   All other systems reviewed and are negative.    Physical Exam Updated Vital Signs BP 138/76 (BP Location: Left Arm)   Pulse (!) 102   Temp 98.4 F (36.9 C) (Oral)   Ht 5\' 6"  (1.676 m)   Wt 115.2 kg (254 lb)   LMP 12/09/2015 Comment: very irregular  SpO2 100%   BMI 41.00 kg/m   Physical Exam  Constitutional: She is oriented to  person, place, and time. She appears well-developed and well-nourished. No distress.  HENT:  Head: Normocephalic and atraumatic.  Right Ear: External ear normal.  Left Ear: External ear normal.  Mouth/Throat: No oropharyngeal exudate.  biLateral tympanic membranes normal oropharynx minimally red.  Uvula midline  Eyes: Conjunctivae are normal. Pupils are equal, round, and reactive to light.  Neck: Neck supple. No tracheal deviation present. No thyromegaly present.  Cardiovascular: Normal rate and regular rhythm.  No murmur heard. Pulmonary/Chest: Effort normal and breath sounds normal.  Abdominal: Soft. Bowel sounds are normal. She exhibits no distension. There is no tenderness.  Obese  Genitourinary:  Genitourinary Comments: Pelvic exam performed with chaperone.  Slight amount of whitish vaginal discharge.  Several eyes closed.  Mild cervical motion tenderness no adnexal masses or tenderness  Musculoskeletal: Normal range of motion. She exhibits no edema or tenderness.  Back with surgical scar over lumbar area, no redness no swelling no point tenderness along spine  Neurological: She is alert and oriented to person, place, and time. Coordination normal.  Skin: Skin is warm and dry. No rash noted.  Psychiatric: She has a normal mood and affect.  Nursing note and vitals reviewed.    ED Treatments / Results  Labs (all labs ordered are listed, but only abnormal results are displayed) Labs Reviewed  RAPID STREP SCREEN (NOT AT [facility])  CULTURE, GROUP A STREP [facility])    EKG  EKG Interpretation None       Radiology No results found.  Procedures Procedures (including critical care time)  Medications Ordered in ED Medications - No data to display  Results for orders placed or performed during the hospital encounter of 08/31/17  Rapid strep screen  Result Value Ref Range   Streptococcus, Group A Screen (Direct) NEGATIVE NEGATIVE  Wet prep, genital  Result Value Ref Range    Yeast Wet Prep HPF POC NONE SEEN NONE SEEN   Trich, Wet Prep NONE  SEEN NONE SEEN   Clue Cells Wet Prep HPF POC PRESENT (A) NONE SEEN   WBC, Wet Prep HPF POC FEW (A) NONE SEEN   Sperm NONE SEEN   Urinalysis, Routine w reflex microscopic  Result Value Ref Range   Color, Urine YELLOW YELLOW   APPearance HAZY (A) CLEAR   Specific Gravity, Urine 1.013 1.005 - 1.030   pH 5.0 5.0 - 8.0   Glucose, UA NEGATIVE NEGATIVE mg/dL   Hgb urine dipstick NEGATIVE NEGATIVE   Bilirubin Urine NEGATIVE NEGATIVE   Ketones, ur NEGATIVE NEGATIVE mg/dL   Protein, ur NEGATIVE NEGATIVE mg/dL   Nitrite NEGATIVE NEGATIVE   Leukocytes, UA NEGATIVE NEGATIVE   No results found. Initial Impression / Assessment and Plan / ED Course  I have reviewed the triage vital signs and the nursing notes.  Pertinent labs & imaging results that were available during my care of the patient were reviewed by me and considered in my medical decision making (see chart for details).     Clinically patient has viral respiratory illness.  Also treat for STDs as clinically she has cervicitis and vaginitis.  She received Rocephin and Zithromax while here.  Safe sex encouraged.  Prescription Flagyl she reports that her sexual partner is not monogamous.  Follow-up with Dr.Osei bonsu if not feeling improved in a week.  Counseled patient for 5 minutes on smoking cessation  Final Clinical Impressions(s) / ED Diagnoses  Dx #1 viral respiratory illness #2 cervicitis #3 vaginitis #4 tobacco abuse Final diagnoses:  None    New Prescriptions This SmartLink is deprecated. Use AVSMEDLIST instead to display the medication list for a patient.   Orlie Dakin, MD 08/31/17 [phone]

## 2017-08-31 NOTE — Discharge Instructions (Signed)
Use a condom each time that you have sex.  See Dr. Emilee Hero bonsu's office if not feeling better after a week.  Take Tylenol as directed for pain.  Ask your provider at Dr.Osei bonsu's office to help you to stop smoking .Take Tylenol as directed for pain

## 2017-09-01 ENCOUNTER — Telehealth: Payer: Self-pay | Admitting: *Deleted

## 2017-09-01 LAB — HIV ANTIBODY (ROUTINE TESTING W REFLEX): HIV Screen 4th Generation wRfx: NONREACTIVE

## 2017-09-01 LAB — GC/CHLAMYDIA PROBE AMP (~~LOC~~) NOT AT ARMC
CHLAMYDIA, DNA PROBE: NEGATIVE
NEISSERIA GONORRHEA: NEGATIVE

## 2017-09-01 LAB — RPR: RPR Ser Ql: NONREACTIVE

## 2017-09-01 NOTE — Telephone Encounter (Signed)
Pt misplaced Flagyl Rx.  EDCM called in to [facility] on [address] as requested.

## 2017-09-03 LAB — CULTURE, GROUP A STREP (THRC)

## 2017-09-24 ENCOUNTER — Encounter (HOSPITAL_COMMUNITY): Payer: Self-pay

## 2017-12-09 ENCOUNTER — Other Ambulatory Visit: Payer: Self-pay | Admitting: Internal Medicine

## 2017-12-09 DIAGNOSIS — R3915 Urgency of urination: Secondary | ICD-10-CM

## 2017-12-09 DIAGNOSIS — N941 Unspecified dyspareunia: Secondary | ICD-10-CM

## 2017-12-12 ENCOUNTER — Ambulatory Visit
Admission: RE | Admit: 2017-12-12 | Discharge: 2017-12-12 | Disposition: A | Discharge status: Home / Self Care | Payer: Medicaid Other | Source: Ambulatory Visit | Attending: Internal Medicine | Admitting: Internal Medicine

## 2017-12-12 DIAGNOSIS — N941 Unspecified dyspareunia: Secondary | ICD-10-CM

## 2017-12-12 DIAGNOSIS — R3915 Urgency of urination: Secondary | ICD-10-CM

## 2017-12-16 ENCOUNTER — Other Ambulatory Visit: Payer: Self-pay | Admitting: Physician Assistant

## 2017-12-16 DIAGNOSIS — R1313 Dysphagia, pharyngeal phase: Secondary | ICD-10-CM | POA: Insufficient documentation

## 2017-12-16 DIAGNOSIS — K219 Gastro-esophageal reflux disease without esophagitis: Secondary | ICD-10-CM | POA: Insufficient documentation

## 2017-12-16 DIAGNOSIS — R102 Pelvic and perineal pain: Secondary | ICD-10-CM

## 2017-12-16 DIAGNOSIS — H6121 Impacted cerumen, right ear: Secondary | ICD-10-CM | POA: Insufficient documentation

## 2017-12-26 ENCOUNTER — Other Ambulatory Visit: Payer: Self-pay | Admitting: Neurosurgery

## 2017-12-26 DIAGNOSIS — M5441 Lumbago with sciatica, right side: Secondary | ICD-10-CM

## 2018-01-11 ENCOUNTER — Other Ambulatory Visit: Payer: Self-pay

## 2018-01-17 ENCOUNTER — Ambulatory Visit
Admission: RE | Admit: 2018-01-17 | Discharge: 2018-01-17 | Disposition: A | Discharge status: Home / Self Care | Payer: Medicaid Other | Source: Ambulatory Visit | Attending: Neurosurgery | Admitting: Neurosurgery

## 2018-01-17 DIAGNOSIS — M5441 Lumbago with sciatica, right side: Secondary | ICD-10-CM

## 2018-01-17 MED ORDER — GADOBENATE DIMEGLUMINE 529 MG/ML IV SOLN
20.0000 mL | Freq: Once | INTRAVENOUS | Status: AC | PRN
  Administered 2018-01-17: 20 mL via INTRAVENOUS

## 2018-03-04 ENCOUNTER — Encounter (HOSPITAL_COMMUNITY): Payer: Self-pay | Admitting: Emergency Medicine

## 2018-03-04 ENCOUNTER — Other Ambulatory Visit: Payer: Self-pay

## 2018-03-04 ENCOUNTER — Emergency Department (HOSPITAL_COMMUNITY)
Admission: EM | Admit: 2018-03-04 | Discharge: 2018-03-04 | Disposition: A | Discharge status: Home / Self Care | Payer: Medicaid Other | Attending: Emergency Medicine | Admitting: Emergency Medicine

## 2018-03-04 DIAGNOSIS — F17210 Nicotine dependence, cigarettes, uncomplicated: Secondary | ICD-10-CM | POA: Insufficient documentation

## 2018-03-04 DIAGNOSIS — M654 Radial styloid tenosynovitis [de Quervain]: Secondary | ICD-10-CM | POA: Diagnosis not present

## 2018-03-04 DIAGNOSIS — Z79899 Other long term (current) drug therapy: Secondary | ICD-10-CM | POA: Diagnosis not present

## 2018-03-04 DIAGNOSIS — I10 Essential (primary) hypertension: Secondary | ICD-10-CM | POA: Insufficient documentation

## 2018-03-04 DIAGNOSIS — J45909 Unspecified asthma, uncomplicated: Secondary | ICD-10-CM | POA: Diagnosis not present

## 2018-03-04 DIAGNOSIS — Z96642 Presence of left artificial hip joint: Secondary | ICD-10-CM | POA: Diagnosis not present

## 2018-03-04 DIAGNOSIS — E119 Type 2 diabetes mellitus without complications: Secondary | ICD-10-CM | POA: Insufficient documentation

## 2018-03-04 DIAGNOSIS — M25532 Pain in left wrist: Secondary | ICD-10-CM | POA: Diagnosis present

## 2018-03-04 MED ORDER — DICLOFENAC SODIUM 1 % TD GEL: 4.0000 g | Freq: Four times a day (QID) | TRANSDERMAL | 0 refills | Status: DC

## 2018-03-04 NOTE — ED Triage Notes (Signed)
Pt reports having left wrist pain that is worse with movement.

## 2018-03-04 NOTE — ED Provider Notes (Signed)
Alicia Blake Provider Note   CSN: [id number] Arrival date & time: 03/04/18  0500     History   Chief Complaint Chief Complaint  Patient presents with  . Wrist Pain    HPI Alicia Blake is a 52 y.o. female emergency department chief complaint of left wrist pain.  Patient states that her wrist has been bothering her for about a month and a half.  She has worsening pain with movement of the left thumb.  He has not been taking anything for the pain.  She has not not use any ice.  Patient denies any known injuries or activities that might have exacerbated her symptoms.  She denies numbness tingling, loss of grip strength.  HPI  Past Medical History:  Diagnosis Date  . Asthma   . Bipolar affect, depressed ([place])   . Bronchitis   . Chronic back pain   . COPD (chronic obstructive pulmonary disease) ([place])   . Diabetes mellitus without complication (Alicia Blake)    DIET CONTROLLED entered by office  . DJD (degenerative joint disease)    BACK  . GERD (gastroesophageal reflux disease)   . Hypertension   . Pneumonia   . Pre-diabetes    dr entered diabetes without complications- no meds does not test sugar at home  . PTSD (post-traumatic stress disorder)   . Scoliosis   . Seizures (Alicia Blake)    "when drinking" last yrs ago  . Shortness of breath dyspnea    exersion  . Thyroid disease   . UTI (lower urinary tract infection)     Patient Active Problem List   Diagnosis Date Noted  . Viral respiratory illness 08/31/2017  . Cervicitis 08/31/2017  . Acute vaginitis 08/31/2017  . Lumbar stenosis with neurogenic claudication 06/18/2017  . Tobacco abuse 04/19/2016  . Primary osteoarthritis of left hip 04/18/2016  . Opioid use disorder, mild, abuse (Alicia Blake) 09/08/2015  . Hypokalemia 09/07/2015  . Schizoaffective disorder, bipolar type (Alicia Blake) 09/07/2015  . Chronic pain syndrome 09/07/2015  . Alcohol use disorder, moderate, in sustained remission (Alicia Blake) 09/07/2015  .  Cocaine use disorder, moderate, in sustained remission (Alicia Blake) 09/07/2015  . Cannabis use disorder, moderate, in sustained remission (Alicia Blake) 09/07/2015  . DDD (degenerative disc disease), lumbar 09/07/2015  . Hx of fracture of ankle 09/07/2015  . Symptomatic Fibroids 08/09/2013  . Breast lump on left side at 9 o'clock position 06/29/2013  . Breast discharge 06/29/2013    Past Surgical History:  Procedure Laterality Date  . 2 LEFT TOES SURGERY  12/2014   finished only on right side  . BACK SURGERY    . BALLOON DILATION N/A 12/14/2015   Procedure: BALLOON DILATION;  Surgeon: Alicia Irani, MD;  Location: [facility];  Service: Endoscopy;  Laterality: N/A;  . COLONOSCOPY WITH PROPOFOL N/A 12/14/2015   Procedure: COLONOSCOPY WITH PROPOFOL;  Surgeon: Alicia Irani, MD;  Location: [facility];  Service: Endoscopy;  Laterality: N/A;  . ESOPHAGOGASTRODUODENOSCOPY (EGD) WITH PROPOFOL N/A 12/14/2015   Procedure: ESOPHAGOGASTRODUODENOSCOPY (EGD) WITH PROPOFOL;  Surgeon: Alicia Irani, MD;  Location: [facility];  Service: Endoscopy;  Laterality: N/A;  . JOINT REPLACEMENT     left hip  . LEFT ANKLE SURGERY  MARCH 2016  . LUMBAR LAMINECTOMY/DECOMPRESSION MICRODISCECTOMY N/A 06/18/2017   Procedure: LAMINECTOMY AND FORAMINOTOMY LUMBAR FOUR - LUMBAR FIVE;  Surgeon: Alicia Pall, MD;  Location: [place];  Service: Neurosurgery;  Laterality: N/A;  LAMINECTOMY AND FORAMINOTOMY LUMBAR 4- LUMBAR 5  . NECK SURGERY  AS TEENAGER  STITCHES TO NECK   . SURGERY FOR CUT ON STOMACH  TEENAGER  . TOTAL HIP ARTHROPLASTY Left 05/14/2016   Procedure: TOTAL HIP ARTHROPLASTY ANTERIOR APPROACH;  Surgeon: Alicia Butters, MD;  Location: [place];  Service: Orthopedics;  Laterality: Left;     OB History    Gravida  1   Para  1   Term  1   Preterm      AB      Living  1     SAB      TAB      Ectopic      Multiple      Live Births               Home Medications    Prior to Admission medications   Medication  Sig Start Date End Date Taking? Authorizing Provider  ACETAMINOPHEN PO Take by mouth.    [provider]  albuterol (PROVENTIL HFA;VENTOLIN HFA) 108 (90 BASE) MCG/ACT inhaler Inhale 2 puffs into the lungs every 6 (six) hours as needed for wheezing.    [provider]  albuterol (PROVENTIL) (2.5 MG/3ML) 0.083% nebulizer solution Take 2.5 mg by nebulization every 6 (six) hours as needed for wheezing or shortness of breath.    [provider]  amoxicillin-clavulanate (AUGMENTIN) 875-125 MG tablet Take 1 tablet by mouth every 12 (twelve) hours. 06/26/17   Blake, Alicia Lyn, MD  atenolol-chlorthalidone (TENORETIC) 50-25 MG tablet Take 1 tablet by mouth daily.    [provider]  Cholecalciferol (VITAMIN D3) 1000 units CAPS Take 1,000 Units by mouth daily.    [provider]  docusate sodium (COLACE) 100 MG capsule Take 1 capsule (100 mg total) by mouth 2 (two) times daily. Patient taking differently: Take 100 mg by mouth daily.  05/14/16   Alicia Butters, MD  Fluticasone-Salmeterol (ADVAIR) 250-50 MCG/DOSE AEPB Inhale 1 puff into the lungs 2 (two) times daily.    [provider]  ibuprofen (ADVIL,MOTRIN) 200 MG tablet Take 600 mg by mouth every 4 (four) hours as needed for moderate pain.    [provider]  lidocaine (XYLOCAINE) 5 % ointment Apply 1 application topically 4 (four) times daily as needed. 07/20/17   Alicia Luna, MD  lithium carbonate (ESKALITH) 450 MG CR tablet Take 450 mg by mouth at bedtime.     [provider]  LITHIUM PO Take by mouth.    [provider]  losartan (COZAAR) 50 MG tablet Take 50 mg by mouth daily.    [provider]  losartan-hydrochlorothiazide (HYZAAR) 100-25 MG tablet Take 1 tablet by mouth daily.    [provider]  meloxicam (MOBIC) 15 MG tablet Take 15 mg by mouth daily.    [provider]  Menthol, Topical Analgesic, (BLUE-EMU MAXIMUM STRENGTH  EX) Apply 1 application topically 3 (three) times daily as needed (pain).    [provider]  metroNIDAZOLE (FLAGYL) 500 MG tablet Take 1 tablet (500 mg total) 2 (two) times daily by mouth. One po bid x 7 days 08/31/17   Orlie Dakin, MD  mirabegron ER (MYRBETRIQ) 25 MG TB24 tablet Take 25 mg by mouth daily.    [provider]  montelukast (SINGULAIR) 10 MG tablet Take 10 mg by mouth daily.     [provider]  Multiple Vitamin (MULTIVITAMIN) tablet Take 1 tablet by mouth daily.    [provider]  omeprazole (PRILOSEC) 20 MG capsule Take 1 capsule (20 mg total) by  mouth daily. 05/14/16   Alicia Butters, MD  oxyCODONE (ROXICODONE) 5 MG immediate release tablet Take 1 tablet (5 mg total) by mouth every 6 (six) hours as needed for severe pain. 06/19/17   Alicia Pall, MD  PARoxetine (PAXIL) 30 MG tablet Take 30 mg by mouth daily.    [provider]  polyethylene glycol powder (GLYCOLAX/MIRALAX) powder Take 17 g by mouth daily. As needed to keep bowel movements soft 07/20/17   Alicia Luna, MD  QUEtiapine (SEROQUEL) 400 MG tablet Take 400 mg by mouth at bedtime.    [provider]  sodium chloride (OCEAN) 0.65 % SOLN nasal spray Place 1 spray into both nostrils 2 (two) times daily as needed for congestion.    [provider]  tiZANidine (ZANAFLEX) 4 MG tablet Take 1 tablet (4 mg total) by mouth every 6 (six) hours as needed for muscle spasms. 06/19/17   Alicia Pall, MD    Family History Family History  Problem Relation Age of Onset  . Hypertension Mother   . Hypertension Maternal Grandmother   . Hypertension Paternal Grandmother   . Diabetes Paternal Grandmother   . Hypertension Paternal Grandfather   . Diabetes Paternal Grandfather   . Alcohol abuse Paternal Aunt   . Mental illness Cousin   . Heart attack Cousin   . Heart attack Maternal Uncle     Social History Social History   Tobacco Use  . Smoking status:  Current Every Day Smoker    Packs/day: 0.25    Years: 26.00    Pack years: 6.50    Types: Cigarettes  . Smokeless tobacco: Current User  Substance Use Topics  . Alcohol use: No    Comment: quit date 01/17/2013  . Drug use: No    Types: "Crack" cocaine, Heroin    Comment: quit date 04-17-2014 FOR CRACK     Allergies   Risperidone and related; Buspirone; Crab [shellfish allergy]; Other; Prednisone; and Gabapentin   Review of Systems Review of Systems Positive for wrist and thumb pain, negative for fevers, chills, numbness or weakness.  Physical Exam Updated Vital Signs BP 130/73 (BP Location: Left Arm)   Pulse 79   Temp 98.1 F (36.7 C) (Oral)   Resp 16   Ht 5\' 6"  (1.676 m)   Wt 114.3 kg (252 lb)   LMP 12/09/2015 Comment: very irregular  SpO2 97%   BMI 40.67 kg/m   Physical Exam Physical Exam  Nursing note and vitals reviewed. Constitutional: She is oriented to person, place, and time. She appears well-developed and well-nourished. No distress.  HENT:  Head: Normocephalic and atraumatic.  Eyes: Conjunctivae normal and EOM are normal. Pupils are equal, round, and reactive to light. No scleral icterus.  Neck: Normal range of motion.  Cardiovascular: Normal rate, regular rhythm and normal heart sounds.  Exam reveals no gallop and no friction rub.   No murmur heard. Pulmonary/Chest: Effort normal and breath sounds normal. No respiratory distress.  Abdominal: Soft. Bowel sounds are normal. She exhibits no distension and no mass. There is no tenderness. There is no guarding.  Neurological: She is alert and oriented to person, place, and time.  Musculoskeletal: Tenderness in the left forearm over the extensor pollicis, mild swelling.  Full range of motion of the thumb and normal grip strength.  Reports pain with movement of the thumb, no clicking popping or locking.  No scaphoid tenderness. skin: Skin is warm and dry. She is not diaphoretic.     ED  Treatments / Results    Labs (all labs ordered are listed, but only abnormal results are displayed) Labs Reviewed - No data to display  EKG None  Radiology No results found.  Procedures Procedures (including critical care time)  Medications Ordered in ED Medications - No data to display   Initial Impression / Assessment and Plan / ED Course  I have reviewed the triage vital signs and the nursing notes.  Pertinent labs & imaging results that were available during my care of the patient were reviewed by me and considered in my medical decision making (see chart for details).     Patient with tenderness and swelling consistent with tenosynovitis.  Doubt infectious etiology.  Patient will be placed in thumb spica, cryotherapy and topical anti-inflammatories as oral anti-inflammatories conflict with her lithium.  Patient is to follow-up with her PCP and hand specialist she appears appropriate for discharge at this time.  Final Clinical Impressions(s) / ED Diagnoses   Final diagnoses:  Left wrist pain  De Quervain's tenosynovitis, left    ED Discharge Orders    None       Margarita Mail, PA-C 03/04/18 8413    Daleen Bo, MD 03/04/18 1416

## 2018-03-04 NOTE — Discharge Instructions (Signed)
Contact a health care provider if: Your pain, tenderness, or swelling gets worse, even if you have had treatment. You have numbness or tingling in your wrist, hand, or fingers on the injured side.

## 2018-04-22 ENCOUNTER — Ambulatory Visit (HOSPITAL_COMMUNITY)
Admission: EM | Admit: 2018-04-22 | Discharge: 2018-04-22 | Disposition: A | Discharge status: Home / Self Care | Payer: Medicaid Other | Attending: Family Medicine | Admitting: Family Medicine

## 2018-04-22 ENCOUNTER — Encounter (HOSPITAL_COMMUNITY): Payer: Self-pay | Admitting: Family Medicine

## 2018-04-22 DIAGNOSIS — K5900 Constipation, unspecified: Secondary | ICD-10-CM

## 2018-04-22 MED ORDER — POLYETHYLENE GLYCOL 3350 17 GM/SCOOP PO POWD: 17.0000 g | Freq: Every day | ORAL | 0 refills | Status: DC

## 2018-04-22 NOTE — ED Provider Notes (Signed)
[place]   [id number] 04/22/18 Arrival Time: 1838   SUBJECTIVE:  Alicia Blake is a 52 y.o. female who presents to the urgent care with complaint of constipation and lower abdominal pain.  Last colonoscopy 2017 by Dr. Amedeo Plenty.  Some diverticulosis  Patient's symptoms began 2 days ago with loss of appetite and bloating.  She disimpacted herself yesterday of hard stool with her finger, but this induced some bleeding and she is reluctant to go back there.  No fever or vomiting.   Past Medical History:  Diagnosis Date  . Asthma   . Bipolar affect, depressed ([place])   . Bronchitis   . Chronic back pain   . COPD (chronic obstructive pulmonary disease) ([place])   . Diabetes mellitus without complication ([place])    DIET CONTROLLED entered by office  . DJD (degenerative joint disease)    BACK  . GERD (gastroesophageal reflux disease)   . Hypertension   . Pneumonia   . Pre-diabetes    dr entered diabetes without complications- no meds does not test sugar at home  . PTSD (post-traumatic stress disorder)   . Scoliosis   . Seizures ([place])    "when drinking" last yrs ago  . Shortness of breath dyspnea    exersion  . Thyroid disease   . UTI (lower urinary tract infection)    Family History  Problem Relation Age of Onset  . Hypertension Mother   . Hypertension Maternal Grandmother   . Hypertension Paternal Grandmother   . Diabetes Paternal Grandmother   . Hypertension Paternal Grandfather   . Diabetes Paternal Grandfather   . Alcohol abuse Paternal Aunt   . Mental illness Cousin   . Heart attack Cousin   . Heart attack Maternal Uncle    Social History   Socioeconomic History  . Marital status: Single    Spouse name: Not on file  . Number of children: Not on file  . Years of education: Not on file  . Highest education level: Not on file  Occupational History  . Not on file  Social Needs  . Financial resource strain: Not on file  . Food insecurity:    Worry: Not  on file    Inability: Not on file  . Transportation needs:    Medical: Not on file    Non-medical: Not on file  Tobacco Use  . Smoking status: Current Every Day Smoker    Packs/day: 0.25    Years: 26.00    Pack years: 6.50    Types: Cigarettes  . Smokeless tobacco: Current User  Substance and Sexual Activity  . Alcohol use: No    Comment: quit date 01/17/2013  . Drug use: No    Types: "Crack" cocaine, Heroin    Comment: quit date 04-17-2014 FOR CRACK  . Sexual activity: Yes    Birth control/protection: None  Lifestyle  . Physical activity:    Days per week: Not on file    Minutes per session: Not on file  . Stress: Not on file  Relationships  . Social connections:    Talks on phone: Not on file    Gets together: Not on file    Attends religious service: Not on file    Active member of club or organization: Not on file    Attends meetings of clubs or organizations: Not on file    Relationship status: Not on file  . Intimate partner violence:    Fear of current or ex partner: Not on  file    Emotionally abused: Not on file    Physically abused: Not on file    Forced sexual activity: Not on file  Other Topics Concern  . Not on file  Social History Narrative  . Not on file   No outpatient medications have been marked as taking for the 04/22/18 encounter [facility] Encounter).   Allergies  Allergen Reactions  . Risperidone And Related Swelling and Other (See Comments)    Facial swelling  . Buspirone Swelling  . Crab [Shellfish Allergy] Swelling and Other (See Comments)    FEVER  . Other Hives, Itching and Other (See Comments)    crablegs only   . Prednisone     She reported history of mouth swelling with prednisone  . Gabapentin Nausea And Vomiting and Swelling    Speech impairment       ROS: As per HPI, remainder of ROS negative.   OBJECTIVE:   Vitals:   04/22/18 1856  BP: 128/70  Pulse: 70  Resp: 18  Temp: 98 F (36.7 C)  SpO2: 100%     General  appearance: alert; no distress Eyes: PERRL; EOMI; conjunctiva normal HENT: normocephalic; atraumatic;oral mucosa normal Neck: supple Lungs: clear to auscultation bilaterally Heart: regular rate and rhythm Abdomen: soft, non-tender; bowel sounds normal; no masses or organomegaly; no guarding or rebound tenderness Back: no CVA tenderness Extremities: no cyanosis or edema; symmetrical with no gross deformities Skin: warm and dry Neurologic: normal gait; grossly normal Psychological: alert and cooperative; normal mood and affect      Labs:  Results for orders placed or performed during the hospital encounter of 08/31/17  Rapid strep screen  Result Value Ref Range   Streptococcus, Group A Screen (Direct) NEGATIVE NEGATIVE  Culture, group A strep  Result Value Ref Range   Specimen Description THROAT    Special Requests NONE Reflexed from [id number]    Culture NO GROUP A STREP (S.PYOGENES) ISOLATED    Report Status 09/03/2017 FINAL   Wet prep, genital  Result Value Ref Range   Yeast Wet Prep HPF POC NONE SEEN NONE SEEN   Trich, Wet Prep NONE SEEN NONE SEEN   Clue Cells Wet Prep HPF POC PRESENT (A) NONE SEEN   WBC, Wet Prep HPF POC FEW (A) NONE SEEN   Sperm NONE SEEN   Urinalysis, Routine w reflex microscopic  Result Value Ref Range   Color, Urine YELLOW YELLOW   APPearance HAZY (A) CLEAR   Specific Gravity, Urine 1.013 1.005 - 1.030   pH 5.0 5.0 - 8.0   Glucose, UA NEGATIVE NEGATIVE mg/dL   Hgb urine dipstick NEGATIVE NEGATIVE   Bilirubin Urine NEGATIVE NEGATIVE   Ketones, ur NEGATIVE NEGATIVE mg/dL   Protein, ur NEGATIVE NEGATIVE mg/dL   Nitrite NEGATIVE NEGATIVE   Leukocytes, UA NEGATIVE NEGATIVE  RPR  Result Value Ref Range   RPR Ser Ql Non Reactive Non Reactive  HIV antibody  Result Value Ref Range   HIV Screen 4th Generation wRfx Non Reactive Non Reactive  GC/Chlamydia probe amp ([place])not at [facility]  Result Value Ref Range   Chlamydia Negative    Neisseria  gonorrhea Negative     Labs Reviewed - No data to display  No results found.     ASSESSMENT & PLAN:  1. Constipation, unspecified constipation type   Take the polyethylene glycol powder in juice every hour for 3 hours tonight and tomorrow morning. Please return if symptoms persist or worsen  Meds ordered this encounter  Medications  . polyethylene glycol powder (GLYCOLAX/MIRALAX) powder    Sig: Take 17 g by mouth daily. As needed to keep bowel movements soft    Dispense:  255 g    Refill:  0    Reviewed expectations re: course of current medical issues. Questions answered. Outlined signs and symptoms indicating need for more acute intervention. Patient verbalized understanding. After Visit Summary given.    Procedures:      Robyn Haber, MD 04/22/18 1909

## 2018-04-22 NOTE — ED Triage Notes (Signed)
Pt presents with constipation and lower abdominal pain.

## 2018-04-22 NOTE — Discharge Instructions (Addendum)
Take the polyethylene glycol powder in juice every hour for 3 hours tonight and tomorrow morning. Please return if symptoms persist or worsen

## 2018-05-04 ENCOUNTER — Encounter (HOSPITAL_COMMUNITY): Payer: Self-pay | Admitting: *Deleted

## 2018-05-04 ENCOUNTER — Ambulatory Visit (HOSPITAL_COMMUNITY)
Admission: EM | Admit: 2018-05-04 | Discharge: 2018-05-04 | Disposition: A | Discharge status: Home / Self Care | Payer: Medicaid Other | Attending: Family Medicine | Admitting: Family Medicine

## 2018-05-04 ENCOUNTER — Other Ambulatory Visit: Payer: Self-pay

## 2018-05-04 DIAGNOSIS — J019 Acute sinusitis, unspecified: Secondary | ICD-10-CM | POA: Diagnosis not present

## 2018-05-04 DIAGNOSIS — R42 Dizziness and giddiness: Secondary | ICD-10-CM | POA: Diagnosis not present

## 2018-05-04 DIAGNOSIS — R110 Nausea: Secondary | ICD-10-CM

## 2018-05-04 LAB — POCT I-STAT, CHEM 8
BUN: 11 mg/dL (ref 6–20)
CALCIUM ION: 1.17 mmol/L (ref 1.15–1.40)
CHLORIDE: 92 mmol/L — AB (ref 98–111)
Creatinine, Ser: 0.8 mg/dL (ref 0.44–1.00)
GLUCOSE: 118 mg/dL — AB (ref 70–99)
HCT: 42 % (ref 36.0–46.0)
Hemoglobin: 14.3 g/dL (ref 12.0–15.0)
Potassium: 3.3 mmol/L — ABNORMAL LOW (ref 3.5–5.1)
SODIUM: 137 mmol/L (ref 135–145)
TCO2: 31 mmol/L (ref 22–32)

## 2018-05-04 MED ORDER — AMOXICILLIN-POT CLAVULANATE 875-125 MG PO TABS: 1.0000 | ORAL_TABLET | Freq: Two times a day (BID) | ORAL | 0 refills | Status: AC

## 2018-05-04 MED ORDER — TRIAMCINOLONE ACETONIDE 55 MCG/ACT NA AERO: 2.0000 | INHALATION_SPRAY | Freq: Every day | NASAL | 12 refills | Status: DC

## 2018-05-04 NOTE — ED Provider Notes (Signed)
[place]    CSN: [id number] Arrival date & time: 05/04/18  1648     History   Chief Complaint Chief Complaint  Patient presents with  . Dizziness    HPI Alicia Blake is a 52 y.o. female.   Alicia Blake presents with complaints of light headedness, nausea, and shaking which has been ongoing for the past three days. No head injury. No chest pain  Or shortness of breath . No headache. Has had congestion and sore throat. No fall. No vomiting or diarrhea. Has been taking dulcolax and what sounds like miralax to promote a BM. Has been bloated. Did have a BM today. Has been eating and drinking, decreased intake today however. Has been more fatigued.  States had been of off her allergy medications which she recently restarted as well as recently restarting her lasix. She does take potassium as well.  hiso f bipolar, gerd, htn, seizures, thyroid disease. She has been ambulatory.     ROS per HPI.      Past Medical History:  Diagnosis Date  . Asthma   . Bipolar affect, depressed ([place])   . Bronchitis   . Chronic back pain   . COPD (chronic obstructive pulmonary disease) ([place])   . DJD (degenerative joint disease)    BACK  . GERD (gastroesophageal reflux disease)   . Hypertension   . Pneumonia   . Pre-diabetes    dr entered diabetes without complications- no meds does not test sugar at home  . PTSD (post-traumatic stress disorder)   . Scoliosis   . Seizures ([place])    "when drinking" last yrs ago  . Shortness of breath dyspnea    exersion  . Thyroid disease   . UTI (lower urinary tract infection)     Patient Active Problem List   Diagnosis Date Noted  . Viral respiratory illness 08/31/2017  . Cervicitis 08/31/2017  . Acute vaginitis 08/31/2017  . Lumbar stenosis with neurogenic claudication 06/18/2017  . Tobacco abuse 04/19/2016  . Primary osteoarthritis of left hip 04/18/2016  . Opioid use disorder, mild, abuse ([place]) 09/08/2015  . Hypokalemia 09/07/2015  .  Schizoaffective disorder, bipolar type ([place]) 09/07/2015  . Chronic pain syndrome 09/07/2015  . Alcohol use disorder, moderate, in sustained remission ([place]) 09/07/2015  . Cocaine use disorder, moderate, in sustained remission ([place]) 09/07/2015  . Cannabis use disorder, moderate, in sustained remission ([place]) 09/07/2015  . DDD (degenerative disc disease), lumbar 09/07/2015  . Hx of fracture of ankle 09/07/2015  . Symptomatic Fibroids 08/09/2013  . Breast lump on left side at 9 o'clock position 06/29/2013  . Breast discharge 06/29/2013    Past Surgical History:  Procedure Laterality Date  . 2 LEFT TOES SURGERY  12/2014   finished only on right side  . BACK SURGERY    . BALLOON DILATION N/A 12/14/2015   Procedure: BALLOON DILATION;  Surgeon: Teena Irani, MD;  Location: [facility];  Service: Endoscopy;  Laterality: N/A;  . COLONOSCOPY WITH PROPOFOL N/A 12/14/2015   Procedure: COLONOSCOPY WITH PROPOFOL;  Surgeon: Teena Irani, MD;  Location: [facility];  Service: Endoscopy;  Laterality: N/A;  . ESOPHAGOGASTRODUODENOSCOPY (EGD) WITH PROPOFOL N/A 12/14/2015   Procedure: ESOPHAGOGASTRODUODENOSCOPY (EGD) WITH PROPOFOL;  Surgeon: Teena Irani, MD;  [facility];  Service: Endoscopy;  Laterality: N/A;  . JOINT REPLACEMENT     left hip  . LEFT ANKLE SURGERY  MARCH 2016  . LUMBAR LAMINECTOMY/DECOMPRESSION MICRODISCECTOMY N/A 06/18/2017   Procedure: LAMINECTOMY AND FORAMINOTOMY LUMBAR FOUR - LUMBAR  FIVE;  Surgeon: Ashok Pall, MD;  Location: [facility];  Service: Neurosurgery;  Laterality: N/A;  LAMINECTOMY AND FORAMINOTOMY LUMBAR 4- LUMBAR 5  . NECK SURGERY  AS TEENAGER   STITCHES TO NECK   . SURGERY FOR CUT ON STOMACH  TEENAGER  . TOTAL HIP ARTHROPLASTY Left 05/14/2016   Procedure: TOTAL HIP ARTHROPLASTY ANTERIOR APPROACH;  Surgeon: Renette Butters, MD;  Location: [facility];  Service: Orthopedics;  Laterality: Left;    OB History    Gravida  1   Para  1   Term  1   Preterm      AB       Living  1     SAB      TAB      Ectopic      Multiple      Live Births               Home Medications    Prior to Admission medications   Medication Sig Start Date End Date Taking? Authorizing Provider  ACETAMINOPHEN PO Take by mouth.    [provider]  albuterol (PROVENTIL HFA;VENTOLIN HFA) 108 (90 BASE) MCG/ACT inhaler Inhale 2 puffs into the lungs every 6 (six) hours as needed for wheezing.    [provider]  albuterol (PROVENTIL) (2.5 MG/3ML) 0.083% nebulizer solution Take 2.5 mg by nebulization every 6 (six) hours as needed for wheezing or shortness of breath.    [provider]  amoxicillin-clavulanate (AUGMENTIN) 875-125 MG tablet Take 1 tablet by mouth every 12 (twelve) hours for 10 days. 05/04/18 05/14/18  Zigmund Gottron, NP  atenolol-chlorthalidone (TENORETIC) 50-25 MG tablet Take 1 tablet by mouth daily.    [provider]  Cholecalciferol (VITAMIN D3) 1000 units CAPS Take 1,000 Units by mouth daily.    [provider]  diclofenac sodium (VOLTAREN) 1 % GEL Apply 4 g topically 4 (four) times daily. 03/04/18   Margarita Mail, PA-C  docusate sodium (COLACE) 100 MG capsule Take 1 capsule (100 mg total) by mouth 2 (two) times daily. Patient taking differently: Take 100 mg by mouth daily.  05/14/16   Renette Butters, MD  Fluticasone-Salmeterol (ADVAIR) 250-50 MCG/DOSE AEPB Inhale 1 puff into the lungs 2 (two) times daily.    [provider]  lithium carbonate (ESKALITH) 450 MG CR tablet Take 450 mg by mouth at bedtime.     [provider]  LITHIUM PO Take by mouth.    [provider]  losartan (COZAAR) 50 MG tablet Take 50 mg by mouth daily.    [provider]  losartan-hydrochlorothiazide (HYZAAR) 100-25 MG tablet Take 1 tablet by mouth daily.    [provider]  mirabegron ER (MYRBETRIQ) 25 MG TB24 tablet Take 25 mg by mouth daily.    [provider]  montelukast  (SINGULAIR) 10 MG tablet Take 10 mg by mouth daily.     [provider]  Multiple Vitamin (MULTIVITAMIN) tablet Take 1 tablet by mouth daily.    [provider]  PARoxetine (PAXIL) 30 MG tablet Take 30 mg by mouth daily.    [provider]  polyethylene glycol powder (GLYCOLAX/MIRALAX) powder Take 17 g by mouth daily. As needed to keep bowel movements soft 04/22/18   Robyn Haber, MD  QUEtiapine (SEROQUEL) 400 MG tablet Take 400 mg by mouth at bedtime.    [provider]  sodium chloride (OCEAN) 0.65 % SOLN nasal spray Place 1 spray into both nostrils 2 (two)  times daily as needed for congestion.    [provider]  triamcinolone (NASACORT) 55 MCG/ACT AERO nasal inhaler Place 2 sprays into the nose daily. 05/04/18   Zigmund Gottron, NP    Family History Family History  Problem Relation Age of Onset  . Hypertension Mother   . Hypertension Maternal Grandmother   . Hypertension Paternal Grandmother   . Diabetes Paternal Grandmother   . Hypertension Paternal Grandfather   . Diabetes Paternal Grandfather   . Alcohol abuse Paternal Aunt   . Mental illness Cousin   . Heart attack Cousin   . Heart attack Maternal Uncle     Social History Social History   Tobacco Use  . Smoking status: Current Every Day Smoker    Packs/day: 0.25    Years: 26.00    Pack years: 6.50    Types: Cigarettes  . Smokeless tobacco: Current User  Substance Use Topics  . Alcohol use: No    Comment: quit date 01/17/2013  . Drug use: No    Types: "Crack" cocaine, Heroin    Comment: quit date 04-17-2014 FOR CRACK     Allergies   Risperidone and related; Buspirone; Crab [shellfish allergy]; Other; Prednisone; and Gabapentin   Review of Systems Review of Systems   Physical Exam Triage Vital Signs ED Triage Vitals  Enc Vitals Group     BP 05/04/18 1704 114/73     Pulse Rate 05/04/18 1704 69     Resp 05/04/18 1704 (!) 22     Temp 05/04/18 1704 98.3 F (36.8  C)     Temp Source 05/04/18 1704 Oral     SpO2 05/04/18 1704 99 %     Weight --      Height --      Head Circumference --      Peak Flow --      Pain Score 05/04/18 1707 8     Pain Loc --      Pain Edu? --      Excl. in [place]? --    Orthostatic VS for the past 24 hrs:  BP- Lying Pulse- Lying BP- Sitting Pulse- Sitting BP- Standing at 0 minutes Pulse- Standing at 0 minutes  05/04/18 1806 127/86 69 133/84 72 148/88 70    Updated Vital Signs BP 114/73 (BP Location: Right Arm)   Pulse 69   Temp 98.3 F (36.8 C) (Oral)   Resp (!) 22   LMP 12/09/2015 Comment: very irregular  SpO2 99%    Physical Exam  Constitutional: She is oriented to person, place, and time. She appears well-developed and well-nourished. No distress.  HENT:  Head: Normocephalic and atraumatic.  Right Ear: External ear and ear canal normal. A middle ear effusion is present.  Left Ear: External ear and ear canal normal. A middle ear effusion is present.  Nose: Mucosal edema and rhinorrhea present. Right sinus exhibits no maxillary sinus tenderness and no frontal sinus tenderness. Left sinus exhibits no maxillary sinus tenderness and no frontal sinus tenderness.  Mouth/Throat: Uvula is midline, oropharynx is clear and moist and mucous membranes are normal. No tonsillar exudate.  Eyes: Pupils are equal, round, and reactive to light. Conjunctivae and EOM are normal.  Cardiovascular: Normal rate, regular rhythm and normal heart sounds.  Pulmonary/Chest: Effort normal and breath sounds normal.  Neurological: She is alert and oriented to person, place, and time. She has normal strength. No cranial nerve deficit. Coordination normal. GCS eye subscore is 4. GCS verbal subscore is 5.  GCS motor subscore is 6.  Unsteady with romberg; patient with slight tremor to head she explains is new; during exam and history while talking no tremor noted however   Skin: Skin is warm and dry.     UC Treatments / Results  Labs (all labs  ordered are listed, but only abnormal results are displayed) Labs Reviewed  POCT I-STAT, CHEM 8 - Abnormal; Notable for the following components:      Result Value   Potassium 3.3 (*)    Chloride 92 (*)    Glucose, Bld 118 (*)    All other components within normal limits    EKG None  Radiology No results found.  Procedures Procedures (including critical care time)  Medications Ordered in UC Medications - No data to display  Initial Impression / Assessment and Plan / UC Course  I have reviewed the triage vital signs and the nursing notes.  Pertinent labs & imaging results that were available during my care of the patient were reviewed by me and considered in my medical decision making (see chart for details).     Non toxic in appearance. Ambulatory without difficulty. Symptoms are very distractible. Without neurological findings on exam. Vitals stable. ekg and chem 8 reassuring, she is on oral potassium replacement. Will treat for sinusitis at this time, switch nasal spray to see if more effective. Encouraged follow up in the next week with PCP. Return precautions provided. Patient verbalized understanding and agreeable to plan.  Ambulatory out of clinic without difficulty.    Final Clinical Impressions(s) / UC Diagnoses   Final diagnoses:  Dizziness  Acute sinusitis, recurrence not specified, unspecified location     Discharge Instructions     Push fluids to ensure adequate hydration and keep secretions thin.  Please continue with medications as prescribed including your Potassium, increase potassium in your diet.  Stop the daily flonase and start nasacort, we will see if this better helps with sinus symptoms.  Antibiotics to help with sinus congestion.  Please follow up with your primary care provider for recheck of symptoms in the next week.  If worsening, develop headache, chest pain , shortness of breath , loss of consciousness or otherwise worsening please go to the  Er.     ED Prescriptions    Medication Sig Dispense Auth. Provider   amoxicillin-clavulanate (AUGMENTIN) 875-125 MG tablet Take 1 tablet by mouth every 12 (twelve) hours for 10 days. 20 tablet Augusto Gamble B, NP   triamcinolone (NASACORT) 55 MCG/ACT AERO nasal inhaler Place 2 sprays into the nose daily. 1 Inhaler Zigmund Gottron, NP     Controlled Substance Prescriptions [place] Controlled Substance Registry consulted? Not Applicable   Zigmund Gottron, NP 05/04/18 1907

## 2018-05-04 NOTE — Discharge Instructions (Addendum)
Push fluids to ensure adequate hydration and keep secretions thin.  Please continue with medications as prescribed including your Potassium, increase potassium in your diet.  Stop the daily flonase and start nasacort, we will see if this better helps with sinus symptoms.  Antibiotics to help with sinus congestion.  Please follow up with your primary care provider for recheck of symptoms in the next week.  If worsening, develop headache, chest pain , shortness of breath , loss of consciousness or otherwise worsening please go to the Er.

## 2018-05-04 NOTE — ED Triage Notes (Signed)
C/o dizziness onset last  C/o nausea.

## 2018-06-04 ENCOUNTER — Emergency Department (HOSPITAL_COMMUNITY)
Admission: EM | Admit: 2018-06-04 | Discharge: 2018-06-04 | Disposition: A | Discharge status: Home / Self Care | Payer: Medicaid Other | Attending: Emergency Medicine | Admitting: Emergency Medicine

## 2018-06-04 ENCOUNTER — Encounter (HOSPITAL_COMMUNITY): Payer: Self-pay | Admitting: Emergency Medicine

## 2018-06-04 DIAGNOSIS — F17210 Nicotine dependence, cigarettes, uncomplicated: Secondary | ICD-10-CM | POA: Diagnosis not present

## 2018-06-04 DIAGNOSIS — Y998 Other external cause status: Secondary | ICD-10-CM | POA: Insufficient documentation

## 2018-06-04 DIAGNOSIS — S60861A Insect bite (nonvenomous) of right wrist, initial encounter: Secondary | ICD-10-CM | POA: Diagnosis not present

## 2018-06-04 DIAGNOSIS — J449 Chronic obstructive pulmonary disease, unspecified: Secondary | ICD-10-CM | POA: Insufficient documentation

## 2018-06-04 DIAGNOSIS — S90561A Insect bite (nonvenomous), right ankle, initial encounter: Secondary | ICD-10-CM | POA: Insufficient documentation

## 2018-06-04 DIAGNOSIS — W57XXXA Bitten or stung by nonvenomous insect and other nonvenomous arthropods, initial encounter: Secondary | ICD-10-CM | POA: Insufficient documentation

## 2018-06-04 DIAGNOSIS — S60862A Insect bite (nonvenomous) of left wrist, initial encounter: Secondary | ICD-10-CM | POA: Diagnosis not present

## 2018-06-04 DIAGNOSIS — S90562A Insect bite (nonvenomous), left ankle, initial encounter: Secondary | ICD-10-CM | POA: Insufficient documentation

## 2018-06-04 DIAGNOSIS — Z5321 Procedure and treatment not carried out due to patient leaving prior to being seen by health care provider: Secondary | ICD-10-CM | POA: Diagnosis not present

## 2018-06-04 DIAGNOSIS — R7303 Prediabetes: Secondary | ICD-10-CM | POA: Diagnosis not present

## 2018-06-04 DIAGNOSIS — I10 Essential (primary) hypertension: Secondary | ICD-10-CM | POA: Diagnosis not present

## 2018-06-04 DIAGNOSIS — W07XXXA Fall from chair, initial encounter: Secondary | ICD-10-CM | POA: Insufficient documentation

## 2018-06-04 DIAGNOSIS — M545 Low back pain, unspecified: Secondary | ICD-10-CM

## 2018-06-04 DIAGNOSIS — Y9389 Activity, other specified: Secondary | ICD-10-CM | POA: Diagnosis not present

## 2018-06-04 DIAGNOSIS — K5900 Constipation, unspecified: Secondary | ICD-10-CM | POA: Insufficient documentation

## 2018-06-04 DIAGNOSIS — Y929 Unspecified place or not applicable: Secondary | ICD-10-CM | POA: Diagnosis not present

## 2018-06-04 LAB — URINALYSIS, ROUTINE W REFLEX MICROSCOPIC
Bilirubin Urine: NEGATIVE
GLUCOSE, UA: NEGATIVE mg/dL
HGB URINE DIPSTICK: NEGATIVE
Ketones, ur: NEGATIVE mg/dL
Leukocytes, UA: NEGATIVE
Nitrite: NEGATIVE
PH: 5 (ref 5.0–8.0)
PROTEIN: NEGATIVE mg/dL
Specific Gravity, Urine: 1.009 (ref 1.005–1.030)

## 2018-06-04 MED ORDER — LORATADINE 10 MG PO TABS: 10.0000 mg | ORAL_TABLET | Freq: Every day | ORAL | 0 refills | Status: DC

## 2018-06-04 MED ORDER — KETOROLAC TROMETHAMINE 30 MG/ML IJ SOLN
30.0000 mg | Freq: Once | INTRAMUSCULAR | Status: AC
  Administered 2018-06-04: 30 mg via INTRAMUSCULAR
  Filled 2018-06-04: qty 1

## 2018-06-04 NOTE — ED Notes (Signed)
ALL notes and triage charted in error by this RN

## 2018-06-04 NOTE — Discharge Instructions (Addendum)
Please read attached information. If you experience any new or worsening signs or symptoms please return to the emergency room for evaluation. Please follow-up with your primary care provider or specialist as discussed. Please use medication prescribed only as directed and discontinue taking if you have any concerning signs or symptoms.   °

## 2018-06-04 NOTE — ED Triage Notes (Signed)
Pt arrives via EMS. Pt started feeling dizzy while trying to have BM. Pt complains of epigastric abd pain and ongoing constipation. Denies CP/SOB. BP 80/50 initially, laying it was 100/64. EMS gave 450mL NS. Pt alert and oriented. CBG 128.

## 2018-06-04 NOTE — ED Triage Notes (Signed)
Pt states she tripped and fell today in her kitchen, hit her lower back on the refrigerator. Pt states she has been also itching on lower back, hands and feet. Pt complains of frequent urination and would like to be checked for UTI.

## 2018-06-04 NOTE — ED Provider Notes (Signed)
[facility] EMERGENCY DEPARTMENT Provider Note   CSN: [id number] Arrival date & time: 06/04/18  1754     History   Chief Complaint Chief Complaint  Patient presents with  . Back Pain    HPI Alicia Blake is a 52 y.o. female.  HPI   52 year old female presents stating with complaints of left lower back pain. Patient notes history of chronic low back pain status post laminectomy. She notes that 3 days ago she slipped missing a chair falling against the refrigerator and sliding down to the floor. She notes this exacerbated her pain. She denies any loss of distal sensation strength or motor function. Denies any other injuries. Patient also notes that over the last several weeks she's had itching at her wrists and ankles with bug bites. She denies any known abnormal exposures, new sleeping arrangements. She does note that she is attending a group session that was known to have bedbugs on the chairs.  Past Medical History:  Diagnosis Date  . Asthma   . Bipolar affect, depressed ([place])   . Bronchitis   . Chronic back pain   . COPD (chronic obstructive pulmonary disease) ([place])   . DJD (degenerative joint disease)    BACK  . GERD (gastroesophageal reflux disease)   . Hypertension   . Pneumonia   . Pre-diabetes    dr entered diabetes without complications- no meds does not test sugar at home  . PTSD (post-traumatic stress disorder)   . Scoliosis   . Seizures ([place])    "when drinking" last yrs ago  . Shortness of breath dyspnea    exersion  . Thyroid disease   . UTI (lower urinary tract infection)     Patient Active Problem List   Diagnosis Date Noted  . Viral respiratory illness 08/31/2017  . Cervicitis 08/31/2017  . Acute vaginitis 08/31/2017  . Lumbar stenosis with neurogenic claudication 06/18/2017  . Tobacco abuse 04/19/2016  . Primary osteoarthritis of left hip 04/18/2016  . Opioid use disorder, mild, abuse ([place]) 09/08/2015  . Hypokalemia 09/07/2015    . Schizoaffective disorder, bipolar type ([place]) 09/07/2015  . Chronic pain syndrome 09/07/2015  . Alcohol use disorder, moderate, in sustained remission ([place]) 09/07/2015  . Cocaine use disorder, moderate, in sustained remission ([place]) 09/07/2015  . Cannabis use disorder, moderate, in sustained remission ([place]) 09/07/2015  . DDD (degenerative disc disease), lumbar 09/07/2015  . Hx of fracture of ankle 09/07/2015  . Symptomatic Fibroids 08/09/2013  . Breast lump on left side at 9 o'clock position 06/29/2013  . Breast discharge 06/29/2013    Past Surgical History:  Procedure Laterality Date  . 2 LEFT TOES SURGERY  12/2014   finished only on right side  . BACK SURGERY    . BALLOON DILATION N/A 12/14/2015   Procedure: BALLOON DILATION;  Surgeon: Teena Irani, MD;  [facility];  Service: Endoscopy;  Laterality: N/A;  . COLONOSCOPY WITH PROPOFOL N/A 12/14/2015   Procedure: COLONOSCOPY WITH PROPOFOL;  Surgeon: Teena Irani, MD;  [facility];  Service: Endoscopy;  Laterality: N/A;  . ESOPHAGOGASTRODUODENOSCOPY (EGD) WITH PROPOFOL N/A 12/14/2015   Procedure: ESOPHAGOGASTRODUODENOSCOPY (EGD) WITH PROPOFOL;  Surgeon: Teena Irani, MD;  [facility];  Service: Endoscopy;  Laterality: N/A;  . JOINT REPLACEMENT     left hip  . LEFT ANKLE SURGERY  MARCH 2016  . LUMBAR LAMINECTOMY/DECOMPRESSION MICRODISCECTOMY N/A 06/18/2017   Procedure: LAMINECTOMY AND FORAMINOTOMY LUMBAR FOUR - LUMBAR FIVE;  Surgeon: Ashok Pall, MD;  Location: [facility];  Service: Neurosurgery;  Laterality: N/A;  LAMINECTOMY AND FORAMINOTOMY LUMBAR 4- LUMBAR 5  . NECK SURGERY  AS TEENAGER   STITCHES TO NECK   . SURGERY FOR CUT ON STOMACH  TEENAGER  . TOTAL HIP ARTHROPLASTY Left 05/14/2016   Procedure: TOTAL HIP ARTHROPLASTY ANTERIOR APPROACH;  Surgeon: Renette Butters, MD;  Location: [facility];  Service: Orthopedics;  Laterality: Left;     OB History    Gravida  1   Para  1   Term  1   Preterm      AB       Living  1     SAB      TAB      Ectopic      Multiple      Live Births               Home Medications    Prior to Admission medications   Medication Sig Start Date End Date Taking? Authorizing Provider  ACETAMINOPHEN PO Take by mouth.    [provider]  albuterol (PROVENTIL HFA;VENTOLIN HFA) 108 (90 BASE) MCG/ACT inhaler Inhale 2 puffs into the lungs every 6 (six) hours as needed for wheezing.    [provider]  albuterol (PROVENTIL) (2.5 MG/3ML) 0.083% nebulizer solution Take 2.5 mg by nebulization every 6 (six) hours as needed for wheezing or shortness of breath.    [provider]  atenolol-chlorthalidone (TENORETIC) 50-25 MG tablet Take 1 tablet by mouth daily.    [provider]  Cholecalciferol (VITAMIN D3) 1000 units CAPS Take 1,000 Units by mouth daily.    [provider]  diclofenac sodium (VOLTAREN) 1 % GEL Apply 4 g topically 4 (four) times daily. 03/04/18   Margarita Mail, PA-C  docusate sodium (COLACE) 100 MG capsule Take 1 capsule (100 mg total) by mouth 2 (two) times daily. Patient taking differently: Take 100 mg by mouth daily.  05/14/16   Renette Butters, MD  Fluticasone-Salmeterol (ADVAIR) 250-50 MCG/DOSE AEPB Inhale 1 puff into the lungs 2 (two) times daily.    [provider]  lithium carbonate (ESKALITH) 450 MG CR tablet Take 450 mg by mouth at bedtime.     [provider]  LITHIUM PO Take by mouth.    [provider]  loratadine (CLARITIN) 10 MG tablet Take 1 tablet (10 mg total) by mouth daily. 06/04/18   Henretta Quist, Dellis Filbert, PA-C  losartan (COZAAR) 50 MG tablet Take 50 mg by mouth daily.    [provider]  losartan-hydrochlorothiazide (HYZAAR) 100-25 MG tablet Take 1 tablet by mouth daily.    [provider]  mirabegron ER (MYRBETRIQ) 25 MG TB24 tablet Take 25 mg by mouth daily.    [provider]  montelukast (SINGULAIR) 10 MG tablet Take 10 mg by mouth  daily.     [provider]  Multiple Vitamin (MULTIVITAMIN) tablet Take 1 tablet by mouth daily.    [provider]  PARoxetine (PAXIL) 30 MG tablet Take 30 mg by mouth daily.    [provider]  polyethylene glycol powder (GLYCOLAX/MIRALAX) powder Take 17 g by mouth daily. As needed to keep bowel movements soft 04/22/18   Robyn Haber, MD  QUEtiapine (SEROQUEL) 400 MG tablet Take 400 mg by mouth at bedtime.    [provider]  sodium chloride (OCEAN) 0.65 % SOLN nasal spray Place 1 spray into both nostrils 2 (two) times daily as needed for congestion.    [provider]  triamcinolone (NASACORT) 55 MCG/ACT AERO nasal inhaler Place 2 sprays into the nose daily. 05/04/18   Zigmund Gottron, NP    Family History Family History  Problem Relation Age of Onset  . Hypertension Mother   . Hypertension Maternal Grandmother   . Hypertension Paternal Grandmother   . Diabetes Paternal Grandmother   . Hypertension Paternal Grandfather   . Diabetes Paternal Grandfather   . Alcohol abuse Paternal Aunt   . Mental illness Cousin   . Heart attack Cousin   . Heart attack Maternal Uncle     Social History Social History   Tobacco Use  . Smoking status: Current Every Day Smoker    Packs/day: 0.25    Years: 26.00    Pack years: 6.50    Types: Cigarettes  . Smokeless tobacco: Current User  Substance Use Topics  . Alcohol use: No    Comment: quit date 01/17/2013  . Drug use: No    Types: "Crack" cocaine, Heroin    Comment: quit date 04-17-2014 FOR CRACK     Allergies   Risperidone and related; Buspirone; Crab [shellfish allergy]; Other; Prednisone; and Gabapentin   Review of Systems Review of Systems  All other systems reviewed and are negative.  Physical Exam Updated Vital Signs BP 134/69 (BP Location: Right Arm)   Pulse 70   Temp 97.7 F (36.5 C) (Oral)   Resp 16   Ht 5\' 6"  (1.676 m)   Wt 115.2 kg   LMP 12/09/2015 Comment: very  irregular  SpO2 100%   BMI 41.00 kg/m   Physical Exam  Constitutional: She is oriented to person, place, and time. She appears well-developed and well-nourished.  HENT:  Head: Normocephalic and atraumatic.  Eyes: Pupils are equal, round, and reactive to light. Conjunctivae are normal. Right eye exhibits no discharge. Left eye exhibits no discharge. No scleral icterus.  Neck: Normal range of motion. No JVD present. No tracheal deviation present.  Pulmonary/Chest: Effort normal. No stridor.  Musculoskeletal:  Minor tenderness palpation left lateral lumbar musculature, no CT or L-spine tenderness- bilateral  lower extremity sensation strength or motor function intact  Neurological: She is alert and oriented to person, place, and time. Coordination normal.  Skin:  Areas of excoriation and papules noted to the bilateral ankles and wrists  Psychiatric: She has a normal mood and affect. Her behavior is normal. Judgment and thought content normal.  Nursing note and vitals reviewed.    ED Treatments / Results  Labs (all labs ordered are listed, but only abnormal results are displayed) Labs Reviewed  URINALYSIS, ROUTINE W REFLEX MICROSCOPIC    EKG None  Radiology No results found.  Procedures Procedures (including critical care time)  Medications Ordered in ED Medications  ketorolac (TORADOL) 30 MG/ML injection 30 mg (has no administration in time range)     Initial Impression / Assessment and Plan / ED Course  I have reviewed the triage vital signs and the nursing notes.  Pertinent labs & imaging results that were available during my care of the patient were reviewed by me and considered in my medical decision making (see chart for details).     Labs: UA  Imaging:  Consults:  Therapeutics:  Discharge Meds:   Assessment/Plan: 52 year old female presents today with complaints of back pain. She notes this is an exacerbation of her chronic back pain, she would not like  any imaging or further evaluation. She given Toradol here. Patient also has what appears to be insect bites on her  wrist and ankles, no signs of secondary infection. Patient encouraged to take Claritin or Benadryl as needed, check her home for insects including bedbugs.    Final Clinical Impressions(s) / ED Diagnoses   Final diagnoses:  Left-sided low back pain without sciatica, unspecified chronicity  Bug bite, initial encounter    ED Discharge Orders         Ordered    loratadine (CLARITIN) 10 MG tablet  Daily     06/04/18 1916           Okey Regal, PA-C 06/04/18 Marty, Dan, DO 06/04/18 2037

## 2018-07-06 IMAGING — CT CT HEAD W/O CM
2 series · 15 of 37 positions shown, 18 images · non-contrast
Comparison: None.

CLINICAL DATA: Increased confusion and altered mental status.

EXAM:
CT HEAD WITHOUT CONTRAST
TECHNIQUE: Contiguous axial images were obtained from the base of the skull
through the vertex without intravenous contrast.

[Series 3: head 5.0 h30s · axial · 0.44mm/px · z∈[+26,+156]mm · 12 of 32 slices shown, 15 images]
[im 3/32  brain]
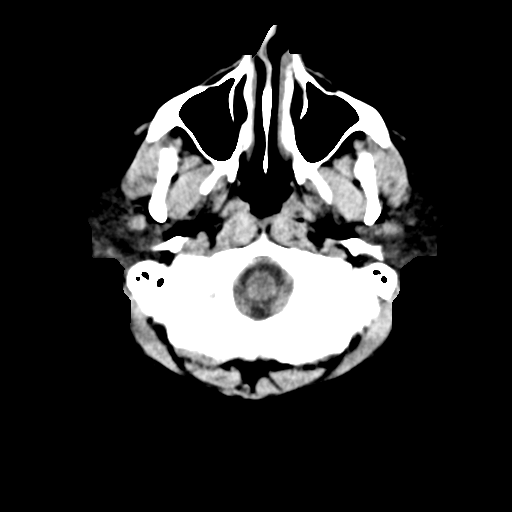
[im 3/32  bone]
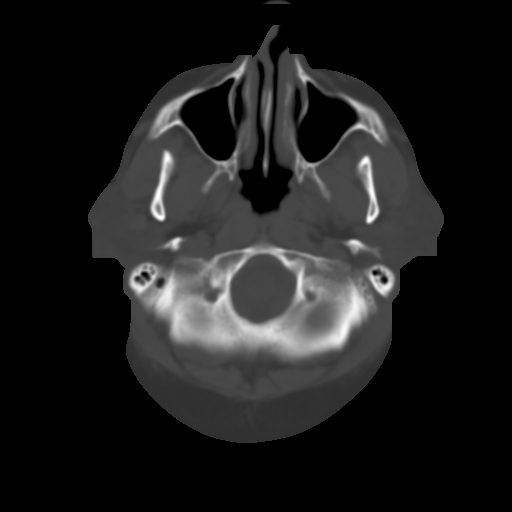
[im 5/32  brain]
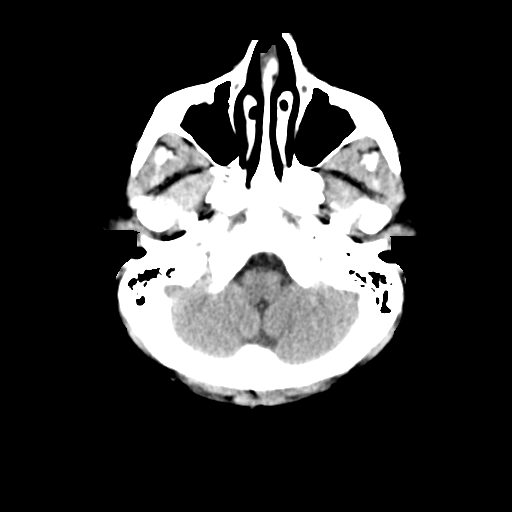
[im 7/32  brain]
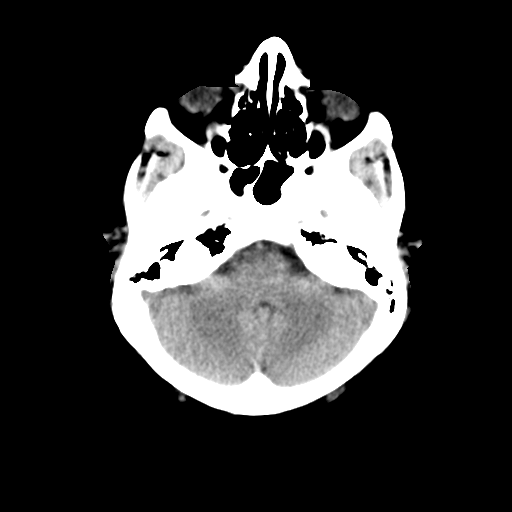
[im 10/32  brain]
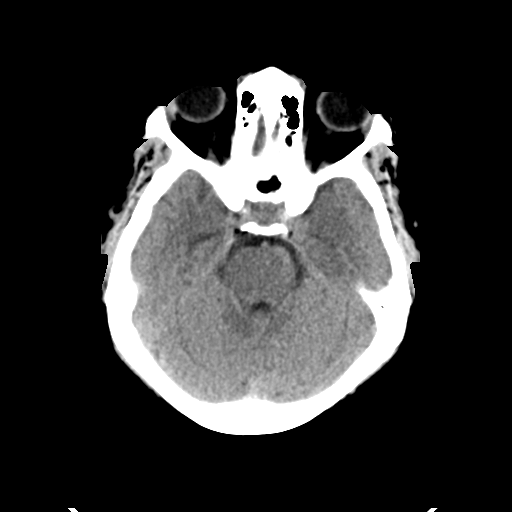
[im 12/32  brain]
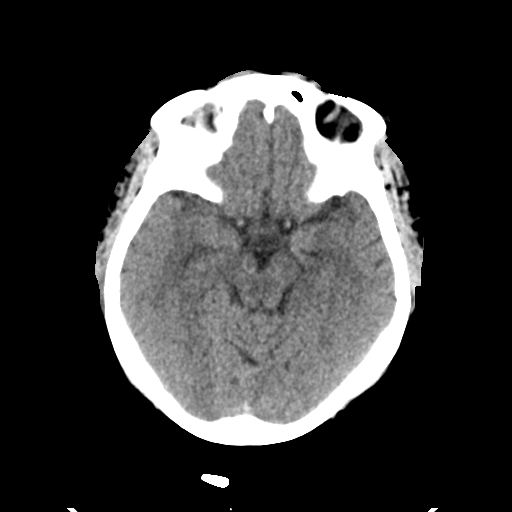
[im 12/32  bone]
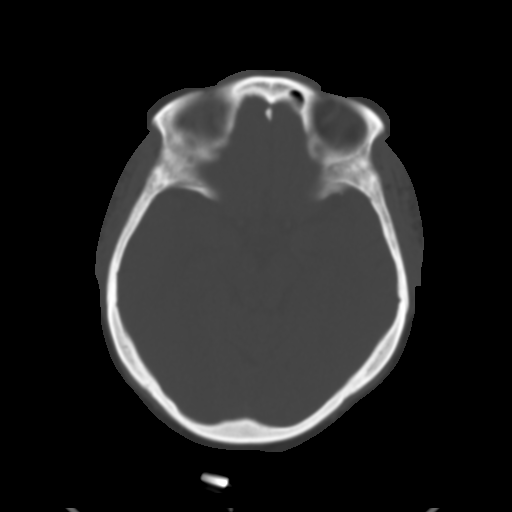
[im 14/32  brain]
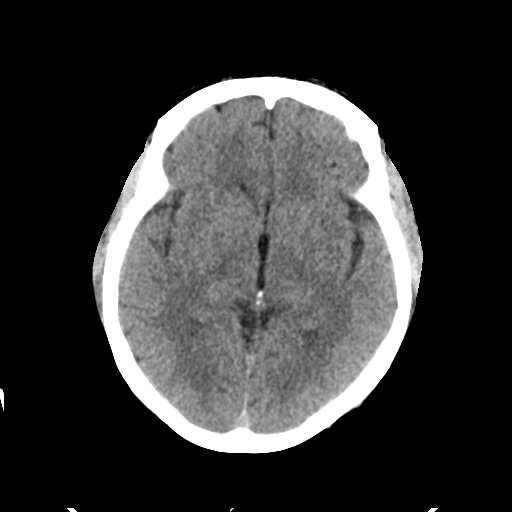
[im 18/32  brain]
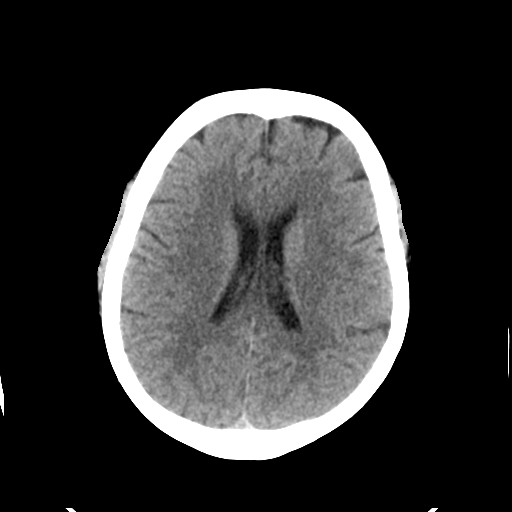
[im 20/32  brain]
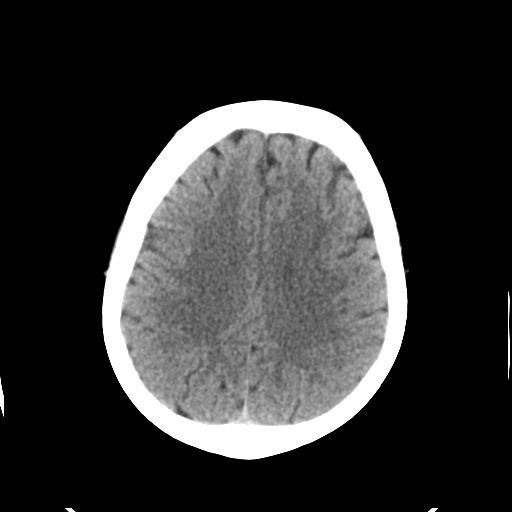
[im 22/32  brain]
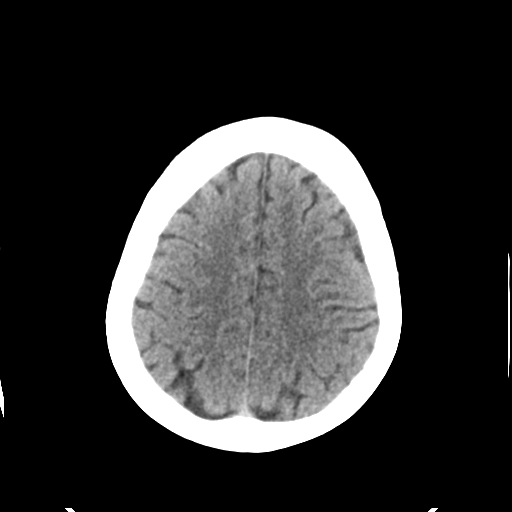
[im 22/32  bone]
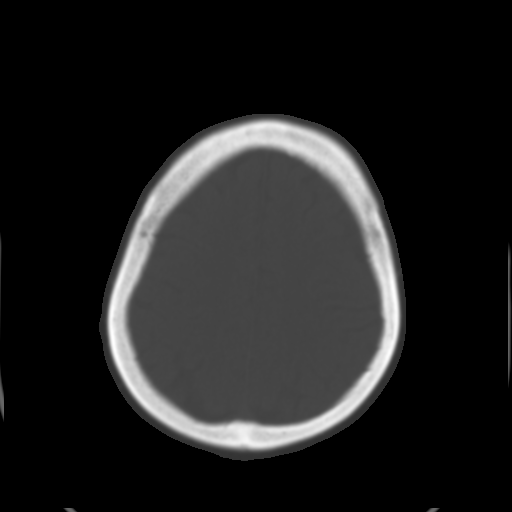
[im 25/32  brain]
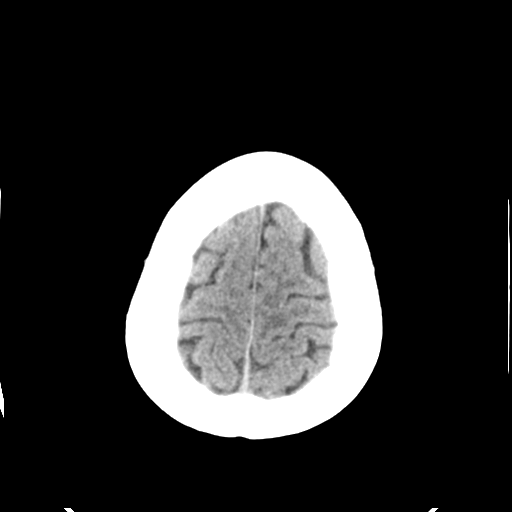
[im 27/32  brain]
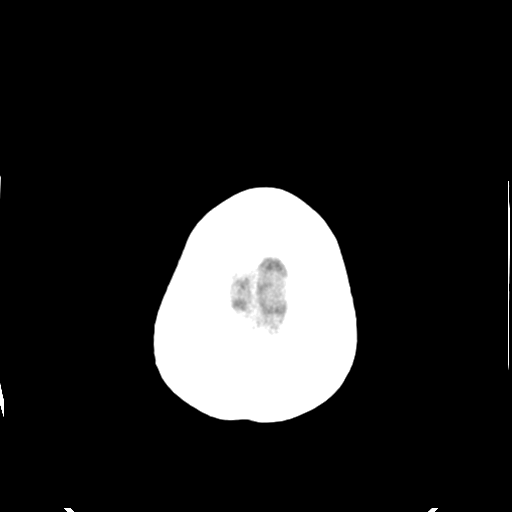
[im 29/32  brain]
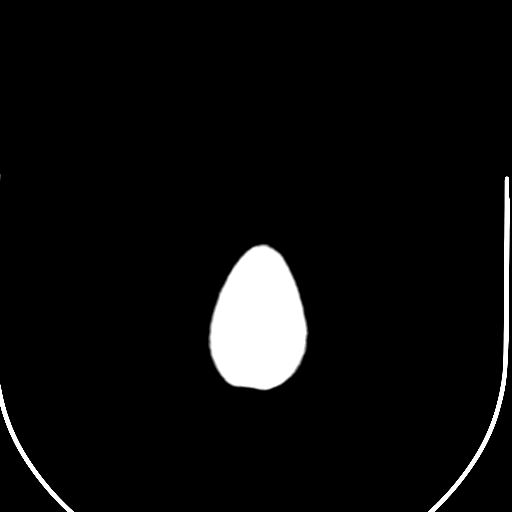

[Series 6: head 3.0 mpr sag · sagittal · 0.31mm/px · 3 of 59 slices shown]
[im 20/59  brain]
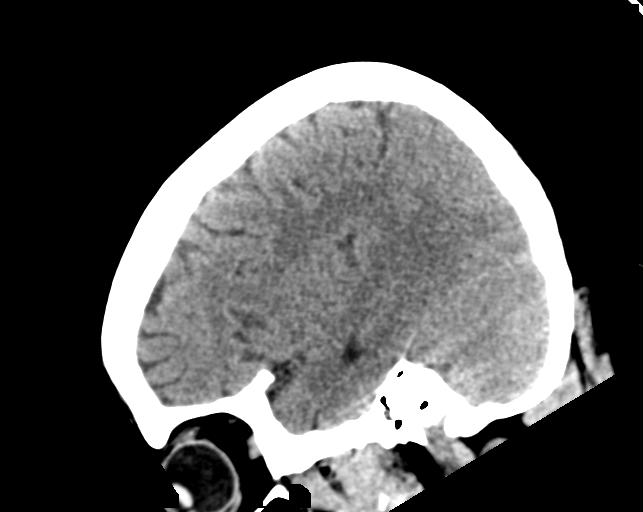
[im 30/59  brain]
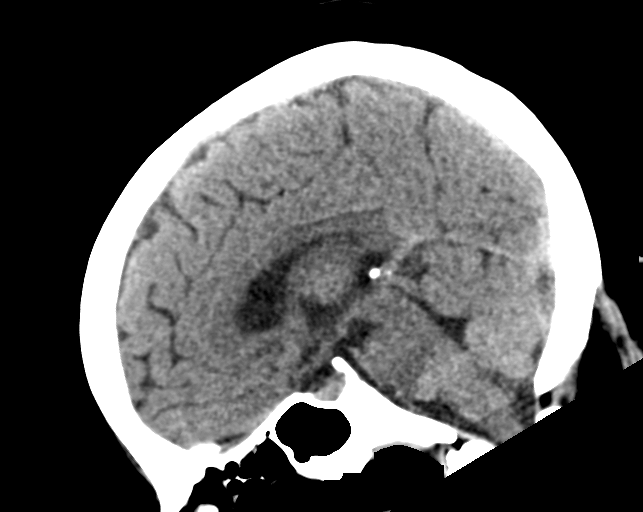
[im 39/59  brain]
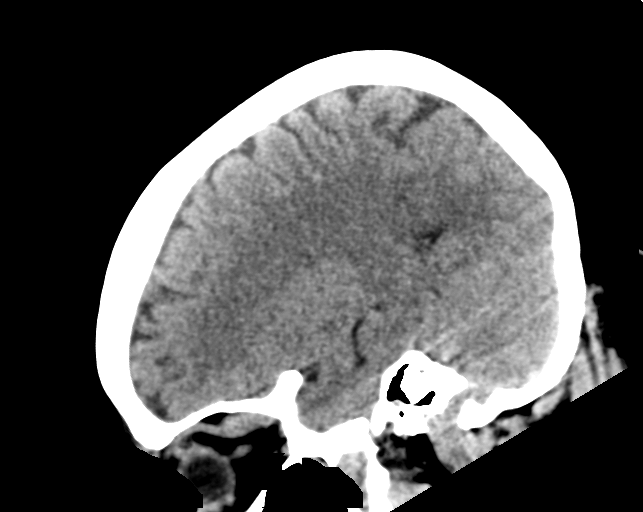

[15 of 37 positions shown; findings below may reference images not displayed]

FINDINGS: Brain: No evidence of acute infarction, hemorrhage, hydrocephalus,
extra-axial collection or mass lesion/mass effect.

Vascular: No hyperdense vessel or unexpected calcification.

Skull: Normal. Negative for fracture or focal lesion.

Sinuses/Orbits: No acute finding.

Other: None.
IMPRESSION: No acute intracranial abnormality.

## 2018-08-11 DIAGNOSIS — N62 Hypertrophy of breast: Secondary | ICD-10-CM | POA: Insufficient documentation

## 2018-08-25 ENCOUNTER — Ambulatory Visit: Payer: Medicaid Other | Admitting: Physical Therapy

## 2018-09-09 ENCOUNTER — Other Ambulatory Visit: Payer: Self-pay

## 2018-09-09 ENCOUNTER — Emergency Department (HOSPITAL_COMMUNITY)
Admission: EM | Admit: 2018-09-09 | Discharge: 2018-09-10 | Disposition: A | Discharge status: Home / Self Care | Payer: Medicaid Other | Attending: Emergency Medicine | Admitting: Emergency Medicine

## 2018-09-09 ENCOUNTER — Emergency Department (HOSPITAL_COMMUNITY): Payer: Medicaid Other

## 2018-09-09 DIAGNOSIS — F17210 Nicotine dependence, cigarettes, uncomplicated: Secondary | ICD-10-CM | POA: Insufficient documentation

## 2018-09-09 DIAGNOSIS — R0981 Nasal congestion: Secondary | ICD-10-CM | POA: Diagnosis not present

## 2018-09-09 DIAGNOSIS — R05 Cough: Secondary | ICD-10-CM | POA: Insufficient documentation

## 2018-09-09 DIAGNOSIS — Z79899 Other long term (current) drug therapy: Secondary | ICD-10-CM | POA: Diagnosis not present

## 2018-09-09 DIAGNOSIS — E079 Disorder of thyroid, unspecified: Secondary | ICD-10-CM | POA: Diagnosis not present

## 2018-09-09 DIAGNOSIS — I10 Essential (primary) hypertension: Secondary | ICD-10-CM | POA: Insufficient documentation

## 2018-09-09 DIAGNOSIS — R0602 Shortness of breath: Secondary | ICD-10-CM | POA: Diagnosis present

## 2018-09-09 DIAGNOSIS — J441 Chronic obstructive pulmonary disease with (acute) exacerbation: Secondary | ICD-10-CM | POA: Insufficient documentation

## 2018-09-09 DIAGNOSIS — F319 Bipolar disorder, unspecified: Secondary | ICD-10-CM | POA: Insufficient documentation

## 2018-09-09 MED ORDER — ALBUTEROL SULFATE (2.5 MG/3ML) 0.083% IN NEBU
5.0000 mg | INHALATION_SOLUTION | Freq: Once | RESPIRATORY_TRACT | Status: AC
  Administered 2018-09-09: 5 mg via RESPIRATORY_TRACT
  Filled 2018-09-09: qty 6

## 2018-09-09 NOTE — ED Notes (Signed)
Bed: WA03 Expected date:  Expected time:  Means of arrival:  Comments: 

## 2018-09-09 NOTE — ED Triage Notes (Addendum)
Patient complaining of congestion, Sob, cold, sore throat, and coughing up fleam for the last few days. Patient states she has taken azithromycin but it is not working.

## 2018-09-09 NOTE — ED Notes (Signed)
Patient transported to X-ray 

## 2018-09-09 NOTE — ED Notes (Signed)
Pt told pharm tech that "I had unprotected sex and I need a full workup down there"

## 2018-09-10 ENCOUNTER — Other Ambulatory Visit: Payer: Self-pay

## 2018-09-10 LAB — RPR: RPR: NONREACTIVE

## 2018-09-10 LAB — URINALYSIS, ROUTINE W REFLEX MICROSCOPIC
BILIRUBIN URINE: NEGATIVE
Glucose, UA: NEGATIVE mg/dL
HGB URINE DIPSTICK: NEGATIVE
Ketones, ur: NEGATIVE mg/dL
Leukocytes, UA: NEGATIVE
Nitrite: NEGATIVE
PROTEIN: NEGATIVE mg/dL
Specific Gravity, Urine: 1.011 (ref 1.005–1.030)
pH: 6 (ref 5.0–8.0)

## 2018-09-10 LAB — HIV ANTIBODY (ROUTINE TESTING W REFLEX): HIV Screen 4th Generation wRfx: NONREACTIVE

## 2018-09-10 LAB — GROUP A STREP BY PCR: GROUP A STREP BY PCR: NOT DETECTED

## 2018-09-10 MED ORDER — AEROCHAMBER PLUS FLO-VU MISC
1.0000 | Freq: Once | Status: AC
  Administered 2018-09-10: 06:00:00
  Filled 2018-09-10: qty 1

## 2018-09-10 MED ORDER — ALBUTEROL SULFATE (2.5 MG/3ML) 0.083% IN NEBU
INHALATION_SOLUTION | RESPIRATORY_TRACT | Status: AC
  Administered 2018-09-10: 03:00:00
  Filled 2018-09-10: qty 6

## 2018-09-10 MED ORDER — PREDNISONE 20 MG PO TABS: 40.0000 mg | ORAL_TABLET | Freq: Every day | ORAL | 0 refills | Status: DC

## 2018-09-10 MED ORDER — GUAIFENESIN 100 MG/5ML PO SOLN
ORAL | Status: AC
  Filled 2018-09-10: qty 10

## 2018-09-10 MED ORDER — PREDNISONE 20 MG PO TABS: 40.0000 mg | ORAL_TABLET | Freq: Every day | ORAL | 0 refills | Status: AC

## 2018-09-10 MED ORDER — GUAIFENESIN 100 MG/5ML PO SOLN
ORAL | Status: AC
  Administered 2018-09-10: 200 mg
  Filled 2018-09-10: qty 10

## 2018-09-10 MED ORDER — ALBUTEROL SULFATE HFA 108 (90 BASE) MCG/ACT IN AERS
2.0000 | INHALATION_SPRAY | RESPIRATORY_TRACT | Status: DC
  Administered 2018-09-10: 2 via RESPIRATORY_TRACT
  Filled 2018-09-10: qty 6.7

## 2018-09-10 MED ORDER — IPRATROPIUM BROMIDE 0.02 % IN SOLN
RESPIRATORY_TRACT | Status: AC
  Administered 2018-09-10: 03:00:00
  Filled 2018-09-10: qty 2.5

## 2018-09-10 MED ORDER — GUAIFENESIN 100 MG/5ML PO SYRP: 200.0000 mg | ORAL_SOLUTION | ORAL | 0 refills | Status: DC | PRN

## 2018-09-10 NOTE — ED Notes (Signed)
Pt ambulated in hallway.  SpO2 95% while ambulating, consistent with spO2 at rest.

## 2018-09-10 NOTE — ED Provider Notes (Signed)
[facility] DEPT Provider Note   CSN: [id number] Arrival date & time: 09/09/18  2210     History   Chief Complaint Chief Complaint  Patient presents with  . Nasal Congestion  . Weakness  . Sore Throat  . Exposure to STD    HPI Alicia Blake is a 52 y.o. female history of asthma, COPD, GERD, hypertension, PTSD, and thyroid disease who presents to the emergency department with multiple complaints.  Patient reports that she has been having congestion in her chest for the last few weeks.  Reports that she was taking a course of azithromycin and was supposed to start taking amoxicillin that was prescribed by her PCP, but she was unable to afford it.  She reports she came to the ED tonight because she began having worsening shortness of breath.  She denies chest pain.  No treatment for dyspnea prior to arrival.  No modifying factors.  She reports she has been having a productive cough and nasal congestion.  She smokes 1 pack of cigarettes daily.  The patient reports that she is also concerned because she has been involved in a relationship with the same female partner for the last couple of years.  She reports that she feels safe at home and denies any physical abuse, but reports that she is ready to end the relationship.  She reports that previously when they went on a 3-week break and got back together that she acquired trichomonas.  She denies any malodorous vaginal discharge, vaginal pain, dyspareunia at this time.  She is requesting HIV and syphilis testing.  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  She reports that she needs assistance with trying to get some of the belongings out of her house because it is getting too cluttered.  She reports that she is not established with a psychiatrist or counselor.  She denies SI, HI, or auditory or visual hallucinations.  The history is provided by the patient. No language interpreter was used.    Past Medical  History:  Diagnosis Date  . Asthma   . Bipolar affect, depressed ([place])   . Bronchitis   . Chronic back pain   . COPD (chronic obstructive pulmonary disease) ([place])   . DJD (degenerative joint disease)    BACK  . GERD (gastroesophageal reflux disease)   . Hypertension   . Pneumonia   . Pre-diabetes    dr entered diabetes without complications- no meds does not test sugar at home  . PTSD (post-traumatic stress disorder)   . Scoliosis   . Seizures ([place])    "when drinking" last yrs ago  . Shortness of breath dyspnea    exersion  . Thyroid disease   . UTI (lower urinary tract infection)     Patient Active Problem List   Diagnosis Date Noted  . Viral respiratory illness 08/31/2017  . Cervicitis 08/31/2017  . Acute vaginitis 08/31/2017  . Lumbar stenosis with neurogenic claudication 06/18/2017  . Tobacco abuse 04/19/2016  . Primary osteoarthritis of left hip 04/18/2016  . Opioid use disorder, mild, abuse ([place]) 09/08/2015  . Hypokalemia 09/07/2015  . Schizoaffective disorder, bipolar type ([place]) 09/07/2015  . Chronic pain syndrome 09/07/2015  . Alcohol use disorder, moderate, in sustained remission ([place]) 09/07/2015  . Cocaine use disorder, moderate, in sustained remission ([place]) 09/07/2015  . Cannabis use disorder, moderate, in sustained remission ([place]) 09/07/2015  . DDD (degenerative disc disease), lumbar 09/07/2015  . Hx of fracture of ankle 09/07/2015  .  Symptomatic Fibroids 08/09/2013  . Breast lump on left side at 9 o'clock position 06/29/2013  . Breast discharge 06/29/2013    Past Surgical History:  Procedure Laterality Date  . 2 LEFT TOES SURGERY  12/2014   finished only on right side  . BACK SURGERY    . BALLOON DILATION N/A 12/14/2015   Procedure: BALLOON DILATION;  Surgeon: Teena Irani, MD;  Location: [facility];  Service: Endoscopy;  Laterality: N/A;  . COLONOSCOPY WITH PROPOFOL N/A 12/14/2015   Procedure: COLONOSCOPY WITH PROPOFOL;  Surgeon: Teena Irani, MD;   Location: [facility];  Service: Endoscopy;  Laterality: N/A;  . ESOPHAGOGASTRODUODENOSCOPY (EGD) WITH PROPOFOL N/A 12/14/2015   Procedure: ESOPHAGOGASTRODUODENOSCOPY (EGD) WITH PROPOFOL;  Surgeon: Teena Irani, MD;  Location: [facility];  Service: Endoscopy;  Laterality: N/A;  . JOINT REPLACEMENT     left hip  . LEFT ANKLE SURGERY  MARCH 2016  . LUMBAR LAMINECTOMY/DECOMPRESSION MICRODISCECTOMY N/A 06/18/2017   Procedure: LAMINECTOMY AND FORAMINOTOMY LUMBAR FOUR - LUMBAR FIVE;  Surgeon: Ashok Pall, MD;  Location: [facility];  Service: Neurosurgery;  Laterality: N/A;  LAMINECTOMY AND FORAMINOTOMY LUMBAR 4- LUMBAR 5  . NECK SURGERY  AS TEENAGER   STITCHES TO NECK   . SURGERY FOR CUT ON STOMACH  TEENAGER  . TOTAL HIP ARTHROPLASTY Left 05/14/2016   Procedure: TOTAL HIP ARTHROPLASTY ANTERIOR APPROACH;  Surgeon: Renette Butters, MD;  Location: [facility];  Service: Orthopedics;  Laterality: Left;     OB History    Gravida  1   Para  1   Term  1   Preterm      AB      Living  1     SAB      TAB      Ectopic      Multiple      Live Births               Home Medications    Prior to Admission medications   Medication Sig Start Date End Date Taking? Authorizing Provider  albuterol (PROVENTIL HFA;VENTOLIN HFA) 108 (90 BASE) MCG/ACT inhaler Inhale 2 puffs into the lungs every 6 (six) hours as needed for wheezing.   Yes [provider]  albuterol (PROVENTIL) (2.5 MG/3ML) 0.083% nebulizer solution Take 2.5 mg by nebulization every 6 (six) hours as needed for wheezing or shortness of breath.   Yes [provider]  amLODipine (NORVASC) 10 MG tablet Take 10 mg by mouth daily. 09/02/18  Yes [provider]  cetirizine (ZYRTEC) 10 MG tablet Take 10 mg by mouth daily.   Yes [provider]  furosemide (LASIX) 20 MG tablet Take 20 mg by mouth daily as needed. 09/02/18  Yes [provider]  Multiple Vitamins-Minerals (ONE-A-DAY 50 PLUS PO) Take 1  tablet by mouth daily.   Yes [provider]  sodium chloride (OCEAN) 0.65 % SOLN nasal spray Place 1 spray into both nostrils 2 (two) times daily as needed for congestion.   Yes [provider]  atenolol-chlorthalidone (TENORETIC) 50-25 MG tablet Take 1 tablet by mouth daily.    [provider]  Cholecalciferol (VITAMIN D3) 1000 units CAPS Take 1,000 Units by mouth daily.    [provider]  docusate sodium (COLACE) 100 MG capsule Take 1 capsule (100 mg total) by mouth 2 (two) times daily. Patient taking differently: Take 100 mg by mouth daily.  05/14/16   Renette Butters, MD  Fluticasone-Salmeterol (ADVAIR) 250-50 MCG/DOSE AEPB Inhale 1 puff into  the lungs 2 (two) times daily.    [provider]  guaifenesin (ROBITUSSIN) 100 MG/5ML syrup Take 10 mLs (200 mg total) by mouth every 4 (four) hours as needed for cough. 09/10/18   McDonald, Mia A, PA-C  lithium carbonate (ESKALITH) 450 MG CR tablet Take 450 mg by mouth at bedtime.     [provider]  LITHIUM PO Take by mouth.    [provider]  losartan (COZAAR) 50 MG tablet Take 50 mg by mouth daily.    [provider]  losartan-hydrochlorothiazide (HYZAAR) 100-25 MG tablet Take 1 tablet by mouth daily.    [provider]  mirabegron ER (MYRBETRIQ) 25 MG TB24 tablet Take 25 mg by mouth daily.    [provider]  montelukast (SINGULAIR) 10 MG tablet Take 10 mg by mouth daily.     [provider]  Multiple Vitamin (MULTIVITAMIN) tablet Take 1 tablet by mouth daily.    [provider]  PARoxetine (PAXIL) 30 MG tablet Take 30 mg by mouth daily.    [provider]  predniSONE (DELTASONE) 20 MG tablet Take 2 tablets (40 mg total) by mouth daily for 5 days. 09/10/18 09/15/18  McDonald, Mia A, PA-C  QUEtiapine (SEROQUEL) 400 MG tablet Take 400 mg by mouth at bedtime.    [provider]    Family History Family History  Problem  Relation Age of Onset  . Hypertension Mother   . Hypertension Maternal Grandmother   . Hypertension Paternal Grandmother   . Diabetes Paternal Grandmother   . Hypertension Paternal Grandfather   . Diabetes Paternal Grandfather   . Alcohol abuse Paternal Aunt   . Mental illness Cousin   . Heart attack Cousin   . Heart attack Maternal Uncle     Social History Social History   Tobacco Use  . Smoking status: Current Every Day Smoker    Packs/day: 0.25    Years: 26.00    Pack years: 6.50    Types: Cigarettes  . Smokeless tobacco: Current User  Substance Use Topics  . Alcohol use: No    Comment: quit date 01/17/2013  . Drug use: No    Types: "Crack" cocaine, Heroin    Comment: quit date 04-17-2014 FOR CRACK     Allergies   Lithium; Other; Risperidone and related; Buspirone; Shellfish allergy; Meloxicam; Prednisone; and Gabapentin   Review of Systems Review of Systems  Constitutional: Negative for activity change, chills and fever.  HENT: Positive for congestion.   Eyes: Negative for visual disturbance.  Respiratory: Positive for cough and shortness of breath.   Cardiovascular: Negative for chest pain, palpitations and leg swelling.  Gastrointestinal: Negative for abdominal pain, diarrhea, nausea and vomiting.  Genitourinary: Negative for dysuria.  Musculoskeletal: Negative for back pain.  Skin: Negative for rash.  Allergic/Immunologic: Negative for immunocompromised state.  Neurological: Negative for seizures, syncope, speech difficulty, weakness and headaches.  Psychiatric/Behavioral: Negative for confusion.     Physical Exam Updated Vital Signs BP 116/72   Pulse 82   Temp 98.9 F (37.2 C) (Oral)   Resp 20   Ht 5\' 6"  (1.676 m)   Wt 122 kg   LMP 12/09/2015 Comment: very irregular  SpO2 95%   BMI 43.42 kg/m   Physical Exam  Constitutional: No distress.  Well-appearing.  No acute distress.  HENT:  Head: Normocephalic.  Right Ear: Hearing, tympanic  membrane, external ear and ear canal normal.  Left Ear: Hearing, tympanic membrane, external ear and ear canal  normal.  Nose: Mucosal edema present. Right sinus exhibits no maxillary sinus tenderness and no frontal sinus tenderness. Left sinus exhibits no maxillary sinus tenderness and no frontal sinus tenderness.  Mouth/Throat: Uvula is midline, oropharynx is clear and moist and mucous membranes are normal. No tonsillar exudate.  Eyes: Pupils are equal, round, and reactive to light. Conjunctivae and EOM are normal. No scleral icterus.  Neck: Normal range of motion. Neck supple.  Cardiovascular: Normal rate, regular rhythm, normal heart sounds and intact distal pulses. Exam reveals no gallop and no friction rub.  No murmur heard. Pulmonary/Chest: Effort normal. No stridor. No respiratory distress. She has wheezes. She has no rales. She exhibits no tenderness.  Diffuse inspiratory and expiratory wheezes bilaterally.  Abdominal: Soft. She exhibits no distension and no mass. There is no tenderness. There is no rebound and no guarding. No hernia.  Lymphadenopathy:    She has no cervical adenopathy.  Neurological: She is alert.  Skin: Skin is warm. No rash noted.  Psychiatric: Her behavior is normal.  Nursing note and vitals reviewed.    ED Treatments / Results  Labs (all labs ordered are listed, but only abnormal results are displayed) Labs Reviewed  GROUP A STREP BY PCR  URINALYSIS, ROUTINE W REFLEX MICROSCOPIC  HIV ANTIBODY (ROUTINE TESTING W REFLEX)  RPR    EKG EKG Interpretation  Date/Time:  Thursday September 10 2018 00:10:06 EST Ventricular Rate:  88 PR Interval:    QRS Duration: 84 QT Interval:  361 QTC Calculation: 437 R Axis:   85 Text Interpretation:  Sinus rhythm Normal ECG No significant change was found Confirmed by Shanon Rosser [phone]) on 09/10/2018 12:12:52 AM Also confirmed by Shanon Rosser [phone]), editor Oswaldo Milian, [place] ([id number])  on 09/10/2018 7:03:56  AM   Radiology Dg Chest 2 View  Result Date: 09/09/2018 CLINICAL DATA:  Shortness of breath EXAM: CHEST - 2 VIEW COMPARISON:  None. FINDINGS: The heart size and mediastinal contours are within normal limits. Both lungs are clear. The visualized skeletal structures are unremarkable. IMPRESSION: No active cardiopulmonary disease. Electronically Signed   By: Ulyses Jarred M.D.   On: 09/09/2018 22:37    Procedures Procedures (including critical care time)  Medications Ordered in ED Medications  guaiFENesin (ROBITUSSIN) 100 MG/5ML solution (  Not Given 09/10/18 0411)  albuterol (PROVENTIL HFA;VENTOLIN HFA) 108 (90 Base) MCG/ACT inhaler 2 puff (2 puffs Inhalation Given 09/10/18 0554)  albuterol (PROVENTIL) (2.5 MG/3ML) 0.083% nebulizer solution 5 mg (5 mg Nebulization Given 09/09/18 2236)  albuterol (PROVENTIL) (2.5 MG/3ML) 0.083% nebulizer solution (  Given 09/10/18 0327)  ipratropium (ATROVENT) 0.02 % nebulizer solution (  Given 09/10/18 0327)  guaiFENesin (ROBITUSSIN) 100 MG/5ML solution (200 mg  Given 09/10/18 0408)  aerochamber plus with mask device 1 each ( Other Given 09/10/18 0555)     Initial Impression / Assessment and Plan / ED Course  I have reviewed the triage vital signs and the nursing notes.  Pertinent labs & imaging results that were available during my care of the patient were reviewed by me and considered in my medical decision making (see chart for details).     52 year old female with a hiistory of asthma, COPD, GERD, hypertension, PTSD, and thyroid disease senting with dyspnea, productive cough, and URI symptoms for the last few weeks.  She is also requesting syphilis and HIV testing after having sex with a new female partner without using protection.  She was also requesting a full pelvic work-up, but is asymptomatic at this time.  Recommended follow-up with PCP if she becomes symptomatic.  On exam, the patient has diffuse inspiratory and expiratory wheezes  bilaterally.  She was previously given 1 nebulizer treatment by nursing staff.  Chest x-ray is negative.  EKG unchanged from previous.  Strep PCR ordered by triage staff is negative.  UA is also unremarkable.  Suspect the patient is having a mild COPD exacerbation.  No evidence of pneumonia and antibiotics are not indicated at this time.  Discussed with the patient that I will treat her with a burst of steroids and discharge her home with an albuterol inhaler and a spacer.  Recommended guaifenesin as a mucus expectorant.  She is also aware that RPR and HIV antibody test are pending.  Strict return precautions given.  She was ambulated in the department following a second nebulizer treatment with SaO2 in the high 90s.  She remains in no acute distress.  She is safe for discharge home with outpatient follow-up at this time.  Final Clinical Impressions(s) / ED Diagnoses   Final diagnoses:  Chronic obstructive pulmonary disease with acute exacerbation [facility])    ED Discharge Orders         Ordered    predniSONE (DELTASONE) 20 MG tablet  Daily,   Status:  Discontinued     09/10/18 0434    guaifenesin (ROBITUSSIN) 100 MG/5ML syrup  Every 4 hours PRN     09/10/18 0434    predniSONE (DELTASONE) 20 MG tablet  Daily     09/10/18 0435           Joanne Gavel, PA-C 09/10/18 0755    Shanon Rosser, MD 09/10/18 2243

## 2018-09-10 NOTE — ED Notes (Signed)
Pt states understanding of DC instructions, readiness for DC.  Pt ambulating well at DC to go home with her mother.

## 2018-09-10 NOTE — Discharge Instructions (Addendum)
Thank you for allowing me to care for you today in the Emergency Department.   Take 2 puffs of the albuterol inhaler with a spacer you were given in the emergency department every 4 hours as needed for shortness of breath or wheezing.  Take 2 tablets of prednisone daily for the next 5 days.  Take 10 mL's of guaifenesin every 4 hours as needed for cough and to break up your congestion.  Follow-up with your primary care provider for recheck in 3 to 4 days.  Take thousand milligrams of Tylenol every 8 hours as needed for headache or pain.  Your urine today was unremarkable.  Your HIV and syphilis test are pending.  If positive, someone from the hospital will call you at the number you gave registration with the results.  Return to the emergency department if you develop severe shortness of breath, chest pain with exertion, high fever, shortness of breath that does not improve after using the albuterol inhaler, or other new, concerning symptoms.

## 2018-11-12 ENCOUNTER — Encounter (INDEPENDENT_AMBULATORY_CARE_PROVIDER_SITE_OTHER): Payer: Self-pay

## 2018-11-18 ENCOUNTER — Ambulatory Visit (INDEPENDENT_AMBULATORY_CARE_PROVIDER_SITE_OTHER): Payer: Self-pay | Admitting: Family Medicine

## 2019-03-04 DIAGNOSIS — K148 Other diseases of tongue: Secondary | ICD-10-CM | POA: Insufficient documentation

## 2019-03-10 ENCOUNTER — Other Ambulatory Visit: Payer: Self-pay | Admitting: Internal Medicine

## 2019-03-10 DIAGNOSIS — Z1231 Encounter for screening mammogram for malignant neoplasm of breast: Secondary | ICD-10-CM

## 2019-04-07 ENCOUNTER — Other Ambulatory Visit: Payer: Self-pay | Admitting: Physician Assistant

## 2019-04-07 DIAGNOSIS — R102 Pelvic and perineal pain unspecified side: Secondary | ICD-10-CM

## 2019-04-24 ENCOUNTER — Ambulatory Visit: Payer: Medicaid Other

## 2019-04-27 ENCOUNTER — Other Ambulatory Visit: Payer: Medicaid Other

## 2019-05-04 ENCOUNTER — Other Ambulatory Visit: Payer: Self-pay | Admitting: Physician Assistant

## 2019-05-04 ENCOUNTER — Other Ambulatory Visit: Payer: Medicaid Other

## 2019-05-05 ENCOUNTER — Ambulatory Visit
Admission: RE | Admit: 2019-05-05 | Discharge: 2019-05-05 | Disposition: A | Discharge status: Home / Self Care | Payer: Medicaid Other | Source: Ambulatory Visit | Attending: Physician Assistant | Admitting: Physician Assistant

## 2019-05-05 DIAGNOSIS — R102 Pelvic and perineal pain unspecified side: Secondary | ICD-10-CM

## 2019-05-10 ENCOUNTER — Ambulatory Visit: Payer: Medicaid Other

## 2019-05-12 ENCOUNTER — Other Ambulatory Visit: Payer: Self-pay | Admitting: Physician Assistant

## 2019-05-12 DIAGNOSIS — R109 Unspecified abdominal pain: Secondary | ICD-10-CM

## 2019-06-08 ENCOUNTER — Other Ambulatory Visit: Payer: Self-pay | Admitting: Physician Assistant

## 2019-06-08 DIAGNOSIS — R109 Unspecified abdominal pain: Secondary | ICD-10-CM

## 2019-06-08 DIAGNOSIS — R102 Pelvic and perineal pain: Secondary | ICD-10-CM

## 2019-06-10 ENCOUNTER — Other Ambulatory Visit: Payer: Medicaid Other

## 2019-07-14 ENCOUNTER — Other Ambulatory Visit: Payer: Self-pay

## 2019-07-14 ENCOUNTER — Emergency Department (HOSPITAL_COMMUNITY)
Admission: EM | Admit: 2019-07-14 | Discharge: 2019-07-15 | Disposition: A | Discharge status: Home / Self Care | Payer: Medicaid Other | Attending: Emergency Medicine | Admitting: Emergency Medicine

## 2019-07-14 DIAGNOSIS — Z79899 Other long term (current) drug therapy: Secondary | ICD-10-CM | POA: Insufficient documentation

## 2019-07-14 DIAGNOSIS — X58XXXA Exposure to other specified factors, initial encounter: Secondary | ICD-10-CM | POA: Diagnosis not present

## 2019-07-14 DIAGNOSIS — Y929 Unspecified place or not applicable: Secondary | ICD-10-CM | POA: Diagnosis not present

## 2019-07-14 DIAGNOSIS — Y939 Activity, unspecified: Secondary | ICD-10-CM | POA: Insufficient documentation

## 2019-07-14 DIAGNOSIS — S39012A Strain of muscle, fascia and tendon of lower back, initial encounter: Secondary | ICD-10-CM | POA: Insufficient documentation

## 2019-07-14 DIAGNOSIS — I10 Essential (primary) hypertension: Secondary | ICD-10-CM | POA: Insufficient documentation

## 2019-07-14 DIAGNOSIS — F17228 Nicotine dependence, chewing tobacco, with other nicotine-induced disorders: Secondary | ICD-10-CM | POA: Insufficient documentation

## 2019-07-14 DIAGNOSIS — Z96642 Presence of left artificial hip joint: Secondary | ICD-10-CM | POA: Diagnosis not present

## 2019-07-14 DIAGNOSIS — J449 Chronic obstructive pulmonary disease, unspecified: Secondary | ICD-10-CM | POA: Diagnosis not present

## 2019-07-14 DIAGNOSIS — R0981 Nasal congestion: Secondary | ICD-10-CM | POA: Diagnosis not present

## 2019-07-14 DIAGNOSIS — F17210 Nicotine dependence, cigarettes, uncomplicated: Secondary | ICD-10-CM | POA: Insufficient documentation

## 2019-07-14 DIAGNOSIS — Y998 Other external cause status: Secondary | ICD-10-CM | POA: Insufficient documentation

## 2019-07-14 DIAGNOSIS — S3992XA Unspecified injury of lower back, initial encounter: Secondary | ICD-10-CM | POA: Diagnosis present

## 2019-07-14 NOTE — ED Triage Notes (Signed)
Patient reports she had a tele-visit this morning and was prescribed medication.  Patient reports she is concerned because her back has been hurting her.  Patient states she had surgery about 1 year ago.  Patient is alert and oriented.

## 2019-07-15 ENCOUNTER — Encounter (HOSPITAL_COMMUNITY): Payer: Self-pay | Admitting: Emergency Medicine

## 2019-07-15 MED ORDER — LORATADINE 10 MG PO TABS
10.0000 mg | ORAL_TABLET | Freq: Once | ORAL | Status: AC
  Administered 2019-07-15: 10 mg via ORAL
  Filled 2019-07-15: qty 1

## 2019-07-15 MED ORDER — ACETAMINOPHEN 500 MG PO TABS
1000.0000 mg | ORAL_TABLET | Freq: Once | ORAL | Status: AC
  Administered 2019-07-15: 1000 mg via ORAL
  Filled 2019-07-15: qty 2

## 2019-07-15 MED ORDER — LIDOCAINE 5 % EX PTCH
1.0000 | MEDICATED_PATCH | CUTANEOUS | Status: DC
  Administered 2019-07-15: 01:00:00 1 via TRANSDERMAL
  Filled 2019-07-15: qty 1

## 2019-07-15 MED ORDER — LIDOCAINE 5 % EX PTCH: 1.0000 | MEDICATED_PATCH | CUTANEOUS | 0 refills | Status: DC

## 2019-07-15 NOTE — ED Notes (Signed)
Pt ambulated to the restroom with no difficulty  

## 2019-07-15 NOTE — ED Provider Notes (Signed)
[facility] DEPT Provider Note   CSN: [id number] Arrival date & time: 07/14/19  1602     History   Chief Complaint Chief Complaint  Patient presents with  . Back Pain  . Nasal Congestion    HPI Alicia Blake is a 53 y.o. female.     The history is provided by the patient.  Back Pain Location:  Sacro-iliac joint Quality:  Cramping Radiates to:  Does not radiate Pain severity:  Moderate Pain is:  Same all the time Onset quality:  Gradual Duration:  12 months Timing:  Constant Progression:  Unchanged Chronicity:  Chronic Context: not emotional stress, not falling, not jumping from heights, not lifting heavy objects, not MCA, not MVA, not occupational injury, not pedestrian accident, not physical stress, not recent illness, not recent injury and not twisting   Relieved by:  Nothing Worsened by:  Nothing Ineffective treatments:  None tried Associated symptoms: no abdominal pain, no abdominal swelling, no bladder incontinence, no bowel incontinence, no chest pain, no dysuria, no fever, no headaches, no leg pain, no numbness, no paresthesias, no pelvic pain, no perianal numbness, no tingling, no weakness and no weight loss   Risk factors: no hx of cancer   Patient with nasal congestion and back pain both chronic had e vist today but did not pick up meds.  No weakness no numbness no bowel or bladder incontinence.  No trauma.  No falls.  No gait abnormalities.    Past Medical History:  Diagnosis Date  . Asthma   . Bipolar affect, depressed ([place])   . Bronchitis   . Chronic back pain   . COPD (chronic obstructive pulmonary disease) ([place])   . DJD (degenerative joint disease)    BACK  . GERD (gastroesophageal reflux disease)   . Hypertension   . Pneumonia   . Pre-diabetes    dr entered diabetes without complications- no meds does not test sugar at home  . PTSD (post-traumatic stress disorder)   . Scoliosis   . Seizures ([place])    "when  drinking" last yrs ago  . Shortness of breath dyspnea    exersion  . Thyroid disease   . UTI (lower urinary tract infection)     Patient Active Problem List   Diagnosis Date Noted  . Viral respiratory illness 08/31/2017  . Cervicitis 08/31/2017  . Acute vaginitis 08/31/2017  . Lumbar stenosis with neurogenic claudication 06/18/2017  . Tobacco abuse 04/19/2016  . Primary osteoarthritis of left hip 04/18/2016  . Opioid use disorder, mild, abuse ([place]) 09/08/2015  . Hypokalemia 09/07/2015  . Schizoaffective disorder, bipolar type ([place]) 09/07/2015  . Chronic pain syndrome 09/07/2015  . Alcohol use disorder, moderate, in sustained remission ([place]) 09/07/2015  . Cocaine use disorder, moderate, in sustained remission ([place]) 09/07/2015  . Cannabis use disorder, moderate, in sustained remission ([place]) 09/07/2015  . DDD (degenerative disc disease), lumbar 09/07/2015  . Hx of fracture of ankle 09/07/2015  . Symptomatic Fibroids 08/09/2013  . Breast lump on left side at 9 o'clock position 06/29/2013  . Breast discharge 06/29/2013    Past Surgical History:  Procedure Laterality Date  . 2 LEFT TOES SURGERY  12/2014   finished only on right side  . BACK SURGERY    . BALLOON DILATION N/A 12/14/2015   Procedure: BALLOON DILATION;  Surgeon: Teena Irani, MD;  Location: [facility];  Service: Endoscopy;  Laterality: N/A;  . COLONOSCOPY WITH PROPOFOL N/A 12/14/2015   Procedure: COLONOSCOPY WITH PROPOFOL;  Surgeon:  Teena Irani, MD;  Location: [facility];  Service: Endoscopy;  Laterality: N/A;  . ESOPHAGOGASTRODUODENOSCOPY (EGD) WITH PROPOFOL N/A 12/14/2015   Procedure: ESOPHAGOGASTRODUODENOSCOPY (EGD) WITH PROPOFOL;  Surgeon: Teena Irani, MD;  Location: [facility];  Service: Endoscopy;  Laterality: N/A;  . JOINT REPLACEMENT     left hip  . LEFT ANKLE SURGERY  MARCH 2016  . LUMBAR LAMINECTOMY/DECOMPRESSION MICRODISCECTOMY N/A 06/18/2017   Procedure: LAMINECTOMY AND FORAMINOTOMY LUMBAR FOUR - LUMBAR  FIVE;  Surgeon: Ashok Pall, MD;  Location: [facility];  Service: Neurosurgery;  Laterality: N/A;  LAMINECTOMY AND FORAMINOTOMY LUMBAR 4- LUMBAR 5  . NECK SURGERY  AS TEENAGER   STITCHES TO NECK   . SURGERY FOR CUT ON STOMACH  TEENAGER  . TOTAL HIP ARTHROPLASTY Left 05/14/2016   Procedure: TOTAL HIP ARTHROPLASTY ANTERIOR APPROACH;  Surgeon: Renette Butters, MD;  Location: [facility];  Service: Orthopedics;  Laterality: Left;     OB History    Gravida  1   Para  1   Term  1   Preterm      AB      Living  1     SAB      TAB      Ectopic      Multiple      Live Births               Home Medications    Prior to Admission medications   Medication Sig Start Date End Date Taking? Authorizing Provider  albuterol (PROVENTIL HFA;VENTOLIN HFA) 108 (90 BASE) MCG/ACT inhaler Inhale 2 puffs into the lungs every 6 (six) hours as needed for wheezing.   Yes [provider]  albuterol (PROVENTIL) (2.5 MG/3ML) 0.083% nebulizer solution Take 2.5 mg by nebulization every 6 (six) hours as needed for wheezing or shortness of breath.   Yes [provider]  carbamazepine (TEGRETOL XR) 400 MG 12 hr tablet Take 400 mg by mouth 2 (two) times daily.   Yes [provider]  Cholecalciferol (VITAMIN D3) 1000 units CAPS Take 1,000 Units by mouth daily.   Yes [provider]  hydrOXYzine (VISTARIL) 25 MG capsule Take 25 mg by mouth 3 (three) times daily. 06/16/19  Yes [provider]  lithium carbonate (ESKALITH) 450 MG CR tablet Take 450 mg by mouth at bedtime.    Yes [provider]  losartan-hydrochlorothiazide (HYZAAR) 100-25 MG tablet Take 1 tablet by mouth daily.   Yes [provider]  montelukast (SINGULAIR) 10 MG tablet Take 10 mg by mouth daily.    Yes [provider]  Multiple Vitamin (MULTIVITAMIN) tablet Take 1 tablet by mouth daily.   Yes [provider]  PARoxetine (PAXIL) 40 MG tablet Take 40 mg by mouth daily.    Yes [provider]  QUEtiapine (SEROQUEL) 100 MG tablet Take 100 mg by mouth at bedtime.   Yes [provider]  docusate sodium (COLACE) 100 MG capsule Take 1 capsule (100 mg total) by mouth 2 (two) times daily. Patient not taking: Reported on 07/14/2019 05/14/16   Renette Butters, MD  guaifenesin (ROBITUSSIN) 100 MG/5ML syrup Take 10 mLs (200 mg total) by mouth every 4 (four) hours as needed for cough. Patient not taking: Reported on 07/14/2019 09/10/18   McDonald, Mia A, PA-C  lidocaine (LIDODERM) 5 % Place 1 patch onto the skin daily. Remove & Discard patch within 12 hours or as directed by MD 07/15/19   Randal Buba, Kobie Matkins, MD    Family History  Family History  Problem Relation Age of Onset  . Hypertension Mother   . Hypertension Maternal Grandmother   . Hypertension Paternal Grandmother   . Diabetes Paternal Grandmother   . Hypertension Paternal Grandfather   . Diabetes Paternal Grandfather   . Alcohol abuse Paternal Aunt   . Mental illness Cousin   . Heart attack Cousin   . Heart attack Maternal Uncle     Social History Social History   Tobacco Use  . Smoking status: Current Every Day Smoker    Packs/day: 0.25    Years: 26.00    Pack years: 6.50    Types: Cigarettes  . Smokeless tobacco: Current User  Substance Use Topics  . Alcohol use: No    Comment: quit date 01/17/2013  . Drug use: No    Types: "Crack" cocaine, Heroin    Comment: quit date 04-17-2014 FOR CRACK     Allergies   Lithium, Other, Risperidone and related, Buspirone, Shellfish allergy, Meloxicam, Prednisone, and Gabapentin   Review of Systems Review of Systems  Constitutional: Negative for fever and weight loss.  HENT: Positive for congestion.   Eyes: Negative for visual disturbance.  Respiratory: Negative for cough, chest tightness and shortness of breath.   Cardiovascular: Negative for chest pain and leg swelling.  Gastrointestinal: Negative for abdominal pain and bowel  incontinence.  Genitourinary: Negative for bladder incontinence, dysuria and pelvic pain.  Musculoskeletal: Positive for back pain. Negative for myalgias, neck pain and neck stiffness.  Neurological: Negative.  Negative for tingling, seizures, facial asymmetry, speech difficulty, weakness, light-headedness, numbness, headaches and paresthesias.  Psychiatric/Behavioral: Negative for agitation.  All other systems reviewed and are negative.    Physical Exam Updated Vital Signs BP (!) 152/89 (BP Location: Left Arm)   Pulse 99   Temp 98.7 F (37.1 C) (Oral)   Resp 18   LMP 12/09/2015 Comment: very irregular  SpO2 94%   Physical Exam Vitals signs and nursing note reviewed.  Constitutional:      Appearance: She is obese. She is not ill-appearing.  HENT:     Head: Normocephalic and atraumatic.     Nose: Nose normal.  Eyes:     Conjunctiva/sclera: Conjunctivae normal.     Pupils: Pupils are equal, round, and reactive to light.  Neck:     Musculoskeletal: Normal range of motion and neck supple.  Cardiovascular:     Rate and Rhythm: Normal rate and regular rhythm.     Pulses: Normal pulses.     Heart sounds: Normal heart sounds.  Pulmonary:     Effort: Pulmonary effort is normal.     Breath sounds: Normal breath sounds.  Abdominal:     General: Abdomen is flat. Bowel sounds are normal.     Tenderness: There is no abdominal tenderness. There is no guarding.  Musculoskeletal: Normal range of motion.  Skin:    General: Skin is warm and dry.     Capillary Refill: Capillary refill takes less than 2 seconds.  Neurological:     General: No focal deficit present.     Mental Status: She is alert and oriented to person, place, and time.     Sensory: No sensory deficit.     Motor: No weakness.     Deep Tendon Reflexes: Reflexes normal.  Psychiatric:        Mood and Affect: Mood normal.        Behavior: Behavior normal.      ED Treatments / Results  Labs (all labs  ordered are  listed, but only abnormal results are displayed) Labs Reviewed - No data to display  EKG None  Radiology No results found.  Procedures Procedures (including critical care time)  Medications Ordered in ED Medications  acetaminophen (TYLENOL) tablet 1,000 mg (has no administration in time range)  lidocaine (LIDODERM) 5 % 1 patch (has no administration in time range)  loratadine (CLARITIN) tablet 10 mg (has no administration in time range)    No red flags. No weakness or numbness.  No bowel or bladder symptoms.  No signs of infection nor cauda equina.  Stable for discharge with close follow up.  LOLLY PELOWSKI was evaluated in Emergency Department on 07/15/2019 for the symptoms described in the history of present illness. She was evaluated in the context of the global COVID-19 pandemic, which necessitated consideration that the patient might be at risk for infection with the SARS-CoV-2 virus that causes COVID-19. Institutional protocols and algorithms that pertain to the evaluation of patients at risk for COVID-19 are in a state of rapid change based on information released by regulatory bodies including the CDC and federal and state organizations. These policies and algorithms were followed during the patient's care in the ED.    Final Clinical Impressions(s) / ED Diagnoses   Final diagnoses:  Strain of lumbar region, initial encounter   Return for intractable cough, coughing up blood,fevers >100.4 unrelieved by medication, shortness of breath, intractable vomiting, chest pain, shortness of breath, weakness,numbness, changes in speech, facial asymmetry,abdominal pain, passing out,Inability to tolerate liquids or food, cough, altered mental status or any concerns. No signs of systemic illness or infection. The patient is nontoxic-appearing on exam and vital signs are within normal limits.   I have reviewed the triage vital signs and the nursing notes. Pertinent labs &imaging  results that were available during my care of the patient were reviewed by me and considered in my medical decision making (see chart for details).After history, exam, and medical workup I feel the patient has beenappropriately medically screened and is safe for discharge home. Pertinent diagnoses were discussed with the patient. Patient was given return precautions.  ED Discharge Orders         Ordered    lidocaine (LIDODERM) 5 %  Every 24 hours     07/15/19 0105           Chantil Bari, MD 07/15/19 0110

## 2019-07-16 ENCOUNTER — Other Ambulatory Visit: Payer: Self-pay

## 2019-07-16 ENCOUNTER — Emergency Department (HOSPITAL_COMMUNITY)
Admission: EM | Admit: 2019-07-16 | Discharge: 2019-07-17 | Disposition: A | Discharge status: Home / Self Care | Payer: Medicaid Other | Attending: Emergency Medicine | Admitting: Emergency Medicine

## 2019-07-16 ENCOUNTER — Encounter (HOSPITAL_COMMUNITY): Payer: Self-pay

## 2019-07-16 DIAGNOSIS — Z96642 Presence of left artificial hip joint: Secondary | ICD-10-CM | POA: Diagnosis not present

## 2019-07-16 DIAGNOSIS — Y9389 Activity, other specified: Secondary | ICD-10-CM | POA: Insufficient documentation

## 2019-07-16 DIAGNOSIS — M25552 Pain in left hip: Secondary | ICD-10-CM | POA: Insufficient documentation

## 2019-07-16 DIAGNOSIS — S5011XA Contusion of right forearm, initial encounter: Secondary | ICD-10-CM | POA: Diagnosis not present

## 2019-07-16 DIAGNOSIS — Y92410 Unspecified street and highway as the place of occurrence of the external cause: Secondary | ICD-10-CM | POA: Insufficient documentation

## 2019-07-16 DIAGNOSIS — Z23 Encounter for immunization: Secondary | ICD-10-CM | POA: Insufficient documentation

## 2019-07-16 DIAGNOSIS — S161XXA Strain of muscle, fascia and tendon at neck level, initial encounter: Secondary | ICD-10-CM | POA: Diagnosis not present

## 2019-07-16 DIAGNOSIS — J45909 Unspecified asthma, uncomplicated: Secondary | ICD-10-CM | POA: Diagnosis not present

## 2019-07-16 DIAGNOSIS — F17210 Nicotine dependence, cigarettes, uncomplicated: Secondary | ICD-10-CM | POA: Insufficient documentation

## 2019-07-16 DIAGNOSIS — R51 Headache: Secondary | ICD-10-CM | POA: Insufficient documentation

## 2019-07-16 DIAGNOSIS — Y999 Unspecified external cause status: Secondary | ICD-10-CM | POA: Insufficient documentation

## 2019-07-16 DIAGNOSIS — M25572 Pain in left ankle and joints of left foot: Secondary | ICD-10-CM | POA: Diagnosis not present

## 2019-07-16 DIAGNOSIS — Z79899 Other long term (current) drug therapy: Secondary | ICD-10-CM | POA: Diagnosis not present

## 2019-07-16 DIAGNOSIS — S39012A Strain of muscle, fascia and tendon of lower back, initial encounter: Secondary | ICD-10-CM | POA: Insufficient documentation

## 2019-07-16 DIAGNOSIS — S199XXA Unspecified injury of neck, initial encounter: Secondary | ICD-10-CM | POA: Diagnosis present

## 2019-07-16 DIAGNOSIS — I10 Essential (primary) hypertension: Secondary | ICD-10-CM | POA: Insufficient documentation

## 2019-07-16 NOTE — ED Triage Notes (Signed)
Pt c/o 10/10 pain to left ankle, left hip, left side of back,  and rt side shoulder.
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
## 2019-07-17 ENCOUNTER — Other Ambulatory Visit: Payer: Self-pay

## 2019-07-17 ENCOUNTER — Emergency Department (HOSPITAL_COMMUNITY): Payer: Medicaid Other

## 2019-07-17 MED ORDER — OXYCODONE HCL 5 MG PO TABS
10.0000 mg | ORAL_TABLET | Freq: Once | ORAL | Status: AC
  Administered 2019-07-17: 10 mg via ORAL
  Filled 2019-07-17: qty 2

## 2019-07-17 MED ORDER — TETANUS-DIPHTH-ACELL PERTUSSIS 5-2.5-18.5 LF-MCG/0.5 IM SUSP
0.5000 mL | Freq: Once | INTRAMUSCULAR | Status: AC
  Administered 2019-07-17: 0.5 mL via INTRAMUSCULAR
  Filled 2019-07-17: qty 0.5

## 2019-07-17 MED ORDER — METHOCARBAMOL 500 MG PO TABS: 500.0000 mg | ORAL_TABLET | Freq: Two times a day (BID) | ORAL | 0 refills | Status: DC

## 2019-07-17 NOTE — ED Notes (Signed)
Pt ambulated out of the ED in no distress. A&Ox4, declined vitals recheck.
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
## 2019-07-17 NOTE — Discharge Instructions (Addendum)
1. Medications: robaxin, usual home medications 2. Treatment: rest, drink plenty of fluids, gentle stretching as discussed, alternate ice and heat 3. Follow Up: Please followup with your primary doctor in 2-3 days for discussion of your diagnoses and further evaluation after today's visit;  Return to the ER for worsening back pain, difficulty walking, loss of bowel or bladder control or other concerning symptoms    

## 2019-07-27 ENCOUNTER — Other Ambulatory Visit: Payer: Self-pay

## 2019-07-27 ENCOUNTER — Ambulatory Visit
Admission: RE | Admit: 2019-07-27 | Discharge: 2019-07-27 | Disposition: A | Discharge status: Home / Self Care | Payer: Medicaid Other | Source: Ambulatory Visit | Attending: Internal Medicine | Admitting: Internal Medicine

## 2019-07-27 DIAGNOSIS — Z1231 Encounter for screening mammogram for malignant neoplasm of breast: Secondary | ICD-10-CM

## 2019-10-11 ENCOUNTER — Encounter (HOSPITAL_COMMUNITY): Payer: Self-pay | Admitting: Emergency Medicine

## 2019-10-11 ENCOUNTER — Ambulatory Visit (HOSPITAL_COMMUNITY)
Admission: EM | Admit: 2019-10-11 | Discharge: 2019-10-11 | Disposition: A | Discharge status: Home / Self Care | Payer: Medicaid Other | Attending: Internal Medicine | Admitting: Internal Medicine

## 2019-10-11 ENCOUNTER — Other Ambulatory Visit: Payer: Self-pay

## 2019-10-11 DIAGNOSIS — K219 Gastro-esophageal reflux disease without esophagitis: Secondary | ICD-10-CM | POA: Insufficient documentation

## 2019-10-11 DIAGNOSIS — M1612 Unilateral primary osteoarthritis, left hip: Secondary | ICD-10-CM | POA: Insufficient documentation

## 2019-10-11 DIAGNOSIS — J449 Chronic obstructive pulmonary disease, unspecified: Secondary | ICD-10-CM | POA: Insufficient documentation

## 2019-10-11 DIAGNOSIS — M419 Scoliosis, unspecified: Secondary | ICD-10-CM | POA: Diagnosis not present

## 2019-10-11 DIAGNOSIS — Z20828 Contact with and (suspected) exposure to other viral communicable diseases: Secondary | ICD-10-CM | POA: Diagnosis not present

## 2019-10-11 DIAGNOSIS — F1420 Cocaine dependence, uncomplicated: Secondary | ICD-10-CM | POA: Insufficient documentation

## 2019-10-11 DIAGNOSIS — B349 Viral infection, unspecified: Secondary | ICD-10-CM

## 2019-10-11 DIAGNOSIS — F250 Schizoaffective disorder, bipolar type: Secondary | ICD-10-CM | POA: Insufficient documentation

## 2019-10-11 DIAGNOSIS — M5136 Other intervertebral disc degeneration, lumbar region: Secondary | ICD-10-CM | POA: Diagnosis not present

## 2019-10-11 DIAGNOSIS — R0602 Shortness of breath: Secondary | ICD-10-CM | POA: Insufficient documentation

## 2019-10-11 DIAGNOSIS — G894 Chronic pain syndrome: Secondary | ICD-10-CM | POA: Insufficient documentation

## 2019-10-11 DIAGNOSIS — R197 Diarrhea, unspecified: Secondary | ICD-10-CM | POA: Insufficient documentation

## 2019-10-11 DIAGNOSIS — R519 Headache, unspecified: Secondary | ICD-10-CM | POA: Insufficient documentation

## 2019-10-11 DIAGNOSIS — E079 Disorder of thyroid, unspecified: Secondary | ICD-10-CM | POA: Insufficient documentation

## 2019-10-11 DIAGNOSIS — Z79899 Other long term (current) drug therapy: Secondary | ICD-10-CM | POA: Diagnosis not present

## 2019-10-11 DIAGNOSIS — R112 Nausea with vomiting, unspecified: Secondary | ICD-10-CM | POA: Insufficient documentation

## 2019-10-11 DIAGNOSIS — E876 Hypokalemia: Secondary | ICD-10-CM | POA: Insufficient documentation

## 2019-10-11 DIAGNOSIS — F17210 Nicotine dependence, cigarettes, uncomplicated: Secondary | ICD-10-CM | POA: Diagnosis not present

## 2019-10-11 DIAGNOSIS — E119 Type 2 diabetes mellitus without complications: Secondary | ICD-10-CM | POA: Diagnosis not present

## 2019-10-11 DIAGNOSIS — M48062 Spinal stenosis, lumbar region with neurogenic claudication: Secondary | ICD-10-CM | POA: Diagnosis not present

## 2019-10-11 DIAGNOSIS — I10 Essential (primary) hypertension: Secondary | ICD-10-CM | POA: Diagnosis not present

## 2019-10-11 LAB — CBC WITH DIFFERENTIAL/PLATELET
Abs Immature Granulocytes: 0.01 10*3/uL (ref 0.00–0.07)
Basophils Absolute: 0 10*3/uL (ref 0.0–0.1)
Basophils Relative: 0 %
Eosinophils Absolute: 0 10*3/uL (ref 0.0–0.5)
Eosinophils Relative: 0 %
HCT: 42.9 % (ref 36.0–46.0)
Hemoglobin: 14.2 g/dL (ref 12.0–15.0)
Immature Granulocytes: 0 %
Lymphocytes Relative: 26 %
Lymphs Abs: 1.2 10*3/uL (ref 0.7–4.0)
MCH: 29.4 pg (ref 26.0–34.0)
MCHC: 33.1 g/dL (ref 30.0–36.0)
MCV: 88.8 fL (ref 80.0–100.0)
Monocytes Absolute: 0.3 10*3/uL (ref 0.1–1.0)
Monocytes Relative: 6 %
Neutro Abs: 3.1 10*3/uL (ref 1.7–7.7)
Neutrophils Relative %: 68 %
Platelets: 188 10*3/uL (ref 150–400)
RBC: 4.83 MIL/uL (ref 3.87–5.11)
RDW: 13.3 % (ref 11.5–15.5)
WBC: 4.6 10*3/uL (ref 4.0–10.5)
nRBC: 0 % (ref 0.0–0.2)

## 2019-10-11 LAB — BASIC METABOLIC PANEL
Anion gap: 10 (ref 5–15)
BUN: <5 mg/dL — ABNORMAL LOW (ref 6–20)
CO2: 30 mmol/L (ref 22–32)
Calcium: 8.9 mg/dL (ref 8.9–10.3)
Chloride: 99 mmol/L (ref 98–111)
Creatinine, Ser: 0.71 mg/dL (ref 0.44–1.00)
GFR calc Af Amer: >60 mL/min (ref >60)
GFR calc non Af Amer: >60 mL/min (ref >60)
Glucose, Bld: 101 mg/dL — ABNORMAL HIGH (ref 70–99)
Potassium: 3.2 mmol/L — ABNORMAL LOW (ref 3.5–5.1)
Sodium: 139 mmol/L (ref 135–145)

## 2019-10-11 MED ORDER — ONDANSETRON HCL 4 MG PO TABS: 4.0000 mg | ORAL_TABLET | Freq: Four times a day (QID) | ORAL | 0 refills | Status: DC | PRN

## 2019-10-11 MED ORDER — ALBUTEROL SULFATE HFA 108 (90 BASE) MCG/ACT IN AERS: 2.0000 | INHALATION_SPRAY | Freq: Four times a day (QID) | RESPIRATORY_TRACT | 0 refills | Status: DC | PRN

## 2019-10-11 NOTE — ED Triage Notes (Signed)
Nausea, vomiting and diarrhea, headaches, weakness and chills for 3 days

## 2019-10-11 NOTE — ED Provider Notes (Addendum)
[place]    CSN: [id number] Arrival date & time: 10/11/19  1854      History   Chief Complaint Chief Complaint  Patient presents with  . Nausea  . Vomiting    HPI Alicia Blake is a 53 y.o. female with a history of asthma-on albuterol, hypertension-controlled comes to urgent care with complaints of nausea, vomiting, diarrhea, headaches and fatigue of 1 week duration.  Patient says symptoms have been ongoing for the past week and is gotten progressively worse.  Patient complains of upper respiratory infections initially and is progressed to nausea, vomiting and diarrhea.  She is also experienced subjective fever and generalized headaches.  No sick contacts.  No loss of smell or taste.  No abdominal pain or cramping.  Patient denies any dizziness, near syncope or syncopal episodes.  She has not tried any over-the-counter medications.  Her appetite is diminished secondary to nausea and vomiting.  Patient has a cough which is nonproductive.  She denies any chest tightness or wheezing  HPI  Past Medical History:  Diagnosis Date  . Asthma   . Bipolar affect, depressed ([place])   . Bronchitis   . Chronic back pain   . COPD (chronic obstructive pulmonary disease) ([place])   . DJD (degenerative joint disease)    BACK  . GERD (gastroesophageal reflux disease)   . Hypertension   . Pneumonia   . Pre-diabetes    dr entered diabetes without complications- no meds does not test sugar at home  . PTSD (post-traumatic stress disorder)   . Scoliosis   . Seizures ([place])    "when drinking" last yrs ago  . Shortness of breath dyspnea    exersion  . Thyroid disease   . UTI (lower urinary tract infection)     Patient Active Problem List   Diagnosis Date Noted  . Viral respiratory illness 08/31/2017  . Cervicitis 08/31/2017  . Acute vaginitis 08/31/2017  . Lumbar stenosis with neurogenic claudication 06/18/2017  . Tobacco abuse 04/19/2016  . Primary osteoarthritis of left hip  04/18/2016  . Opioid use disorder, mild, abuse ([place]) 09/08/2015  . Hypokalemia 09/07/2015  . Schizoaffective disorder, bipolar type ([place]) 09/07/2015  . Chronic pain syndrome 09/07/2015  . Alcohol use disorder, moderate, in sustained remission ([place]) 09/07/2015  . Cocaine use disorder, moderate, in sustained remission ([place]) 09/07/2015  . Cannabis use disorder, moderate, in sustained remission ([place]) 09/07/2015  . DDD (degenerative disc disease), lumbar 09/07/2015  . Hx of fracture of ankle 09/07/2015  . Symptomatic Fibroids 08/09/2013  . Breast lump on left side at 9 o'clock position 06/29/2013  . Breast discharge 06/29/2013    Past Surgical History:  Procedure Laterality Date  . 2 LEFT TOES SURGERY  12/2014   finished only on right side  . BACK SURGERY    . BALLOON DILATION N/A 12/14/2015   Procedure: BALLOON DILATION;  Surgeon: Teena Irani, MD;  Location: [facility];  Service: Endoscopy;  Laterality: N/A;  . COLONOSCOPY WITH PROPOFOL N/A 12/14/2015   Procedure: COLONOSCOPY WITH PROPOFOL;  Surgeon: Teena Irani, MD;  Location: [facility];  Service: Endoscopy;  Laterality: N/A;  . ESOPHAGOGASTRODUODENOSCOPY (EGD) WITH PROPOFOL N/A 12/14/2015   Procedure: ESOPHAGOGASTRODUODENOSCOPY (EGD) WITH PROPOFOL;  Surgeon: Teena Irani, MD;  Location: [facility];  Service: Endoscopy;  Laterality: N/A;  . JOINT REPLACEMENT     left hip  . LEFT ANKLE SURGERY  MARCH 2016  . LUMBAR LAMINECTOMY/DECOMPRESSION MICRODISCECTOMY N/A 06/18/2017   Procedure: LAMINECTOMY AND FORAMINOTOMY LUMBAR FOUR -  LUMBAR FIVE;  Surgeon: Ashok Pall, MD;  Location: [facility];  Service: Neurosurgery;  Laterality: N/A;  LAMINECTOMY AND FORAMINOTOMY LUMBAR 4- LUMBAR 5  . NECK SURGERY  AS TEENAGER   STITCHES TO NECK   . SURGERY FOR CUT ON STOMACH  TEENAGER  . TOTAL HIP ARTHROPLASTY Left 05/14/2016   Procedure: TOTAL HIP ARTHROPLASTY ANTERIOR APPROACH;  Surgeon: Renette Butters, MD;  Location: [facility];  Service: Orthopedics;   Laterality: Left;    OB History    Gravida  1   Para  1   Term  1   Preterm      AB      Living  1     SAB      TAB      Ectopic      Multiple      Live Births               Home Medications    Prior to Admission medications   Medication Sig Start Date End Date Taking? Authorizing Provider  albuterol (PROVENTIL) (2.5 MG/3ML) 0.083% nebulizer solution Take 2.5 mg by nebulization every 6 (six) hours as needed for wheezing or shortness of breath.    [provider]  albuterol (VENTOLIN HFA) 108 (90 Base) MCG/ACT inhaler Inhale 2 puffs into the lungs every 6 (six) hours as needed for wheezing. 10/11/19   Chase Picket, MD  carbamazepine (TEGRETOL XR) 400 MG 12 hr tablet Take 400 mg by mouth 2 (two) times daily.    [provider]  Cholecalciferol (VITAMIN D3) 1000 units CAPS Take 1,000 Units by mouth daily.    [provider]  hydrOXYzine (VISTARIL) 25 MG capsule Take 25 mg by mouth 3 (three) times daily. 06/16/19   [provider]  lidocaine (LIDODERM) 5 % Place 1 patch onto the skin daily. Remove & Discard patch within 12 hours or as directed by MD 07/15/19   Randal Buba, April, MD  lithium carbonate (ESKALITH) 450 MG CR tablet Take 450 mg by mouth at bedtime.     [provider]  losartan-hydrochlorothiazide (HYZAAR) 100-25 MG tablet Take 1 tablet by mouth daily.    [provider]  methocarbamol (ROBAXIN) 500 MG tablet Take 1 tablet (500 mg total) by mouth 2 (two) times daily. 07/17/19   Muthersbaugh, Jarrett Soho, PA-C  montelukast (SINGULAIR) 10 MG tablet Take 10 mg by mouth daily.     [provider]  Multiple Vitamin (MULTIVITAMIN) tablet Take 1 tablet by mouth daily.    [provider]  ondansetron (ZOFRAN) 4 MG tablet Take 1 tablet (4 mg total) by mouth every 6 (six) hours as needed for nausea or vomiting. 10/11/19   Logen Fowle, Myrene Galas, MD  PARoxetine (PAXIL) 40 MG tablet Take 40 mg by mouth daily.     [provider]  QUEtiapine (SEROQUEL) 100 MG tablet Take 100 mg by mouth at bedtime.    [provider]    Family History Family History  Problem Relation Age of Onset  . Hypertension Mother   . Hypertension Maternal Grandmother   . Hypertension Paternal Grandmother   . Diabetes Paternal Grandmother   . Hypertension Paternal Grandfather   . Diabetes Paternal Grandfather   . Alcohol abuse Paternal Aunt   . Mental illness Cousin   . Heart attack Cousin   . Heart attack Maternal Uncle     Social History Social History   Tobacco Use  . Smoking status: Current Every Day Smoker  Packs/day: 0.25    Years: 26.00    Pack years: 6.50    Types: Cigarettes  . Smokeless tobacco: Current User  Substance Use Topics  . Alcohol use: No    Comment: quit date 01/17/2013  . Drug use: No    Types: "Crack" cocaine, Heroin    Comment: quit date 04-17-2014 FOR CRACK     Allergies   Lithium, Other, Risperidone and related, Buspirone, Shellfish allergy, Meloxicam, Prednisone, and Gabapentin   Review of Systems Review of Systems  Constitutional: Positive for activity change, chills, fatigue and fever.  HENT: Positive for congestion. Negative for sinus pressure, sinus pain, sore throat and voice change.   Respiratory: Positive for cough and shortness of breath.   Cardiovascular: Negative for chest pain and palpitations.  Gastrointestinal: Positive for diarrhea, nausea and vomiting. Negative for abdominal distention, abdominal pain and constipation.  Genitourinary: Negative.   Musculoskeletal: Positive for arthralgias and myalgias.  Skin: Negative for rash and wound.  Neurological: Positive for weakness and headaches. Negative for dizziness and light-headedness.  Psychiatric/Behavioral: Negative for confusion and decreased concentration.     Physical Exam Triage Vital Signs ED Triage Vitals  Enc Vitals Group     BP 10/11/19 2018 (!) 144/80     Pulse Rate 10/11/19  2018 82     Resp 10/11/19 2018 (!) 24     Temp 10/11/19 2018 99 F (37.2 C)     Temp Source 10/11/19 2018 Oral     SpO2 10/11/19 2018 96 %     Weight --      Height --      Head Circumference --      Peak Flow --      Pain Score 10/11/19 2015 7     Pain Loc --      Pain Edu? --      Excl. in [place]? --    No data found.  Updated Vital Signs BP (!) 144/80 (BP Location: Right Arm)   Pulse 82   Temp 99 F (37.2 C) (Oral)   Resp (!) 24   LMP 12/09/2015 Comment: very irregular  SpO2 96%   Visual Acuity Right Eye Distance:   Left Eye Distance:   Bilateral Distance:    Right Eye Near:   Left Eye Near:    Bilateral Near:     Physical Exam Vitals and nursing note reviewed.  Constitutional:      General: She is not in acute distress.    Appearance: She is obese. She is ill-appearing. She is not toxic-appearing or diaphoretic.  HENT:     Right Ear: Tympanic membrane normal.     Left Ear: Tympanic membrane normal.     Nose: Nose normal. No rhinorrhea.  Cardiovascular:     Rate and Rhythm: Normal rate and regular rhythm.     Pulses: Normal pulses.     Heart sounds: Normal heart sounds. No murmur.  Pulmonary:     Effort: Pulmonary effort is normal.     Breath sounds: No stridor. Rhonchi present. No wheezing.     Comments: Few rhonchi in the right lung. Abdominal:     General: Bowel sounds are normal. There is no distension.     Palpations: Abdomen is soft.     Tenderness: There is no guarding or rebound.  Musculoskeletal:        General: No swelling, tenderness or signs of injury. Normal range of motion.  Skin:    General: Skin is warm.  Capillary Refill: Capillary refill takes less than 2 seconds.     Coloration: Skin is not pale.     Findings: No bruising or erythema.  Neurological:     General: No focal deficit present.     Mental Status: She is alert and oriented to person, place, and time.     Motor: No weakness.     Coordination: Coordination normal.       UC Treatments / Results  Labs (all labs ordered are listed, but only abnormal results are displayed) Labs Reviewed  BASIC METABOLIC PANEL - Abnormal; Notable for the following components:      Result Value   Potassium 3.2 (*)    Glucose, Bld 101 (*)    BUN <5 (*)    All other components within normal limits  NOVEL CORONAVIRUS, NAA (HOSP ORDER, SEND-OUT TO REF LAB; TAT 18-24 HRS)  CBC WITH DIFFERENTIAL/PLATELET    EKG   Radiology No results found.  Procedures Procedures (including critical care time)  Medications Ordered in UC Medications - No data to display  Initial Impression / Assessment and Plan / UC Course  I have reviewed the triage vital signs and the nursing notes.  Pertinent labs & imaging results that were available during my care of the patient were reviewed by me and considered in my medical decision making (see chart for details).     1.  Acute viral syndrome: COVID-19 testing done. CBC, BMP Zofran as needed for nausea/vomiting. Patient has some shortness of breath and will benefit from chest x-ray.  Patient is able to perform her activities of daily living without being overly short of breath.  She is able to complete her sentences.  Patient will have chest x-ray done in the morning since we do not have x-ray capability in the urgent care.  Patient is advised to go to emergency department if her respiratory symptoms worsen overnight. Final Clinical Impressions(s) / UC Diagnoses   Final diagnoses:  Viral syndrome   Discharge Instructions   None    ED Prescriptions    Medication Sig Dispense Auth. Provider   ondansetron (ZOFRAN) 4 MG tablet Take 1 tablet (4 mg total) by mouth every 6 (six) hours as needed for nausea or vomiting. 30 tablet Orvetta Danielski, Myrene Galas, MD   albuterol (VENTOLIN HFA) 108 (90 Base) MCG/ACT inhaler Inhale 2 puffs into the lungs every 6 (six) hours as needed for wheezing. 18 g Shaquella Stamant, Myrene Galas, MD     PDMP not reviewed this  encounter.   Chase Picket, MD 10/12/19 1702    Chase Picket, MD 10/12/19 [phone]

## 2019-10-14 LAB — NOVEL CORONAVIRUS, NAA (HOSP ORDER, SEND-OUT TO REF LAB; TAT 18-24 HRS): SARS-CoV-2, NAA: NOT DETECTED

## 2019-11-01 ENCOUNTER — Telehealth: Payer: Self-pay

## 2019-11-01 NOTE — Telephone Encounter (Signed)
Called patient to do their pre-visit COVID screening.  Call went to voicemail. Unable to do prescreening. Unsure if patient will be coming. Has had regular refills/visits with previous PCP.

## 2019-11-02 ENCOUNTER — Ambulatory Visit: Payer: Medicaid Other

## 2019-11-02 ENCOUNTER — Other Ambulatory Visit: Payer: Self-pay

## 2019-11-18 ENCOUNTER — Other Ambulatory Visit: Payer: Self-pay

## 2019-11-18 ENCOUNTER — Emergency Department (HOSPITAL_COMMUNITY)
Admission: EM | Admit: 2019-11-18 | Discharge: 2019-11-18 | Disposition: A | Discharge status: Home / Self Care | Payer: Medicaid Other | Attending: Emergency Medicine | Admitting: Emergency Medicine

## 2019-11-18 ENCOUNTER — Encounter (HOSPITAL_COMMUNITY): Payer: Self-pay

## 2019-11-18 DIAGNOSIS — F19129 Other psychoactive substance abuse with intoxication, unspecified: Secondary | ICD-10-CM | POA: Diagnosis not present

## 2019-11-18 DIAGNOSIS — I10 Essential (primary) hypertension: Secondary | ICD-10-CM | POA: Diagnosis not present

## 2019-11-18 DIAGNOSIS — T50901A Poisoning by unspecified drugs, medicaments and biological substances, accidental (unintentional), initial encounter: Secondary | ICD-10-CM | POA: Diagnosis present

## 2019-11-18 DIAGNOSIS — F17210 Nicotine dependence, cigarettes, uncomplicated: Secondary | ICD-10-CM | POA: Diagnosis not present

## 2019-11-18 DIAGNOSIS — F1910 Other psychoactive substance abuse, uncomplicated: Secondary | ICD-10-CM

## 2019-11-18 DIAGNOSIS — J449 Chronic obstructive pulmonary disease, unspecified: Secondary | ICD-10-CM | POA: Diagnosis not present

## 2019-11-18 DIAGNOSIS — Z79899 Other long term (current) drug therapy: Secondary | ICD-10-CM | POA: Insufficient documentation

## 2019-11-18 DIAGNOSIS — Z96642 Presence of left artificial hip joint: Secondary | ICD-10-CM | POA: Insufficient documentation

## 2019-11-18 LAB — CBC WITH DIFFERENTIAL/PLATELET
Abs Immature Granulocytes: 0.04 10*3/uL (ref 0.00–0.07)
Basophils Absolute: 0 10*3/uL (ref 0.0–0.1)
Basophils Relative: 0 %
Eosinophils Absolute: 0.1 10*3/uL (ref 0.0–0.5)
Eosinophils Relative: 1 %
HCT: 41.4 % (ref 36.0–46.0)
Hemoglobin: 13.4 g/dL (ref 12.0–15.0)
Immature Granulocytes: 0 %
Lymphocytes Relative: 8 %
Lymphs Abs: 0.8 10*3/uL (ref 0.7–4.0)
MCH: 29.5 pg (ref 26.0–34.0)
MCHC: 32.4 g/dL (ref 30.0–36.0)
MCV: 91.2 fL (ref 80.0–100.0)
Monocytes Absolute: 0.4 10*3/uL (ref 0.1–1.0)
Monocytes Relative: 4 %
Neutro Abs: 7.8 10*3/uL — ABNORMAL HIGH (ref 1.7–7.7)
Neutrophils Relative %: 87 %
Platelets: 224 10*3/uL (ref 150–400)
RBC: 4.54 MIL/uL (ref 3.87–5.11)
RDW: 15.2 % (ref 11.5–15.5)
WBC: 9 10*3/uL (ref 4.0–10.5)
nRBC: 0 % (ref 0.0–0.2)

## 2019-11-18 LAB — COMPREHENSIVE METABOLIC PANEL
ALT: 16 U/L (ref 0–44)
AST: 18 U/L (ref 15–41)
Albumin: 3.8 g/dL (ref 3.5–5.0)
Alkaline Phosphatase: 104 U/L (ref 38–126)
Anion gap: 11 (ref 5–15)
BUN: 8 mg/dL (ref 6–20)
CO2: 28 mmol/L (ref 22–32)
Calcium: 9.3 mg/dL (ref 8.9–10.3)
Chloride: 102 mmol/L (ref 98–111)
Creatinine, Ser: 0.74 mg/dL (ref 0.44–1.00)
GFR calc Af Amer: >60 mL/min (ref >60)
GFR calc non Af Amer: >60 mL/min (ref >60)
Glucose, Bld: 110 mg/dL — ABNORMAL HIGH (ref 70–99)
Potassium: 3.4 mmol/L — ABNORMAL LOW (ref 3.5–5.1)
Sodium: 141 mmol/L (ref 135–145)
Total Bilirubin: 0.6 mg/dL (ref 0.3–1.2)
Total Protein: 8.3 g/dL — ABNORMAL HIGH (ref 6.5–8.1)

## 2019-11-18 LAB — RAPID URINE DRUG SCREEN, HOSP PERFORMED
Amphetamines: NOT DETECTED
Barbiturates: NOT DETECTED
Benzodiazepines: NOT DETECTED
Cocaine: POSITIVE — AB
Opiates: NOT DETECTED
Tetrahydrocannabinol: NOT DETECTED

## 2019-11-18 LAB — ACETAMINOPHEN LEVEL: Acetaminophen (Tylenol), Serum: <10 ug/mL — ABNORMAL LOW (ref 10–30)

## 2019-11-18 LAB — I-STAT BETA HCG BLOOD, ED (MC, WL, AP ONLY): I-stat hCG, quantitative: <5 m[IU]/mL (ref <5)

## 2019-11-18 LAB — ETHANOL: Alcohol, Ethyl (B): <10 mg/dL (ref <10)

## 2019-11-18 NOTE — ED Notes (Signed)
Pt verbalizes understanding of DC instructions. Pt belongings returned and is ambulatory out of ED.  

## 2019-11-18 NOTE — ED Notes (Signed)
Attempted to draw blood for labs but was unsuccessful x2.

## 2019-11-18 NOTE — ED Notes (Signed)
Urine culture sent down with UDS. 
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
## 2019-11-18 NOTE — Discharge Instructions (Addendum)
Follow-up with your outpatient counselor for the substance abuse problem.  You have been in contact with them.  Return for any new or worse symptoms.  Return for any suicidal thoughts.  Avoid substances.  You had a near miss here tonight.

## 2019-11-18 NOTE — ED Notes (Signed)
ED Provider at bedside. 

## 2019-11-18 NOTE — ED Provider Notes (Signed)
[facility] DEPT Provider Note   CSN: [id number] Arrival date & time: 11/18/19  1805     History No chief complaint on file.   Alicia Blake is a 54 y.o. female.  Patient brought in by EMS.  She arrived from home accidental overdose.  Patient was discovered in the driver seat of car in front of her house by a bystander.  EMS was called.  When EMS arrived patient was agonal.  She in route was given 4 mg of Zofran 2 mg of Narcan IM patient became responsive and admitted to snorting a pill that was fentanyl.  Patient also admitted to using cocaine recently.  Patient has had a prior substance abuse problem.  About 2 weeks ago started using again had been using heavily mostly snorting hydrocodone.  Patient denies any suicidal or homicidal ideation.  Patient does already have a counselor that is helping her with the substance abuse.  Past medical history otherwise is significant for hypertension and posttraumatic stress disorder bipolar affect.  COPD.  Patient on upon arrival here temp was 98.5 heart rate was 76 respirations were 18 oxygen sats were 100% blood pressure was 180/100.  Patient alert and in no distress.        Past Medical History:  Diagnosis Date  . Asthma   . Bipolar affect, depressed ([place])   . Bronchitis   . Chronic back pain   . COPD (chronic obstructive pulmonary disease) ([place])   . DJD (degenerative joint disease)    BACK  . GERD (gastroesophageal reflux disease)   . Hypertension   . Pneumonia   . Pre-diabetes    dr entered diabetes without complications- no meds does not test sugar at home  . PTSD (post-traumatic stress disorder)   . Scoliosis   . Seizures ([place])    "when drinking" last yrs ago  . Shortness of breath dyspnea    exersion  . Thyroid disease   . UTI (lower urinary tract infection)     Patient Active Problem List   Diagnosis Date Noted  . Lumbar stenosis with neurogenic claudication 06/18/2017  . Tobacco abuse  04/19/2016  . Primary osteoarthritis of left hip 04/18/2016  . Opioid use disorder, mild, abuse ([place]) 09/08/2015  . Schizoaffective disorder, bipolar type ([place]) 09/07/2015  . Chronic pain syndrome 09/07/2015  . Alcohol use disorder, moderate, in sustained remission ([place]) 09/07/2015  . Cocaine use disorder, moderate, in sustained remission ([place]) 09/07/2015  . Cannabis use disorder, moderate, in sustained remission ([place]) 09/07/2015  . DDD (degenerative disc disease), lumbar 09/07/2015  . Hx of fracture of ankle 09/07/2015  . Symptomatic Fibroids 08/09/2013  . Breast lump on left side at 9 o'clock position 06/29/2013  . Breast discharge 06/29/2013    Past Surgical History:  Procedure Laterality Date  . 2 LEFT TOES SURGERY  12/2014   finished only on right side  . BACK SURGERY    . BALLOON DILATION N/A 12/14/2015   Procedure: BALLOON DILATION;  Surgeon: Teena Irani, MD;  Location: [facility];  Service: Endoscopy;  Laterality: N/A;  . COLONOSCOPY WITH PROPOFOL N/A 12/14/2015   Procedure: COLONOSCOPY WITH PROPOFOL;  Surgeon: Teena Irani, MD;  Location: [facility];  Service: Endoscopy;  Laterality: N/A;  . ESOPHAGOGASTRODUODENOSCOPY (EGD) WITH PROPOFOL N/A 12/14/2015   Procedure: ESOPHAGOGASTRODUODENOSCOPY (EGD) WITH PROPOFOL;  Surgeon: Teena Irani, MD;  Location: [facility];  Service: Endoscopy;  Laterality: N/A;  . JOINT REPLACEMENT     left hip  . LEFT ANKLE  SURGERY  MARCH 2016  . LUMBAR LAMINECTOMY/DECOMPRESSION MICRODISCECTOMY N/A 06/18/2017   Procedure: LAMINECTOMY AND FORAMINOTOMY LUMBAR FOUR - LUMBAR FIVE;  Surgeon: Ashok Pall, MD;  Location: [facility];  Service: Neurosurgery;  Laterality: N/A;  LAMINECTOMY AND FORAMINOTOMY LUMBAR 4- LUMBAR 5  . NECK SURGERY  AS TEENAGER   STITCHES TO NECK   . SURGERY FOR CUT ON STOMACH  TEENAGER  . TOTAL HIP ARTHROPLASTY Left 05/14/2016   Procedure: TOTAL HIP ARTHROPLASTY ANTERIOR APPROACH;  Surgeon: Renette Butters, MD;  Location: [facility];  Service:  Orthopedics;  Laterality: Left;     OB History    Gravida  1   Para  1   Term  1   Preterm      AB      Living  1     SAB      TAB      Ectopic      Multiple      Live Births              Family History  Problem Relation Age of Onset  . Hypertension Mother   . Hypertension Maternal Grandmother   . Hypertension Paternal Grandmother   . Diabetes Paternal Grandmother   . Hypertension Paternal Grandfather   . Diabetes Paternal Grandfather   . Alcohol abuse Paternal Aunt   . Mental illness Cousin   . Heart attack Cousin   . Heart attack Maternal Uncle     Social History   Tobacco Use  . Smoking status: Current Every Day Smoker    Packs/day: 0.25    Years: 26.00    Pack years: 6.50    Types: Cigarettes  . Smokeless tobacco: Current User  Substance Use Topics  . Alcohol use: No    Comment: quit date 01/17/2013  . Drug use: No    Types: "Crack" cocaine, Heroin    Comment: quit date 04-17-2014 FOR CRACK    Home Medications Prior to Admission medications   Medication Sig Start Date End Date Taking? Authorizing Provider  ABILIFY MAINTENA 400 MG PRSY prefilled syringe Inject 400 mg into the muscle every 30 (thirty) days. 08/17/19  Yes Elbert Ewings, FNP  albuterol (PROVENTIL) (2.5 MG/3ML) 0.083% nebulizer solution Take 2.5 mg by nebulization every 6 (six) hours as needed for wheezing or shortness of breath.   Yes [provider]  albuterol (VENTOLIN HFA) 108 (90 Base) MCG/ACT inhaler Inhale 2 puffs into the lungs every 6 (six) hours as needed for wheezing. 10/11/19  Yes Lamptey, Myrene Galas, MD  atomoxetine (STRATTERA) 40 MG capsule Take 40 mg by mouth daily. 09/18/19  Yes Elbert Ewings, FNP  cetirizine (ZYRTEC) 10 MG tablet Take 10 mg by mouth daily. 11/02/19  Yes [provider]  Cholecalciferol (VITAMIN D3) 1000 units CAPS Take 1,000 Units by mouth daily.   Yes [provider]  esomeprazole (NEXIUM) 40 MG capsule Take 40 mg by  mouth daily. 09/22/19  Yes [provider]  hydrOXYzine (VISTARIL) 25 MG capsule Take 25 mg by mouth 3 (three) times daily. 06/16/19  Yes [provider]  lithium carbonate 300 MG capsule Take 300 mg by mouth at bedtime. 09/18/19  Yes [provider]  losartan (COZAAR) 25 MG tablet Take 25 mg by mouth daily. 09/18/19  Yes Trey Sailors, PA  Multiple Vitamin (MULTIVITAMIN) tablet Take 1 tablet by mouth daily.   Yes [provider]  PARoxetine (PAXIL) 40 MG tablet Take 40 mg by mouth daily.  Yes [provider]  QUEtiapine (SEROQUEL) 100 MG tablet Take 100 mg by mouth at bedtime.   Yes [provider]  TEGRETOL 200 MG tablet Take 400 mg by mouth 2 (two) times daily. 09/20/19  Yes [provider]    Allergies    Lithium, Other, Risperidone and related, Buspirone, Shellfish allergy, Meloxicam, Prednisone, and Gabapentin  Review of Systems   Review of Systems  Constitutional: Negative for chills and fever.  HENT: Negative for congestion, rhinorrhea and sore throat.   Eyes: Negative for visual disturbance.  Respiratory: Negative for cough and shortness of breath.   Cardiovascular: Negative for chest pain and leg swelling.  Gastrointestinal: Negative for abdominal pain, diarrhea, nausea and vomiting.  Genitourinary: Negative for dysuria.  Musculoskeletal: Negative for back pain and neck pain.  Skin: Negative for rash.  Neurological: Negative for dizziness, light-headedness and headaches.  Hematological: Does not bruise/bleed easily.  Psychiatric/Behavioral: Negative for confusion and suicidal ideas.    Physical Exam Updated Vital Signs BP 129/87   Pulse 82   Temp 98.4 F (36.9 C) (Oral)   Resp 16   LMP 12/09/2015 Comment: very irregular  SpO2 94%   Physical Exam Vitals and nursing note reviewed.  Constitutional:      General: She is not in acute distress.    Appearance: Normal appearance. She is well-developed.   HENT:     Head: Normocephalic and atraumatic.  Eyes:     Extraocular Movements: Extraocular movements intact.     Conjunctiva/sclera: Conjunctivae normal.     Pupils: Pupils are equal, round, and reactive to light.  Cardiovascular:     Rate and Rhythm: Normal rate and regular rhythm.     Heart sounds: No murmur.  Pulmonary:     Effort: Pulmonary effort is normal. No respiratory distress.     Breath sounds: Normal breath sounds.  Abdominal:     Palpations: Abdomen is soft.     Tenderness: There is no abdominal tenderness.  Musculoskeletal:        General: Normal range of motion.     Cervical back: Normal range of motion and neck supple.  Skin:    General: Skin is warm and dry.  Neurological:     General: No focal deficit present.     Mental Status: She is alert and oriented to person, place, and time.     ED Results / Procedures / Treatments   Labs (all labs ordered are listed, but only abnormal results are displayed) Labs Reviewed  RAPID URINE DRUG SCREEN, HOSP PERFORMED - Abnormal; Notable for the following components:      Result Value   Cocaine POSITIVE (*)    All other components within normal limits  COMPREHENSIVE METABOLIC PANEL - Abnormal; Notable for the following components:   Potassium 3.4 (*)    Glucose, Bld 110 (*)    Total Protein 8.3 (*)    All other components within normal limits  CBC WITH DIFFERENTIAL/PLATELET - Abnormal; Notable for the following components:   Neutro Abs 7.8 (*)    All other components within normal limits  ACETAMINOPHEN LEVEL - Abnormal; Notable for the following components:   Acetaminophen (Tylenol), Serum <10 (*)    All other components within normal limits  ETHANOL  I-STAT BETA HCG BLOOD, ED ([facility], [facility], [facility] ONLY)   Results for orders placed or performed during the hospital encounter of 11/18/19  Ethanol  Result Value Ref Range   Alcohol, Ethyl (B) <10 <10 mg/dL  Rapid urine  drug screen (hospital performed)  Result Value Ref  Range   Opiates NONE DETECTED NONE DETECTED   Cocaine POSITIVE (A) NONE DETECTED   Benzodiazepines NONE DETECTED NONE DETECTED   Amphetamines NONE DETECTED NONE DETECTED   Tetrahydrocannabinol NONE DETECTED NONE DETECTED   Barbiturates NONE DETECTED NONE DETECTED  Comprehensive metabolic panel  Result Value Ref Range   Sodium 141 135 - 145 mmol/L   Potassium 3.4 (L) 3.5 - 5.1 mmol/L   Chloride 102 98 - 111 mmol/L   CO2 28 22 - 32 mmol/L   Glucose, Bld 110 (H) 70 - 99 mg/dL   BUN 8 6 - 20 mg/dL   Creatinine, Ser 0.74 0.44 - 1.00 mg/dL   Calcium 9.3 8.9 - 10.3 mg/dL   Total Protein 8.3 (H) 6.5 - 8.1 g/dL   Albumin 3.8 3.5 - 5.0 g/dL   AST 18 15 - 41 U/L   ALT 16 0 - 44 U/L   Alkaline Phosphatase 104 38 - 126 U/L   Total Bilirubin 0.6 0.3 - 1.2 mg/dL   GFR calc non Af Amer >60 >60 mL/min   GFR calc Af Amer >60 >60 mL/min   Anion gap 11 5 - 15  CBC with Differential/Platelet  Result Value Ref Range   WBC 9.0 4.0 - 10.5 K/uL   RBC 4.54 3.87 - 5.11 MIL/uL   Hemoglobin 13.4 12.0 - 15.0 g/dL   HCT 41.4 36.0 - 46.0 %   MCV 91.2 80.0 - 100.0 fL   MCH 29.5 26.0 - 34.0 pg   MCHC 32.4 30.0 - 36.0 g/dL   RDW 15.2 11.5 - 15.5 %   Platelets 224 150 - 400 K/uL   nRBC 0.0 0.0 - 0.2 %   Neutrophils Relative % 87 %   Neutro Abs 7.8 (H) 1.7 - 7.7 K/uL   Lymphocytes Relative 8 %   Lymphs Abs 0.8 0.7 - 4.0 K/uL   Monocytes Relative 4 %   Monocytes Absolute 0.4 0.1 - 1.0 K/uL   Eosinophils Relative 1 %   Eosinophils Absolute 0.1 0.0 - 0.5 K/uL   Basophils Relative 0 %   Basophils Absolute 0.0 0.0 - 0.1 K/uL   Immature Granulocytes 0 %   Abs Immature Granulocytes 0.04 0.00 - 0.07 K/uL  Acetaminophen level  Result Value Ref Range   Acetaminophen (Tylenol), Serum <10 (L) 10 - 30 ug/mL  I-Stat beta hCG blood, ED  Result Value Ref Range   I-stat hCG, quantitative <5.0 <5 mIU/mL   Comment 3             EKG EKG Interpretation  Date/Time:  Thursday November 18 2019 18:20:56 EST  Ventricular Rate:  95 PR Interval:    QRS Duration: 93 QT Interval:  361 QTC Calculation: 454 R Axis:   78 Text Interpretation: Sinus rhythm Confirmed by Fredia Sorrow [phone]) on 11/18/2019 7:47:14 PM   Radiology No results found.  Procedures Procedures (including critical care time)  Medications Ordered in ED Medications - No data to display  ED Course  I have reviewed the triage vital signs and the nursing notes.  Pertinent labs & imaging results that were available during my care of the patient were reviewed by me and considered in my medical decision making (see chart for details).    MDM Rules/Calculators/A&P                      Patient's remained awake and stable here.  Labs without significant findings.  Interesting that urine drug screen was positive for cocaine only negative for opiates.  Patient has been observed for period of time no acute distress.  Labs otherwise without any significant abnormalities.  Tylenol is not elevated alcohol is not elevated.  Liver function test are normal.  No leukocytosis.  Patient denies adamantly it being a suicide gesture.  Just a substance abuse problem she does have a counselor that she is working with.  Patient stable for discharge home.    Final Clinical Impression(s) / ED Diagnoses Final diagnoses:  Accidental drug overdose, initial encounter  Polysubstance abuse [facility])    Rx / DC Orders ED Discharge Orders    None       Fredia Sorrow, MD 11/18/19 2317

## 2020-01-25 ENCOUNTER — Other Ambulatory Visit: Payer: Self-pay | Admitting: Neurosurgery

## 2020-02-09 ENCOUNTER — Encounter (HOSPITAL_COMMUNITY)
Admission: RE | Admit: 2020-02-09 | Discharge: 2020-02-09 | Disposition: A | Discharge status: Home / Self Care | Payer: Medicaid Other | Source: Ambulatory Visit | Attending: Neurosurgery | Admitting: Neurosurgery

## 2020-02-09 ENCOUNTER — Other Ambulatory Visit: Payer: Self-pay

## 2020-02-09 ENCOUNTER — Encounter (HOSPITAL_COMMUNITY): Payer: Self-pay

## 2020-02-09 DIAGNOSIS — Z01812 Encounter for preprocedural laboratory examination: Secondary | ICD-10-CM | POA: Diagnosis not present

## 2020-02-09 LAB — COMPREHENSIVE METABOLIC PANEL
ALT: 17 U/L (ref 0–44)
AST: 18 U/L (ref 15–41)
Albumin: 3.2 g/dL — ABNORMAL LOW (ref 3.5–5.0)
Alkaline Phosphatase: 107 U/L (ref 38–126)
Anion gap: 9 (ref 5–15)
BUN: 5 mg/dL — ABNORMAL LOW (ref 6–20)
CO2: 28 mmol/L (ref 22–32)
Calcium: 9.1 mg/dL (ref 8.9–10.3)
Chloride: 101 mmol/L (ref 98–111)
Creatinine, Ser: 0.71 mg/dL (ref 0.44–1.00)
GFR calc Af Amer: >60 mL/min (ref >60)
GFR calc non Af Amer: >60 mL/min (ref >60)
Glucose, Bld: 108 mg/dL — ABNORMAL HIGH (ref 70–99)
Potassium: 3.5 mmol/L (ref 3.5–5.1)
Sodium: 138 mmol/L (ref 135–145)
Total Bilirubin: 0.5 mg/dL (ref 0.3–1.2)
Total Protein: 7.5 g/dL (ref 6.5–8.1)

## 2020-02-09 LAB — CBC
HCT: 39.5 % (ref 36.0–46.0)
Hemoglobin: 12.6 g/dL (ref 12.0–15.0)
MCH: 30.7 pg (ref 26.0–34.0)
MCHC: 31.9 g/dL (ref 30.0–36.0)
MCV: 96.1 fL (ref 80.0–100.0)
Platelets: 215 10*3/uL (ref 150–400)
RBC: 4.11 MIL/uL (ref 3.87–5.11)
RDW: 14.4 % (ref 11.5–15.5)
WBC: 6.4 10*3/uL (ref 4.0–10.5)
nRBC: 0 % (ref 0.0–0.2)

## 2020-02-09 LAB — TYPE AND SCREEN
ABO/RH(D): O POS
Antibody Screen: NEGATIVE

## 2020-02-09 LAB — SURGICAL PCR SCREEN
MRSA, PCR: NEGATIVE
Staphylococcus aureus: NEGATIVE

## 2020-02-09 LAB — HEMOGLOBIN A1C
Hgb A1c MFr Bld: 5.5 % (ref 4.8–5.6)
Mean Plasma Glucose: 111.15 mg/dL

## 2020-02-09 LAB — GLUCOSE, CAPILLARY: Glucose-Capillary: 99 mg/dL (ref 70–99)

## 2020-02-09 NOTE — Pre-Procedure Instructions (Signed)
[facility] [place] [address] Phone: [phone] Fax: [phone]  [facility] [phone] [address] [place] [address] Phone: [phone] Fax: [phone]     Your procedure is scheduled on Wednesday, April 21st, from 09:00 AM to 12:56 PM.  Report to [facility] Main Entrance "A" at 07:00 A.M., and check in at the Admitting office.  Call this number if you have problems the morning of surgery:  [phone]  Call [phone] if you have any questions prior to your surgery date Monday-Friday 8am-4pm.    Remember:  Do not eat or drink after midnight the night before your surgery.     Take these medicines the morning of surgery with A SIP OF WATER : atomoxetine (STRATTERA)  cetirizine (ZYRTEC)  esomeprazole (NEXIUM)  hydrOXYzine (VISTARIL) PARoxetine (PAXIL) TEGRETOL  IF NEEDED: tiZANidine (ZANAFLEX)  albuterol (VENTOLIN HFA) inhaler (*Please bring all inhalers with you the day of surgery*).  fluticasone (FLONASE) nasal spray   As of today, STOP taking any Aspirin (unless otherwise instructed by your surgeon) and Aspirin containing products, Aleve, Naproxen, Ibuprofen, Motrin, Advil, Goody's, BC's, all herbal medications, fish oil, and all vitamins.                      Do not wear jewelry, make up, or nail polish.            Do not wear lotions, powders, perfumes, or deodorant.            Do not shave 48 hours prior to surgery.              Do not bring valuables to the hospital.            [facility] is not responsible for any belongings or valuables.  Do NOT Smoke (Tobacco/Vapping) or drink Alcohol 24 hours prior to your procedure.  If you use a CPAP at night, you may bring all equipment for your overnight stay.   Contacts, glasses, dentures or bridgework may not be worn into surgery.      For patients  admitted to the hospital, discharge time will be determined by your treatment team.   Patients discharged the day of surgery will not be allowed to drive home, and someone needs to stay with them for 24 hours.    Special instructions:   [facility]- Preparing For Surgery  Before surgery, you can play an important role. Because skin is not sterile, your skin needs to be as free of germs as possible. You can reduce the number of germs on your skin by washing with CHG (chlorahexidine gluconate) Soap before surgery.  CHG is an antiseptic cleaner which kills germs and bonds with the skin to continue killing germs even after washing.    Oral Hygiene is also important to reduce your risk of infection.  Remember - BRUSH YOUR TEETH THE MORNING OF SURGERY WITH YOUR REGULAR TOOTHPASTE  Please do not use if you have an allergy to CHG or antibacterial soaps. If your skin becomes reddened/irritated stop using the CHG.  Do not shave (including legs and underarms) for at least 48 hours prior to first CHG shower. It is OK to shave your face.  Please follow these instructions carefully.   1. Shower the NIGHT BEFORE SURGERY and the MORNING OF SURGERY with CHG Soap.   2. If you chose to wash your  hair, wash your hair first as usual with your normal shampoo.  3. After you shampoo, rinse your hair and body thoroughly to remove the shampoo.  4. Use CHG as you would any other liquid soap. You can apply CHG directly to the skin and wash gently with a scrungie or a clean washcloth.   5. Apply the CHG Soap to your body ONLY FROM THE NECK DOWN.  Do not use on open wounds or open sores. Avoid contact with your eyes, ears, mouth and genitals (private parts). Wash Face and genitals (private parts)  with your normal soap.   6. Wash thoroughly, paying special attention to the area where your surgery will be performed.  7. Thoroughly rinse your body with warm water from the neck down.  8. DO NOT shower/wash with your  normal soap after using and rinsing off the CHG Soap.  9. Pat yourself dry with a CLEAN TOWEL.  10. Wear CLEAN PAJAMAS to bed the night before surgery, wear comfortable clothes the morning of surgery  11. Place CLEAN SHEETS on your bed the night of your first shower and DO NOT SLEEP WITH PETS.   Day of Surgery:   Do not apply any deodorants/lotions.  Please wear clean clothes to the hospital/surgery center.   Remember to brush your teeth WITH YOUR REGULAR TOOTHPASTE.   Please read over the following fact sheets that you were given.

## 2020-02-09 NOTE — Progress Notes (Addendum)
PCP - Estrella Myrtle, MD Cardiologist - Denies  PPM/ICD - Denies  Chest x-ray - 07/17/19 EKG - 11/18/19 Stress Test - 04/25/16 ECHO - 04/25/16 Cardiac Cath - Denies  Sleep Study - Denies CPAP - N/A  Patient is pre-diabetic, does not check CBG. CBG during PAT visit was 99. A1C obtained.   Blood Thinner Instructions: N/A Aspirin Instructions: N/A  ERAS Protcol - N/A PRE-SURGERY Ensure or G2- N/A  COVID TEST- 02/12/20   Anesthesia review: Yes, review ECHO, Stress test.   Patient denies shortness of breath, fever, cough and chest pain at PAT appointment   All instructions explained to the patient, with a verbal understanding of the material. Patient agrees to go over the instructions while at home for a better understanding. Patient also instructed to self quarantine after being tested for COVID-19. The opportunity to ask questions was provided.

## 2020-02-09 NOTE — Progress Notes (Signed)
   02/09/20 1319  OBSTRUCTIVE SLEEP APNEA  Have you ever been diagnosed with sleep apnea through a sleep study? No  Do you snore loudly (loud enough to be heard through closed doors)?  1  Do you often feel tired, fatigued, or sleepy during the daytime (such as falling asleep during driving or talking to someone)? 0  Has anyone observed you stop breathing during your sleep? 1  Do you have, or are you being treated for high blood pressure? 1  BMI more than 35 kg/m2? 1  Age > 50 (1-yes) 1  Neck circumference greater than:Female 16 inches or larger, Female 17inches or larger? 0  Female Gender (Yes=1) 0  Obstructive Sleep Apnea Score 5

## 2020-02-10 ENCOUNTER — Encounter (HOSPITAL_COMMUNITY): Payer: Self-pay | Admitting: Vascular Surgery

## 2020-02-10 NOTE — Anesthesia Preprocedure Evaluation (Deleted)
Anesthesia Evaluation Anesthesia Physical Anesthesia Plan  ASA:   Anesthesia Plan:    Post-op Pain Management:    Induction:   PONV Risk Score and Plan:   Airway Management Planned:   Additional Equipment:   Intra-op Plan:   Post-operative Plan:   Informed Consent:   Plan Discussed with:   Anesthesia Plan Comments: (PAT note written 02/10/2020 by Myra Gianotti, PA-C. )        Anesthesia Quick Evaluation

## 2020-02-10 NOTE — Progress Notes (Signed)
Anesthesia Chart Review:  Case: [id number] Date/Time: 02/16/20 0845   Procedure: Left Lumbar 5 Sacral 1 Posterior lumbar interbody fusion (Left ) - 3C   Anesthesia type: General   Pre-op diagnosis: Osteoarthritis of facet joint   Location: [facility] OR ROOM 18 / [place] OR   Surgeons: Ashok Pall, MD      DISCUSSION: Patient is a 54 year old female scheduled for the above procedure.  History includes smoking, HTN, DM2 (diet controlled), COPD, asthma, exertional dyspnea, Bipolar disorder, PTSD, scoliosis, seizures (with alcohol years ago), GERD, left THA (05/14/16), back surgery (L4-5 laminectomy/foraminotomy 06/19/17). BMI is consistent with obesity.  OSA screening score is 5. Thyroid disease is listed in history, but patient unsure about details--she doesn't recall ever having to be on medication for abnormal thyroid labs. She also doesn't recall being diagnosed with any thyroid nodules.   - ED visit 11/18/19 for accidental overdose (per ED note, she reported snorting fentanyl). Found sitting in car at her house with agonal breathing and given Zofran and Narcan by EMS with responsiveness. Also history of using cocaine. Rapid UDS only positive for cocaine. Already established with substance abuse counselor, so discharged home following observation in the ED.    I called and spoke with patient. She reported that she had a relapse with cocaine and alcohol about 90 days ago but is again clean, although she did admit to taking one Percocet that was not prescribed to her earlier this week. She denied chest pain, SOB, palpitations. Will occasional get mild LLE edema (on-and-off) which she thinks occurs when her left hip pain is acting up--did not report chronic or worsening edema. Denied known cardiac issues. Had unremarkable testing in 2017. Activity is currently limited due to her back pain. Recent EKG showed NSR.   Nikki at Dr. Lacy Duverney office notified of + UDS for cocaine in 10/2019 but that patient reportedly clean  since. Patient aware of avoid illicit drugs prior to surgery and that there is potential for UDS prior to surgery if felt warranted by her surgeon and/or anesthesiologist. She is scheduled for COVID-19 test on 02/12/20. Anesthesia team to evaluate on the day of surgery.     VS: BP 140/75   Pulse 87   Temp 36.9 C (Oral)   Resp 18   Ht 5\' 6"  (1.676 m)   Wt 108 kg   LMP 12/09/2015 Comment: very irregular  SpO2 99%   BMI 38.43 kg/m    PROVIDERS: Trey Sailors, PA is PCP - She was evaluated by cardiologist Dr. Lauree Chandler on 04/22/16 for evaluation of chest pain and surgical clearance for left THA. She was ultimately cleared for that surgery following stress and echo (see below).    LABS: Labs reviewed: Acceptable for surgery. (all labs ordered are listed, but only abnormal results are displayed)  Labs Reviewed  COMPREHENSIVE METABOLIC PANEL - Abnormal; Notable for the following components:      Result Value   Glucose, Bld 108 (*)    BUN 5 (*)    Albumin 3.2 (*)    All other components within normal limits  SURGICAL PCR SCREEN  CBC  HEMOGLOBIN A1C  GLUCOSE, CAPILLARY  TYPE AND SCREEN     EKG: 11/18/19: SR.    CV: Nuclear stress test 04/25/16:   There was no ST segment deviation noted during stress.  No T wave inversion was noted during stress.  The study is normal.  The left ventricular ejection fraction is hyperdynamic (>65%).  Nuclear stress EF:  72%. 1. No ischemia or infarction by perfusion images.  2. Normal EF and wall motion.   Echo 04/25/16:  Study Conclusions: - Left ventricle: The cavity size was normal. Wall thickness was normal. Systolic function was normal. The estimated ejection fraction was in the range of 55% to 60%. Wall motion was normal; there were no regional wall motion abnormalities. Doppler parameters are consistent with abnormal left ventricular relaxation (grade 1 diastolic dysfunction). - Aortic valve: There was no  stenosis. - Mitral valve: There was trivial regurgitation. - Right ventricle: The cavity size was normal. Systolic function was normal. - Pulmonary arteries: PA peak pressure: 27 mm Hg (S). - Inferior vena cava: The vessel was normal in size. The respirophasic diameter changes were in the normal range (= 50%), consistent with normal central venous pressure. - Pericardium, extracardiac: Small circumferential pericardial effusion, no tamponade. Impressions:  - Normal LV size and systolic function, EF 0000000. Normal RV size  and systolic function. No significant valvular abnormalities.    Past Medical History:  Diagnosis Date  . Asthma   . Bipolar affect, depressed ([place])   . Bronchitis   . Chronic back pain   . COPD (chronic obstructive pulmonary disease) ([place])   . DJD (degenerative joint disease)    BACK  . GERD (gastroesophageal reflux disease)   . Hypertension   . Pneumonia   . Pre-diabetes    dr entered diabetes without complications- no meds does not test sugar at home  . PTSD (post-traumatic stress disorder)   . Scoliosis   . Seizures ([place])    "when drinking" last yrs ago  . Shortness of breath dyspnea    exersion  . Thyroid disease   . UTI (lower urinary tract infection)     Past Surgical History:  Procedure Laterality Date  . 2 LEFT TOES SURGERY  12/2014   finished only on right side  . BACK SURGERY    . BALLOON DILATION N/A 12/14/2015   Procedure: BALLOON DILATION;  Surgeon: Teena Irani, MD;  Location: [facility];  Service: Endoscopy;  Laterality: N/A;  . COLONOSCOPY WITH PROPOFOL N/A 12/14/2015   Procedure: COLONOSCOPY WITH PROPOFOL;  Surgeon: Teena Irani, MD;  Location: [facility];  Service: Endoscopy;  Laterality: N/A;  . ESOPHAGOGASTRODUODENOSCOPY (EGD) WITH PROPOFOL N/A 12/14/2015   Procedure: ESOPHAGOGASTRODUODENOSCOPY (EGD) WITH PROPOFOL;  Surgeon: Teena Irani, MD;  Location: [facility];  Service: Endoscopy;  Laterality: N/A;  . JOINT REPLACEMENT     left  hip  . LEFT ANKLE SURGERY  MARCH 2016  . LUMBAR LAMINECTOMY/DECOMPRESSION MICRODISCECTOMY N/A 06/18/2017   Procedure: LAMINECTOMY AND FORAMINOTOMY LUMBAR FOUR - LUMBAR FIVE;  Surgeon: Ashok Pall, MD;  Location: [facility];  Service: Neurosurgery;  Laterality: N/A;  LAMINECTOMY AND FORAMINOTOMY LUMBAR 4- LUMBAR 5  . NECK SURGERY  AS TEENAGER   STITCHES TO NECK   . SURGERY FOR CUT ON STOMACH  TEENAGER  . TOTAL HIP ARTHROPLASTY Left 05/14/2016   Procedure: TOTAL HIP ARTHROPLASTY ANTERIOR APPROACH;  Surgeon: Renette Butters, MD;  Location: [facility];  Service: Orthopedics;  Laterality: Left;    MEDICATIONS: . HYDROcodone-acetaminophen (NORCO/VICODIN) 5-325 MG tablet  . ABILIFY MAINTENA 400 MG PRSY prefilled syringe  . albuterol (VENTOLIN HFA) 108 (90 Base) MCG/ACT inhaler  . atomoxetine (STRATTERA) 40 MG capsule  . cetirizine (ZYRTEC) 10 MG tablet  . esomeprazole (NEXIUM) 40 MG capsule  . fluticasone (FLONASE) 50 MCG/ACT nasal spray  . furosemide (LASIX) 20 MG tablet  . hydrOXYzine (VISTARIL) 25 MG capsule  .  lithium carbonate 300 MG capsule  . losartan (COZAAR) 25 MG tablet  . Multiple Vitamin (MULTIVITAMIN WITH MINERALS) TABS tablet  . Multiple Vitamin (MULTIVITAMIN) tablet  . PARoxetine (PAXIL) 40 MG tablet  . QUEtiapine (SEROQUEL) 200 MG tablet  . TEGRETOL 200 MG tablet  . tiZANidine (ZANAFLEX) 4 MG tablet   No current facility-administered medications for this encounter.     Myra Gianotti, PA-C Surgical Short Stay/Anesthesiology [facility] Phone [phone] [facility] Phone [phone] 02/10/2020 3:01 PM

## 2020-02-12 ENCOUNTER — Other Ambulatory Visit (HOSPITAL_COMMUNITY): Payer: Medicaid Other

## 2020-02-16 ENCOUNTER — Inpatient Hospital Stay (HOSPITAL_COMMUNITY): Admission: RE | Admit: 2020-02-16 | Payer: Medicaid Other | Source: Home / Self Care | Admitting: Neurosurgery

## 2020-02-16 ENCOUNTER — Encounter (HOSPITAL_COMMUNITY): Admission: RE | Payer: Self-pay | Source: Home / Self Care

## 2020-02-16 SURGERY — POSTERIOR LUMBAR FUSION 1 LEVEL
Anesthesia: General | Laterality: Left

## 2020-02-23 ENCOUNTER — Other Ambulatory Visit: Payer: Self-pay | Admitting: Neurosurgery

## 2020-02-24 ENCOUNTER — Other Ambulatory Visit: Payer: Self-pay | Admitting: Neurosurgery

## 2020-03-06 NOTE — Progress Notes (Signed)
[facility] [place] [address] Phone: [phone] Fax: [phone]  [facility] [phone] [place], [place] - [address] [place] [address] Phone: [phone] Fax: [phone]      Your procedure is scheduled on Friday, Mar 10, 2020.  Report to [facility] Main Entrance "A" at 8:30 A.M., and check in at the Admitting office.  Call this number if you have problems the morning of surgery:  [phone]  Call [phone] if you have any questions prior to your surgery date Monday-Friday 8am-4pm    Remember:  Do not eat or drink after midnight the night before your surgery    Take these medicines the morning of surgery with A SIP OF WATER:  atomoxetine (STRATTERA) cetirizine (ZYRTEC) dicyclomine (BENTYL) esomeprazole (NEXIUM) fluticasone (FLONASE)  hydrOXYzine (VISTARIL) PARoxetine (PAXIL)  TEGRETOL  If needed: albuterol (PROVENTIL) albuterol (VENTOLIN HFA)  ondansetron (ZOFRAN) tiZANidine (ZANAFLEX)  Please bring all inhalers with you the day of surgery.    As of today, STOP taking any Aspirin (unless otherwise instructed by your surgeon) and Aspirin containing products, Aleve, Naproxen, Ibuprofen, Motrin, Advil, Goody's, BC's, all herbal medications, fish oil, and all vitamins.                      Do not wear jewelry, make up, or nail polish            Do not wear lotions, powders, perfumes, or deodorant.            Do not shave 48 hours prior to surgery.            Do not bring valuables to the hospital.            [facility] is not responsible for any belongings or valuables.  Do NOT Smoke (Tobacco/Vapping) or drink Alcohol 24 hours prior to your procedure If you use a CPAP at night, you may bring all equipment for your overnight stay.   Contacts, glasses, dentures or bridgework may not be worn into surgery.      For  patients admitted to the hospital, discharge time will be determined by your treatment team.   Patients discharged the day of surgery will not be allowed to drive home, and someone needs to stay with them for 24 hours.    Special instructions:   [facility]- Preparing For Surgery  Before surgery, you can play an important role. Because skin is not sterile, your skin needs to be as free of germs as possible. You can reduce the number of germs on your skin by washing with CHG (chlorahexidine gluconate) Soap before surgery.  CHG is an antiseptic cleaner which kills germs and bonds with the skin to continue killing germs even after washing.    Oral Hygiene is also important to reduce your risk of infection.  Remember - BRUSH YOUR TEETH THE MORNING OF SURGERY WITH YOUR REGULAR TOOTHPASTE  Please do not use if you have an allergy to CHG or antibacterial soaps. If your skin becomes reddened/irritated stop using the CHG.  Do not shave (including legs and underarms) for at least 48 hours prior to first CHG shower. It is OK to shave your face.  Please follow these instructions carefully.   1. Shower the NIGHT BEFORE SURGERY and the MORNING OF SURGERY with CHG Soap.   2. If you chose to wash your hair, wash your hair first  as usual with your normal shampoo.  3. After you shampoo, rinse your hair and body thoroughly to remove the shampoo.  4. Use CHG as you would any other liquid soap. You can apply CHG directly to the skin and wash gently with a scrungie or a clean washcloth.   5. Apply the CHG Soap to your body ONLY FROM THE NECK DOWN.  Do not use on open wounds or open sores. Avoid contact with your eyes, ears, mouth and genitals (private parts). Wash Face and genitals (private parts)  with your normal soap.   6. Wash thoroughly, paying special attention to the area where your surgery will be performed.  7. Thoroughly rinse your body with warm water from the neck down.  8. DO NOT shower/wash  with your normal soap after using and rinsing off the CHG Soap.  9. Pat yourself dry with a CLEAN TOWEL.  10. Wear CLEAN PAJAMAS to bed the night before surgery, wear comfortable clothes the morning of surgery  11. Place CLEAN SHEETS on your bed the night of your first shower and DO NOT SLEEP WITH PETS.   Day of Surgery:   Do not apply any deodorants/lotions.  Please wear clean clothes to the hospital/surgery center.   Remember to brush your teeth WITH YOUR REGULAR TOOTHPASTE.   Please read over the following fact sheets that you were given.

## 2020-03-07 ENCOUNTER — Other Ambulatory Visit (HOSPITAL_COMMUNITY)
Admission: RE | Admit: 2020-03-07 | Discharge: 2020-03-07 | Disposition: A | Discharge status: Home / Self Care | Payer: Medicaid Other | Source: Ambulatory Visit | Attending: Neurosurgery | Admitting: Neurosurgery

## 2020-03-07 ENCOUNTER — Encounter (HOSPITAL_COMMUNITY): Payer: Self-pay

## 2020-03-07 ENCOUNTER — Encounter (HOSPITAL_COMMUNITY)
Admission: RE | Admit: 2020-03-07 | Discharge: 2020-03-07 | Disposition: A | Discharge status: Home / Self Care | Payer: Medicaid Other | Source: Ambulatory Visit | Attending: Neurosurgery | Admitting: Neurosurgery

## 2020-03-07 ENCOUNTER — Other Ambulatory Visit: Payer: Self-pay

## 2020-03-07 DIAGNOSIS — Z20822 Contact with and (suspected) exposure to covid-19: Secondary | ICD-10-CM | POA: Insufficient documentation

## 2020-03-07 DIAGNOSIS — Z01812 Encounter for preprocedural laboratory examination: Secondary | ICD-10-CM | POA: Insufficient documentation

## 2020-03-07 LAB — COMPREHENSIVE METABOLIC PANEL
ALT: 13 U/L (ref 0–44)
AST: 14 U/L — ABNORMAL LOW (ref 15–41)
Albumin: 3.2 g/dL — ABNORMAL LOW (ref 3.5–5.0)
Alkaline Phosphatase: 106 U/L (ref 38–126)
Anion gap: 8 (ref 5–15)
BUN: 7 mg/dL (ref 6–20)
CO2: 30 mmol/L (ref 22–32)
Calcium: 8.9 mg/dL (ref 8.9–10.3)
Chloride: 103 mmol/L (ref 98–111)
Creatinine, Ser: 0.79 mg/dL (ref 0.44–1.00)
GFR calc Af Amer: >60 mL/min (ref >60)
GFR calc non Af Amer: >60 mL/min (ref >60)
Glucose, Bld: 95 mg/dL (ref 70–99)
Potassium: 3.3 mmol/L — ABNORMAL LOW (ref 3.5–5.1)
Sodium: 141 mmol/L (ref 135–145)
Total Bilirubin: 0.5 mg/dL (ref 0.3–1.2)
Total Protein: 7.3 g/dL (ref 6.5–8.1)

## 2020-03-07 LAB — CBC
HCT: 38.2 % (ref 36.0–46.0)
Hemoglobin: 12.1 g/dL (ref 12.0–15.0)
MCH: 30.5 pg (ref 26.0–34.0)
MCHC: 31.7 g/dL (ref 30.0–36.0)
MCV: 96.2 fL (ref 80.0–100.0)
Platelets: 215 10*3/uL (ref 150–400)
RBC: 3.97 MIL/uL (ref 3.87–5.11)
RDW: 13.5 % (ref 11.5–15.5)
WBC: 6.6 10*3/uL (ref 4.0–10.5)
nRBC: 0 % (ref 0.0–0.2)

## 2020-03-07 LAB — SARS CORONAVIRUS 2 (TAT 6-24 HRS): SARS Coronavirus 2: NEGATIVE

## 2020-03-07 LAB — TYPE AND SCREEN
ABO/RH(D): O POS
Antibody Screen: NEGATIVE

## 2020-03-07 LAB — SURGICAL PCR SCREEN
MRSA, PCR: NEGATIVE
Staphylococcus aureus: NEGATIVE

## 2020-03-07 NOTE — Progress Notes (Signed)
PCP - Raelyn Number, PA Cardiologist - Denies  PPM/ICD - Denies  Chest x-ray - 07/17/19 EKG - 11/19/19 Stress Test - 04/25/16 ECHO - 04/25/16 Cardiac Cath - Denies  Sleep Study - Denies  Pt is a Type II Diabetic, but does not check her sugars.  Blood Thinner Instructions: N/A Aspirin Instructions: N/A  ERAS Protcol - No  COVID TEST- 03/07/20   Coronavirus Screening  Have you experienced the following symptoms:  Cough yes/no: No Fever (>100.73F)  yes/no: No Runny nose yes/no: No Sore throat yes/no: No Difficulty breathing/shortness of breath  yes/no: No  Have you or a family member traveled in the last 14 days and where? yes/no: No   If the patient indicates "YES" to the above questions, their PAT will be rescheduled to limit the exposure to others and, the surgeon will be notified. THE PATIENT WILL NEED TO BE ASYMPTOMATIC FOR 14 DAYS.   If the patient is not experiencing any of these symptoms, the PAT nurse will instruct them to NOT bring anyone with them to their appointment since they may have these symptoms or traveled as well.   Please remind your patients and families that hospital visitation restrictions are in effect and the importance of the restrictions.     Anesthesia review: No  Patient denies shortness of breath, fever, cough and chest pain at PAT appointment   All instructions explained to the patient, with a verbal understanding of the material. Patient agrees to go over the instructions while at home for a better understanding. Patient also instructed to self quarantine after being tested for COVID-19. The opportunity to ask questions was provided.

## 2020-03-10 ENCOUNTER — Inpatient Hospital Stay (HOSPITAL_COMMUNITY): Payer: Medicaid Other | Admitting: Anesthesiology

## 2020-03-10 ENCOUNTER — Encounter (HOSPITAL_COMMUNITY)
Admission: RE | Disposition: A | Discharge status: Home / Self Care | Payer: Self-pay | Source: Home / Self Care | Attending: Neurosurgery

## 2020-03-10 ENCOUNTER — Other Ambulatory Visit: Payer: Self-pay

## 2020-03-10 ENCOUNTER — Ambulatory Visit (HOSPITAL_COMMUNITY)
Admission: RE | Admit: 2020-03-10 | Discharge: 2020-03-10 | Disposition: A | Discharge status: Home / Self Care | Payer: Medicaid Other | Attending: Neurosurgery | Admitting: Neurosurgery

## 2020-03-10 ENCOUNTER — Encounter (HOSPITAL_COMMUNITY): Payer: Self-pay | Admitting: Neurosurgery

## 2020-03-10 DIAGNOSIS — X58XXXA Exposure to other specified factors, initial encounter: Secondary | ICD-10-CM | POA: Diagnosis not present

## 2020-03-10 DIAGNOSIS — E119 Type 2 diabetes mellitus without complications: Secondary | ICD-10-CM | POA: Insufficient documentation

## 2020-03-10 DIAGNOSIS — J449 Chronic obstructive pulmonary disease, unspecified: Secondary | ICD-10-CM | POA: Insufficient documentation

## 2020-03-10 DIAGNOSIS — Z79899 Other long term (current) drug therapy: Secondary | ICD-10-CM | POA: Insufficient documentation

## 2020-03-10 DIAGNOSIS — T8189XA Other complications of procedures, not elsewhere classified, initial encounter: Secondary | ICD-10-CM | POA: Diagnosis not present

## 2020-03-10 DIAGNOSIS — F17210 Nicotine dependence, cigarettes, uncomplicated: Secondary | ICD-10-CM | POA: Diagnosis not present

## 2020-03-10 DIAGNOSIS — I10 Essential (primary) hypertension: Secondary | ICD-10-CM | POA: Insufficient documentation

## 2020-03-10 DIAGNOSIS — Z96642 Presence of left artificial hip joint: Secondary | ICD-10-CM | POA: Diagnosis not present

## 2020-03-10 DIAGNOSIS — Z833 Family history of diabetes mellitus: Secondary | ICD-10-CM | POA: Diagnosis not present

## 2020-03-10 DIAGNOSIS — E079 Disorder of thyroid, unspecified: Secondary | ICD-10-CM | POA: Insufficient documentation

## 2020-03-10 DIAGNOSIS — Z8249 Family history of ischemic heart disease and other diseases of the circulatory system: Secondary | ICD-10-CM | POA: Diagnosis not present

## 2020-03-10 DIAGNOSIS — M1990 Unspecified osteoarthritis, unspecified site: Secondary | ICD-10-CM | POA: Insufficient documentation

## 2020-03-10 DIAGNOSIS — M419 Scoliosis, unspecified: Secondary | ICD-10-CM | POA: Insufficient documentation

## 2020-03-10 DIAGNOSIS — F329 Major depressive disorder, single episode, unspecified: Secondary | ICD-10-CM | POA: Diagnosis not present

## 2020-03-10 DIAGNOSIS — K219 Gastro-esophageal reflux disease without esophagitis: Secondary | ICD-10-CM | POA: Diagnosis not present

## 2020-03-10 HISTORY — PX: LUMBAR WOUND DEBRIDEMENT: SHX1988

## 2020-03-10 LAB — RAPID URINE DRUG SCREEN, HOSP PERFORMED
Amphetamines: NOT DETECTED
Barbiturates: NOT DETECTED
Benzodiazepines: NOT DETECTED
Cocaine: NOT DETECTED
Opiates: NOT DETECTED
Tetrahydrocannabinol: NOT DETECTED

## 2020-03-10 SURGERY — LUMBAR WOUND DEBRIDEMENT
Anesthesia: General | Site: Spine Lumbar

## 2020-03-10 MED ORDER — HYDROMORPHONE HCL 1 MG/ML IJ SOLN
INTRAMUSCULAR | Status: AC
  Filled 2020-03-10: qty 1

## 2020-03-10 MED ORDER — SUGAMMADEX SODIUM 200 MG/2ML IV SOLN
INTRAVENOUS | Status: DC | PRN
  Administered 2020-03-10: 400 mg via INTRAVENOUS

## 2020-03-10 MED ORDER — THROMBIN 20000 UNITS EX SOLR
CUTANEOUS | Status: AC
  Filled 2020-03-10: qty 20000

## 2020-03-10 MED ORDER — PHENYLEPHRINE 40 MCG/ML (10ML) SYRINGE FOR IV PUSH (FOR BLOOD PRESSURE SUPPORT)
PREFILLED_SYRINGE | INTRAVENOUS | Status: DC | PRN
  Administered 2020-03-10: 120 ug via INTRAVENOUS
  Administered 2020-03-10: 160 ug via INTRAVENOUS
  Administered 2020-03-10: 120 ug via INTRAVENOUS

## 2020-03-10 MED ORDER — ROCURONIUM BROMIDE 10 MG/ML (PF) SYRINGE
PREFILLED_SYRINGE | INTRAVENOUS | Status: DC | PRN
  Administered 2020-03-10: 45 mg via INTRAVENOUS
  Administered 2020-03-10: 30 mg via INTRAVENOUS

## 2020-03-10 MED ORDER — 0.9 % SODIUM CHLORIDE (POUR BTL) OPTIME
TOPICAL | Status: DC | PRN
  Administered 2020-03-10: 1000 mL

## 2020-03-10 MED ORDER — ONDANSETRON HCL 4 MG/2ML IJ SOLN
INTRAMUSCULAR | Status: DC | PRN
  Administered 2020-03-10: 4 mg via INTRAVENOUS

## 2020-03-10 MED ORDER — DEXAMETHASONE SODIUM PHOSPHATE 10 MG/ML IJ SOLN
INTRAMUSCULAR | Status: AC
  Filled 2020-03-10: qty 1

## 2020-03-10 MED ORDER — LACTATED RINGERS IV SOLN: INTRAVENOUS | Status: DC | PRN

## 2020-03-10 MED ORDER — LIDOCAINE-EPINEPHRINE 0.5 %-1:200000 IJ SOLN
INTRAMUSCULAR | Status: AC
  Filled 2020-03-10: qty 1

## 2020-03-10 MED ORDER — ARTIFICIAL TEARS OPHTHALMIC OINT
TOPICAL_OINTMENT | OPHTHALMIC | Status: DC | PRN
Start: 2020-03-10 — End: 2020-03-10
  Administered 2020-03-10: 1 via OPHTHALMIC

## 2020-03-10 MED ORDER — PROPOFOL 10 MG/ML IV BOLUS
INTRAVENOUS | Status: AC
  Filled 2020-03-10: qty 20

## 2020-03-10 MED ORDER — MIDAZOLAM HCL 2 MG/2ML IJ SOLN
INTRAMUSCULAR | Status: DC | PRN
  Administered 2020-03-10: 2 mg via INTRAVENOUS

## 2020-03-10 MED ORDER — LIDOCAINE 2% (20 MG/ML) 5 ML SYRINGE
INTRAMUSCULAR | Status: DC | PRN
  Administered 2020-03-10: 60 mg via INTRAVENOUS

## 2020-03-10 MED ORDER — THROMBIN 5000 UNITS EX SOLR
CUTANEOUS | Status: AC
  Filled 2020-03-10: qty 10000

## 2020-03-10 MED ORDER — ONDANSETRON HCL 4 MG/2ML IJ SOLN
INTRAMUSCULAR | Status: AC
  Filled 2020-03-10: qty 2

## 2020-03-10 MED ORDER — PROPOFOL 10 MG/ML IV BOLUS
INTRAVENOUS | Status: DC | PRN
  Administered 2020-03-10: 200 mg via INTRAVENOUS

## 2020-03-10 MED ORDER — MIDAZOLAM HCL 2 MG/2ML IJ SOLN: 0.5000 mg | Freq: Once | INTRAMUSCULAR | Status: DC | PRN

## 2020-03-10 MED ORDER — FENTANYL CITRATE (PF) 250 MCG/5ML IJ SOLN
INTRAMUSCULAR | Status: DC | PRN
  Administered 2020-03-10 (×2): 50 ug via INTRAVENOUS
  Administered 2020-03-10: 100 ug via INTRAVENOUS

## 2020-03-10 MED ORDER — PROMETHAZINE HCL 25 MG/ML IJ SOLN: 6.2500 mg | INTRAMUSCULAR | Status: DC | PRN

## 2020-03-10 MED ORDER — MIDAZOLAM HCL 2 MG/2ML IJ SOLN
INTRAMUSCULAR | Status: AC
  Filled 2020-03-10: qty 2

## 2020-03-10 MED ORDER — LIDOCAINE-EPINEPHRINE 0.5 %-1:200000 IJ SOLN
INTRAMUSCULAR | Status: DC | PRN
  Administered 2020-03-10: 30 mL

## 2020-03-10 MED ORDER — HYDROMORPHONE HCL 1 MG/ML IJ SOLN
0.2500 mg | INTRAMUSCULAR | Status: DC | PRN
  Administered 2020-03-10 (×2): 0.5 mg via INTRAVENOUS

## 2020-03-10 MED ORDER — FENTANYL CITRATE (PF) 250 MCG/5ML IJ SOLN
INTRAMUSCULAR | Status: AC
  Filled 2020-03-10: qty 5

## 2020-03-10 MED ORDER — CEFAZOLIN SODIUM-DEXTROSE 2-4 GM/100ML-% IV SOLN
2.0000 g | INTRAVENOUS | Status: AC
  Administered 2020-03-10: 2 g via INTRAVENOUS
  Filled 2020-03-10: qty 100

## 2020-03-10 MED ORDER — BUPIVACAINE HCL (PF) 0.5 % IJ SOLN
INTRAMUSCULAR | Status: AC
  Filled 2020-03-10: qty 30

## 2020-03-10 MED ORDER — CHLORHEXIDINE GLUCONATE CLOTH 2 % EX PADS: 6.0000 | MEDICATED_PAD | Freq: Once | CUTANEOUS | Status: DC

## 2020-03-10 MED ORDER — MEPERIDINE HCL 25 MG/ML IJ SOLN: 6.2500 mg | INTRAMUSCULAR | Status: DC | PRN

## 2020-03-10 MED ORDER — LIDOCAINE 2% (20 MG/ML) 5 ML SYRINGE
INTRAMUSCULAR | Status: AC
  Filled 2020-03-10: qty 5

## 2020-03-10 MED ORDER — THROMBIN 20000 UNITS EX SOLR
CUTANEOUS | Status: DC | PRN
  Administered 2020-03-10: 20 mL via TOPICAL

## 2020-03-10 MED ORDER — LACTATED RINGERS IV SOLN: INTRAVENOUS | Status: DC

## 2020-03-10 MED ORDER — SUCCINYLCHOLINE CHLORIDE 200 MG/10ML IV SOSY
PREFILLED_SYRINGE | INTRAVENOUS | Status: DC | PRN
  Administered 2020-03-10: 100 mg via INTRAVENOUS

## 2020-03-10 SURGICAL SUPPLY — 53 items
BENZOIN TINCTURE PRP APPL 2/3 (GAUZE/BANDAGES/DRESSINGS) IMPLANT
BLADE CLIPPER SURG (BLADE) IMPLANT
BUR MATCHSTICK NEURO 3.0 LAGG (BURR) IMPLANT
BUR PRECISION FLUTE 5.0 (BURR) IMPLANT
CANISTER SUCT 3000ML PPV (MISCELLANEOUS) ×4 IMPLANT
CARTRIDGE OIL MAESTRO DRILL (MISCELLANEOUS) IMPLANT
CLOSURE WOUND 1/2 X4 (GAUZE/BANDAGES/DRESSINGS)
CNTNR URN SCR LID CUP LEK RST (MISCELLANEOUS) ×2 IMPLANT
CONT SPEC 4OZ STRL OR WHT (MISCELLANEOUS) ×2
COVER BACK TABLE 60X90IN (DRAPES) ×4 IMPLANT
COVER WAND RF STERILE (DRAPES) IMPLANT
DECANTER SPIKE VIAL GLASS SM (MISCELLANEOUS) ×4 IMPLANT
DERMABOND ADVANCED (GAUZE/BANDAGES/DRESSINGS) ×2
DERMABOND ADVANCED .7 DNX12 (GAUZE/BANDAGES/DRESSINGS) ×2 IMPLANT
DIFFUSER DRILL AIR PNEUMATIC (MISCELLANEOUS) IMPLANT
DRAPE C-ARM 42X72 X-RAY (DRAPES) IMPLANT
DRAPE C-ARMOR (DRAPES) IMPLANT
DRAPE LAPAROTOMY 100X72X124 (DRAPES) ×4 IMPLANT
DRAPE SURG 17X23 STRL (DRAPES) ×4 IMPLANT
DURAPREP 26ML APPLICATOR (WOUND CARE) ×4 IMPLANT
ELECT REM PT RETURN 9FT ADLT (ELECTROSURGICAL) ×4
ELECTRODE REM PT RTRN 9FT ADLT (ELECTROSURGICAL) ×2 IMPLANT
GAUZE 4X4 16PLY RFD (DISPOSABLE) IMPLANT
GAUZE SPONGE 4X4 12PLY STRL (GAUZE/BANDAGES/DRESSINGS) IMPLANT
GLOVE ECLIPSE 6.5 STRL STRAW (GLOVE) ×8 IMPLANT
GLOVE EXAM NITRILE XL STR (GLOVE) IMPLANT
GOWN STRL REUS W/ TWL LRG LVL3 (GOWN DISPOSABLE) ×4 IMPLANT
GOWN STRL REUS W/ TWL XL LVL3 (GOWN DISPOSABLE) IMPLANT
GOWN STRL REUS W/TWL 2XL LVL3 (GOWN DISPOSABLE) ×4 IMPLANT
GOWN STRL REUS W/TWL LRG LVL3 (GOWN DISPOSABLE) ×4
GOWN STRL REUS W/TWL XL LVL3 (GOWN DISPOSABLE)
KIT BASIN OR (CUSTOM PROCEDURE TRAY) ×4 IMPLANT
KIT POSITION SURG JACKSON T1 (MISCELLANEOUS) ×4 IMPLANT
KIT TURNOVER KIT B (KITS) ×4 IMPLANT
MILL MEDIUM DISP (BLADE) IMPLANT
NEEDLE HYPO 25X1 1.5 SAFETY (NEEDLE) ×4 IMPLANT
NEEDLE SPNL 18GX3.5 QUINCKE PK (NEEDLE) IMPLANT
NS IRRIG 1000ML POUR BTL (IV SOLUTION) ×4 IMPLANT
OIL CARTRIDGE MAESTRO DRILL (MISCELLANEOUS)
PACK LAMINECTOMY NEURO (CUSTOM PROCEDURE TRAY) ×4 IMPLANT
PAD ARMBOARD 7.5X6 YLW CONV (MISCELLANEOUS) ×12 IMPLANT
SPONGE LAP 4X18 RFD (DISPOSABLE) IMPLANT
SPONGE SURGIFOAM ABS GEL 100 (HEMOSTASIS) IMPLANT
STRIP CLOSURE SKIN 1/2X4 (GAUZE/BANDAGES/DRESSINGS) IMPLANT
SUT PROLENE 6 0 BV (SUTURE) IMPLANT
SUT VIC AB 0 CT1 18XCR BRD8 (SUTURE) ×2 IMPLANT
SUT VIC AB 0 CT1 8-18 (SUTURE) ×2
SUT VIC AB 2-0 CT1 18 (SUTURE) ×4 IMPLANT
SUT VIC AB 3-0 SH 8-18 (SUTURE) ×4 IMPLANT
TOWEL GREEN STERILE (TOWEL DISPOSABLE) ×4 IMPLANT
TOWEL GREEN STERILE FF (TOWEL DISPOSABLE) ×4 IMPLANT
TRAY FOLEY MTR SLVR 16FR STAT (SET/KITS/TRAYS/PACK) ×4 IMPLANT
WATER STERILE IRR 1000ML POUR (IV SOLUTION) ×4 IMPLANT

## 2020-03-10 NOTE — Anesthesia Postprocedure Evaluation (Signed)
Anesthesia Post Note  Patient: Alicia Blake  Procedure(s) Performed: Lumbar Wound Debridement (N/A Spine Lumbar)     Patient location during evaluation: PACU Anesthesia Type: General Level of consciousness: awake and alert, oriented and patient cooperative Pain management: pain level controlled (pain improving) Vital Signs Assessment: post-procedure vital signs reviewed and stable Respiratory status: spontaneous breathing, nonlabored ventilation and respiratory function stable Cardiovascular status: blood pressure returned to baseline and stable Postop Assessment: no apparent nausea or vomiting Anesthetic complications: no    Last Vitals:  Vitals:   03/10/20 1600 03/10/20 1608  BP:  (!) 144/85  Pulse:    Resp: 14 13  Temp:    SpO2:      Last Pain:  Vitals:   03/10/20 1545  TempSrc:   PainSc: 10-Worst pain ever                 Phi Avans,E. Vernecia Umble

## 2020-03-10 NOTE — Anesthesia Preprocedure Evaluation (Addendum)
Anesthesia Evaluation  Patient identified by MRN, date of birth, ID band Patient awake    Reviewed: Allergy & Precautions, NPO status , Patient's Chart, lab work & pertinent test results  History of Anesthesia Complications Negative for: history of anesthetic complications  Airway Mallampati: I  TM Distance: >3 FB Neck ROM: Full    Dental  (+) Dental Advisory Given   Pulmonary COPD,  COPD inhaler, Current Smoker and Patient abstained from smoking.,  03/07/2020 SARS coronavirus NEG   breath sounds clear to auscultation       Cardiovascular hypertension, Pt. on medications (-) angina Rhythm:Regular Rate:Normal  '17 ECHO: Normal LV size and systolic function, EF 0000000. Normal RV size and systolic function. No significant valvular abnormalities   Neuro/Psych PSYCHIATRIC DISORDERS (PTSD) Anxiety Depression Bipolar Disorder Schizophrenia Chronic back pain    GI/Hepatic GERD  Medicated and Controlled,(+)     substance abuse (h/o cocaine, cannabis, ETOH abuse)  ,   Endo/Other  Morbid obesity  Renal/GU negative Renal ROS     Musculoskeletal   Abdominal (+) + obese,   Peds  Hematology negative hematology ROS (+)   Anesthesia Other Findings   Reproductive/Obstetrics                            Anesthesia Physical Anesthesia Plan  ASA: III  Anesthesia Plan: General   Post-op Pain Management:    Induction: Intravenous  PONV Risk Score and Plan: 2 and Ondansetron and Dexamethasone  Airway Management Planned: Oral ETT  Additional Equipment: None  Intra-op Plan:   Post-operative Plan: Extubation in OR  Informed Consent: I have reviewed the patients History and Physical, chart, labs and discussed the procedure including the risks, benefits and alternatives for the proposed anesthesia with the patient or authorized representative who has indicated his/her understanding and acceptance.      Dental advisory given  Plan Discussed with: CRNA and Surgeon  Anesthesia Plan Comments:        Anesthesia Quick Evaluation

## 2020-03-10 NOTE — H&P (Signed)
BP 123/75   Pulse 88   Temp 97.6 F (36.4 C) (Temporal)   Resp 18   Ht 5\' 6"  (1.676 m)   Wt 110.2 kg   LMP 12/09/2015 Comment: very irregular  SpO2 100%   BMI 39.22 kg/m  Mrs. Allan returns with an MRI of the lumbar spine, which shows significant spondylitic change present on the left side.  What I do believe is that she needs to have the facet taken down, which is edematous on the MRI.  The disc is markedly degenerated, and to only have an operation done from the left side, as the right side is not posing any problem here, where she had a previous operation at 4-5 on the right.  It still looks fine.  Some slight narrowing, but this is not the problem.  The problem is clearly at 5-1 on the left.  Risks and benefits of bleeding, infection, no relief, need for further surgery, fusion failure, hardware failure, damage to the nerve roots were all discussed, along with other risks.  I gave her a detailed instruction sheet.  Mrs. Georgiou would like to proceed with operative decompression of the left L5 nerve root with subsequent pedicle screw fixation since the facet, again which is edematous, and also the facet needs to be taken down to decompress that root.    Allergies  Allergen Reactions  . Risperidone And Related Swelling and Other (See Comments)    Facial swelling  . Buspirone Swelling  . Meloxicam Other (See Comments)    UNABLE TO REMEMBER  . Prednisone Other (See Comments)    She reported history of mouth swelling with prednisone  . Gabapentin Nausea And Vomiting and Swelling    Speech impairment  Patient denies allergy    Past Medical History:  Diagnosis Date  . Asthma   . Bipolar affect, depressed ([place])   . Bronchitis   . Chronic back pain   . COPD (chronic obstructive pulmonary disease) ([place])   . DJD (degenerative joint disease)    BACK  . GERD (gastroesophageal reflux disease)   . Hypertension   . Pneumonia   . Pre-diabetes    dr entered diabetes without complications-  no meds does not test sugar at home  . PTSD (post-traumatic stress disorder)   . Scoliosis   . Seizures ([place])    "when drinking" last yrs ago  . Shortness of breath dyspnea    exersion  . Thyroid disease   . UTI (lower urinary tract infection)    Past Surgical History:  Procedure Laterality Date  . 2 LEFT TOES SURGERY  12/2014   finished only on right side  . BACK SURGERY    . BALLOON DILATION N/A 12/14/2015   Procedure: BALLOON DILATION;  Surgeon: Teena Irani, MD;  Location: [facility];  Service: Endoscopy;  Laterality: N/A;  . COLONOSCOPY WITH PROPOFOL N/A 12/14/2015   Procedure: COLONOSCOPY WITH PROPOFOL;  Surgeon: Teena Irani, MD;  Location: [facility];  Service: Endoscopy;  Laterality: N/A;  . ESOPHAGOGASTRODUODENOSCOPY (EGD) WITH PROPOFOL N/A 12/14/2015   Procedure: ESOPHAGOGASTRODUODENOSCOPY (EGD) WITH PROPOFOL;  Surgeon: Teena Irani, MD;  Location: [facility];  Service: Endoscopy;  Laterality: N/A;  . JOINT REPLACEMENT     left hip  . LEFT ANKLE SURGERY  MARCH 2016  . LUMBAR LAMINECTOMY/DECOMPRESSION MICRODISCECTOMY N/A 06/18/2017   Procedure: LAMINECTOMY AND FORAMINOTOMY LUMBAR FOUR - LUMBAR FIVE;  Surgeon: Ashok Pall, MD;  Location: [facility];  Service: Neurosurgery;  Laterality: N/A;  LAMINECTOMY AND FORAMINOTOMY LUMBAR  4- LUMBAR 5  . NECK SURGERY  AS TEENAGER   STITCHES TO NECK   . SURGERY FOR CUT ON STOMACH  TEENAGER  . TOTAL HIP ARTHROPLASTY Left 05/14/2016   Procedure: TOTAL HIP ARTHROPLASTY ANTERIOR APPROACH;  Surgeon: Renette Butters, MD;  Location: [facility];  Service: Orthopedics;  Laterality: Left;   Social History   Socioeconomic History  . Marital status: Single    Spouse name: Not on file  . Number of children: Not on file  . Years of education: Not on file  . Highest education level: Not on file  Occupational History  . Not on file  Tobacco Use  . Smoking status: Current Every Day Smoker    Packs/day: 2.00    Years: 26.00    Pack years: 52.00    Types:  Cigarettes  . Smokeless tobacco: Never Used  Substance and Sexual Activity  . Alcohol use: No    Comment: quit date 01/17/2013  . Drug use: No    Types: "Crack" cocaine, Heroin    Comment: quit date 04-17-2014 FOR CRACK  . Sexual activity: Yes    Birth control/protection: None  Other Topics Concern  . Not on file  Social History Narrative  . Not on file   Social Determinants of Health   Financial Resource Strain:   . Difficulty of Paying Living Expenses:   Food Insecurity:   . Worried About Charity fundraiser in the Last Year:   . Arboriculturist in the Last Year:   Transportation Needs:   . Film/video editor (Medical):   Marland Kitchen Lack of Transportation (Non-Medical):   Physical Activity:   . Days of Exercise per Week:   . Minutes of Exercise per Session:   Stress:   . Feeling of Stress :   Social Connections:   . Frequency of Communication with Friends and Family:   . Frequency of Social Gatherings with Friends and Family:   . Attends Religious Services:   . Active Member of Clubs or Organizations:   . Attends Archivist Meetings:   Marland Kitchen Marital Status:   Intimate Partner Violence:   . Fear of Current or Ex-Partner:   . Emotionally Abused:   Marland Kitchen Physically Abused:   . Sexually Abused:    Family History  Problem Relation Age of Onset  . Hypertension Mother   . Hypertension Maternal Grandmother   . Hypertension Paternal Grandmother   . Diabetes Paternal Grandmother   . Hypertension Paternal Grandfather   . Diabetes Paternal Grandfather   . Alcohol abuse Paternal Aunt   . Mental illness Cousin   . Heart attack Cousin   . Heart attack Maternal Uncle    Prior to Admission medications   Medication Sig Start Date End Date Taking? Authorizing Provider  ABILIFY MAINTENA 400 MG PRSY prefilled syringe Inject 400 mg into the muscle every 30 (thirty) days. 08/17/19  Yes Elbert Ewings, FNP  albuterol (PROVENTIL) (2.5 MG/3ML) 0.083% nebulizer solution Take 2.5 mg by  nebulization every 6 (six) hours as needed for wheezing or shortness of breath.   Yes [provider]  albuterol (VENTOLIN HFA) 108 (90 Base) MCG/ACT inhaler Inhale 2 puffs into the lungs every 6 (six) hours as needed for wheezing. 10/11/19  Yes Lamptey, Myrene Galas, MD  Calcium Carb-Cholecalciferol (CALCIUM 600/VITAMIN D3 PO) Take 1 tablet by mouth daily.   Yes [provider]  cetirizine (ZYRTEC) 10 MG tablet Take 10 mg by mouth daily. 11/02/19  Yes  [provider]  fluticasone (FLONASE) 50 MCG/ACT nasal spray Place 1 spray into both nostrils daily.    Yes [provider]  furosemide (LASIX) 20 MG tablet Take 20-60 mg by mouth daily as needed for fluid or edema.   Yes [provider]  hydrOXYzine (VISTARIL) 25 MG capsule Take 25 mg by mouth 3 (three) times daily. 06/16/19  Yes [provider]  lithium carbonate 300 MG capsule Take 600 mg by mouth at bedtime.  09/18/19  Yes [provider]  losartan (COZAAR) 25 MG tablet Take 25 mg by mouth daily. 09/18/19  Yes Trey Sailors, PA  Multiple Vitamin (MULTIVITAMIN) tablet Take 1 tablet by mouth daily.   Yes [provider]  PARoxetine (PAXIL) 40 MG tablet Take 40 mg by mouth daily.   Yes [provider]  QUEtiapine (SEROQUEL) 200 MG tablet Take 200 mg by mouth at bedtime.    Yes [provider]  TEGRETOL 200 MG tablet Take 400 mg by mouth in the morning and at bedtime.  09/20/19  Yes [provider]  tiZANidine (ZANAFLEX) 4 MG tablet Take 4 mg by mouth every 8 (eight) hours as needed for muscle spasms.    Yes [provider]  triamcinolone cream (KENALOG) 0.5 % Apply 1 application topically 2 (two) times daily as needed (irritation).   Yes [provider]  vitamin B-12 (CYANOCOBALAMIN) 500 MCG tablet Take 500 mcg by mouth daily.   Yes [provider]  atomoxetine (STRATTERA) 40 MG capsule Take 40 mg by mouth daily. 09/18/19    Elbert Ewings, FNP  dicyclomine (BENTYL) 20 MG tablet Take 20 mg by mouth 4 (four) times daily. 02/25/20   [provider]  esomeprazole (NEXIUM) 40 MG capsule Take 40 mg by mouth daily. 09/22/19   [provider]  ibuprofen (ADVIL) 800 MG tablet Take 800 mg by mouth every 8 (eight) hours as needed for moderate pain.    [provider]  ondansetron (ZOFRAN) 4 MG tablet Take 4 mg by mouth every 6 (six) hours as needed for nausea or vomiting.    [provider]   Ms, Sellers today was alert, oriented by 4. She answered all questions appropriately.  Memory, language, attention span, and fund of knowledge are normal. Speech is clear and fluent.  Hearing is intact to voice.  She has symmetric facial movements, symmetric facial sensation.  Tongue protrudes in the midline.  Uvula elevates in midline.  Shoulder shrug is normal.  She has normal muscle tone, bulk, and coordination.  Strength is full in the lower and upper extremities.  Gait is mildly antalgic.    Mrs. Schalow was scheduled for a PLIF at L5-S1.  She came to the operating room and was found to have illegal drugs in her urine sample.  I canceled the case.  I did not believe at that time that I enough had faith in her.  She had been admitted to the hospital for an overdose in January.  I had never been informed about it and she continued to request and I continued to prescribe medication.  Given this significant breach of doctor-patient relationship, I had her come in today before we would reschedule.  I am going to have her undergo a drug test today, and she will undergo drug test just prior to the operation.  If she still needs the operation, and we will get that scheduled.

## 2020-03-10 NOTE — Transfer of Care (Signed)
Immediate Anesthesia Transfer of Care Note  Patient: Alicia Blake  Procedure(s) Performed: Lumbar Wound Debridement (N/A Spine Lumbar)  Patient Location: PACU  Anesthesia Type:General  Level of Consciousness: drowsy  Airway & Oxygen Therapy: Patient Spontanous Breathing and Patient connected to face mask oxygen  Post-op Assessment: Report given to RN and Post -op Vital signs reviewed and stable  Post vital signs: Reviewed and stable  Last Vitals:  Vitals Value Taken Time  BP 148/75 03/10/20 1513  Temp    Pulse 87 03/10/20 1515  Resp 18 03/10/20 1516  SpO2 100 % 03/10/20 1515  Vitals shown include unvalidated device data.  Last Pain:  Vitals:   03/10/20 0929  TempSrc:   PainSc: 9       Patients Stated Pain Goal: 2 ([id number])  Complications: No apparent anesthesia complications

## 2020-03-10 NOTE — Anesthesia Procedure Notes (Signed)
Procedure Name: Intubation Date/Time: 03/10/2020 1:34 PM Performed by: Bryson Corona, CRNA Pre-anesthesia Checklist: Patient identified, Emergency Drugs available, Suction available and Patient being monitored Patient Re-evaluated:Patient Re-evaluated prior to induction Oxygen Delivery Method: Circle System Utilized Preoxygenation: Pre-oxygenation with 100% oxygen Induction Type: IV induction Ventilation: Mask ventilation without difficulty Laryngoscope Size: Glidescope and 3 Grade View: Grade I Tube type: Oral Tube size: 7.0 mm Number of attempts: 1 Airway Equipment and Method: Stylet and Oral airway Placement Confirmation: ETT inserted through vocal cords under direct vision,  positive ETCO2 and breath sounds checked- equal and bilateral Secured at: 22 cm Tube secured with: Tape Dental Injury: Teeth and Oropharynx as per pre-operative assessment

## 2020-03-10 NOTE — Op Note (Signed)
BP 123/75   Pulse 88   Temp 97.6 F (36.4 C) (Temporal)   Resp 18   Ht 5\' 6"  (1.676 m)   Wt 110.2 kg   LMP 12/09/2015 Comment: very irregular  SpO2 100%   BMI 39.22 kg/m  03/10/2020  3:26 PM  PATIENT:  Alicia Blake  54 y.o. female admitted for a lumbar fusion. She had however a chronically open draining wound at her old incision(lumbar surgery 2018). I decided to excise the scar and hold on the fusion given the risk of possible infection.   PRE-OPERATIVE DIAGNOSIS:  Non healing wound  POST-OPERATIVE DIAGNOSIS:  Draining wound  PROCEDURE:  Procedure(s): Lumbar Wound Debridement, wound revision  SURGEON: Surgeon(s): Ashok Pall, MD  ASSISTANTS:none  ANESTHESIA:   general  EBL:  Total I/O In: 1000 [I.V.:1000] Out: 50 [Blood:50]  BLOOD ADMINISTERED:none  CELL SAVER GIVEN:none  COUNT:per nursing  DRAINS: none   SPECIMEN:  Source of Specimen:  lumbar wound scaR  DICTATION: [name] was taken to the operating room, intubated, and placed under a general anesthetic without difficulty. She  was positioned prone on the [place] table with all pressure points properly padded. Her lumbar scar was prepped and draped in a sterile manner.  I made an elliptical incision encompassing the old heaped scar which was open. I elevated the planned incised scar and dissected the base with monopolar cautery. I removed then entirety of the abnormal region . The specimen was sent to microbilogy. I closed the scar with vicryl sutures. I placed a sterile dressing. She was then moved from the [place] to the or stretcher and extubated.   PLAN OF CARE: Discharge to home after PACU  PATIENT DISPOSITION:  PACU - hemodynamically stable.   Delay start of Pharmacological VTE agent (>24hrs) due to surgical blood loss or risk of bleeding:  not applicable

## 2020-03-16 LAB — AEROBIC/ANAEROBIC CULTURE W GRAM STAIN (SURGICAL/DEEP WOUND)
Culture: NO GROWTH
Gram Stain: NONE SEEN

## 2020-03-31 ENCOUNTER — Other Ambulatory Visit: Payer: Self-pay | Admitting: Neurosurgery

## 2020-04-18 ENCOUNTER — Encounter (HOSPITAL_COMMUNITY)
Admission: RE | Admit: 2020-04-18 | Discharge: 2020-04-18 | Disposition: A | Discharge status: Home / Self Care | Payer: Medicaid Other | Source: Ambulatory Visit | Attending: Neurosurgery | Admitting: Neurosurgery

## 2020-04-18 ENCOUNTER — Other Ambulatory Visit (HOSPITAL_COMMUNITY)
Admission: RE | Admit: 2020-04-18 | Discharge: 2020-04-18 | Disposition: A | Discharge status: Home / Self Care | Payer: Medicaid Other | Source: Ambulatory Visit | Attending: Neurosurgery | Admitting: Neurosurgery

## 2020-04-18 ENCOUNTER — Encounter (HOSPITAL_COMMUNITY): Payer: Self-pay

## 2020-04-18 ENCOUNTER — Other Ambulatory Visit: Payer: Self-pay

## 2020-04-18 DIAGNOSIS — F319 Bipolar disorder, unspecified: Secondary | ICD-10-CM | POA: Diagnosis not present

## 2020-04-18 DIAGNOSIS — I10 Essential (primary) hypertension: Secondary | ICD-10-CM | POA: Insufficient documentation

## 2020-04-18 DIAGNOSIS — Z01812 Encounter for preprocedural laboratory examination: Secondary | ICD-10-CM | POA: Insufficient documentation

## 2020-04-18 DIAGNOSIS — Z79899 Other long term (current) drug therapy: Secondary | ICD-10-CM | POA: Insufficient documentation

## 2020-04-18 DIAGNOSIS — E119 Type 2 diabetes mellitus without complications: Secondary | ICD-10-CM | POA: Insufficient documentation

## 2020-04-18 DIAGNOSIS — J449 Chronic obstructive pulmonary disease, unspecified: Secondary | ICD-10-CM | POA: Insufficient documentation

## 2020-04-18 DIAGNOSIS — Z20822 Contact with and (suspected) exposure to covid-19: Secondary | ICD-10-CM | POA: Insufficient documentation

## 2020-04-18 DIAGNOSIS — F4310 Post-traumatic stress disorder, unspecified: Secondary | ICD-10-CM | POA: Insufficient documentation

## 2020-04-18 DIAGNOSIS — M47817 Spondylosis without myelopathy or radiculopathy, lumbosacral region: Secondary | ICD-10-CM | POA: Insufficient documentation

## 2020-04-18 DIAGNOSIS — Z01818 Encounter for other preprocedural examination: Secondary | ICD-10-CM | POA: Insufficient documentation

## 2020-04-18 DIAGNOSIS — Z7951 Long term (current) use of inhaled steroids: Secondary | ICD-10-CM | POA: Diagnosis not present

## 2020-04-18 DIAGNOSIS — K219 Gastro-esophageal reflux disease without esophagitis: Secondary | ICD-10-CM | POA: Insufficient documentation

## 2020-04-18 DIAGNOSIS — Z96642 Presence of left artificial hip joint: Secondary | ICD-10-CM | POA: Insufficient documentation

## 2020-04-18 LAB — COMPREHENSIVE METABOLIC PANEL
ALT: 13 U/L (ref 0–44)
AST: 14 U/L — ABNORMAL LOW (ref 15–41)
Albumin: 3.2 g/dL — ABNORMAL LOW (ref 3.5–5.0)
Alkaline Phosphatase: 115 U/L (ref 38–126)
Anion gap: 8 (ref 5–15)
BUN: 5 mg/dL — ABNORMAL LOW (ref 6–20)
CO2: 28 mmol/L (ref 22–32)
Calcium: 8.9 mg/dL (ref 8.9–10.3)
Chloride: 104 mmol/L (ref 98–111)
Creatinine, Ser: 0.73 mg/dL (ref 0.44–1.00)
GFR calc Af Amer: >60 mL/min (ref >60)
GFR calc non Af Amer: >60 mL/min (ref >60)
Glucose, Bld: 111 mg/dL — ABNORMAL HIGH (ref 70–99)
Potassium: 3.6 mmol/L (ref 3.5–5.1)
Sodium: 140 mmol/L (ref 135–145)
Total Bilirubin: 0.3 mg/dL (ref 0.3–1.2)
Total Protein: 7.2 g/dL (ref 6.5–8.1)

## 2020-04-18 LAB — SARS CORONAVIRUS 2 (TAT 6-24 HRS): SARS Coronavirus 2: NEGATIVE

## 2020-04-18 LAB — CBC
HCT: 40.5 % (ref 36.0–46.0)
Hemoglobin: 13 g/dL (ref 12.0–15.0)
MCH: 30.7 pg (ref 26.0–34.0)
MCHC: 32.1 g/dL (ref 30.0–36.0)
MCV: 95.5 fL (ref 80.0–100.0)
Platelets: 225 10*3/uL (ref 150–400)
RBC: 4.24 MIL/uL (ref 3.87–5.11)
RDW: 13.6 % (ref 11.5–15.5)
WBC: 7.3 10*3/uL (ref 4.0–10.5)
nRBC: 0 % (ref 0.0–0.2)

## 2020-04-18 LAB — TYPE AND SCREEN
ABO/RH(D): O POS
Antibody Screen: NEGATIVE

## 2020-04-18 LAB — SURGICAL PCR SCREEN
MRSA, PCR: NEGATIVE
Staphylococcus aureus: NEGATIVE

## 2020-04-18 LAB — HEMOGLOBIN A1C
Hgb A1c MFr Bld: 5.6 % (ref 4.8–5.6)
Mean Plasma Glucose: 114.02 mg/dL

## 2020-04-18 LAB — GLUCOSE, CAPILLARY: Glucose-Capillary: 147 mg/dL — ABNORMAL HIGH (ref 70–99)

## 2020-04-18 NOTE — Progress Notes (Signed)
PCP - Trey Sailors, PA-C Cardiologist - Denies  PPM/ICD - Denies  Chest x-ray - 07/16/20 EKG - 11/18/19 Stress Test - 04/25/16 ECHO - 04/25/16 Cardiac Cath - Denies  Sleep Study - Denies  Per patient, she is pre-diabetic, but does not check CBGs.  CBG at PAT appointment was 147. A1C obtained.  Blood Thinner Instructions: N/A Aspirin Instructions: N/A  ERAS Protcol - N/A PRE-SURGERY Ensure or G2- N/A  COVID TEST- 04/18/20   Anesthesia review: Yes, review ECHO and Stress Test.  Patient denies shortness of breath, fever, cough and chest pain at PAT appointment   All instructions explained to the patient, with a verbal understanding of the material. Patient agrees to go over the instructions while at home for a better understanding. Patient also instructed to self quarantine after being tested for COVID-19. The opportunity to ask questions was provided.

## 2020-04-18 NOTE — Pre-Procedure Instructions (Signed)
Alicia Blake  04/18/2020    Your procedure is scheduled on Friday, April 21, 2020 at 9:00 AM.   Report to [facility] Entrance "A" Admitting Office at 7:00 AM.   Call this number if you have problems the morning of surgery: [phone]   Questions prior to day of surgery, please call [phone] between 8 & 4 PM.   Remember:  Do not eat or drink after midnight Thursday, 04/20/20.  Take these medicines the morning of surgery with A SIP OF WATER: Cetirizine (Zyrtec), Dicyclomine (Bentyl), Esomeprazole (Nexium), Hydroxyzine (Vistaril), Paroxetine (Paxil), Tegretol, Tizanidine (Zanaflex) - if needed, Ondansetron (Zofran) - if needed, Flonase nasal spray, Albuterol (Proventil) nebulizer - if needed, Albuterol (Ventolin) inhaler - if needed (bring inhaler with you day of surgery)  Stop NSAIDS (Ibuprofen, Aleve, etc) and Multivitamins as of today prior to surgery. Do not use Aspirin containing products, Herbal medications or Fish Oil prior to surgery.  Do NOT smoke 24 hours prior to surgery.    Do not wear jewelry, make-up or nail polish.  Do not wear lotions, powders, perfumes or deodorant.  Do not shave 48 hours prior to surgery.    Do not bring valuables to the hospital.  [facility] is not responsible for any belongings or valuables.  Contacts, dentures or bridgework may not be worn into surgery.  Leave your suitcase in the car.  After surgery it may be brought to your room.  For patients admitted to the hospital, discharge time will be determined by your treatment team  [facility] - Preparing for Surgery  Before surgery, you can play an important role.  Because skin is not sterile, your skin needs to be as free of germs as possible.  You can reduce the number of germs on you skin by washing with CHG (chlorahexidine gluconate) soap before surgery.  CHG is an antiseptic cleaner which kills germs and bonds with the skin to continue killing germs even after washing.  Oral Hygiene  is also important in reducing the risk of infection.  Remember to brush your teeth with your regular toothpaste the morning of surgery.  Please DO NOT use if you have an allergy to CHG or antibacterial soaps.  If your skin becomes reddened/irritated stop using the CHG and inform your nurse when you arrive at Short Stay.  Do not shave (including legs and underarms) for at least 48 hours prior to the first CHG shower.  You may shave your face.  Please follow these instructions carefully:   1.  Shower with CHG Soap the night before surgery and the morning of Surgery.  2.  If you choose to wash your hair, wash your hair first as usual with your normal shampoo.  3.  After you shampoo, rinse your hair and body thoroughly to remove the shampoo. 4.  Use CHG as you would any other liquid soap.  You can apply chg directly to the skin and wash gently with a      scrungie or washcloth.           5.  Apply the CHG Soap to your body ONLY FROM THE NECK DOWN.   Do not use on open wounds or open sores. Avoid contact with your eyes, ears, mouth and genitals (private parts).  Wash genitals (private parts) with your normal soap - do this prior to using CHG soap.  6.  Wash thoroughly, paying special attention to the area where your surgery will be performed.  7.  Thoroughly rinse your body with warm water from the neck down.  8.  DO NOT shower/wash with your normal soap after using and rinsing off the CHG Soap.  9.  Pat yourself dry with a clean towel.            10.  Wear clean pajamas.            11.  Place clean sheets on your bed the night of your first shower and do not sleep with pets.  Day of Surgery  Shower as above. Do not apply any lotions/deodorants the morning of surgery.   Please wear clean clothes to the hospital. Remember to brush your teeth with toothpaste.   Please read over the fact sheets that you were given.

## 2020-04-19 NOTE — Anesthesia Preprocedure Evaluation (Addendum)
Anesthesia Evaluation  Patient identified by MRN, date of birth, ID band Patient awake    Reviewed: Allergy & Precautions, NPO status , Patient's Chart, lab work & pertinent test results  Airway Mallampati: III  TM Distance: >3 FB Neck ROM: Full    Dental  (+) Teeth Intact   Pulmonary asthma , COPD, Current Smoker and Patient abstained from smoking.,     + decreased breath sounds      Cardiovascular hypertension,  Rhythm:Regular Rate:Normal     Neuro/Psych PSYCHIATRIC DISORDERS Anxiety Depression Bipolar Disorder Schizophrenia    GI/Hepatic Neg liver ROS, GERD  ,  Endo/Other  negative endocrine ROS  Renal/GU negative Renal ROS     Musculoskeletal  (+) Arthritis ,   Abdominal (+) + obese,   Peds  Hematology negative hematology ROS (+)   Anesthesia Other Findings   Reproductive/Obstetrics                           Anesthesia Physical Anesthesia Plan  ASA: III  Anesthesia Plan: General   Post-op Pain Management:    Induction: Intravenous  PONV Risk Score and Plan: 3 and Ondansetron, Dexamethasone and Midazolam  Airway Management Planned: Oral ETT  Additional Equipment: None  Intra-op Plan:   Post-operative Plan: Extubation in OR  Informed Consent: I have reviewed the patients History and Physical, chart, labs and discussed the procedure including the risks, benefits and alternatives for the proposed anesthesia with the patient or authorized representative who has indicated his/her understanding and acceptance.     Dental advisory given  Plan Discussed with: CRNA  Anesthesia Plan Comments: (PAT note written 04/19/2020 by Myra Gianotti, PA-C. )      Anesthesia Quick Evaluation

## 2020-04-19 NOTE — Progress Notes (Signed)
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 She was initially scheduled for this surgery on 02/16/20, but delayed until 03/10/20 because Dr. Christella Noa wanted to readdress expectations given her misuse of opioids within the previous six months. She ultimately presented for lumbar fusion on 03/10/20 but when Dr. Christella Noa assessed her for surgery he noted a chronically open draining wound at her old 2018 lumbar surgery incision site, so she underwent lumbar wound debridement, wound revision/scar excision instead to reduce chance of infection. Lumbar fusion is now scheduled for 04/21/20.  History includes smoking, HTN, DM2(diet controlled), COPD, asthma, exertional dyspnea, Bipolar disorder, PTSD, scoliosis, seizures (with alcohol years ago), GERD, left THA (05/14/16), back surgery (L4-5 laminectomy/foraminotomy 06/19/17). BMI is consistent with obesity.  02/09/20 OSA screening score was 5. Thyroid disease is listed in history, but when I spoke with patient in April 2021, she was unsure about details--she didn't recall ever having to be on medication for abnormal thyroid labs. She also didn't recall being diagnosed with any thyroid nodules.   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 Rapid UDS + cocaine. Discharged home with substance abuse counselor follow-up. (I discussed with patient in April 2021, importance of avoiding illicit drugs prior to surgery and potential for UDS prior to surgery per discretion of surgeon and/or anesthesiologist. As above, April surgery also delayed until May so Dr. Christella Noa could discuss with her as well.)  Presurgical  COVID-19 test negative on 04/18/20. Anesthesia team to evaluate on the day of surgery.    VS: BP 139/86   Pulse 87   Temp 36.9 C (Oral)   Resp 18   Ht 5\' 6"  (1.676 m)   Wt 110 kg   LMP 12/09/2015 Comment: very irregular  SpO2 99%   BMI 39.14 kg/m    PROVIDERS: Trey Sailors, PA is PCP - She was evaluated by cardiologist Dr.Christopher Angelena Form on6/26/17 for evaluation of chest pain and surgical clearance for left THA. She was ultimately cleared for that surgery following stress and echo (see below).   LABS: Labs reviewed: Acceptable for surgery. (all labs ordered are listed, but only abnormal results are displayed)  Labs Reviewed  GLUCOSE, CAPILLARY - Abnormal; Notable for the following components:      Result Value   Glucose-Capillary 147 (*)    All other components within normal limits  COMPREHENSIVE METABOLIC PANEL - Abnormal; Notable for the following components:   Glucose, Bld 111 (*)    BUN 5 (*)    Albumin 3.2 (*)    AST 14 (*)    All other components within normal limits  SURGICAL PCR SCREEN  HEMOGLOBIN A1C  CBC  TYPE AND SCREEN     IMAGES: MRI L-spine 01/06/20 (Report in [facility]/PACS): IMPRESSION: 1. L2-3: Left lateral foraminal to extraforaminal disc protrusion, unchanged since July 2020. Some potential to affect the left L2 nerve, though definite compression is not established. 2. L3-4: Left lateral recess and foraminal narrowing because of asymmetric bulging of the disc in combination with facet degeneration and hypertrophy. No definite nerve compression, similarly. 3. L4-5: Previous right hemilaminectomy and discectomy. Bulging of  the disc. Facet osteophyte formation. Narrowing of both subarticular lateral recesses and neural foramina that could cause potential to affect either or both L5 nerve roots. Similar appearance to the previous study. 4. L5-S1: Endplate osteophytes and bulging of the disc more prominent towards the left. Facet  arthropathy left worse than right. Stenosis of the left subarticular lateral recess and intervertebral foramen on the left that could cause left-sided neural compression. Edematous and enhancing changes of the facet on the left could contribute to low back pain or referred facet syndrome pain. Possible slight worsening since the study of July 2020.   EKG: 11/18/19: SR.    CV: Nuclear stress test 04/25/16:   There was no ST segment deviation noted during stress.  No T wave inversion was noted during stress.  The study is normal.  The left ventricular ejection fraction is hyperdynamic (>65%).  Nuclear stress EF: 72%. 1. No ischemia or infarction by perfusion images.  2. Normal EF and wall motion.   Echo 04/25/16:  Study Conclusions: - Left ventricle: The cavity size was normal. Wall thickness was normal. Systolic function was normal. The estimated ejection fraction was in the range of 55% to 60%. Wall motion was normal; there were no regional wall motion abnormalities. Doppler parameters are consistent with abnormal left ventricular relaxation (grade 1 diastolic dysfunction). - Aortic valve: There was no stenosis. - Mitral valve: There was trivial regurgitation. - Right ventricle: The cavity size was normal. Systolic function was normal. - Pulmonary arteries: PA peak pressure: 27 mm Hg (S). - Inferior vena cava: The vessel was normal in size. The respirophasic diameter changes were in the normal range (= 50%), consistent with normal central venous pressure. - Pericardium, extracardiac: Small circumferential pericardial effusion, no tamponade. Impressions:  - Normal LV size and systolic function, EF 95-09%. Normal RV size  and systolic function. No significant valvular abnormalities.    Past Medical History:  Diagnosis Date  . Asthma   . Bipolar affect, depressed ([place])   . Bronchitis   . Chronic back pain   . COPD (chronic obstructive pulmonary disease) ([place])   . DJD  (degenerative joint disease)    BACK  . GERD (gastroesophageal reflux disease)   . Hypertension   . Pneumonia   . Pre-diabetes    dr entered diabetes without complications- no meds does not test sugar at home  . PTSD (post-traumatic stress disorder)   . Scoliosis   . Seizures ([place])    "when drinking" last yrs ago  . Shortness of breath dyspnea    exersion  . Thyroid disease   . UTI (lower urinary tract infection)     Past Surgical History:  Procedure Laterality Date  . 2 LEFT TOES SURGERY  12/2014   finished only on right side  . BACK SURGERY    . BALLOON DILATION N/A 12/14/2015   Procedure: BALLOON DILATION;  Surgeon: Teena Irani, MD;  Location: [facility];  Service: Endoscopy;  Laterality: N/A;  . COLONOSCOPY WITH PROPOFOL N/A 12/14/2015   Procedure: COLONOSCOPY WITH PROPOFOL;  Surgeon: Teena Irani, MD;  [facility]: [facility];  Service: Endoscopy;  Laterality: N/A;  . ESOPHAGOGASTRODUODENOSCOPY (EGD) WITH PROPOFOL N/A 12/14/2015   Procedure: ESOPHAGOGASTRODUODENOSCOPY (EGD) WITH PROPOFOL;  Surgeon: Teena Irani, MD;  [facility];  Service: Endoscopy;  Laterality: N/A;  . JOINT REPLACEMENT     left hip  . LEFT ANKLE SURGERY  MARCH 2016  . LUMBAR LAMINECTOMY/DECOMPRESSION MICRODISCECTOMY N/A 06/18/2017   Procedure: LAMINECTOMY AND FORAMINOTOMY LUMBAR FOUR -  LUMBAR FIVE;  Surgeon: Ashok Pall, MD;  Location: [place];  Service: Neurosurgery;  Laterality: N/A;  LAMINECTOMY AND FORAMINOTOMY LUMBAR 4- LUMBAR 5  . LUMBAR WOUND DEBRIDEMENT N/A 03/10/2020   Procedure: Lumbar Wound Debridement;  Surgeon: Ashok Pall, MD;  Location: [place];  Service: Neurosurgery;  Laterality: N/A;  posterior  . NECK SURGERY  AS TEENAGER   STITCHES TO NECK   . SURGERY FOR CUT ON STOMACH  TEENAGER  . TOTAL HIP ARTHROPLASTY Left 05/14/2016   Procedure: TOTAL HIP ARTHROPLASTY ANTERIOR APPROACH;  Surgeon: Renette Butters, MD;  Location: [place];  Service: Orthopedics;  Laterality: Left;     MEDICATIONS: . ABILIFY MAINTENA 400 MG PRSY prefilled syringe  . albuterol (PROVENTIL) (2.5 MG/3ML) 0.083% nebulizer solution  . albuterol (VENTOLIN HFA) 108 (90 Base) MCG/ACT inhaler  . atomoxetine (STRATTERA) 40 MG capsule  . Calcium Carb-Cholecalciferol (CALCIUM 600/VITAMIN D3 PO)  . cetirizine (ZYRTEC) 10 MG tablet  . dicyclomine (BENTYL) 20 MG tablet  . esomeprazole (NEXIUM) 40 MG capsule  . fluticasone (FLONASE) 50 MCG/ACT nasal spray  . Fluticasone-Salmeterol (ADVAIR) 100-50 MCG/DOSE AEPB  . furosemide (LASIX) 20 MG tablet  . hydrOXYzine (VISTARIL) 25 MG capsule  . ibuprofen (ADVIL) 800 MG tablet  . lithium carbonate 300 MG capsule  . losartan (COZAAR) 25 MG tablet  . Multiple Vitamin (MULTIVITAMIN) tablet  . ondansetron (ZOFRAN) 4 MG tablet  . PARoxetine (PAXIL) 40 MG tablet  . QUEtiapine (SEROQUEL) 200 MG tablet  . TEGRETOL 200 MG tablet  . tiZANidine (ZANAFLEX) 4 MG tablet  . triamcinolone cream (KENALOG) 0.5 %  . vitamin B-12 (CYANOCOBALAMIN) 500 MCG tablet   No current facility-administered medications for this encounter.    Myra Gianotti, PA-C Surgical Short Stay/Anesthesiology [facility] Phone [phone] [facility] Phone [phone] 04/19/2020 2:21 PM

## 2020-04-21 ENCOUNTER — Other Ambulatory Visit: Payer: Self-pay

## 2020-04-21 ENCOUNTER — Inpatient Hospital Stay (HOSPITAL_COMMUNITY)
Admission: RE | Admit: 2020-04-21 | Discharge: 2020-04-26 | DRG: 455 | Disposition: A | Discharge status: Home Health | Payer: Medicaid Other | Attending: Neurosurgery | Admitting: Neurosurgery

## 2020-04-21 ENCOUNTER — Encounter (HOSPITAL_COMMUNITY)
Admission: RE | Disposition: A | Discharge status: Home Health | Payer: Self-pay | Source: Home / Self Care | Attending: Neurosurgery

## 2020-04-21 ENCOUNTER — Inpatient Hospital Stay (HOSPITAL_COMMUNITY): Payer: Medicaid Other | Admitting: Emergency Medicine

## 2020-04-21 ENCOUNTER — Inpatient Hospital Stay (HOSPITAL_COMMUNITY): Payer: Medicaid Other | Admitting: Certified Registered Nurse Anesthetist

## 2020-04-21 ENCOUNTER — Inpatient Hospital Stay (HOSPITAL_COMMUNITY): Payer: Medicaid Other

## 2020-04-21 ENCOUNTER — Encounter (HOSPITAL_COMMUNITY): Payer: Self-pay | Admitting: Neurosurgery

## 2020-04-21 DIAGNOSIS — M549 Dorsalgia, unspecified: Secondary | ICD-10-CM | POA: Diagnosis present

## 2020-04-21 DIAGNOSIS — M479 Spondylosis, unspecified: Secondary | ICD-10-CM | POA: Diagnosis present

## 2020-04-21 DIAGNOSIS — F4310 Post-traumatic stress disorder, unspecified: Secondary | ICD-10-CM | POA: Diagnosis present

## 2020-04-21 DIAGNOSIS — I10 Essential (primary) hypertension: Secondary | ICD-10-CM | POA: Diagnosis present

## 2020-04-21 DIAGNOSIS — K219 Gastro-esophageal reflux disease without esophagitis: Secondary | ICD-10-CM | POA: Diagnosis present

## 2020-04-21 DIAGNOSIS — Z7951 Long term (current) use of inhaled steroids: Secondary | ICD-10-CM | POA: Diagnosis not present

## 2020-04-21 DIAGNOSIS — M419 Scoliosis, unspecified: Secondary | ICD-10-CM | POA: Diagnosis present

## 2020-04-21 DIAGNOSIS — F17210 Nicotine dependence, cigarettes, uncomplicated: Secondary | ICD-10-CM | POA: Diagnosis present

## 2020-04-21 DIAGNOSIS — Z888 Allergy status to other drugs, medicaments and biological substances status: Secondary | ICD-10-CM | POA: Diagnosis not present

## 2020-04-21 DIAGNOSIS — E669 Obesity, unspecified: Secondary | ICD-10-CM | POA: Diagnosis present

## 2020-04-21 DIAGNOSIS — Z8249 Family history of ischemic heart disease and other diseases of the circulatory system: Secondary | ICD-10-CM | POA: Diagnosis not present

## 2020-04-21 DIAGNOSIS — Z886 Allergy status to analgesic agent status: Secondary | ICD-10-CM | POA: Diagnosis not present

## 2020-04-21 DIAGNOSIS — Z419 Encounter for procedure for purposes other than remedying health state, unspecified: Secondary | ICD-10-CM

## 2020-04-21 DIAGNOSIS — M4807 Spinal stenosis, lumbosacral region: Principal | ICD-10-CM | POA: Diagnosis present

## 2020-04-21 DIAGNOSIS — Z79899 Other long term (current) drug therapy: Secondary | ICD-10-CM | POA: Diagnosis not present

## 2020-04-21 DIAGNOSIS — Z6839 Body mass index (BMI) 39.0-39.9, adult: Secondary | ICD-10-CM | POA: Diagnosis not present

## 2020-04-21 DIAGNOSIS — Z833 Family history of diabetes mellitus: Secondary | ICD-10-CM | POA: Diagnosis not present

## 2020-04-21 DIAGNOSIS — J449 Chronic obstructive pulmonary disease, unspecified: Secondary | ICD-10-CM | POA: Diagnosis present

## 2020-04-21 DIAGNOSIS — G8929 Other chronic pain: Secondary | ICD-10-CM | POA: Diagnosis present

## 2020-04-21 DIAGNOSIS — M48061 Spinal stenosis, lumbar region without neurogenic claudication: Secondary | ICD-10-CM | POA: Diagnosis present

## 2020-04-21 DIAGNOSIS — E119 Type 2 diabetes mellitus without complications: Secondary | ICD-10-CM | POA: Diagnosis present

## 2020-04-21 LAB — GLUCOSE, CAPILLARY
Glucose-Capillary: 111 mg/dL — ABNORMAL HIGH (ref 70–99)
Glucose-Capillary: 176 mg/dL — ABNORMAL HIGH (ref 70–99)

## 2020-04-21 SURGERY — POSTERIOR LUMBAR FUSION 1 LEVEL
Anesthesia: General

## 2020-04-21 MED ORDER — PHENYLEPHRINE HCL-NACL 10-0.9 MG/250ML-% IV SOLN
INTRAVENOUS | Status: DC | PRN
Start: 2020-04-21 — End: 2020-04-21
  Administered 2020-04-21: 25 ug/min via INTRAVENOUS

## 2020-04-21 MED ORDER — ROCURONIUM BROMIDE 10 MG/ML (PF) SYRINGE
PREFILLED_SYRINGE | INTRAVENOUS | Status: DC | PRN
  Administered 2020-04-21: 20 mg via INTRAVENOUS
  Administered 2020-04-21: 30 mg via INTRAVENOUS
  Administered 2020-04-21: 50 mg via INTRAVENOUS

## 2020-04-21 MED ORDER — PROPOFOL 10 MG/ML IV BOLUS
INTRAVENOUS | Status: AC
  Filled 2020-04-21: qty 20

## 2020-04-21 MED ORDER — LACTATED RINGERS IV SOLN: INTRAVENOUS | Status: DC | PRN

## 2020-04-21 MED ORDER — FLUTICASONE PROPIONATE 50 MCG/ACT NA SUSP
1.0000 | Freq: Every day | NASAL | Status: DC
  Administered 2020-04-22 – 2020-04-26 (×5): 1 via NASAL
  Filled 2020-04-21: qty 16

## 2020-04-21 MED ORDER — SUGAMMADEX SODIUM 200 MG/2ML IV SOLN
INTRAVENOUS | Status: DC | PRN
  Administered 2020-04-21: 300 mg via INTRAVENOUS

## 2020-04-21 MED ORDER — SODIUM CHLORIDE 0.9% FLUSH
3.0000 mL | INTRAVENOUS | Status: DC | PRN
  Administered 2020-04-24: 3 mL via INTRAVENOUS

## 2020-04-21 MED ORDER — ONDANSETRON HCL 4 MG PO TABS: 4.0000 mg | ORAL_TABLET | Freq: Four times a day (QID) | ORAL | Status: DC | PRN

## 2020-04-21 MED ORDER — ONDANSETRON HCL 4 MG/2ML IJ SOLN
INTRAMUSCULAR | Status: AC
  Filled 2020-04-21: qty 2

## 2020-04-21 MED ORDER — FENTANYL CITRATE (PF) 250 MCG/5ML IJ SOLN
INTRAMUSCULAR | Status: AC
  Filled 2020-04-21: qty 5

## 2020-04-21 MED ORDER — KETAMINE HCL 10 MG/ML IJ SOLN
INTRAMUSCULAR | Status: DC | PRN
Start: 2020-04-21 — End: 2020-04-21
  Administered 2020-04-21 (×4): 10 mg via INTRAVENOUS
  Administered 2020-04-21: 30 mg via INTRAVENOUS

## 2020-04-21 MED ORDER — PHENYLEPHRINE HCL (PRESSORS) 10 MG/ML IV SOLN
INTRAVENOUS | Status: DC | PRN
Start: 2020-04-21 — End: 2020-04-21
  Administered 2020-04-21: 200 ug via INTRAVENOUS

## 2020-04-21 MED ORDER — DEXAMETHASONE SODIUM PHOSPHATE 10 MG/ML IJ SOLN
INTRAMUSCULAR | Status: AC
  Filled 2020-04-21: qty 1

## 2020-04-21 MED ORDER — 0.9 % SODIUM CHLORIDE (POUR BTL) OPTIME
TOPICAL | Status: DC | PRN
  Administered 2020-04-21: 1000 mL

## 2020-04-21 MED ORDER — ACETAMINOPHEN 325 MG PO TABS: 325.0000 mg | ORAL_TABLET | Freq: Once | ORAL | Status: DC | PRN

## 2020-04-21 MED ORDER — MIDAZOLAM HCL 2 MG/2ML IJ SOLN
INTRAMUSCULAR | Status: AC
  Filled 2020-04-21: qty 2

## 2020-04-21 MED ORDER — DEXAMETHASONE SODIUM PHOSPHATE 10 MG/ML IJ SOLN
INTRAMUSCULAR | Status: DC | PRN
Start: 2020-04-21 — End: 2020-04-21
  Administered 2020-04-21: 10 mg via INTRAVENOUS

## 2020-04-21 MED ORDER — ALBUTEROL SULFATE (2.5 MG/3ML) 0.083% IN NEBU: 2.5000 mg | INHALATION_SOLUTION | Freq: Four times a day (QID) | RESPIRATORY_TRACT | Status: DC | PRN

## 2020-04-21 MED ORDER — DICYCLOMINE HCL 20 MG PO TABS
20.0000 mg | ORAL_TABLET | Freq: Four times a day (QID) | ORAL | Status: DC
  Administered 2020-04-21 – 2020-04-26 (×15): 20 mg via ORAL
  Filled 2020-04-21 (×22): qty 1

## 2020-04-21 MED ORDER — PROMETHAZINE HCL 25 MG/ML IJ SOLN: 6.2500 mg | INTRAMUSCULAR | Status: DC | PRN

## 2020-04-21 MED ORDER — HYDROMORPHONE HCL 1 MG/ML IJ SOLN
0.2500 mg | INTRAMUSCULAR | Status: DC | PRN
  Administered 2020-04-21 (×2): 0.5 mg via INTRAVENOUS

## 2020-04-21 MED ORDER — ARIPIPRAZOLE ER 400 MG IM PRSY: 400.0000 mg | PREFILLED_SYRINGE | INTRAMUSCULAR | Status: DC

## 2020-04-21 MED ORDER — MOMETASONE FURO-FORMOTEROL FUM 100-5 MCG/ACT IN AERO
2.0000 | INHALATION_SPRAY | Freq: Two times a day (BID) | RESPIRATORY_TRACT | Status: DC
  Administered 2020-04-22 – 2020-04-26 (×9): 2 via RESPIRATORY_TRACT
  Filled 2020-04-21: qty 8.8

## 2020-04-21 MED ORDER — LIDOCAINE HCL (CARDIAC) PF 100 MG/5ML IV SOSY
PREFILLED_SYRINGE | INTRAVENOUS | Status: DC | PRN
  Administered 2020-04-21: 30 mg via INTRAVENOUS

## 2020-04-21 MED ORDER — OXYCODONE HCL 5 MG PO TABS
10.0000 mg | ORAL_TABLET | ORAL | Status: DC | PRN
  Administered 2020-04-21 – 2020-04-24 (×7): 10 mg via ORAL
  Filled 2020-04-21 (×7): qty 2

## 2020-04-21 MED ORDER — LIDOCAINE 2% (20 MG/ML) 5 ML SYRINGE
INTRAMUSCULAR | Status: AC
  Filled 2020-04-21: qty 5

## 2020-04-21 MED ORDER — LACTATED RINGERS IV SOLN: INTRAVENOUS | Status: DC

## 2020-04-21 MED ORDER — ACETAMINOPHEN 650 MG RE SUPP: 650.0000 mg | RECTAL | Status: DC | PRN

## 2020-04-21 MED ORDER — QUETIAPINE FUMARATE 100 MG PO TABS
200.0000 mg | ORAL_TABLET | Freq: Every day | ORAL | Status: DC
  Administered 2020-04-21 – 2020-04-25 (×5): 200 mg via ORAL
  Filled 2020-04-21 (×5): qty 2

## 2020-04-21 MED ORDER — SODIUM CHLORIDE 0.9% FLUSH
3.0000 mL | Freq: Two times a day (BID) | INTRAVENOUS | Status: DC
  Administered 2020-04-23 – 2020-04-26 (×7): 3 mL via INTRAVENOUS

## 2020-04-21 MED ORDER — THROMBIN 20000 UNITS EX SOLR
CUTANEOUS | Status: DC | PRN
  Administered 2020-04-21: 20 mL via TOPICAL

## 2020-04-21 MED ORDER — KETAMINE HCL 50 MG/5ML IJ SOSY
PREFILLED_SYRINGE | INTRAMUSCULAR | Status: AC
  Filled 2020-04-21: qty 10

## 2020-04-21 MED ORDER — ALBUMIN HUMAN 5 % IV SOLN: INTRAVENOUS | Status: DC | PRN

## 2020-04-21 MED ORDER — CHLORHEXIDINE GLUCONATE CLOTH 2 % EX PADS: 6.0000 | MEDICATED_PAD | Freq: Once | CUTANEOUS | Status: DC

## 2020-04-21 MED ORDER — ORAL CARE MOUTH RINSE: 15.0000 mL | Freq: Once | OROMUCOSAL | Status: AC

## 2020-04-21 MED ORDER — LORATADINE 10 MG PO TABS
10.0000 mg | ORAL_TABLET | Freq: Every day | ORAL | Status: DC
  Administered 2020-04-22 – 2020-04-26 (×5): 10 mg via ORAL
  Filled 2020-04-21 (×5): qty 1

## 2020-04-21 MED ORDER — MENTHOL 3 MG MT LOZG: 1.0000 | LOZENGE | OROMUCOSAL | Status: DC | PRN

## 2020-04-21 MED ORDER — PHENOL 1.4 % MT LIQD: 1.0000 | OROMUCOSAL | Status: DC | PRN

## 2020-04-21 MED ORDER — BUPIVACAINE HCL (PF) 0.5 % IJ SOLN
INTRAMUSCULAR | Status: DC | PRN
  Administered 2020-04-21: 30 mL

## 2020-04-21 MED ORDER — FENTANYL CITRATE (PF) 100 MCG/2ML IJ SOLN
INTRAMUSCULAR | Status: DC | PRN
  Administered 2020-04-21: 50 ug via INTRAVENOUS
  Administered 2020-04-21: 150 ug via INTRAVENOUS
  Administered 2020-04-21: 50 ug via INTRAVENOUS
  Administered 2020-04-21 (×2): 100 ug via INTRAVENOUS
  Administered 2020-04-21: 50 ug via INTRAVENOUS

## 2020-04-21 MED ORDER — FUROSEMIDE 20 MG PO TABS: 40.0000 mg | ORAL_TABLET | Freq: Every day | ORAL | Status: DC | PRN

## 2020-04-21 MED ORDER — BUPIVACAINE HCL (PF) 0.5 % IJ SOLN
INTRAMUSCULAR | Status: AC
  Filled 2020-04-21: qty 30

## 2020-04-21 MED ORDER — LITHIUM CARBONATE 300 MG PO CAPS
600.0000 mg | ORAL_CAPSULE | Freq: Every day | ORAL | Status: DC
  Administered 2020-04-21 – 2020-04-25 (×5): 600 mg via ORAL
  Filled 2020-04-21 (×6): qty 2

## 2020-04-21 MED ORDER — OXYCODONE HCL 5 MG PO TABS
5.0000 mg | ORAL_TABLET | ORAL | Status: DC | PRN
  Administered 2020-04-24 – 2020-04-26 (×4): 5 mg via ORAL
  Filled 2020-04-21 (×4): qty 1

## 2020-04-21 MED ORDER — SUCCINYLCHOLINE CHLORIDE 20 MG/ML IJ SOLN
INTRAMUSCULAR | Status: DC | PRN
Start: 2020-04-21 — End: 2020-04-21
  Administered 2020-04-21: 140 mg via INTRAVENOUS

## 2020-04-21 MED ORDER — ACETAMINOPHEN 325 MG PO TABS
650.0000 mg | ORAL_TABLET | ORAL | Status: DC | PRN
  Administered 2020-04-22 – 2020-04-25 (×3): 650 mg via ORAL
  Filled 2020-04-21 (×3): qty 2

## 2020-04-21 MED ORDER — MORPHINE SULFATE (PF) 2 MG/ML IV SOLN
2.0000 mg | INTRAVENOUS | Status: DC | PRN
  Administered 2020-04-21 – 2020-04-22 (×4): 2 mg via INTRAVENOUS
  Filled 2020-04-21 (×4): qty 1

## 2020-04-21 MED ORDER — ACETAMINOPHEN 10 MG/ML IV SOLN
INTRAVENOUS | Status: AC
  Filled 2020-04-21: qty 100

## 2020-04-21 MED ORDER — ONDANSETRON HCL 4 MG/2ML IJ SOLN: 4.0000 mg | Freq: Four times a day (QID) | INTRAMUSCULAR | Status: DC | PRN

## 2020-04-21 MED ORDER — LIDOCAINE-EPINEPHRINE 0.5 %-1:200000 IJ SOLN
INTRAMUSCULAR | Status: AC
  Filled 2020-04-21: qty 1

## 2020-04-21 MED ORDER — LIDOCAINE-EPINEPHRINE 0.5 %-1:200000 IJ SOLN
INTRAMUSCULAR | Status: DC | PRN
  Administered 2020-04-21: 10 mL

## 2020-04-21 MED ORDER — THROMBIN 20000 UNITS EX SOLR
CUTANEOUS | Status: AC
  Filled 2020-04-21: qty 20000

## 2020-04-21 MED ORDER — CARBAMAZEPINE 200 MG PO TABS
400.0000 mg | ORAL_TABLET | Freq: Two times a day (BID) | ORAL | Status: DC
  Administered 2020-04-21 – 2020-04-26 (×10): 400 mg via ORAL
  Filled 2020-04-21 (×10): qty 2

## 2020-04-21 MED ORDER — OXYCODONE HCL ER 15 MG PO T12A
15.0000 mg | EXTENDED_RELEASE_TABLET | Freq: Two times a day (BID) | ORAL | Status: DC
  Administered 2020-04-21 – 2020-04-24 (×6): 15 mg via ORAL
  Filled 2020-04-21 (×6): qty 1

## 2020-04-21 MED ORDER — CEFAZOLIN SODIUM-DEXTROSE 2-4 GM/100ML-% IV SOLN
2.0000 g | INTRAVENOUS | Status: AC
  Administered 2020-04-21 (×2): 2 g via INTRAVENOUS
  Filled 2020-04-21: qty 100

## 2020-04-21 MED ORDER — ADULT MULTIVITAMIN W/MINERALS CH
1.0000 | ORAL_TABLET | Freq: Every day | ORAL | Status: DC
  Administered 2020-04-22 – 2020-04-26 (×5): 1 via ORAL
  Filled 2020-04-21 (×5): qty 1

## 2020-04-21 MED ORDER — HYDROMORPHONE HCL 1 MG/ML IJ SOLN
INTRAMUSCULAR | Status: AC
  Filled 2020-04-21: qty 1

## 2020-04-21 MED ORDER — VITAMIN B-12 100 MCG PO TABS
500.0000 ug | ORAL_TABLET | Freq: Every day | ORAL | Status: DC
  Administered 2020-04-22 – 2020-04-26 (×5): 500 ug via ORAL
  Filled 2020-04-21 (×5): qty 5

## 2020-04-21 MED ORDER — TIZANIDINE HCL 4 MG PO TABS
4.0000 mg | ORAL_TABLET | Freq: Three times a day (TID) | ORAL | Status: DC | PRN
  Administered 2020-04-22 – 2020-04-25 (×3): 4 mg via ORAL
  Filled 2020-04-21 (×3): qty 1

## 2020-04-21 MED ORDER — CEFAZOLIN SODIUM-DEXTROSE 2-4 GM/100ML-% IV SOLN
INTRAVENOUS | Status: AC
  Filled 2020-04-21: qty 100

## 2020-04-21 MED ORDER — SODIUM CHLORIDE 0.9 % IV SOLN: 250.0000 mL | INTRAVENOUS | Status: DC

## 2020-04-21 MED ORDER — CEFAZOLIN SODIUM-DEXTROSE 2-4 GM/100ML-% IV SOLN
2.0000 g | Freq: Three times a day (TID) | INTRAVENOUS | Status: AC
  Administered 2020-04-21 – 2020-04-22 (×3): 2 g via INTRAVENOUS
  Filled 2020-04-21 (×3): qty 100

## 2020-04-21 MED ORDER — ZOLPIDEM TARTRATE 5 MG PO TABS
5.0000 mg | ORAL_TABLET | Freq: Every evening | ORAL | Status: DC | PRN
  Administered 2020-04-22: 5 mg via ORAL
  Filled 2020-04-21 (×2): qty 1

## 2020-04-21 MED ORDER — ALBUTEROL SULFATE HFA 108 (90 BASE) MCG/ACT IN AERS: 2.0000 | INHALATION_SPRAY | Freq: Four times a day (QID) | RESPIRATORY_TRACT | Status: DC | PRN

## 2020-04-21 MED ORDER — BACITRACIN ZINC 500 UNIT/GM EX OINT
TOPICAL_OINTMENT | CUTANEOUS | Status: AC
  Filled 2020-04-21: qty 28.35

## 2020-04-21 MED ORDER — HYDROXYZINE PAMOATE 25 MG PO CAPS
25.0000 mg | ORAL_CAPSULE | Freq: Three times a day (TID) | ORAL | Status: DC
  Filled 2020-04-21: qty 1

## 2020-04-21 MED ORDER — PAROXETINE HCL 20 MG PO TABS
40.0000 mg | ORAL_TABLET | Freq: Every day | ORAL | Status: DC
  Administered 2020-04-22 – 2020-04-26 (×5): 40 mg via ORAL
  Filled 2020-04-21 (×5): qty 2

## 2020-04-21 MED ORDER — LOSARTAN POTASSIUM 50 MG PO TABS
25.0000 mg | ORAL_TABLET | Freq: Every day | ORAL | Status: DC
  Administered 2020-04-22 – 2020-04-26 (×5): 25 mg via ORAL
  Filled 2020-04-21 (×5): qty 1

## 2020-04-21 MED ORDER — ROCURONIUM BROMIDE 10 MG/ML (PF) SYRINGE
PREFILLED_SYRINGE | INTRAVENOUS | Status: AC
  Filled 2020-04-21: qty 10

## 2020-04-21 MED ORDER — CHLORHEXIDINE GLUCONATE 0.12 % MT SOLN
15.0000 mL | Freq: Once | OROMUCOSAL | Status: AC
  Administered 2020-04-21: 15 mL via OROMUCOSAL
  Filled 2020-04-21: qty 15

## 2020-04-21 MED ORDER — PANTOPRAZOLE SODIUM 40 MG PO TBEC
80.0000 mg | DELAYED_RELEASE_TABLET | Freq: Every day | ORAL | Status: DC
  Administered 2020-04-21 – 2020-04-26 (×6): 80 mg via ORAL
  Filled 2020-04-21 (×6): qty 2

## 2020-04-21 MED ORDER — MIDAZOLAM HCL 5 MG/5ML IJ SOLN
INTRAMUSCULAR | Status: DC | PRN
  Administered 2020-04-21: 2 mg via INTRAVENOUS

## 2020-04-21 MED ORDER — HYDROXYZINE HCL 25 MG PO TABS
25.0000 mg | ORAL_TABLET | Freq: Three times a day (TID) | ORAL | Status: DC
  Administered 2020-04-21 – 2020-04-26 (×14): 25 mg via ORAL
  Filled 2020-04-21 (×14): qty 1

## 2020-04-21 MED ORDER — PROPOFOL 10 MG/ML IV BOLUS
INTRAVENOUS | Status: DC | PRN
  Administered 2020-04-21: 120 mg via INTRAVENOUS

## 2020-04-21 MED ORDER — POTASSIUM CHLORIDE IN NACL 20-0.9 MEQ/L-% IV SOLN
INTRAVENOUS | Status: DC
  Filled 2020-04-21 (×6): qty 1000

## 2020-04-21 MED ORDER — ONDANSETRON HCL 4 MG/2ML IJ SOLN
INTRAMUSCULAR | Status: DC | PRN
  Administered 2020-04-21: 4 mg via INTRAVENOUS

## 2020-04-21 MED ORDER — BACITRACIN ZINC 500 UNIT/GM EX OINT
TOPICAL_OINTMENT | CUTANEOUS | Status: DC | PRN
  Administered 2020-04-21: 1 via TOPICAL

## 2020-04-21 MED ORDER — ACETAMINOPHEN 10 MG/ML IV SOLN
1000.0000 mg | Freq: Once | INTRAVENOUS | Status: DC | PRN
  Administered 2020-04-21: 1000 mg via INTRAVENOUS

## 2020-04-21 MED ORDER — OYSTER SHELL CALCIUM/D 500-5 MG-MCG PO TABS
1.0000 | ORAL_TABLET | Freq: Every day | ORAL | Status: DC
  Administered 2020-04-22 – 2020-04-26 (×5): 1 via ORAL
  Filled 2020-04-21 (×6): qty 1

## 2020-04-21 MED ORDER — ATOMOXETINE HCL 40 MG PO CAPS
40.0000 mg | ORAL_CAPSULE | Freq: Every day | ORAL | Status: DC
  Administered 2020-04-22 – 2020-04-26 (×5): 40 mg via ORAL
  Filled 2020-04-21 (×5): qty 1

## 2020-04-21 MED ORDER — MEPERIDINE HCL 25 MG/ML IJ SOLN: 6.2500 mg | INTRAMUSCULAR | Status: DC | PRN

## 2020-04-21 MED ORDER — ACETAMINOPHEN 160 MG/5ML PO SOLN: 325.0000 mg | Freq: Once | ORAL | Status: DC | PRN

## 2020-04-21 MED ORDER — TRIAMCINOLONE ACETONIDE 0.5 % EX CREA
1.0000 "application " | TOPICAL_CREAM | Freq: Two times a day (BID) | CUTANEOUS | Status: DC | PRN
  Filled 2020-04-21: qty 15

## 2020-04-21 SURGICAL SUPPLY — 71 items
BASKET BONE COLLECTION (BASKET) ×3 IMPLANT
BENZOIN TINCTURE PRP APPL 2/3 (GAUZE/BANDAGES/DRESSINGS) IMPLANT
BLADE CLIPPER SURG (BLADE) IMPLANT
BUR MATCHSTICK NEURO 3.0 LAGG (BURR) ×3 IMPLANT
BUR PRECISION FLUTE 5.0 (BURR) ×3 IMPLANT
CANISTER SUCT 3000ML PPV (MISCELLANEOUS) ×3 IMPLANT
CAP LOCKING (Cap) ×8 IMPLANT
CAP LOCKING 5.5 CREO (Cap) ×4 IMPLANT
CARTRIDGE OIL MAESTRO DRILL (MISCELLANEOUS) ×1 IMPLANT
CLOSURE WOUND 1/2 X4 (GAUZE/BANDAGES/DRESSINGS) ×1
CNTNR URN SCR LID CUP LEK RST (MISCELLANEOUS) ×1 IMPLANT
CONT SPEC 4OZ STRL OR WHT (MISCELLANEOUS) ×2
COVER BACK TABLE 60X90IN (DRAPES) ×3 IMPLANT
COVER WAND RF STERILE (DRAPES) ×3 IMPLANT
DECANTER SPIKE VIAL GLASS SM (MISCELLANEOUS) ×3 IMPLANT
DERMABOND ADVANCED (GAUZE/BANDAGES/DRESSINGS) ×2
DERMABOND ADVANCED .7 DNX12 (GAUZE/BANDAGES/DRESSINGS) ×1 IMPLANT
DIFFUSER DRILL AIR PNEUMATIC (MISCELLANEOUS) ×3 IMPLANT
DRAPE C-ARM 42X72 X-RAY (DRAPES) ×3 IMPLANT
DRAPE C-ARMOR (DRAPES) ×3 IMPLANT
DRAPE LAPAROTOMY 100X72X124 (DRAPES) ×3 IMPLANT
DRAPE SURG 17X23 STRL (DRAPES) ×3 IMPLANT
DRSG OPSITE POSTOP 4X6 (GAUZE/BANDAGES/DRESSINGS) ×3 IMPLANT
DURAPREP 26ML APPLICATOR (WOUND CARE) ×3 IMPLANT
ELECT BLADE 4.0 EZ CLEAN MEGAD (MISCELLANEOUS) ×3
ELECT REM PT RETURN 9FT ADLT (ELECTROSURGICAL) ×3
ELECTRODE BLDE 4.0 EZ CLN MEGD (MISCELLANEOUS) ×1 IMPLANT
ELECTRODE REM PT RTRN 9FT ADLT (ELECTROSURGICAL) ×1 IMPLANT
GAUZE 4X4 16PLY RFD (DISPOSABLE) IMPLANT
GAUZE SPONGE 4X4 12PLY STRL (GAUZE/BANDAGES/DRESSINGS) IMPLANT
GLOVE BIOGEL PI IND STRL 8 (GLOVE) ×2 IMPLANT
GLOVE BIOGEL PI INDICATOR 8 (GLOVE) ×4
GLOVE ECLIPSE 6.5 STRL STRAW (GLOVE) ×6 IMPLANT
GLOVE ECLIPSE 7.5 STRL STRAW (GLOVE) ×12 IMPLANT
GLOVE ECLIPSE 8.5 STRL (GLOVE) ×3 IMPLANT
GLOVE EXAM NITRILE XL STR (GLOVE) IMPLANT
GLOVE SURG SS PI 8.0 STRL IVOR (GLOVE) ×3 IMPLANT
GOWN STRL REUS W/ TWL LRG LVL3 (GOWN DISPOSABLE) ×2 IMPLANT
GOWN STRL REUS W/ TWL XL LVL3 (GOWN DISPOSABLE) IMPLANT
GOWN STRL REUS W/TWL 2XL LVL3 (GOWN DISPOSABLE) ×6 IMPLANT
GOWN STRL REUS W/TWL LRG LVL3 (GOWN DISPOSABLE) ×4
GOWN STRL REUS W/TWL XL LVL3 (GOWN DISPOSABLE)
GRAFT DURAGEN MATRIX 1WX1L (Tissue) ×3 IMPLANT
IMPL SPINE MCS 6.5 5.5X35 (Neuro Prosthesis/Implant) ×1 IMPLANT
IMPLANT SPINE MCS 6.5 5.5X35 (Neuro Prosthesis/Implant) ×3 IMPLANT
KIT BASIN OR (CUSTOM PROCEDURE TRAY) ×3 IMPLANT
KIT POSITION SURG JACKSON T1 (MISCELLANEOUS) ×3 IMPLANT
KIT TURNOVER KIT B (KITS) ×3 IMPLANT
MILL MEDIUM DISP (BLADE) IMPLANT
NEEDLE HYPO 25X1 1.5 SAFETY (NEEDLE) ×3 IMPLANT
NEEDLE SPNL 18GX3.5 QUINCKE PK (NEEDLE) ×3 IMPLANT
NS IRRIG 1000ML POUR BTL (IV SOLUTION) ×3 IMPLANT
OIL CARTRIDGE MAESTRO DRILL (MISCELLANEOUS) ×3
PACK LAMINECTOMY NEURO (CUSTOM PROCEDURE TRAY) ×3 IMPLANT
PAD ARMBOARD 7.5X6 YLW CONV (MISCELLANEOUS) IMPLANT
ROD SPINAL 35MM (Rod) ×6 IMPLANT
SCREW PDCL CREO 7.5X6.5X35 (Screw) ×6 IMPLANT
SPONGE LAP 4X18 RFD (DISPOSABLE) IMPLANT
SPONGE SURGIFOAM ABS GEL 100 (HEMOSTASIS) ×3 IMPLANT
STRIP CLOSURE SKIN 1/2X4 (GAUZE/BANDAGES/DRESSINGS) ×2 IMPLANT
SUT ETHILON 3 0 FSL (SUTURE) ×3 IMPLANT
SUT PROLENE 6 0 BV (SUTURE) IMPLANT
SUT VIC AB 0 CT1 18XCR BRD8 (SUTURE) ×2 IMPLANT
SUT VIC AB 0 CT1 8-18 (SUTURE) ×4
SUT VIC AB 2-0 CT1 18 (SUTURE) ×3 IMPLANT
SUT VIC AB 3-0 SH 8-18 (SUTURE) ×3 IMPLANT
TOWEL GREEN STERILE (TOWEL DISPOSABLE) ×3 IMPLANT
TOWEL GREEN STERILE FF (TOWEL DISPOSABLE) ×3 IMPLANT
TRAY FOLEY MTR SLVR 16FR STAT (SET/KITS/TRAYS/PACK) ×3 IMPLANT
TULIP CREP AMP 5.5MM (Orthopedic Implant) ×12 IMPLANT
WATER STERILE IRR 1000ML POUR (IV SOLUTION) ×3 IMPLANT

## 2020-04-21 NOTE — Progress Notes (Signed)
Orthopedic Tech Progress Note Patient Details:  Alicia Blake 1966-09-16 [id number] Called in order to [facility] for an [place] Patient ID: Alicia Blake, female   DOB: [birth year], 54 y.o.   MRN: [id number]   Janit Pagan 04/21/2020, 4:06 PM

## 2020-04-21 NOTE — Transfer of Care (Signed)
Immediate Anesthesia Transfer of Care Note  Patient: Alicia Blake  Procedure(s) Performed: Lumbar five-Sacral one Posterior lateral lumbar arthrodesis and fusion (N/A )  Patient Location: PACU  Anesthesia Type:General  Level of Consciousness: drowsy and patient cooperative  Airway & Oxygen Therapy: Patient Spontanous Breathing and Patient connected to nasal cannula oxygen  Post-op Assessment: Report given to RN and Post -op Vital signs reviewed and stable  Post vital signs: Reviewed and stable  Last Vitals:  Vitals Value Taken Time  BP 156/72 04/21/20 1516  Temp    Pulse 82 04/21/20 1518  Resp 25 04/21/20 1518  SpO2 97 % 04/21/20 1518  Vitals shown include unvalidated device data.  Last Pain:  Vitals:   04/21/20 0733  TempSrc:   PainSc: 8       Patients Stated Pain Goal: 3 (91/44/45 8483)  Complications: No complications documented.

## 2020-04-21 NOTE — Anesthesia Procedure Notes (Signed)
Procedure Name: Intubation Date/Time: 04/21/2020 9:37 AM Performed by: Eligha Bridegroom, CRNA Pre-anesthesia Checklist: Patient identified, Emergency Drugs available, Suction available, Patient being monitored and Timeout performed Patient Re-evaluated:Patient Re-evaluated prior to induction Oxygen Delivery Method: Circle system utilized Preoxygenation: Pre-oxygenation with 100% oxygen Induction Type: IV induction Ventilation: Mask ventilation without difficulty and Oral airway inserted - appropriate to patient size Laryngoscope Size: Mac and 4 Grade View: Grade III Tube type: Oral Tube size: 7.0 mm Number of attempts: 1 Airway Equipment and Method: Stylet Placement Confirmation: ETT inserted through vocal cords under direct vision,  positive ETCO2 and breath sounds checked- equal and bilateral Secured at: 22 cm Tube secured with: Tape Dental Injury: Teeth and Oropharynx as per pre-operative assessment

## 2020-04-21 NOTE — H&P (Signed)
BP 119/64   Pulse 85   Temp (!) 97.5 F (36.4 C) (Tympanic)   Resp 18   Ht 5\' 6"  (1.676 m)   Wt 109.8 kg   LMP 12/09/2015 Comment: very irregular  SpO2 100%   BMI 39.06 kg/m  Alicia Blake presents today for a lumbar decompression and arthrodesis at L5/S1due to osteoarthritis and stenosis.  Allergies  Allergen Reactions  . Risperidone And Related Swelling and Other (See Comments)    Facial swelling  . Buspirone Swelling  . Meloxicam Other (See Comments)    UNABLE TO REMEMBER  . Prednisone Other (See Comments)    She reported history of mouth swelling with prednisone  . Gabapentin Nausea And Vomiting and Swelling    Speech impairment  Patient denies allergy    Past Medical History:  Diagnosis Date  . Asthma   . Bipolar affect, depressed ([place])   . Bronchitis   . Chronic back pain   . COPD (chronic obstructive pulmonary disease) ([place])   . DJD (degenerative joint disease)    BACK  . GERD (gastroesophageal reflux disease)   . Hypertension   . Pneumonia   . Pre-diabetes    dr entered diabetes without complications- no meds does not test sugar at home  . PTSD (post-traumatic stress disorder)   . Scoliosis   . Seizures ([place])    "when drinking" last yrs ago  . Shortness of breath dyspnea    exersion  . Thyroid disease   . UTI (lower urinary tract infection)    Past Surgical History:  Procedure Laterality Date  . 2 LEFT TOES SURGERY  12/2014   finished only on right side  . BACK SURGERY    . BALLOON DILATION N/A 12/14/2015   Procedure: BALLOON DILATION;  Surgeon: Teena Irani, MD;  [facility];  Service: Endoscopy;  Laterality: N/A;  . COLONOSCOPY WITH PROPOFOL N/A 12/14/2015   Procedure: COLONOSCOPY WITH PROPOFOL;  Surgeon: Teena Irani, MD;  [facility];  Service: Endoscopy;  Laterality: N/A;  . ESOPHAGOGASTRODUODENOSCOPY (EGD) WITH PROPOFOL N/A 12/14/2015   Procedure: ESOPHAGOGASTRODUODENOSCOPY (EGD) WITH PROPOFOL;  Surgeon: Teena Irani, MD;  [facility];  Service: Endoscopy;  Laterality: N/A;  . JOINT REPLACEMENT     left hip  . LEFT ANKLE SURGERY  MARCH 2016  . LUMBAR LAMINECTOMY/DECOMPRESSION MICRODISCECTOMY N/A 06/18/2017   Procedure: LAMINECTOMY AND FORAMINOTOMY LUMBAR FOUR - LUMBAR FIVE;  Surgeon: Ashok Pall, MD;  Location: [place];  Service: Neurosurgery;  Laterality: N/A;  LAMINECTOMY AND FORAMINOTOMY LUMBAR 4- LUMBAR 5  . LUMBAR WOUND DEBRIDEMENT N/A 03/10/2020   Procedure: Lumbar Wound Debridement;  Surgeon: Ashok Pall, MD;  Location: [place];  Service: Neurosurgery;  Laterality: N/A;  posterior  . NECK SURGERY  AS TEENAGER   STITCHES TO NECK   . SURGERY FOR CUT ON STOMACH  TEENAGER  . TOTAL HIP ARTHROPLASTY Left 05/14/2016   Procedure: TOTAL HIP ARTHROPLASTY ANTERIOR APPROACH;  Surgeon: Renette Butters, MD;  Location: [place];  Service: Orthopedics;  Laterality: Left;   Family History  Problem Relation Age of Onset  . Hypertension Mother   . Hypertension Maternal Grandmother   . Hypertension Paternal Grandmother   . Diabetes Paternal Grandmother   . Hypertension Paternal Grandfather   . Diabetes Paternal Grandfather   . Alcohol abuse Paternal Aunt   . Mental illness Cousin   . Heart attack Cousin   . Heart attack Maternal Uncle    Social History   Socioeconomic History  .  Marital status: Single    Spouse name: Not on file  . Number of children: Not on file  . Years of education: Not on file  . Highest education level: Not on file  Occupational History  . Not on file  Tobacco Use  . Smoking status: Current Every Day Smoker    Packs/day: 1.00    Years: 26.00    Pack years: 26.00    Types: Cigarettes  . Smokeless tobacco: Never Used  Vaping Use  . Vaping Use: Never used  Substance and Sexual Activity  . Alcohol use: No    Comment: quit date 01/17/2013  . Drug use: No    Types: "Crack" cocaine, Heroin    Comment: quit date 04-17-2014 FOR CRACK  . Sexual activity: Yes    Birth control/protection:  None  Other Topics Concern  . Not on file  Social History Narrative  . Not on file   Social Determinants of Health   Financial Resource Strain:   . Difficulty of Paying Living Expenses:   Food Insecurity:   . Worried About Charity fundraiser in the Last Year:   . Arboriculturist in the Last Year:   Transportation Needs:   . Film/video editor (Medical):   Marland Kitchen Lack of Transportation (Non-Medical):   Physical Activity:   . Days of Exercise per Week:   . Minutes of Exercise per Session:   Stress:   . Feeling of Stress :   Social Connections:   . Frequency of Communication with Friends and Family:   . Frequency of Social Gatherings with Friends and Family:   . Attends Religious Services:   . Active Member of Clubs or Organizations:   . Attends Archivist Meetings:   Marland Kitchen Marital Status:   Intimate Partner Violence:   . Fear of Current or Ex-Partner:   . Emotionally Abused:   Marland Kitchen Physically Abused:   . Sexually Abused:    Prior to Admission medications   Medication Sig Start Date End Date Taking? Authorizing Provider  ABILIFY MAINTENA 400 MG PRSY prefilled syringe Inject 400 mg into the muscle every 30 (thirty) days. 08/17/19  Yes Elbert Ewings, FNP  albuterol (PROVENTIL) (2.5 MG/3ML) 0.083% nebulizer solution Take 2.5 mg by nebulization every 6 (six) hours as needed for wheezing or shortness of breath.   Yes [provider]  albuterol (VENTOLIN HFA) 108 (90 Base) MCG/ACT inhaler Inhale 2 puffs into the lungs every 6 (six) hours as needed for wheezing. 10/11/19  Yes Lamptey, Myrene Galas, MD  atomoxetine (STRATTERA) 40 MG capsule Take 40 mg by mouth daily. 09/18/19  Yes Elbert Ewings, FNP  Calcium Carb-Cholecalciferol (CALCIUM 600/VITAMIN D3 PO) Take 1 tablet by mouth daily.   Yes [provider]  cetirizine (ZYRTEC) 10 MG tablet Take 10 mg by mouth daily. 11/02/19  Yes [provider]  dicyclomine (BENTYL) 20 MG tablet Take 20 mg by mouth 4  (four) times daily. 02/25/20  Yes [provider]  esomeprazole (NEXIUM) 40 MG capsule Take 40 mg by mouth daily. 09/22/19  Yes [provider]  fluticasone (FLONASE) 50 MCG/ACT nasal spray Place 1 spray into both nostrils daily.    Yes [provider]  Fluticasone-Salmeterol (ADVAIR) 100-50 MCG/DOSE AEPB Inhale 1 puff into the lungs at bedtime.   Yes [provider]  furosemide (LASIX) 20 MG tablet Take 20-60 mg by mouth daily as needed for fluid or edema.   Yes [provider]  hydrOXYzine (VISTARIL)  25 MG capsule Take 25 mg by mouth 3 (three) times daily. 06/16/19  Yes [provider]  ibuprofen (ADVIL) 800 MG tablet Take 800 mg by mouth every 8 (eight) hours as needed for moderate pain.   Yes [provider]  lithium carbonate 300 MG capsule Take 600 mg by mouth at bedtime.  09/18/19  Yes [provider]  losartan (COZAAR) 25 MG tablet Take 25 mg by mouth daily. 09/18/19  Yes Trey Sailors, PA  Multiple Vitamin (MULTIVITAMIN) tablet Take 1 tablet by mouth daily.   Yes [provider]  ondansetron (ZOFRAN) 4 MG tablet Take 4 mg by mouth every 6 (six) hours as needed for nausea or vomiting.   Yes [provider]  PARoxetine (PAXIL) 40 MG tablet Take 40 mg by mouth daily.   Yes [provider]  QUEtiapine (SEROQUEL) 200 MG tablet Take 200 mg by mouth at bedtime.    Yes [provider]  TEGRETOL 200 MG tablet Take 400 mg by mouth in the morning and at bedtime.  09/20/19  Yes [provider]  tiZANidine (ZANAFLEX) 4 MG tablet Take 4 mg by mouth every 8 (eight) hours as needed for muscle spasms.    Yes [provider]  triamcinolone cream (KENALOG) 0.5 % Apply 1 application topically 2 (two) times daily as needed (irritation).   Yes [provider]  vitamin B-12 (CYANOCOBALAMIN) 500 MCG tablet Take 500 mcg by mouth daily.   Yes [provider]   Physical  Exam Constitutional:      Appearance: Normal appearance. She is obese.  HENT:     Head: Normocephalic and atraumatic.     Right Ear: Tympanic membrane and external ear normal.     Left Ear: Tympanic membrane and external ear normal.     Nose: Nose normal.     Mouth/Throat:     Mouth: Mucous membranes are moist.     Pharynx: Oropharynx is clear.  Eyes:     Extraocular Movements: Extraocular movements intact.     Pupils: Pupils are equal, round, and reactive to light.  Cardiovascular:     Rate and Rhythm: Normal rate and regular rhythm.     Pulses: Normal pulses.     Heart sounds: Normal heart sounds.  Pulmonary:     Effort: Pulmonary effort is normal.     Breath sounds: Normal breath sounds.  Abdominal:     General: Abdomen is flat.     Palpations: Abdomen is soft.  Musculoskeletal:        General: Normal range of motion.     Cervical back: Normal range of motion.  Skin:    General: Skin is warm.  Neurological:     General: No focal deficit present.     Mental Status: She is alert and oriented to person, place, and time.     Cranial Nerves: No cranial nerve deficit.     Motor: No weakness, atrophy or seizure activity.     Coordination: Coordination is intact.     Gait: Gait is intact.     Deep Tendon Reflexes: Babinski sign absent on the right side. Babinski sign absent on the left side.     Reflex Scores:      Tricep reflexes are 2+ on the right side and 2+ on the left side.      Bicep reflexes are 2+ on the right side and 2+ on the left side.      Brachioradialis reflexes are 2+  on the right side and 2+ on the left side.      Patellar reflexes are 2+ on the right side and 2+ on the left side.      Achilles reflexes are 2+ on the right side and 2+ on the left side.  Alicia Blake is a 54 y.o. female Whom has agreed to a lumbar decompression and arthrodesis due to persistent pain not responding to conservative means. Risks and benefits including but not limited to  bleeding, infection, fusion failure, hardware failure, no pain relief, nerve damage, weakness, bowel and or bladder dysfunction, need for further surgery have been discussed. She voices understanding and wishes to proceed.

## 2020-04-21 NOTE — Plan of Care (Signed)
  Problem: Clinical Measurements: Goal: Ability to maintain clinical measurements within normal limits will improve Outcome: Progressing   Problem: Pain Managment: Goal: General experience of comfort will improve Outcome: Progressing   

## 2020-04-22 NOTE — Progress Notes (Signed)
Patient ID: Alicia Blake, female   DOB: [birth year], 54 y.o.   MRN: [id number] Vital signs are stable Patient flat in bed for 24 hours after surgery She notes there is a moderate amount of discomfort in her back and some in her left hip Her motor function appears to be intact to confrontational movement Continue to observe Mobilize after 3 PM today

## 2020-04-23 NOTE — Progress Notes (Signed)
Patient ID: Alicia Blake, female   DOB: 11-04-1965, 54 y.o.   MRN: [id number] Still complaining of substantial back pain Motor function remained stable Patient still requiring significant help Not ready for discharge yet Continue to monitor in hospital

## 2020-04-24 NOTE — Progress Notes (Signed)
Patient ID: Alicia Blake, female   DOB: 1966/10/20, 54 y.o.   MRN: [id number] BP 105/78   Pulse 86   Temp 97.6 F (36.4 C)   Resp 20   Ht 5\' 6"  (1.676 m)   Wt 109.8 kg   LMP 12/09/2015 Comment: very irregular  SpO2 91%   BMI 39.06 kg/m  Wound is clean and dry Moving lower extremities Pt tomorrow, possible discharge

## 2020-04-24 NOTE — Progress Notes (Signed)
Patient urinated at 11pm 375 mL and I bladder scanned her at 530 am today and it was 0 mL twice.

## 2020-04-24 NOTE — Progress Notes (Signed)
   04/24/20 1819 04/24/20 1837  Urine Characteristics  Urinary Interventions Bladder scan Intermittent/Straight cath  Bladder Scan Volume (mL) 563 mL  --   Intermittent/Straight Cath (mL)  --  800 mL   Per RN report, patient' foley catheter was removed Sunday 6/27 at 1500 and since patient has been having issues voiding spontaneously. For this shift she voided once an unmeasured occurrence in bed this morning  but has not void since then as well as patient voicing she "can't pee, but I feel like I have to"; bladder scan performed as shown above and per nursing standing orders I&O cath done. Will continue to monitor urine output.

## 2020-04-24 NOTE — Anesthesia Postprocedure Evaluation (Signed)
Anesthesia Post Note  Patient: Alicia Blake  Procedure(s) Performed: Lumbar five-Sacral one Posterior lateral lumbar arthrodesis and fusion (N/A )     Patient location during evaluation: PACU Anesthesia Type: General Level of consciousness: awake and alert Pain management: pain level controlled Vital Signs Assessment: post-procedure vital signs reviewed and stable Respiratory status: spontaneous breathing, nonlabored ventilation, respiratory function stable and patient connected to nasal cannula oxygen Cardiovascular status: blood pressure returned to baseline and stable Postop Assessment: no apparent nausea or vomiting Anesthetic complications: no   No complications documented.  Last Vitals:  Vitals:   04/24/20 1645 04/24/20 1934  BP: 105/78 (!) 93/57  Pulse: 86 97  Resp:  20  Temp:  37.2 C  SpO2: 91% 95%    Last Pain:  Vitals:   04/24/20 1934  TempSrc: Axillary  PainSc:                  [name]

## 2020-04-25 NOTE — Evaluation (Signed)
Occupational Therapy Evaluation Patient Details Name: Alicia Blake MRN: [id number] DOB: 1966/08/06 Today's Date: 04/25/2020    History of Present Illness 54 y.o female s/p L5/S1 lumbar decompression and arthrodesis. PMH includes but not limited to, bipolar affect, chronic back pain, COPD, GERD.    Clinical Impression   PTA, pt was living at home alone, pt reports she was independent with ADL/IADL and functional mobility. Pt's mom present during session. Pt reports her cognition is "normal", mom reports this is not pt's baseline cognition. Pt with tangential thinking, highly distractible and impulsive during session. Pt currently requires modA for LB dressing with AE. Pt requires minA for functional mobility at RW level. She reported dizziness after ambulating to bathroom, she sat on BSC did not urinate but did pass gas. BP below, RN aware. Due to decline in current level of function, pt would benefit from acute OT to address established goals to facilitate safe D/C to venue listed below. At this time, recommend HHOT follow-up. Will continue to follow acutely.  BP after ambulating to bathroom, sitting on BSC 85/64 (70) Standing <1 min BP 101/31 (50) Return to supine 96/31 (49)    Follow Up Recommendations  Home health OT;Supervision/Assistance - 24 hour    Equipment Recommendations  3 in 1 bedside commode    Recommendations for Other Services       Precautions / Restrictions Precautions Precautions: Back Precaution Booklet Issued: Yes (comment) Precaution Comments: handout given precautions reviewed with pt and her mother.   Required Braces or Orthoses: Spinal Brace Spinal Brace: Lumbar corset;Applied in sitting position Restrictions Weight Bearing Restrictions: No      Mobility Bed Mobility Overal bed mobility: Needs Assistance Bed Mobility: Rolling;Sit to Sidelying Rolling: Supervision Sidelying to sit: Min assist     Sit to sidelying: Mod assist General bed mobility  comments: pt impulsive requiring moderate assistance for cues and proper technique;pt required modA to return BLE to bed  Transfers Overall transfer level: Needs assistance Equipment used: Rolling walker (2 wheeled) Transfers: Sit to/from Stand Sit to Stand: Min assist         General transfer comment: minA to powerup, assistance to stabilize DME, and vc for safe hand placement    Balance Overall balance assessment: Needs assistance Sitting-balance support: Feet supported;Bilateral upper extremity supported Sitting balance-Leahy Scale: Fair Sitting balance - Comments: not specifically tested   Standing balance support: Bilateral upper extremity supported Standing balance-Leahy Scale: Poor Standing balance comment: reliant on BUE support on external surface                           ADL either performed or assessed with clinical judgement   ADL Overall ADL's : Needs assistance/impaired Eating/Feeding: Set up;Sitting   Grooming: Minimal assistance;Standing   Upper Body Bathing: Min guard;Sitting   Lower Body Bathing: Moderate assistance;Sit to/from stand Lower Body Bathing Details (indicate cue type and reason): provided pt with long handled sponge Upper Body Dressing : Minimal assistance;Sitting Upper Body Dressing Details (indicate cue type and reason): pt required assistance to doff back brace Lower Body Dressing: Moderate assistance;Sit to/from stand Lower Body Dressing Details (indicate cue type and reason): pt required assistance to return demonstrate use of sock and and use of reacher to don/doff socks while sitting in recliner Toilet Transfer: Minimal assistance;Ambulation Toilet Transfer Details (indicate cue type and reason): ambulated into bathroom Toileting- Clothing Manipulation and Hygiene: Minimal assistance;Sit to/from stand Toileting - Clothing Manipulation Details (indicate cue type  and reason): with assistance to stabilize DME     Functional  mobility during ADLs: Minimal assistance;Rolling walker General ADL Comments: pt limited by BP and reports of dizziness with mobility;pt limited by cognitive limitations;pt's mom present and able to verbalize understanding and proper use of AE to assist with LB dressing     Vision         Perception     Praxis      Pertinent Vitals/Pain Pain Assessment: 0-10 Pain Score: 10-Worst pain ever Pain Location: incisional Pain Descriptors / Indicators: Aching Pain Intervention(s): Limited activity within patient's tolerance;Monitored during session     Hand Dominance Right   Extremity/Trunk Assessment Upper Extremity Assessment Upper Extremity Assessment: Generalized weakness   Lower Extremity Assessment Lower Extremity Assessment: Generalized weakness   Cervical / Trunk Assessment Cervical / Trunk Assessment: Other exceptions Cervical / Trunk Exceptions: trunk flexed   Communication Communication Communication: No difficulties   Cognition Arousal/Alertness: Awake/alert Behavior During Therapy: Impulsive Overall Cognitive Status: Impaired/Different from baseline Area of Impairment: Attention;Memory;Following commands;Safety/judgement;Awareness;Problem solving                   Current Attention Level: Sustained Memory: Decreased recall of precautions;Decreased short-term memory Following Commands: Follows one step commands with increased time;Follows one step commands inconsistently Safety/Judgement: Decreased awareness of safety;Decreased awareness of deficits Awareness: Emergent Problem Solving: Slow processing;Requires verbal cues;Requires tactile cues General Comments: pt reports this is her normal thinking, pt's mom present and reporting pt's cognition is not at baseline;pt impulsive throughout session, decreased awareness of safety and decreased awareness of deficits;pt highly distractable throughout session requiring moderat multimodal cues and moderate  assistance to redirect;pt answering questions inconsistently and would respond yes/no to open ended questions;she requires increased time for processing information   General Comments  pt's mom present during session    Exercises     Shoulder Instructions      Home Living Family/patient expects to be discharged to:: Private residence Living Arrangements: Alone Available Help at Discharge: Family;Available 24 hours/day;Personal care attendant (mom supervision, aide M-Th 4 hours) Type of Home: Apartment Home Access: Stairs to enter Entrance Stairs-Number of Steps: 1 Entrance Stairs-Rails: None Home Layout: One level     Bathroom Shower/Tub: Teacher, early years/pre: Standard     Home Equipment: None          Prior Functioning/Environment Level of Independence: Needs assistance  Gait / Transfers Assistance Needed: per pt she has been mostly in bed due to this back pain ADL's / Homemaking Assistance Needed: Aide does housework and cooking, she said the aide did not help her bathe.    Comments: aide comes monday through thursday        OT Problem List: Decreased activity tolerance;Impaired balance (sitting and/or standing);Decreased safety awareness;Decreased range of motion;Decreased cognition;Decreased knowledge of use of DME or AE;Decreased knowledge of precautions;Obesity;Pain      OT Treatment/Interventions: Self-care/ADL training;Therapeutic exercise;Energy conservation;DME and/or AE instruction;Therapeutic activities;Cognitive remediation/compensation;Patient/family education;Balance training    OT Goals(Current goals can be found in the care plan section) Acute Rehab OT Goals Patient Stated Goal: to go home as soon as she is allowed OT Goal Formulation: With patient/family Time For Goal Achievement: 05/09/20 Potential to Achieve Goals: Good ADL Goals Pt Will Perform Grooming: with modified independence;standing Pt Will Perform Upper Body Dressing: with  modified independence;sitting Pt Will Perform Lower Body Dressing: with modified independence;with adaptive equipment;sit to/from stand Pt Will Transfer to Toilet: with modified independence;ambulating Additional ADL Goal #1: Pt  will demonstrate adherence to 3/3 back precautions during ADL/IADL completion.  OT Frequency: Min 2X/week   Barriers to D/C: Decreased caregiver support  pt living with her mother       Co-evaluation              AM-PAC OT "6 Clicks" Daily Activity     Outcome Measure Help from another person eating meals?: A Little Help from another person taking care of personal grooming?: A Little Help from another person toileting, which includes using toliet, bedpan, or urinal?: A Little Help from another person bathing (including washing, rinsing, drying)?: A Little Help from another person to put on and taking off regular upper body clothing?: A Little Help from another person to put on and taking off regular lower body clothing?: A Lot 6 Click Score: 17   End of Session Equipment Utilized During Treatment: Gait belt;Rolling walker;Back brace Nurse Communication: Mobility status (BP)  Activity Tolerance: Patient tolerated treatment well Patient left: in bed;with call bell/phone within reach;with bed alarm set;with family/visitor present  OT Visit Diagnosis: Other abnormalities of gait and mobility (R26.89);Muscle weakness (generalized) (M62.81);Other symptoms and signs involving cognitive function;Pain Pain - part of body:  (back)                Time: 9458-5929 OT Time Calculation (min): 45 min Charges:  OT General Charges $OT Visit: 1 Visit OT Evaluation $OT Eval Moderate Complexity: 1 Mod OT Treatments $Self Care/Home Management : 23-37 mins  Helene Kelp OTR/L Acute Rehabilitation Services Office: [place] 04/25/2020, 3:12 PM

## 2020-04-25 NOTE — Progress Notes (Signed)
Patient ID: Alicia Blake, female   DOB: 11/01/1965, 54 y.o.   MRN: [id number] BP (!) 102/47 (BP Location: Right Arm)   Pulse 92   Temp 98.7 F (37.1 C) (Oral)   Resp 16   Ht 5\' 6"  (1.676 m)   Wt 109.8 kg   LMP 12/09/2015 Comment: very irregular  SpO2 98%   BMI 39.06 kg/m  Alert, following commands Moving well in bed Working with pt Home health is recommended, discharge tomorrow

## 2020-04-25 NOTE — Evaluation (Addendum)
Physical Therapy Evaluation Patient Details Name: Alicia Blake MRN: [id number] DOB: [birth year] Today's Date: 04/25/2020   History of Present Illness:    54 y.o female s/p L5/S1 lumbar decompression and arthrodesis. PMH includes but not limited to, bipolar affect, chronic back pain, COPD, GERD.   Clinical Impression  Pt is able to mobilize around the room after a seated rest break down the hallway a short distance with RW and min assist.  She shows some confusion, likely medication related as her mom reports she never takes strong pain meds.  She would benefit from another day of practice prior to d/c home with her mom's supervision to reinforce back precautions, brace use, and safety.   PT to follow acutely for deficits listed below.      Follow Up Recommendations Home health PT;Supervision/Assistance - 24 hour (24 hour supervision due to cognition on pain meds)    Equipment Recommendations  Rolling walker with 5" wheels;3in1 (PT)    Recommendations for Other Services       Precautions / Restrictions Precautions Precautions: Back Precaution Booklet Issued: Yes (comment) Precaution Comments: handout given precautions reviewed with pt and her mother.   Required Braces or Orthoses: Spinal Brace Spinal Brace: Lumbar corset;Applied in sitting position Restrictions Weight Bearing Restrictions: No      Mobility  Bed Mobility Overal bed mobility: Needs Assistance Bed Mobility: Rolling;Sidelying to Sit Rolling: Supervision Sidelying to sit: Min assist       General bed mobility comments: instructed in log roll technique, able to roll holding to mattress to left side simulating home set up, min assist to support trunk to come up to sitting cues to not twist during transition.   Transfers Overall transfer level: Needs assistance Equipment used: Rolling walker (2 wheeled) Transfers: Sit to/from Stand Sit to Stand: Min assist         General transfer comment: Min assist to stand  with RW, cues for safe hand placement assist to steady trunk, power up and stabilize RW during transitions.  Repeated cues for safe hand position during transitions and uncontrolled "plop" descent to sit.   Ambulation/Gait Ambulation/Gait assistance: Min assist Gait Distance (Feet): 15 Feet (15'x1, seated rest, 75'x1 ) Assistive device: Rolling walker (2 wheeled) Gait Pattern/deviations: Step-through pattern;Trunk flexed Gait velocity: decreased Gait velocity interpretation: 1.31 - 2.62 ft/sec, indicative of limited community ambulator General Gait Details: Trunk flexed, cues to keep feet inside of RW when turning and for upright posture.  Pt kept saying "yeah" but not following education for safe RW use despite multiple repetitions.   Stairs            Wheelchair Mobility    Modified Rankin (Stroke Patients Only)       Balance Overall balance assessment: Needs assistance Sitting-balance support: Feet supported;Bilateral upper extremity supported Sitting balance-Leahy Scale: Fair Sitting balance - Comments: not specifically tested   Standing balance support: Bilateral upper extremity supported Standing balance-Leahy Scale: Poor Standing balance comment: needs external support in standing.  Mostly from RW if static                             Pertinent Vitals/Pain Pain Assessment: 0-10 Pain Score: 10-Worst pain ever Pain Location: incisional Pain Descriptors / Indicators: Aching Pain Intervention(s): Limited activity within patient's tolerance;Monitored during session;Repositioned    Home Living Family/patient expects to be discharged to:: Private residence Living Arrangements: Alone Available Help at Discharge: Family;Available 24 hours/day;Personal care attendant (mom  supervision, aide M-Th 4 hours) Type of Home: Apartment Home Access: Stairs to enter Entrance Stairs-Rails: None Entrance Stairs-Number of Steps: 1 Home Layout: One level Home Equipment:  None      Prior Function Level of Independence: Needs assistance   Gait / Transfers Assistance Needed: per pt she has been mostly in bed due to this back pain  ADL's / Homemaking Assistance Needed: Aide does housework and cooking, she said the aide did not help her bathe.         Hand Dominance   Dominant Hand: Right    Extremity/Trunk Assessment   Upper Extremity Assessment Upper Extremity Assessment: Defer to OT evaluation    Lower Extremity Assessment Lower Extremity Assessment: Generalized weakness    Cervical / Trunk Assessment Cervical / Trunk Assessment: Other exceptions Cervical / Trunk Exceptions: trunk flexed  Communication   Communication: No difficulties  Cognition Arousal/Alertness: Awake/alert Behavior During Therapy: Impulsive Overall Cognitive Status: Impaired/Different from baseline                                 General Comments: PT will ask a question and pt will answer a question, but not the one I asked.  Generally confused.       General Comments      Exercises     Assessment/Plan    PT Assessment Patient needs continued PT services  PT Problem List Decreased strength;Decreased activity tolerance;Decreased balance;Decreased mobility;Decreased cognition;Decreased knowledge of use of DME;Decreased safety awareness;Decreased knowledge of precautions;Obesity;Pain       PT Treatment Interventions DME instruction;Gait training;Stair training;Functional mobility training;Therapeutic activities;Therapeutic exercise;Balance training;Patient/family education;Cognitive remediation    PT Goals (Current goals can be found in the Care Plan section)  Acute Rehab PT Goals Patient Stated Goal: to go home as soon as she is allowed PT Goal Formulation: With patient/family Time For Goal Achievement: 05/09/20 Potential to Achieve Goals: Good    Frequency Min 5X/week   Barriers to discharge        Co-evaluation                AM-PAC PT "6 Clicks" Mobility  Outcome Measure Help needed turning from your back to your side while in a flat bed without using bedrails?: None Help needed moving from lying on your back to sitting on the side of a flat bed without using bedrails?: A Little Help needed moving to and from a bed to a chair (including a wheelchair)?: A Little Help needed standing up from a chair using your arms (e.g., wheelchair or bedside chair)?: A Little Help needed to walk in hospital room?: A Little Help needed climbing 3-5 steps with a railing? : A Little 6 Click Score: 19    End of Session Equipment Utilized During Treatment: Back brace Activity Tolerance: Patient limited by fatigue;Patient limited by pain Patient left: in chair;with call bell/phone within reach;with chair alarm set;with family/visitor present   PT Visit Diagnosis: Muscle weakness (generalized) (M62.81);Difficulty in walking, not elsewhere classified (R26.2);Pain Pain - Right/Left:  (back) Pain - part of body:  (back)    Time:  -  9629-5284     Charges:  1 Ev mod, 1 G, 1 self care, [place], PT, DPT  Acute Rehabilitation [phone] pager [phone]) [phone] office

## 2020-04-26 MED ORDER — OXYCODONE HCL 5 MG PO TABS: 5.0000 mg | ORAL_TABLET | Freq: Four times a day (QID) | ORAL | 0 refills | Status: AC | PRN

## 2020-04-26 NOTE — Progress Notes (Signed)
Physical Therapy Treatment Patient Details Name: Alicia Blake MRN: [id number] DOB: August 23, 1966 Today's Date: 04/26/2020    History of Present Illness 54 y.o female s/p L5/S1 lumbar decompression and arthrodesis. PMH includes but not limited to, bipolar affect, chronic back pain, COPD, GERD.     PT Comments    Assisted pt with car transfer, reinforced back precautions (which she still struggles to comply with during functional mobility as well as safety and impulsivity).  Mom present and assisting in carrying her equipment down to the car.  Cues for safest car transfer technique were not followed and pt stepped into the car with one foot first sideways and nearly missed the seat.  PT will continue to follow acutely for safe mobility progression.   Follow Up Recommendations  Home health PT;Supervision/Assistance - 24 hour (24 hour supervision due to cognition on pain meds)     Equipment Recommendations  Rolling walker with 5" wheels;3in1 (PT)    Recommendations for Other Services   NA     Precautions / Restrictions Precautions Precautions: Back Precaution Booklet Issued: Yes (comment) Precaution Comments: handout given yesterday, still struggles with functional application Restrictions Weight Bearing Restrictions: No    Mobility  Bed Mobility               General bed mobility comments: Pt seated EOB, dressed with back brace donned.   Transfers Overall transfer level: Needs assistance Equipment used: Rolling walker (2 wheeled) Transfers: Sit to/from [facility] Sit to Stand: Min assist Stand pivot transfers: Min assist       General transfer comment: Min assist to support trunk during transition.  Min assist to steady pt for stand pivot transfer from bed to transport chair and from transport chair to car (sed[place]), continued cues for safety (pt trying to step into the car instead of what PT was trying to get her to do which was back up to the seat, sit  down and then bring her legs into the car).           Cognition Arousal/Alertness: Awake/alert Behavior During Therapy: Impulsive Overall Cognitive Status: Impaired/Different from baseline                     Current Attention Level: Selective Memory: Decreased recall of precautions   Safety/Judgement: Decreased awareness of safety Awareness: Emergent   General Comments: Pt continues to need significant reinforcement of safety, to slow down.  She completely disreguards safety cues with "yeah" and then keeps doing what she was doing that was unsafe.              Pertinent Vitals/Pain Pain Assessment: Faces Faces Pain Scale: Hurts even more Pain Location: incisional Pain Descriptors / Indicators: Aching Pain Intervention(s): Limited activity within patient's tolerance;Monitored during session;Repositioned           PT Goals (current goals can now be found in the care plan section) Acute Rehab PT Goals Patient Stated Goal: to go home as soon as she is allowed Progress towards PT goals: Progressing toward goals    Frequency    Min 5X/week      PT Plan Current plan remains appropriate       AM-PAC PT "6 Clicks" Mobility   Outcome Measure  Help needed turning from your back to your side while in a flat bed without using bedrails?: None Help needed moving from lying on your back to sitting on the side of a flat bed without using bedrails?: A  Little Help needed moving to and from a bed to a chair (including a wheelchair)?: A Little Help needed standing up from a chair using your arms (e.g., wheelchair or bedside chair)?: A Little Help needed to walk in hospital room?: A Little Help needed climbing 3-5 steps with a railing? : A Little 6 Click Score: 19    End of Session Equipment Utilized During Treatment: Back brace Activity Tolerance: Patient limited by pain;Patient limited by fatigue Patient left: Other (comment) (in car with her mother) Nurse  Communication: Mobility status PT Visit Diagnosis: Muscle weakness (generalized) (M62.81);Difficulty in walking, not elsewhere classified (R26.2);Pain Pain - Right/Left:  (incisional) Pain - part of body:  (back)     Time: 8016-5537 PT Time Calculation (min) (ACUTE ONLY): 12 min  Charges:  $Therapeutic Activity: 8-22 mins                    Verdene Lennert, PT, DPT  Acute Rehabilitation [phone] pager #[phone] office     04/26/2020, 1:58 PM

## 2020-04-26 NOTE — Progress Notes (Signed)
Occupational Therapy Treatment Patient Details Name: Alicia Blake MRN: [id number] DOB: 1966-01-14 Today's Date: 04/26/2020    History of present illness 54 y.o female s/p L5/S1 lumbar decompression and arthrodesis. PMH includes but not limited to, bipolar affect, chronic back pain, COPD, GERD.    OT comments  Pt and mother feel that they are ready to d/c home based on this session of adls. Mother educated on bathing around wounds and not directly on the wound with clean linen. Mother reports that she has purchase brand new white wash clothes and towels for the patient to use. Mother also reports that patient has a female friend that has promised to come in a moments notice if needed to help with any transfer for the patient.    Follow Up Recommendations  Home health OT;Supervision/Assistance - 24 hour    Equipment Recommendations  3 in 1 bedside commode    Recommendations for Other Services      Precautions / Restrictions Precautions Precautions: Back Precaution Booklet Issued: Yes (comment) Precaution Comments: able to recall back precautions but needs cues for during functional activtiy Required Braces or Orthoses: Spinal Brace Spinal Brace: Lumbar corset;Applied in sitting position       Mobility Bed Mobility               General bed mobility comments: in chair on arrival  Transfers Overall transfer level: Needs assistance Equipment used: Rolling walker (2 wheeled) Transfers: Sit to/from Stand Sit to Stand: Min guard Stand pivot transfers: Min assist       General transfer comment: heavy use of arms and rocking momentum to power up. pt with uncontrolled descend to cahir    Balance Overall balance assessment: Needs assistance Sitting-balance support: No upper extremity supported;Feet supported Sitting balance-Leahy Scale: Good     Standing balance support: Bilateral upper extremity supported;During functional activity Standing balance-Leahy Scale:  Poor Standing balance comment: reliant on BIL UE                           ADL either performed or assessed with clinical judgement   ADL Overall ADL's : Needs assistance/impaired     Grooming: Set up;Sitting           Upper Body Dressing : Modified independent;Sitting   Lower Body Dressing: Supervision/safety;Sit to/from stand   Toilet Transfer: Consulting civil engineer Details (indicate cue type and reason): simulated with sit<.stand from chair           General ADL Comments: pt dressing and becoming very sweaty with dressing task. pt with increased time and effort to don bra. pt needs about 10 attempts to spin bra from front to back due to body habitus.      Vision       Perception     Praxis      Cognition Arousal/Alertness: Awake/alert Behavior During Therapy: WFL for tasks assessed/performed Overall Cognitive Status: Impaired/Different from baseline                     Current Attention Level: Selective Memory: Decreased recall of precautions   Safety/Judgement: Decreased awareness of safety Awareness: Emergent Problem Solving: Slow processing General Comments: mother reports cognition is not at baseline. pt initially state "i can't take her home like these" after working with patient with mother present mother reports being able to take patient home        Exercises     Shoulder Instructions  General Comments mom present for entire session    Pertinent Vitals/ Pain       Pain Assessment: No/denies pain Faces Pain Scale: Hurts even more Pain Location: incisional Pain Descriptors / Indicators: Aching Pain Intervention(s): Limited activity within patient's tolerance;Monitored during session;Repositioned  Home Living                                          Prior Functioning/Environment              Frequency  Min 2X/week        Progress Toward Goals  OT Goals(current goals  can now be found in the care plan section)  Progress towards OT goals: Progressing toward goals  Acute Rehab OT Goals Patient Stated Goal: to go home today OT Goal Formulation: With patient/family Time For Goal Achievement: 05/09/20 Potential to Achieve Goals: Good ADL Goals Pt Will Perform Grooming: with modified independence;standing Pt Will Perform Upper Body Dressing: with modified independence;sitting Pt Will Perform Lower Body Dressing: with modified independence;with adaptive equipment;sit to/from stand Pt Will Transfer to Toilet: with modified independence;ambulating Additional ADL Goal #1: Pt will demonstrate adherence to 3/3 back precautions during ADL/IADL completion.  Plan Discharge plan remains appropriate    Co-evaluation                 AM-PAC OT "6 Clicks" Daily Activity     Outcome Measure   Help from another person eating meals?: A Little Help from another person taking care of personal grooming?: A Little Help from another person toileting, which includes using toliet, bedpan, or urinal?: A Little Help from another person bathing (including washing, rinsing, drying)?: A Little Help from another person to put on and taking off regular upper body clothing?: A Little Help from another person to put on and taking off regular lower body clothing?: A Lot 6 Click Score: 17    End of Session Equipment Utilized During Treatment: Gait belt;Rolling walker;Back brace  OT Visit Diagnosis: Other abnormalities of gait and mobility (R26.89);Muscle weakness (generalized) (M62.81);Other symptoms and signs involving cognitive function;Pain   Activity Tolerance Patient tolerated treatment well   Patient Left in chair;with call bell/phone within reach;with family/visitor present;Other (comment) (Cm arriving to address home needs)   Nurse Communication Mobility status        Time: 1101 (1110)-1131 OT Time Calculation (min): 30 min  Charges: OT General Charges $OT  Visit: 1 Visit OT Treatments $Self Care/Home Management : 23-37 mins   Brynn, OTR/L  Acute Rehabilitation Services Pager: [phone] Office: [phone] .    Jeri Modena 04/26/2020, 3:40 PM

## 2020-04-26 NOTE — Discharge Summary (Signed)
Physician Discharge Summary  Patient ID: Alicia Blake MRN: [id number] DOB/AGE: September 23, 1966 54 y.o.  Admit date: 04/21/2020 Discharge date: 04/26/2020  Admission Diagnoses:lumbar foraminal stenosis, L5/S1  Discharge Diagnoses:  Active Problems:   Lumbar foraminal stenosis   Discharged Condition: good  Hospital Course: Alicia Blake was admitted and taken to the operating room for a lumbar decompression and arthrodesis. Post op she was on bedrest then was ambulating. Her wound is clean, dry, without signs of infection. She is tolerating a regular diet.   Treatments: surgery: Plif L5/S1 with nuvasive screws, Synthes cages   Discharge Exam: Blood pressure (!) 108/51, pulse 91, temperature 98.8 F (37.1 C), temperature source Oral, resp. rate 14, height 5\' 6"  (1.676 m), weight 109.8 kg, last menstrual period 12/09/2015, SpO2 96 %. 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  Allergies as of 04/26/2020      Reactions   Risperidone And Related Swelling, Other (See Comments)   Facial swelling   Buspirone Swelling   Meloxicam Other (See Comments)   UNABLE TO REMEMBER   Prednisone Other (See Comments)   She reported history of mouth swelling with prednisone   Gabapentin Nausea And Vomiting, Swelling   Speech impairment  Patient denies allergy      Medication List    STOP taking these medications   ibuprofen 800 MG tablet Commonly known as: ADVIL     TAKE these medications   Abilify Maintena 400 MG Prsy prefilled syringe Generic drug: ARIPiprazole ER Inject 400 mg into the muscle every 30 (thirty) days.   albuterol (2.5 MG/3ML) 0.083% nebulizer solution Commonly known as: PROVENTIL Take 2.5 mg by nebulization every 6 (six) hours as needed for wheezing or shortness of breath.   albuterol 108 (90 Base)  MCG/ACT inhaler Commonly known as: VENTOLIN HFA Inhale 2 puffs into the lungs every 6 (six) hours as needed for wheezing.   atomoxetine 40 MG capsule Commonly known as: STRATTERA Take 40 mg by mouth daily.   CALCIUM 600/VITAMIN D3 PO Take 1 tablet by mouth daily.   cetirizine 10 MG tablet Commonly known as: ZYRTEC Take 10 mg by mouth daily.   dicyclomine 20 MG tablet Commonly known as: BENTYL Take 20 mg by mouth 4 (four) times daily.   esomeprazole 40 MG capsule Commonly known as: NEXIUM Take 40 mg by mouth daily.   fluticasone 50 MCG/ACT nasal spray Commonly known as: FLONASE Place 1 spray into both nostrils daily.   Fluticasone-Salmeterol 100-50 MCG/DOSE Aepb Commonly known as: ADVAIR Inhale 1 puff into the lungs at bedtime.   furosemide 20 MG tablet Commonly known as: LASIX Take 20-60 mg by mouth daily as needed for fluid or edema.   hydrOXYzine 25 MG capsule Commonly known as: VISTARIL Take 25 mg by mouth 3 (three) times daily.   lithium carbonate 300 MG capsule Take 600 mg by mouth at bedtime.   losartan 25 MG tablet Commonly known as: COZAAR Take 25 mg by mouth daily.   multivitamin tablet Take 1 tablet by mouth daily.   ondansetron 4 MG tablet Commonly known as: ZOFRAN Take 4 mg by mouth every 6 (six) hours as needed for nausea or vomiting.   oxyCODONE 5 MG immediate release tablet Commonly known as: Oxy IR/ROXICODONE Take 1 tablet (5 mg total) by mouth every 6 (six) hours as needed for up to 7 days  for moderate pain ((score 4 to 6)).   PARoxetine 40 MG tablet Commonly known as: PAXIL Take 40 mg by mouth daily.   QUEtiapine 200 MG tablet Commonly known as: SEROQUEL Take 200 mg by mouth at bedtime.   TEGretol 200 MG tablet Generic drug: carbamazepine Take 400 mg by mouth in the morning and at bedtime.   tiZANidine 4 MG tablet Commonly known as: ZANAFLEX Take 4 mg by mouth every 8 (eight) hours as needed for muscle spasms.   triamcinolone  cream 0.5 % Commonly known as: KENALOG Apply 1 application topically 2 (two) times daily as needed (irritation).   vitamin B-12 500 MCG tablet Commonly known as: CYANOCOBALAMIN Take 500 mcg by mouth daily.       Follow-up Information    Ashok Pall, MD Follow up in 1 week(s).   Specialty: Neurosurgery Why: please call the office to make an appointment for suture removal Contact information: [address] [place] [address] [phone]               Signed: Ashok Pall 04/26/2020, 10:52 AM

## 2020-04-26 NOTE — Plan of Care (Signed)
  Problem: Health Behavior/Discharge Planning: Goal: Ability to manage health-related needs will improve Note: Received discharge order for patient. Discharge instructions reviewed with patient and her mother, including new medication/ prescriptions,  activity, follow-up appointment, wound care, and any special instructions. Per case manager she will follow up with patient on HHPT. At this time patient and mother have stated understanding of discharge instructions no further questions at this time. Pt stable in no acute distress. PIV has been removed. Pt off unit in wheelchair with PT. Nursing care complete.

## 2020-04-26 NOTE — Discharge Instructions (Signed)

## 2020-04-26 NOTE — TOC Transition Note (Addendum)
Transition of Care (TOC) - CM/SW Discharge Note Marvetta Gibbons RN,BSN Transitions of Care Unit 4NP (non trauma) - RN Case Manager See Treatment Team for direct Phone #   Patient Details  Name: Alicia Blake MRN: [id number] Date of Birth: 1965-11-14  Transition of Care [facility]) CM/SW Contact:  Dawayne Patricia, RN Phone Number: 04/26/2020, 2:32 PM   Clinical Narrative:    Pt stable for transition home today, orders placed for [facility] and DME needs- CM spoke with pt and mom at the bedside for transition needs- address, phone # and PCP all confirmed in epic. List provided to pt for [facility] choice Per CMS guidelines from medicare.gov website with star ratings (copy placed in shadow chart)- per pt and mom they do not have a preference- per pt she has an aide that comes 4/hrs 4days/week- with [facility] services with Medicaid.  Pt will need RW and 3n1 for home, mom to transport home.   Call made to [facility] with [facility] for DME needs- RW and 3n1 to be delivered to room prior to discharge.   Calls made to multiple [facility] agencies to try and secure HH servies.  [facility]- unable to accept at this time [facility] Staffing tight- not taking Medicaid at this time [facility]- unable to accept [facility] - at capacity for Medicaid [facility]- delayed for PT start of care- 7 days [facility] delayed start of care 2 weeks [facility]- no PT available [facility]- delayed start of care- unable to accept [facility]- unable to accept [facility]- delayed start of care for PT   Explored option of outpt therapies with pt/mom due to difficulties finding an agency- however they do not have good transportation for outpt therapy- will keep trying to find [facility] agency for services.   [facility]- no staffing available for PT [facility]- unable to accept due to staffing  [facility]- able to accept but for HHPT only- do not have OT services available- will modify HH for  PT only and they can do start of care this week. - call made to patient to let her know [facility] would be calling her to set up visits.    Final next level of care: Home/Self Care Barriers to Discharge: No Barriers Identified, No Home Care Agency will accept this patient   Patient Goals and CMS Choice Patient states their goals for this hospitalization and ongoing recovery are:: return home CMS Medicare.gov Compare Post Acute Care list provided to:: Patient Choice offered to / list presented to : Patient, Parent  Discharge Placement                 home with [facility]      Discharge Plan and Services   Discharge Planning Services: CM Consult Post Acute Care Choice: Home Health, Durable Medical Equipment          DME Arranged: 3-N-1, Walker rolling DME Agency: [facility] Date DME Agency Contacted: 04/26/20 Time DME Agency Contacted: [phone] Representative spoke with at DME Agency: Thedore Mins HH Arranged: PT, OT          Social Determinants of Health ([place]) Interventions     Readmission Risk Interventions Readmission Risk Prevention Plan 04/26/2020  Transportation Screening Complete  PCP or Specialist Appt within 5-7 Days Complete  Home Care Screening Complete  Medication Review (RN CM) Complete  Some recent data might be hidden

## 2020-05-02 NOTE — Op Note (Signed)
04/21/2020  5:41 PM  PATIENT:  Alicia Blake  54 y.o. female with significant foraminal and lateral recess stenosis on the left at L5/S1. She has agreed to an operative decompression and arthrodesis.   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  BLOOD ADMINISTERED:none  CELL SAVER GIVEN:none  COUNT:per nursing  DRAINS: none   SPECIMEN:  No Specimen  DICTATION: [name] is a 54 y.o. female whom was taken to the operating room intubated, and placed under a general anesthetic without difficulty. A foley catheter was placed under sterile conditions. She was positioned prone on a Jackson table with all pressure points properly padded.  His lumbar region was prepped and draped in a sterile manner. I infiltrated 10cc's 1/2%lidocaine/1:2000,000 strength epinephrine into the planned incision. I opened the skin with a 10 blade and took the incision down to the thoracolumbar fascia. I exposed the lamina of L4,5 and S1 in a subperiosteal fashion bilaterally. I confirmed my location with an intraoperative xray.  I placed self retaining retractors and started the decompression.  I decompressed the spinal canal and the left lateral recess and foramen via an aggressive laminectomy of L5, and inferior facetectomy of L5, and partial facetectomy of S1. I used the drill and Kerrison punches PLIF's were performed at L5/S1 in the same fashion. I opened the disc space with a 15 blade then used a variety of instruments to remove the disc and prepare the space  for the arthrodesis. I used curettes, rongeurs, punches, shavers for the disc space, and rasps in the discetomy. I measured the disc space and placed Synthes Titanium cages(Synthes) into the disc space(s).  I decorticated the lateral bone at L5/S1. I then placed autograft morsels on the decorticated surfaces to complete the posterolateral arthrodesis.  We placed pedicle screws at L5 and S1, using fluoroscopic guidance. I drilled a pilot hole, then cannulated the pedicle with a drill at each site. We then tapped each pedicle, assessing each site for pedicle violations. No cutouts were appreciated. Screws Harlin Heys) were then placed at each site without difficulty. We attached rods and locking caps with the appropriate tools. The locking caps were secured with torque limited screwdrivers. Final films were performed and the final construct appeared to be in good position.  We closed the wound in a layered fashion. We approximated the thoracolumbar fascia, subcutaneous, and subcuticular planes with vicryl sutures. I used dermabond and an occlusive bandage for a sterile dressing.     PLAN OF CARE: Admit to inpatient   PATIENT DISPOSITION:  PACU - hemodynamically stable.   Delay start of Pharmacological VTE agent (>24hrs) due to surgical blood loss or risk of bleeding:  yes

## 2020-09-28 ENCOUNTER — Other Ambulatory Visit: Payer: Self-pay | Admitting: Orthopedic Surgery

## 2020-09-28 DIAGNOSIS — M545 Low back pain, unspecified: Secondary | ICD-10-CM

## 2020-10-17 ENCOUNTER — Ambulatory Visit
Admission: RE | Admit: 2020-10-17 | Discharge: 2020-10-17 | Disposition: A | Discharge status: Home / Self Care | Payer: Medicaid Other | Source: Ambulatory Visit | Attending: Orthopedic Surgery | Admitting: Orthopedic Surgery

## 2020-10-17 ENCOUNTER — Other Ambulatory Visit: Payer: Self-pay

## 2020-10-17 DIAGNOSIS — M545 Low back pain, unspecified: Secondary | ICD-10-CM

## 2021-03-15 ENCOUNTER — Other Ambulatory Visit: Payer: Self-pay | Admitting: Internal Medicine

## 2021-03-16 LAB — LIPID PANEL
Cholesterol: 137 mg/dL (ref <200)
HDL: 38 mg/dL — ABNORMAL LOW (ref >=50)
LDL Cholesterol (Calc): 76 mg/dL (calc)
Non-HDL Cholesterol (Calc): 99 mg/dL (calc) (ref <130)
Total CHOL/HDL Ratio: 3.6 (calc) (ref <5.0)
Triglycerides: 151 mg/dL — ABNORMAL HIGH (ref <150)

## 2021-03-16 LAB — COMPLETE METABOLIC PANEL WITH GFR
AG Ratio: 0.9 (calc) — ABNORMAL LOW (ref 1.0–2.5)
ALT: 7 U/L (ref 6–29)
AST: 10 U/L (ref 10–35)
Albumin: 3.4 g/dL — ABNORMAL LOW (ref 3.6–5.1)
Alkaline phosphatase (APISO): 85 U/L (ref 37–153)
BUN: 9 mg/dL (ref 7–25)
CO2: 24 mmol/L (ref 20–32)
Calcium: 8.7 mg/dL (ref 8.6–10.4)
Chloride: 103 mmol/L (ref 98–110)
Creat: 0.87 mg/dL (ref 0.50–1.05)
GFR, Est African American: 87 mL/min/{1.73_m2} (ref >=60)
GFR, Est Non African American: 75 mL/min/{1.73_m2} (ref >=60)
Globulin: 3.8 g/dL (calc) — ABNORMAL HIGH (ref 1.9–3.7)
Glucose, Bld: 99 mg/dL (ref 65–99)
Potassium: 3.7 mmol/L (ref 3.5–5.3)
Sodium: 137 mmol/L (ref 135–146)
Total Bilirubin: 0.4 mg/dL (ref 0.2–1.2)
Total Protein: 7.2 g/dL (ref 6.1–8.1)

## 2021-03-16 LAB — URINE CULTURE

## 2021-03-16 LAB — CLIENT EDUCATION TRACKING

## 2021-03-16 LAB — PAP IG W/ RFLX HPV ASCU

## 2021-03-16 LAB — VITAMIN D 25 HYDROXY (VIT D DEFICIENCY, FRACTURES): Vit D, 25-Hydroxy: 37 ng/mL (ref 30–100)

## 2021-03-16 LAB — CBC
HCT: 36.6 % (ref 35.0–45.0)
Hemoglobin: 11.7 g/dL (ref 11.7–15.5)
MCH: 29.2 pg (ref 27.0–33.0)
MCHC: 32 g/dL (ref 32.0–36.0)
MCV: 91.3 fL (ref 80.0–100.0)
MPV: 11.2 fL (ref 7.5–12.5)
Platelets: 215 10*3/uL (ref 140–400)
RBC: 4.01 10*6/uL (ref 3.80–5.10)
RDW: 13.4 % (ref 11.0–15.0)
WBC: 6.5 10*3/uL (ref 3.8–10.8)

## 2021-03-16 LAB — TSH: TSH: 2.48 mIU/L

## 2021-03-16 LAB — C. TRACHOMATIS/N. GONORRHOEAE RNA
C. trachomatis RNA, TMA: NOT DETECTED
N. gonorrhoeae RNA, TMA: NOT DETECTED

## 2021-03-20 ENCOUNTER — Other Ambulatory Visit: Payer: Self-pay | Admitting: Internal Medicine

## 2021-03-20 DIAGNOSIS — E2839 Other primary ovarian failure: Secondary | ICD-10-CM

## 2021-04-26 ENCOUNTER — Other Ambulatory Visit: Payer: Self-pay | Admitting: Internal Medicine

## 2021-04-26 DIAGNOSIS — Z1231 Encounter for screening mammogram for malignant neoplasm of breast: Secondary | ICD-10-CM

## 2021-05-03 ENCOUNTER — Other Ambulatory Visit: Payer: Medicaid Other

## 2021-05-03 ENCOUNTER — Ambulatory Visit: Payer: Medicaid Other

## 2021-05-09 ENCOUNTER — Encounter: Payer: Self-pay | Admitting: Gastroenterology

## 2021-05-10 ENCOUNTER — Ambulatory Visit
Admission: RE | Admit: 2021-05-10 | Discharge: 2021-05-10 | Disposition: A | Discharge status: Home / Self Care | Payer: Medicaid Other | Source: Ambulatory Visit | Attending: Internal Medicine | Admitting: Internal Medicine

## 2021-05-10 ENCOUNTER — Other Ambulatory Visit: Payer: Self-pay

## 2021-05-10 DIAGNOSIS — Z1231 Encounter for screening mammogram for malignant neoplasm of breast: Secondary | ICD-10-CM

## 2021-06-09 ENCOUNTER — Encounter (HOSPITAL_COMMUNITY): Payer: Self-pay

## 2021-06-09 ENCOUNTER — Ambulatory Visit (HOSPITAL_COMMUNITY)
Admission: RE | Admit: 2021-06-09 | Discharge: 2021-06-09 | Disposition: A | Discharge status: Home / Self Care | Payer: Medicaid Other | Source: Ambulatory Visit | Attending: Physician Assistant | Admitting: Physician Assistant

## 2021-06-09 ENCOUNTER — Other Ambulatory Visit: Payer: Self-pay

## 2021-06-09 VITALS — BP 106/59 | HR 98 | Temp 98.3°F | Resp 18

## 2021-06-09 DIAGNOSIS — K5732 Diverticulitis of large intestine without perforation or abscess without bleeding: Secondary | ICD-10-CM | POA: Diagnosis not present

## 2021-06-09 DIAGNOSIS — K625 Hemorrhage of anus and rectum: Secondary | ICD-10-CM | POA: Diagnosis not present

## 2021-06-09 NOTE — ED Provider Notes (Signed)
[facility]    CSN: [id number] Arrival date & time: 06/09/21  1505      History   Chief Complaint Chief Complaint  Patient presents with  . Abdominal Pain  . Blood In Stools  . appt was 15:00    HPI Alicia Blake is a 55 y.o. female presenting for multiple complaints, including severe abd pain and blood in stool.  Medical history diverticulitis, hypertension, GERD, scoliosis, seizures, asthma, prediabetes, schizoaffective, opioid use disorder, alcohol use disorder, cocaine use disorder, cannabis use disorder.  Patient states she has never experienced blood in her stool before, and has never had an issue with the diverticulitis in the past.  She has never had a colonoscopy, and no recent imaging of the abdomen.  Describes bright red blood in the stool and on toilet paper, getting worse.  Crampy lower abdominal pain, left lower worse than right.  Subjective chills.  Does also note foul odor to urine and concern for STI. States that the health department told her to go to urgent care.  HPI  Past Medical History:  Diagnosis Date  . Asthma   . Bipolar affect, depressed ([place])   . Bronchitis   . Chronic back pain   . COPD (chronic obstructive pulmonary disease) ([place])   . DJD (degenerative joint disease)    BACK  . GERD (gastroesophageal reflux disease)   . Hypertension   . Pneumonia   . Pre-diabetes    dr entered diabetes without complications- no meds does not test sugar at home  . PTSD (post-traumatic stress disorder)   . Scoliosis   . Seizures ([place])    "when drinking" last yrs ago  . Shortness of breath dyspnea    exersion  . Thyroid disease   . UTI (lower urinary tract infection)     Patient Active Problem List   Diagnosis Date Noted  . Lumbar foraminal stenosis 04/21/2020  . Lumbar stenosis with neurogenic claudication 06/18/2017  . Tobacco abuse 04/19/2016  . Primary osteoarthritis of left hip 04/18/2016  . Opioid use disorder, mild, abuse ([place])  09/08/2015  . Schizoaffective disorder, bipolar type ([place]) 09/07/2015  . Chronic pain syndrome 09/07/2015  . Alcohol use disorder, moderate, in sustained remission ([place]) 09/07/2015  . Cocaine use disorder, moderate, in sustained remission ([place]) 09/07/2015  . Cannabis use disorder, moderate, in sustained remission ([place]) 09/07/2015  . DDD (degenerative disc disease), lumbar 09/07/2015  . Hx of fracture of ankle 09/07/2015  . Symptomatic Fibroids 08/09/2013  . Breast lump on left side at 9 o'clock position 06/29/2013  . Breast discharge 06/29/2013    Past Surgical History:  Procedure Laterality Date  . 2 LEFT TOES SURGERY  12/2014   finished only on right side  . BACK SURGERY    . BALLOON DILATION N/A 12/14/2015   Procedure: BALLOON DILATION;  Surgeon: Teena Irani, MD;  Location: [facility];  Service: Endoscopy;  Laterality: N/A;  . COLONOSCOPY WITH PROPOFOL N/A 12/14/2015   Procedure: COLONOSCOPY WITH PROPOFOL;  Surgeon: Teena Irani, MD;  Location: [facility];  Service: Endoscopy;  Laterality: N/A;  . ESOPHAGOGASTRODUODENOSCOPY (EGD) WITH PROPOFOL N/A 12/14/2015   Procedure: ESOPHAGOGASTRODUODENOSCOPY (EGD) WITH PROPOFOL;  Surgeon: Teena Irani, MD;  Location: [facility];  Service: Endoscopy;  Laterality: N/A;  . JOINT REPLACEMENT     left hip  . LEFT ANKLE SURGERY  MARCH 2016  . LUMBAR LAMINECTOMY/DECOMPRESSION MICRODISCECTOMY N/A 06/18/2017   Procedure: LAMINECTOMY AND FORAMINOTOMY LUMBAR FOUR - LUMBAR FIVE;  Surgeon: Ashok Pall, MD;  Location: [facility];  Service: Neurosurgery;  Laterality: N/A;  LAMINECTOMY AND FORAMINOTOMY LUMBAR 4- LUMBAR 5  . LUMBAR WOUND DEBRIDEMENT N/A 03/10/2020   Procedure: Lumbar Wound Debridement;  Surgeon: Ashok Pall, MD;  Location: [facility];  Service: Neurosurgery;  Laterality: N/A;  posterior  . NECK SURGERY  AS TEENAGER   STITCHES TO NECK   . SURGERY FOR CUT ON STOMACH  TEENAGER  . TOTAL HIP ARTHROPLASTY Left 05/14/2016   Procedure: TOTAL HIP ARTHROPLASTY  ANTERIOR APPROACH;  Surgeon: Renette Butters, MD;  Location: [facility];  Service: Orthopedics;  Laterality: Left;    OB History     Gravida  1   Para  1   Term  1   Preterm      AB      Living  1      SAB      IAB      Ectopic      Multiple      Live Births               Home Medications    Prior to Admission medications   Medication Sig Start Date End Date Taking? Authorizing Provider  albuterol (VENTOLIN HFA) 108 (90 Base) MCG/ACT inhaler Inhale 2 puffs into the lungs every 6 (six) hours as needed for wheezing. 10/11/19  Yes Lamptey, Myrene Galas, MD  atomoxetine (STRATTERA) 40 MG capsule Take 40 mg by mouth daily. 09/18/19  Yes Elbert Ewings, FNP  cetirizine (ZYRTEC) 10 MG tablet Take 10 mg by mouth daily. 11/02/19  Yes [provider]  esomeprazole (NEXIUM) 40 MG capsule Take 40 mg by mouth daily. 09/22/19  Yes [provider]  furosemide (LASIX) 20 MG tablet Take 20-60 mg by mouth daily as needed for fluid or edema.   Yes [provider]  hydrOXYzine (VISTARIL) 25 MG capsule Take 25 mg by mouth 3 (three) times daily. 06/16/19  Yes [provider]  lithium carbonate 300 MG capsule Take 600 mg by mouth at bedtime.  09/18/19  Yes [provider]  losartan (COZAAR) 25 MG tablet Take 25 mg by mouth daily. 09/18/19  Yes Trey Sailors, PA  PARoxetine (PAXIL) 40 MG tablet Take 40 mg by mouth daily.   Yes [provider]  QUEtiapine (SEROQUEL) 200 MG tablet Take 200 mg by mouth at bedtime.    Yes [provider]  albuterol (PROVENTIL) (2.5 MG/3ML) 0.083% nebulizer solution Take 2.5 mg by nebulization every 6 (six) hours as needed for wheezing or shortness of breath.    [provider]  Calcium Carb-Cholecalciferol (CALCIUM 600/VITAMIN D3 PO) Take 1 tablet by mouth daily.    [provider]  dicyclomine (BENTYL) 20 MG tablet Take 20 mg by mouth 4 (four) times daily. 02/25/20   [provider]  fluticasone (FLONASE) 50 MCG/ACT nasal spray Place 1 spray into both nostrils daily.    [provider]  Fluticasone-Salmeterol (ADVAIR) 100-50 MCG/DOSE AEPB Inhale 1 puff into the lungs at bedtime.    [provider]  Multiple Vitamin (MULTIVITAMIN) tablet Take 1 tablet by mouth daily.    [provider]  tiZANidine (ZANAFLEX) 4 MG tablet Take 4 mg by mouth every 8 (eight) hours as needed for muscle spasms.     [provider]  vitamin B-12 (CYANOCOBALAMIN) 500 MCG tablet Take 500 mcg by mouth daily.    [provider]    Family History Family History  Problem Relation Age of Onset  .  Hypertension Mother   . Hypertension Maternal Grandmother   . Hypertension Paternal Grandmother   . Diabetes Paternal Grandmother   . Hypertension Paternal Grandfather   . Diabetes Paternal Grandfather   . Alcohol abuse Paternal Aunt   . Mental illness Cousin   . Heart attack Cousin   . Heart attack Maternal Uncle     Social History Social History   Tobacco Use  . Smoking status: Every Day    Packs/day: 1.00    Years: 26.00    Pack years: 26.00    Types: Cigarettes  . Smokeless tobacco: Never  Vaping Use  . Vaping Use: Never used  Substance Use Topics  . Alcohol use: No    Comment: quit date 01/17/2013  . Drug use: No    Types: "Crack" cocaine, Heroin    Comment: quit date 04-17-2014 FOR CRACK     Allergies   Risperidone and related, Buspirone, Meloxicam, Prednisone, and Gabapentin   Review of Systems Review of Systems  Constitutional:  Negative for appetite change, chills, diaphoresis, fever and unexpected weight change.  HENT:  Negative for congestion, ear pain, sinus pressure, sinus pain, sneezing, sore throat and trouble swallowing.   Respiratory:  Negative for cough, chest tightness and shortness of breath.   Cardiovascular:  Negative for chest pain.  Gastrointestinal:  Positive for abdominal pain and blood in stool.  Negative for abdominal distention, anal bleeding, constipation, diarrhea, nausea, rectal pain and vomiting.  Genitourinary:  Negative for dysuria, flank pain, frequency and urgency.  Musculoskeletal:  Negative for back pain and myalgias.  Neurological:  Negative for dizziness, light-headedness and headaches.  All other systems reviewed and are negative.   Physical Exam Triage Vital Signs ED Triage Vitals  Enc Vitals Group     BP 06/09/21 1540 (!) 106/59     Pulse Rate 06/09/21 1540 98     Resp 06/09/21 1548 18     Temp 06/09/21 1540 98.3 F (36.8 C)     Temp Source 06/09/21 1540 Oral     SpO2 06/09/21 1540 97 %     Weight --      Height --      Head Circumference --      Peak Flow --      Pain Score 06/09/21 1541 9     Pain Loc --      Pain Edu? --      Excl. in [place]? --    No data found.  Updated Vital Signs BP (!) 106/59 (BP Location: Right Arm)   Pulse 98   Temp 98.3 F (36.8 C) (Oral)   Resp 18   LMP 12/09/2015 Comment: very irregular  SpO2 97%   Visual Acuity Right Eye Distance:   Left Eye Distance:   Bilateral Distance:    Right Eye Near:   Left Eye Near:    Bilateral Near:     Physical Exam Vitals reviewed.  Constitutional:      General: She is not in acute distress.    Appearance: Normal appearance. She is obese. She is not ill-appearing.  HENT:     Head: Normocephalic and atraumatic.     Mouth/Throat:     Mouth: Mucous membranes are moist.     Comments: Moist mucous membranes Eyes:     Extraocular Movements: Extraocular movements intact.     Pupils: Pupils are equal, round, and reactive to light.  Cardiovascular:     Rate and Rhythm: Normal rate and regular rhythm.  Heart sounds: Normal heart sounds.  Pulmonary:     Effort: Pulmonary effort is normal.     Breath sounds: Normal breath sounds. No wheezing, rhonchi or rales.  Abdominal:     General: Bowel sounds are normal. There is no distension.     Palpations: Abdomen is soft. There is no  mass.     Tenderness: There is abdominal tenderness in the right lower quadrant and left lower quadrant. There is no right CVA tenderness, left CVA tenderness, guarding or rebound. Negative signs include Murphy's sign, Rovsing's sign and McBurney's sign.     Comments: TTP  LLQ > RLQ. BS full throughout. No guarding or rebound.  Skin:    General: Skin is warm.     Capillary Refill: Capillary refill takes less than 2 seconds.     Comments: Good skin turgor  Neurological:     General: No focal deficit present.     Mental Status: She is alert and oriented to person, place, and time.  Psychiatric:        Mood and Affect: Mood normal.        Behavior: Behavior normal.     UC Treatments / Results  Labs (all labs ordered are listed, but only abnormal results are displayed) Labs Reviewed - No data to display  EKG   Radiology No results found.  Procedures Procedures (including critical care time)  Medications Ordered in UC Medications - No data to display  Initial Impression / Assessment and Plan / UC Course  I have reviewed the triage vital signs and the nursing notes.  Pertinent labs & imaging results that were available during my care of the patient were reviewed by me and considered in my medical decision making (see chart for details).     This patient is a very pleasant 55 y.o. year old female presenting with rectal bleeding and abdominal pain. History diverticulitis. Has never had colonoscopy, no recent CT abd. Afebrile, borderline tachy today.   Sent to ED for further evaluation and management.  Discussed treatment plan with attending physician Dr. Mannie Stabile who is in agreement.   ED return precautions discussed. Patient verbalizes understanding and agreement.   Final Clinical Impressions(s) / UC Diagnoses   Final diagnoses:  Diverticulitis of colon  Rectal bleeding     Discharge Instructions      -Please head to the ED for evaluation of severe abdominal pain and  rectal bleeding. I'm concerned you are having a diverticulitis flare, which can lead to a severe infection. This requires imaging that we cannot do in urgent care. Please head straight there -In the future, please regularly follow-up with your PCP to discuss your multiple chronic conditions and acute issues     ED Prescriptions   None    PDMP not reviewed this encounter.   Hazel Sams, PA-C 06/09/21 1606

## 2021-06-09 NOTE — ED Notes (Signed)
Patient is being discharged from the Urgent Care and sent to the Emergency Department via pov . Per laura graham, pa, patient is in need of higher level of care due to gi bleed, abdominal pain. Patient is aware and verbalizes understanding of plan of care.  Vitals:   06/09/21 1540 06/09/21 1548  BP: (!) 106/59   Pulse: 98   Resp:  18  Temp: 98.3 F (36.8 C)   SpO2: 97%

## 2021-06-09 NOTE — ED Triage Notes (Addendum)
Lower abdominal pain up to back pain for 2 to 3 weeks, mouth lesions for 2-3 weeks, later some odor in your urine, history of diverticulitis, recently had an sexual encounter possible exposure, blood in the stool for the first time. Vaginal discharge away from baseline.

## 2021-06-09 NOTE — Discharge Instructions (Addendum)
-  Please head to the ED for evaluation of severe abdominal pain and rectal bleeding. I'm concerned you are having a diverticulitis flare, which can lead to a severe infection. This requires imaging that we cannot do in urgent care. Please head straight there -In the future, please regularly follow-up with your PCP to discuss your multiple chronic conditions and acute issues

## 2021-06-27 ENCOUNTER — Encounter: Payer: Self-pay | Admitting: Internal Medicine

## 2021-07-06 ENCOUNTER — Encounter: Payer: Self-pay | Admitting: Internal Medicine

## 2021-07-06 ENCOUNTER — Ambulatory Visit (INDEPENDENT_AMBULATORY_CARE_PROVIDER_SITE_OTHER): Payer: Medicaid Other | Admitting: Internal Medicine

## 2021-07-06 DIAGNOSIS — K625 Hemorrhage of anus and rectum: Secondary | ICD-10-CM | POA: Diagnosis not present

## 2021-07-06 DIAGNOSIS — R194 Change in bowel habit: Secondary | ICD-10-CM | POA: Diagnosis not present

## 2021-07-06 DIAGNOSIS — R109 Unspecified abdominal pain: Secondary | ICD-10-CM

## 2021-07-06 NOTE — Patient Instructions (Signed)
If you are age 56 or older, your body mass index should be between 23-30. Your Body mass index is 41.82 kg/m. If this is out of the aforementioned range listed, please consider follow up with your Primary Care Provider.  If you are age 40 or younger, your body mass index should be between 19-25. Your Body mass index is 41.82 kg/m. If this is out of the aformentioned range listed, please consider follow up with your Primary Care Provider.   __________________________________________________________  The [facility] providers would like to encourage you to use [facility] to communicate with providers for non-urgent requests or questions.  Due to long hold times on the telephone, sending your provider a message by [facility] may be a faster and more efficient way to get a response.  Please allow 48 business hours for a response.  Please remember that this is for non-urgent requests.   You have been scheduled for a CT scan of the abdomen and pelvis at [facility], 1st floor Radiology. You are scheduled on 07-16-21  at 12pm. You should arrive 15 minutes prior to your appointment time for registration.  Please pick up 2 bottles of contrast from [facility] at least 3 days prior to your scan. The solution may taste better if refrigerated, but do NOT add ice or any other liquid to this solution. Shake well before drinking.   Please follow the written instructions below on the day of your exam:   1) Do not eat anything after 8am (4 hours prior to your test)   2) Drink 1 bottle of contrast @ 10am (2 hours prior to your exam)  Remember to shake well before drinking and do NOT pour over ice.     Drink 1 bottle of contrast @ 11am (1 hour prior to your exam)   You may take any medications as prescribed with a small amount of water, if necessary. If you take any of the following medications: METFORMIN, GLUCOPHAGE, GLUCOVANCE, AVANDAMET, RIOMET, FORTAMET, [place] MET, JANUMET, GLUMETZA or METAGLIP, you MAY be  asked to HOLD this medication 48 hours AFTER the exam.   The purpose of you drinking the oral contrast is to aid in the visualization of your intestinal tract. The contrast solution may cause some diarrhea. Depending on your individual set of symptoms, you may also receive an intravenous injection of x-ray contrast/dye. Plan on being at [facility] for 45 minutes or longer, depending on the type of exam you are having performed.   If you have any questions regarding your exam or if you need to reschedule, you may call [facility] at [phone] between the hours of 8:00 am and 5:00 pm, Monday-Friday.   You have been scheduled for a colonoscopy. Please follow written instructions given to you at your visit today.  Please pick up your prep supplies at the pharmacy within the next 1-3 days. If you use inhalers (even only as needed), please bring them with you on the day of your procedure.  Due to recent changes in healthcare laws, you may see the results of your imaging and laboratory studies on MyChart before your provider has had a chance to review them.  We understand that in some cases there may be results that are confusing or concerning to you. Not all laboratory results come back in the same time frame and the provider may be waiting for multiple results in order to interpret others.  Please give [place] 48 hours in order for your provider  to thoroughly review all the results before contacting the office for clarification of your results.   Thank you for entrusting me with your care and choosing [facility].  Dr Lorenso Courier

## 2021-07-06 NOTE — Progress Notes (Signed)
Chief Complaint: Abdominal pain  HPI : 55 year old female with history of COPD, GERD, PTSD, asthma presents with abdominal pain  She has had ab pain since 06/09/21 when she presented to urgent care for LLQ pain and some blood in her stools. At that time she was encouraged to go to the ED, but she wanted to avoid going to the ED if possible. Since that time, the ab pain has continued. The pain is getting worse over time. Pain feels like a burning tingling sensation. It is located in middle of the back, left side of her back, left buttocks, left leg, and left groin area. The pain is constant. Putting a pillow between her legs when she lays down appears to sooth her pain somewhat. She has never had pain like this in the past, except when she has had back pain. She noted some blood in her stools a few weeks ago. The blood was seen on the toilet paper when she wiped. Endorses some issues with constipation alternating with diarrhea. Endorses N&V, which occurs on occasion, though this has not occurred over the last week. Denies weight loss. Denies prior colonoscopy. Positive for colon cancer in uncle, cousin, and aunt (unclear exactly what age). Endorses some issues with GERD for which she takes esomeprazole. The esomeprazole keeps her GERD under good control.   Past Medical History:  Diagnosis Date   Asthma    Bipolar affect, depressed ([place])    Bronchitis    Chronic back pain    COPD (chronic obstructive pulmonary disease) (HCC)    DJD (degenerative joint disease)    BACK   GERD (gastroesophageal reflux disease)    Hypertension    Pneumonia    Pre-diabetes    dr entered diabetes without complications- no meds does not test sugar at home   PTSD (post-traumatic stress disorder)    Scoliosis    Seizures ([place])    "when drinking" last yrs ago   Shortness of breath dyspnea    exersion   Thyroid disease    UTI (lower urinary tract infection)      Past Surgical History:  Procedure Laterality  Date   2 LEFT TOES SURGERY  12/2014   finished only on right side   BACK SURGERY     BALLOON DILATION N/A 12/14/2015   Procedure: BALLOON DILATION;  Surgeon: Teena Irani, MD;  Location: [facility];  Service: Endoscopy;  Laterality: N/A;   COLONOSCOPY WITH PROPOFOL N/A 12/14/2015   Procedure: COLONOSCOPY WITH PROPOFOL;  Surgeon: Teena Irani, MD;  Location[facility];  Service: Endoscopy;  Laterality: N/A;   ESOPHAGOGASTRODUODENOSCOPY (EGD) WITH PROPOFOL N/A 12/14/2015   Procedure: ESOPHAGOGASTRODUODENOSCOPY (EGD) WITH PROPOFOL;  Surgeon: Teena Irani, MD;  [facility];  Service: Endoscopy;  Laterality: N/A;   JOINT REPLACEMENT     left hip   LEFT ANKLE SURGERY  MARCH 2016   LUMBAR LAMINECTOMY/DECOMPRESSION MICRODISCECTOMY N/A 06/18/2017   Procedure: LAMINECTOMY AND FORAMINOTOMY LUMBAR FOUR - LUMBAR FIVE;  Surgeon: Ashok Pall, MD;  Location: [facility];  Service: Neurosurgery;  Laterality: N/A;  LAMINECTOMY AND FORAMINOTOMY LUMBAR 4- LUMBAR 5   LUMBAR WOUND DEBRIDEMENT N/A 03/10/2020   Procedure: Lumbar Wound Debridement;  Surgeon: Ashok Pall, MD;  Location: [facility];  Service: Neurosurgery;  Laterality: N/A;  posterior   NECK SURGERY  AS TEENAGER   STITCHES TO NECK    SURGERY FOR CUT ON STOMACH  TEENAGER   TOTAL HIP ARTHROPLASTY Left 05/14/2016   Procedure: TOTAL HIP ARTHROPLASTY ANTERIOR  APPROACH;  Surgeon: Renette Butters, MD;  Location: [facility];  Service: Orthopedics;  Laterality: Left;   Family History  Problem Relation Age of Onset   Hypertension Mother    Hypertension Maternal Grandmother    Hypertension Paternal Grandmother    Diabetes Paternal Grandmother    Hypertension Paternal Grandfather    Diabetes Paternal Grandfather    Alcohol abuse Paternal Aunt    Mental illness Cousin    Heart attack Cousin    Heart attack Maternal Uncle    Social History   Tobacco Use   Smoking status: Every Day    Packs/day: 1.00    Years: 26.00    Pack years: 26.00    Types:  Cigarettes   Smokeless tobacco: Never  Vaping Use   Vaping Use: Never used  Substance Use Topics   Alcohol use: No    Comment: quit date 01/17/2013   Drug use: No    Types: "Crack" cocaine, Heroin    Comment: quit date 04-17-2014 FOR CRACK   Current Outpatient Medications  Medication Sig Dispense Refill   albuterol (PROVENTIL) (2.5 MG/3ML) 0.083% nebulizer solution Take 2.5 mg by nebulization every 6 (six) hours as needed for wheezing or shortness of breath.     albuterol (VENTOLIN HFA) 108 (90 Base) MCG/ACT inhaler Inhale 2 puffs into the lungs every 6 (six) hours as needed for wheezing. 18 g 0   atomoxetine (STRATTERA) 40 MG capsule Take 40 mg by mouth daily.     Calcium Carb-Cholecalciferol (CALCIUM 600/VITAMIN D3 PO) Take 1 tablet by mouth daily.     cetirizine (ZYRTEC) 10 MG tablet Take 10 mg by mouth daily.     dicyclomine (BENTYL) 20 MG tablet Take 20 mg by mouth 4 (four) times daily.     esomeprazole (NEXIUM) 40 MG capsule Take 40 mg by mouth daily.     fluticasone (FLONASE) 50 MCG/ACT nasal spray Place 1 spray into both nostrils daily.     Fluticasone-Salmeterol (ADVAIR) 100-50 MCG/DOSE AEPB Inhale 1 puff into the lungs at bedtime.     furosemide (LASIX) 20 MG tablet Take 20-60 mg by mouth daily as needed for fluid or edema.     hydrOXYzine (VISTARIL) 25 MG capsule Take 25 mg by mouth 3 (three) times daily.     lithium carbonate 300 MG capsule Take 600 mg by mouth at bedtime.      losartan (COZAAR) 25 MG tablet Take 25 mg by mouth daily.     Multiple Vitamin (MULTIVITAMIN) tablet Take 1 tablet by mouth daily.     PARoxetine (PAXIL) 40 MG tablet Take 40 mg by mouth daily.     QUEtiapine (SEROQUEL) 200 MG tablet Take 200 mg by mouth at bedtime.      tiZANidine (ZANAFLEX) 4 MG tablet Take 4 mg by mouth every 8 (eight) hours as needed for muscle spasms.      vitamin B-12 (CYANOCOBALAMIN) 500 MCG tablet Take 500 mcg by mouth daily.     No current facility-administered medications  for this visit.   Allergies  Allergen Reactions   Risperidone And Related Swelling and Other (See Comments)    Facial swelling   Buspirone Swelling   Meloxicam Other (See Comments)    UNABLE TO REMEMBER   Prednisone Other (See Comments)    She reported history of mouth swelling with prednisone   Gabapentin Nausea And Vomiting and Swelling    Speech impairment  Patient denies allergy      Review of Systems: All  systems reviewed and negative except where noted in HPI.   Physical Exam: LMP 12/09/2015 Comment: very irregular Constitutional: Pleasant,well-developed, female in no acute distress. HEENT: Normocephalic and atraumatic. Conjunctivae are normal. No scleral icterus. Cardiovascular: Normal rate, regular rhythm.  Pulmonary/chest: Effort normal and breath sounds normal. No wheezing, rales or rhonchi. Abdominal: Soft, nondistended, tender to palpation throughout but worst in the left groin area.  Back: Tender to palpation in the middle of the back, the left side of her back, and left buttocks Neurological: Alert and oriented to person place and time. Skin: Skin is warm and dry. No rashes noted. Psychiatric: Normal mood and affect. Behavior is normal.  Labs 03/15/21: Chlamydia and Gonorrhea negative, CBC nml, TSH nml, CMP unremarkable   ASSESSMENT AND PLAN: Abdominal pain Rectal bleeding Alternating constipation and diarrhea Patient describes pain in a distribution that would fit more with lumbar radiculopathy, rather than diverticulitis. However because diverticulitis is still a possibility, we will start off with a CT A/P. If CT A/P does not show diverticulitis, then will plan for a colonoscopy as the patient is due for colon cancer screening.  - CT A/P w/contrast - Colonoscopy after CT A/P is completed  [name], MD

## 2021-07-07 ENCOUNTER — Emergency Department (HOSPITAL_COMMUNITY): Payer: Medicaid Other

## 2021-07-07 ENCOUNTER — Encounter (HOSPITAL_COMMUNITY): Payer: Self-pay | Admitting: Emergency Medicine

## 2021-07-07 ENCOUNTER — Other Ambulatory Visit: Payer: Self-pay

## 2021-07-07 ENCOUNTER — Emergency Department (HOSPITAL_COMMUNITY)
Admission: EM | Admit: 2021-07-07 | Discharge: 2021-07-07 | Disposition: A | Discharge status: Home / Self Care | Payer: Medicaid Other | Attending: Emergency Medicine | Admitting: Emergency Medicine

## 2021-07-07 DIAGNOSIS — W108XXA Fall (on) (from) other stairs and steps, initial encounter: Secondary | ICD-10-CM | POA: Diagnosis not present

## 2021-07-07 DIAGNOSIS — Z7951 Long term (current) use of inhaled steroids: Secondary | ICD-10-CM | POA: Insufficient documentation

## 2021-07-07 DIAGNOSIS — J45909 Unspecified asthma, uncomplicated: Secondary | ICD-10-CM | POA: Diagnosis not present

## 2021-07-07 DIAGNOSIS — Z79899 Other long term (current) drug therapy: Secondary | ICD-10-CM | POA: Insufficient documentation

## 2021-07-07 DIAGNOSIS — J449 Chronic obstructive pulmonary disease, unspecified: Secondary | ICD-10-CM | POA: Diagnosis not present

## 2021-07-07 DIAGNOSIS — M545 Low back pain, unspecified: Secondary | ICD-10-CM | POA: Insufficient documentation

## 2021-07-07 DIAGNOSIS — M25562 Pain in left knee: Secondary | ICD-10-CM | POA: Diagnosis not present

## 2021-07-07 DIAGNOSIS — S82892A Other fracture of left lower leg, initial encounter for closed fracture: Secondary | ICD-10-CM

## 2021-07-07 DIAGNOSIS — S8262XA Displaced fracture of lateral malleolus of left fibula, initial encounter for closed fracture: Secondary | ICD-10-CM | POA: Diagnosis not present

## 2021-07-07 DIAGNOSIS — Z96642 Presence of left artificial hip joint: Secondary | ICD-10-CM | POA: Insufficient documentation

## 2021-07-07 DIAGNOSIS — F17210 Nicotine dependence, cigarettes, uncomplicated: Secondary | ICD-10-CM | POA: Diagnosis not present

## 2021-07-07 DIAGNOSIS — Y9301 Activity, walking, marching and hiking: Secondary | ICD-10-CM | POA: Diagnosis not present

## 2021-07-07 DIAGNOSIS — I10 Essential (primary) hypertension: Secondary | ICD-10-CM | POA: Insufficient documentation

## 2021-07-07 DIAGNOSIS — S99912A Unspecified injury of left ankle, initial encounter: Secondary | ICD-10-CM | POA: Diagnosis present

## 2021-07-07 DIAGNOSIS — R519 Headache, unspecified: Secondary | ICD-10-CM | POA: Insufficient documentation

## 2021-07-07 MED ORDER — HYDROCODONE-ACETAMINOPHEN 5-325 MG PO TABS
1.0000 | ORAL_TABLET | Freq: Once | ORAL | Status: AC
  Administered 2021-07-07: 1 via ORAL
  Filled 2021-07-07: qty 1

## 2021-07-07 MED ORDER — HYDROCODONE-ACETAMINOPHEN 5-325 MG PO TABS: 1.0000 | ORAL_TABLET | ORAL | 0 refills | Status: DC | PRN

## 2021-07-07 MED ORDER — LIDOCAINE 5 % EX PTCH
1.0000 | MEDICATED_PATCH | CUTANEOUS | Status: DC
  Administered 2021-07-07: 1 via TRANSDERMAL
  Filled 2021-07-07: qty 1

## 2021-07-07 MED ORDER — METHOCARBAMOL 500 MG PO TABS
500.0000 mg | ORAL_TABLET | Freq: Once | ORAL | Status: AC
  Administered 2021-07-07: 500 mg via ORAL
  Filled 2021-07-07: qty 1

## 2021-07-07 NOTE — ED Provider Notes (Signed)
[facility] DEPT Provider Note   CSN: [id number] Arrival date & time: 07/07/21  1952     History Chief Complaint  Patient presents with   Alicia Blake    Alicia Blake is a 55 y.o. female with a past medical history as noted below who presents to the ED after a mechanical fall.  Patient states she slipped on water while walking down the stairs.  She admits to hitting her head.  No loss of consciousness.  She is not currently on any blood thinners.  Patient admits to low back, left knee, and left ankle pain.  She notes after the fall she was unable to ambulate due to pain.  No chest pain or shortness of breath.  Denies any numbness/tingling or weakness.  Denies visual changes, speech changes, and unilateral weakness.  She has not tried anything for symptoms.  History obtained from patient and past medical records. No interpreter used during encounter.       Past Medical History:  Diagnosis Date   Asthma    Bipolar affect, depressed ([place])    Bronchitis    Chronic back pain    COPD (chronic obstructive pulmonary disease) (HCC)    DJD (degenerative joint disease)    BACK   GERD (gastroesophageal reflux disease)    Hypertension    Pneumonia    Pre-diabetes    dr entered diabetes without complications- no meds does not test sugar at home   PTSD (post-traumatic stress disorder)    Scoliosis    Seizures ([place])    "when drinking" last yrs ago   Shortness of breath dyspnea    exersion   Thyroid disease    UTI (lower urinary tract infection)     Patient Active Problem List   Diagnosis Date Noted   Lumbar foraminal stenosis 04/21/2020   Lumbar stenosis with neurogenic claudication 06/18/2017   Tobacco abuse 04/19/2016   Primary osteoarthritis of left hip 04/18/2016   Opioid use disorder, mild, abuse ([place]) 09/08/2015   Schizoaffective disorder, bipolar type ([place]) 09/07/2015   Chronic pain syndrome 09/07/2015   Alcohol use disorder, moderate, in sustained  remission ([place]) 09/07/2015   Cocaine use disorder, moderate, in sustained remission ([place]) 09/07/2015   Cannabis use disorder, moderate, in sustained remission ([place]) 09/07/2015   DDD (degenerative disc disease), lumbar 09/07/2015   Hx of fracture of ankle 09/07/2015   Symptomatic Fibroids 08/09/2013   Breast lump on left side at 9 o'clock position 06/29/2013   Breast discharge 06/29/2013    Past Surgical History:  Procedure Laterality Date   2 LEFT TOES SURGERY  12/2014   finished only on right side   BACK SURGERY     BALLOON DILATION N/A 12/14/2015   Procedure: BALLOON DILATION;  Surgeon: Alicia Irani, MD;  [facility];  Service: Endoscopy;  Laterality: N/A;   COLONOSCOPY WITH PROPOFOL N/A 12/14/2015   Procedure: COLONOSCOPY WITH PROPOFOL;  Surgeon: Alicia Irani, MD;  [facility];  Service: Endoscopy;  Laterality: N/A;   ESOPHAGOGASTRODUODENOSCOPY (EGD) WITH PROPOFOL N/A 12/14/2015   Procedure: ESOPHAGOGASTRODUODENOSCOPY (EGD) WITH PROPOFOL;  Surgeon: Alicia Irani, MD;  [facility];  Service: Endoscopy;  Laterality: N/A;   JOINT REPLACEMENT     left hip   LEFT ANKLE SURGERY  MARCH 2016   LUMBAR LAMINECTOMY/DECOMPRESSION MICRODISCECTOMY N/A 06/18/2017   Procedure: LAMINECTOMY AND FORAMINOTOMY LUMBAR FOUR - LUMBAR FIVE;  Surgeon: Alicia Pall, MD;  Location: [facility];  Service: Neurosurgery;  Laterality: N/A;  LAMINECTOMY AND FORAMINOTOMY  LUMBAR 4- LUMBAR 5   LUMBAR WOUND DEBRIDEMENT N/A 03/10/2020   Procedure: Lumbar Wound Debridement;  Surgeon: Alicia Pall, MD;  Location: [facility];  Service: Neurosurgery;  Laterality: N/A;  posterior   NECK SURGERY  AS TEENAGER   STITCHES TO NECK    SURGERY FOR CUT ON STOMACH  TEENAGER   TOTAL HIP ARTHROPLASTY Left 05/14/2016   Procedure: TOTAL HIP ARTHROPLASTY ANTERIOR APPROACH;  Surgeon: Alicia Butters, MD;  Location: [facility];  Service: Orthopedics;  Laterality: Left;     OB History     Gravida  1   Para  1   Term  1    Preterm      AB      Living  1      SAB      IAB      Ectopic      Multiple      Live Births              Family History  Problem Relation Age of Onset   Hypertension Mother    Hypertension Maternal Grandmother    Hypertension Paternal Grandmother    Diabetes Paternal Grandmother    Hypertension Paternal Grandfather    Diabetes Paternal Grandfather    Alcohol abuse Paternal Aunt    Mental illness Cousin    Heart attack Cousin    Heart attack Maternal Uncle     Social History   Tobacco Use   Smoking status: Every Day    Packs/day: 1.00    Years: 26.00    Pack years: 26.00    Types: Cigarettes   Smokeless tobacco: Never  Vaping Use   Vaping Use: Never used  Substance Use Topics   Alcohol use: No    Comment: quit date 01/17/2013   Drug use: No    Types: "Crack" cocaine, Heroin    Comment: quit date 04-17-2014 FOR CRACK    Home Medications Prior to Admission medications   Medication Sig Start Date End Date Taking? Authorizing Provider  ABILIFY MAINTENA 400 MG PRSY prefilled syringe SMARTSIG:1 Injection IM Once a Month 06/13/21  Yes [provider]  albuterol (PROVENTIL) (2.5 MG/3ML) 0.083% nebulizer solution Take 2.5 mg by nebulization every 6 (six) hours as needed for wheezing or shortness of breath.   Yes [provider]  albuterol (VENTOLIN HFA) 108 (90 Base) MCG/ACT inhaler Inhale 2 puffs into the lungs every 6 (six) hours as needed for wheezing. 10/11/19  Yes Lamptey, Alicia Galas, MD  atomoxetine (STRATTERA) 40 MG capsule Take 40 mg by mouth daily. 09/18/19  Yes Alicia Ewings, FNP  Calcium Carb-Cholecalciferol (CALCIUM 600/VITAMIN D3 PO) Take 1 tablet by mouth daily.   Yes [provider]  cetirizine (ZYRTEC) 10 MG tablet Take 10 mg by mouth daily. 11/02/19  Yes [provider]  dicyclomine (BENTYL) 20 MG tablet Take 20 mg by mouth 4 (four) times daily. 02/25/20  Yes [provider]  esomeprazole (NEXIUM) 40 MG  capsule Take 40 mg by mouth daily. 09/22/19  Yes [provider]  Fluticasone-Salmeterol (ADVAIR) 100-50 MCG/DOSE AEPB Inhale 1 puff into the lungs at bedtime.   Yes [provider]  furosemide (LASIX) 20 MG tablet Take 20-60 mg by mouth daily as needed for fluid or edema.   Yes [provider]  HYDROcodone-acetaminophen (NORCO/VICODIN) 5-325 MG tablet Take 1 tablet by mouth every 4 (four) hours as needed. 07/07/21  Yes Jaimin Krupka C, PA-C  hydrOXYzine (VISTARIL) 25 MG capsule Take 25  mg by mouth 3 (three) times daily. 06/16/19  Yes [provider]  ibuprofen (ADVIL) 800 MG tablet Take 800 mg by mouth 2 (two) times daily as needed. 03/15/21  Yes [provider]  lithium carbonate 300 MG capsule Take 600 mg by mouth at bedtime.  09/18/19  Yes [provider]  losartan (COZAAR) 25 MG tablet Take 25 mg by mouth daily. 09/18/19  Yes Trey Sailors, PA  Multiple Vitamin (MULTIVITAMIN) tablet Take 1 tablet by mouth daily.   Yes [provider]  PARoxetine (PAXIL) 40 MG tablet Take 40 mg by mouth daily.   Yes [provider]  QUEtiapine (SEROQUEL) 200 MG tablet Take 200 mg by mouth at bedtime.    Yes [provider]  tiZANidine (ZANAFLEX) 4 MG tablet Take 4 mg by mouth every 8 (eight) hours as needed for muscle spasms.    Yes [provider]  vitamin B-12 (CYANOCOBALAMIN) 500 MCG tablet Take 500 mcg by mouth daily.   Yes [provider]  fluticasone (FLONASE) 50 MCG/ACT nasal spray Place 1 spray into both nostrils daily.    [provider]    Allergies    Risperidone and related, Buspirone, Meloxicam, Prednisone, and Gabapentin  Review of Systems   Review of Systems  Respiratory:  Negative for shortness of breath.   Cardiovascular:  Negative for chest pain.  Gastrointestinal:  Negative for abdominal pain.  Musculoskeletal:  Positive for arthralgias, back pain, gait problem and joint  swelling.  Neurological:  Positive for headaches. Negative for weakness and numbness.  All other systems reviewed and are negative.  Physical Exam Updated Vital Signs BP (!) 163/106 (BP Location: Left Arm)   Pulse 81   Temp 98.6 F (37 C)   Resp 18   Ht '5\' 6"'$  (1.676 m)   Wt 115.2 kg   LMP 12/09/2015 Comment: very irregular  SpO2 93%   BMI 41.00 kg/m   Physical Exam Vitals and nursing note reviewed.  Constitutional:      General: She is not in acute distress.    Appearance: She is not ill-appearing.  HENT:     Head: Normocephalic.  Eyes:     Pupils: Pupils are equal, round, and reactive to light.  Cardiovascular:     Rate and Rhythm: Normal rate and regular rhythm.     Pulses: Normal pulses.     Heart sounds: Normal heart sounds. No murmur heard.   No friction rub. No gallop.  Pulmonary:     Effort: Pulmonary effort is normal.     Breath sounds: Normal breath sounds.  Abdominal:     General: Abdomen is flat. There is no distension.     Palpations: Abdomen is soft.     Tenderness: There is no abdominal tenderness. There is no guarding or rebound.  Musculoskeletal:        General: Normal range of motion.     Cervical back: Neck supple.     Comments: No thoracic or lumbar midline tenderness.  Bilateral lumbar paraspinal tenderness.  Tenderness over lateral malleolus of left ankle.  Mild edema.  Pedal pulses palpable.  Soft compartments.  Skin:    General: Skin is warm and dry.  Neurological:     General: No focal deficit present.     Mental Status: She is alert.     Comments: Speech is clear, able to follow commands CN III-XII intact Normal strength in upper and lower extremities bilaterally including dorsiflexion and plantar flexion, strong and  equal grip strength Sensation grossly intact throughout Moves extremities without ataxia, coordination intact No pronator drift  Psychiatric:        Mood and Affect: Mood normal.        Behavior: Behavior normal.    ED  Results / Procedures / Treatments   Labs (all labs ordered are listed, but only abnormal results are displayed) Labs Reviewed - No data to display  EKG None  Radiology DG Lumbar Spine Complete  Result Date: 07/07/2021 CLINICAL DATA:  Low back pain radiating down the left leg following a fall today. EXAM: LUMBAR SPINE - COMPLETE 4+ VIEW COMPARISON:  05/03/2021 FINDINGS: Five non-rib-bearing lumbar vertebrae. Pedicle screw and rod fixation at the L5-S1 level is again demonstrated without the previously seen anterolisthesis at that level. Degenerative changes are again demonstrated at the L4-5 and L5-S1 level with vacuum phenomena and spur formation. No fractures or pars defects. A hip prosthesis is again noted. IMPRESSION: 1. Hardware fixation at the L5-S1 level without the previously seen anterolisthesis. 2. No fracture. Electronically Signed   By: Claudie Revering M.D.   On: 07/07/2021 21:00   DG Ankle Complete Left  Result Date: 07/07/2021 CLINICAL DATA:  Status post fall.  Pain. EXAM: LEFT ANKLE COMPLETE - 3+ VIEW COMPARISON:  None. FINDINGS: Mildly displaced comminuted fracture of the left lateral malleolus. Well corticated fragment off of the left medial malleolus is likely due to prior injury. Chronic appearing deformity of the distal fibula. Ankle mortise appears symmetric. There is soft tissue swelling about the lateral malleolus. IMPRESSION: 1. Mildly displaced comminuted fracture of the left lateral malleolus. 2. Well corticated chronic probable old fracture deformity of the medial malleolus. Electronically Signed   By: Fidela Salisbury M.D.   On: 07/07/2021 20:44   CT Head Wo Contrast  Result Date: 07/07/2021 CLINICAL DATA:  Posttraumatic headache. EXAM: CT HEAD WITHOUT CONTRAST TECHNIQUE: Contiguous axial images were obtained from the base of the skull through the vertex without intravenous contrast. COMPARISON:  July 17, 2019 FINDINGS: Brain: No evidence of acute infarction,  hemorrhage, hydrocephalus, extra-axial collection or mass lesion/mass effect. Vascular: No hyperdense vessel or unexpected calcification. Skull: Normal. Negative for fracture or focal lesion. Sinuses/Orbits: No acute finding. Other: None. IMPRESSION: No acute intracranial abnormality. Electronically Signed   By: Fidela Salisbury M.D.   On: 07/07/2021 20:47   CT Cervical Spine Wo Contrast  Result Date: 07/07/2021 CLINICAL DATA:  Neck trauma with midline tenderness. EXAM: CT CERVICAL SPINE WITHOUT CONTRAST TECHNIQUE: Multidetector CT imaging of the cervical spine was performed without intravenous contrast. Multiplanar CT image reconstructions were also generated. COMPARISON:  None. FINDINGS: Alignment: Normal. Skull base and vertebrae: No acute fracture. No primary bone lesion or focal pathologic process. Soft tissues and spinal canal: No prevertebral fluid or swelling. No visible canal hematoma. Disc levels:  Mild osteoarthritic changes of the midcervical spine. Upper chest: Negative. Other: None. IMPRESSION: No evidence of acute traumatic injury to the cervical spine. Electronically Signed   By: Fidela Salisbury M.D.   On: 07/07/2021 20:50   DG Knee Complete 4 Views Left  Result Date: 07/07/2021 CLINICAL DATA:  [address] down stairs, left knee pain EXAM: LEFT KNEE - COMPLETE 4+ VIEW COMPARISON:  None. FINDINGS: Frontal, bilateral oblique, lateral views of the left knee are obtained. No fracture, subluxation, or dislocation. There is mild to moderate medial compartmental joint space narrowing and osteophyte formation. No joint effusion. Soft tissues are unremarkable. IMPRESSION: 1. Mild to moderate medial compartmental osteoarthritis. 2. No acute  displaced fracture. Electronically Signed   By: Randa Ngo M.D.   On: 07/07/2021 20:37    Procedures Procedures   Medications Ordered in ED Medications  lidocaine (LIDODERM) 5 % 1 patch (1 patch Transdermal Patch Applied 07/07/21 2132)   HYDROcodone-acetaminophen (NORCO/VICODIN) 5-325 MG per tablet 1 tablet (1 tablet Oral Given 07/07/21 2130)  methocarbamol (ROBAXIN) tablet 500 mg (500 mg Oral Given 07/07/21 2130)    ED Course  I have reviewed the triage vital signs and the nursing notes.  Pertinent labs & imaging results that were available during my care of the patient were reviewed by me and considered in my medical decision making (see chart for details).    MDM Rules/Calculators/A&P                           55 year old female presents to the ED after mechanical fall down wet stairs complaining of low back, left knee, and left ankle pain.  Positive head injury.  No loss of consciousness.  She not currently on blood thinners.  Upon arrival, stable vitals.  Patient in no acute distress.  No thoracic or lumbar midline tenderness.  Bilateral lumbar paraspinal tenderness.  Bilateral lower extremities neurovascularly intact.  Low suspicion for cauda equina or central cord compression.  No tenderness to bilateral hips. Low suspicion for fracture. Tenderness over left malleolus.  Pedal pulses palpable.  Soft compartments.  Low suspicion for compartment syndrome.  Normal neurological exam. X-rays and CT scans ordered at triage.  Norco, Robaxin, Lidoderm patches for symptomatic relief.  CT head and cervical spine personally reviewed which is negative for any acute abnormalities.  Lumbar and left knee x-ray negative for any acute abnormalities.  Left ankle x-ray demonstrates left malleolus fracture.  Patient placed in splint.  Crutches given.  Advised patient to remain nonweightbearing until seen by orthopedics.  Discussed case with Dr. Darl Householder who reviewed x-rays and agrees with assessment and plan.  Patient discharged with short course of pain medications. Strict ED precautions discussed with patient. Patient states understanding and agrees to plan. Patient discharged home in no acute distress and stable vitals. Final Clinical Impression(s)  / ED Diagnoses Final diagnoses:  Closed fracture of malleolus of left ankle, initial encounter    Rx / DC Orders ED Discharge Orders          Ordered    HYDROcodone-acetaminophen (NORCO/VICODIN) 5-325 MG tablet  Every 4 hours PRN        07/07/21 2134             Karie Kirks 07/07/21 2137    Drenda Freeze, MD 07/07/21 2302

## 2021-07-07 NOTE — Progress Notes (Signed)
Orthopedic Tech Progress Note Patient Details:  Alicia Blake 05-14-1966 [id number]  Ortho Devices Type of Ortho Device: Post (short leg) splint Ortho Device/Splint Location: lle Ortho Device/Splint Interventions: Ordered, Application, Adjustment   Post Interventions Patient Tolerated: Well Instructions Provided: Care of device, Adjustment of device  Karolee Stamps 07/07/2021, 11:12 PM

## 2021-07-07 NOTE — Discharge Instructions (Addendum)
It was a pleasure taking care of you today.  As discussed, your ankle x-ray showed a fracture of your left malleolus.  Continue to be nonweightbearing until seen by the orthopedic surgeon.  I have included the number of the orthopedic surgeon.  Please call to schedule an appointment for further evaluation.  I am sending home with a short course of pain medication.  Take as needed for severe pain.  Return to the ER for any worsening symptoms.

## 2021-07-07 NOTE — ED Provider Notes (Signed)
Emergency Medicine Provider Triage Evaluation Note  Alicia Blake , a 55 y.o. female  was evaluated in triage.  Pt complains of fall.  She fell shortly prior to arrival off the step.  She states she hit her head.  She thinks she passed out.  She reports that she has pain in her low back.  She reports neck pain. No blood thinners.  She is concerned she may have broken her left ankle.   Review of Systems  Positive: Fall, headache, back pain, ankle pain (left)  Negative: wound  Physical Exam  BP (!) 163/106 (BP Location: Left Arm)   Pulse 81   Temp 98.6 F (37 C)   Resp 18   Ht '5\' 6"'$  (1.676 m)   Wt 115.2 kg   LMP 12/09/2015 Comment: very irregular  SpO2 93%   BMI 41.00 kg/m  Gen:   Awake, no distress   Resp:  Normal effort  MSK:   Moves extremities without difficulty obvious edema around left ankle.  There is TTP in the left ankle and the left proximal lower leg.   She has mild neck pain.  Other:  No laceration over the left ankle  Medical Decision Making  Medically screening exam initiated at 8:03 PM.  Appropriate orders placed.  Alicia Blake was informed that the remainder of the evaluation will be completed by another provider, this initial triage assessment does not replace that evaluation, and the importance of remaining in the ED until their evaluation is complete.     Ollen Gross 07/07/21 2017    Teressa Lower, MD 07/07/21 2252

## 2021-07-07 NOTE — ED Triage Notes (Signed)
Patient states she was going down the stairs, and when she stepped off the bottom stair, she slipped due to her shoes and fell on her ankle. Patient states she also hit her head and is having left knee pain.

## 2021-07-07 NOTE — ED Notes (Signed)
Ortho tech paged  

## 2021-07-13 ENCOUNTER — Encounter: Payer: Medicaid Other | Admitting: Gastroenterology

## 2021-07-16 ENCOUNTER — Ambulatory Visit (HOSPITAL_COMMUNITY)
Admission: RE | Admit: 2021-07-16 | Discharge: 2021-07-16 | Disposition: A | Discharge status: Home / Self Care | Payer: Medicaid Other | Source: Ambulatory Visit | Attending: Internal Medicine | Admitting: Internal Medicine

## 2021-07-16 ENCOUNTER — Other Ambulatory Visit: Payer: Self-pay

## 2021-07-16 ENCOUNTER — Encounter (HOSPITAL_COMMUNITY): Payer: Self-pay

## 2021-07-16 DIAGNOSIS — R109 Unspecified abdominal pain: Secondary | ICD-10-CM | POA: Diagnosis not present

## 2021-07-16 DIAGNOSIS — R194 Change in bowel habit: Secondary | ICD-10-CM | POA: Insufficient documentation

## 2021-07-16 DIAGNOSIS — K625 Hemorrhage of anus and rectum: Secondary | ICD-10-CM | POA: Insufficient documentation

## 2021-07-16 LAB — POCT I-STAT CREATININE: Creatinine, Ser: 0.8 mg/dL (ref 0.44–1.00)

## 2021-07-16 MED ORDER — IOHEXOL 350 MG/ML SOLN
75.0000 mL | Freq: Once | INTRAVENOUS | Status: AC | PRN
  Administered 2021-07-16: 75 mL via INTRAVENOUS

## 2021-07-16 NOTE — Progress Notes (Signed)
Hi Ammie, let's schedule a colonoscopy for her whenever she is willing to get it (she had a fall recently so may not be feeling up to getting it immediately).

## 2021-07-17 NOTE — Progress Notes (Signed)
Pt was previously scheduled for colonoscopy as below:  Appointment Information  Name: Alicia Blake, Alicia Blake MRN: [id number]  Date: 07/27/2021 Status: Sch  Time: 1:30 PM Length: 30  Visit Type: PROPOFOL COLON [1272] Copay: $0.00  Provider: Sharyn Creamer, MD Department: [facility]  Referring Provider: Nolene Ebbs CSN: [id number]  Notes: abd pain, rectal bleeding, alt bowel habits, nhr   Made On: 07/06/2021 4:27 PM By: Stevan Born   Called pt to inquire if she still felt up to proceeding with this scheduled colonoscopy since her fall. Pt confirmed she will proceed as scheduled. Also confirmed she has her prep instructions and will purchase items needed to complete her prep.

## 2021-07-18 ENCOUNTER — Other Ambulatory Visit: Payer: Self-pay

## 2021-07-18 ENCOUNTER — Encounter (HOSPITAL_BASED_OUTPATIENT_CLINIC_OR_DEPARTMENT_OTHER): Payer: Self-pay | Admitting: Orthopedic Surgery

## 2021-07-18 NOTE — H&P (Addendum)
PREOPERATIVE H&P  Chief Complaint: LEFT ANKLE FRACTURE  HPI: Alicia Blake is a 55 y.o. female who presents with a diagnosis of LEFT ANKLE FRACTURE. She fell going down the stairs at home. Symptoms are rated as moderate to severe, and have been worsening. This is significantly impairing activities of daily living.  She has elected for surgical management.   Past Medical History:  Diagnosis Date   Asthma    Bipolar affect, depressed ([place])    Bronchitis    Chronic back pain    COPD (chronic obstructive pulmonary disease) (HCC)    DJD (degenerative joint disease)    BACK   GERD (gastroesophageal reflux disease)    Hypertension    Pneumonia    Pre-diabetes    dr entered diabetes without complications- no meds does not test sugar at home   PTSD (post-traumatic stress disorder)    Scoliosis    Seizures ([place])    "when drinking" last yrs ago   Shortness of breath dyspnea    exersion   Thyroid disease    UTI (lower urinary tract infection)    Past Surgical History:  Procedure Laterality Date   2 LEFT TOES SURGERY  12/2014   finished only on right side   BACK SURGERY     BALLOON DILATION N/A 12/14/2015   Procedure: BALLOON DILATION;  Surgeon: Teena Irani, MD;  Location: [facility];  Service: Endoscopy;  Laterality: N/A;   COLONOSCOPY WITH PROPOFOL N/A 12/14/2015   Procedure: COLONOSCOPY WITH PROPOFOL;  Surgeon: Teena Irani, MD;  Location: [facility];  Service: Endoscopy;  Laterality: N/A;   ESOPHAGOGASTRODUODENOSCOPY (EGD) WITH PROPOFOL N/A 12/14/2015   Procedure: ESOPHAGOGASTRODUODENOSCOPY (EGD) WITH PROPOFOL;  Surgeon: Teena Irani, MD;  Location: [facility];  Service: Endoscopy;  Laterality: N/A;   JOINT REPLACEMENT     left hip   LEFT ANKLE SURGERY  MARCH 2016   LUMBAR LAMINECTOMY/DECOMPRESSION MICRODISCECTOMY N/A 06/18/2017   Procedure: LAMINECTOMY AND FORAMINOTOMY LUMBAR FOUR - LUMBAR FIVE;  Surgeon: Ashok Pall, MD;  Location: [facility];  Service: Neurosurgery;  Laterality:  N/A;  LAMINECTOMY AND FORAMINOTOMY LUMBAR 4- LUMBAR 5   LUMBAR WOUND DEBRIDEMENT N/A 03/10/2020   Procedure: Lumbar Wound Debridement;  Surgeon: Ashok Pall, MD;  Location: [place];  Service: Neurosurgery;  Laterality: N/A;  posterior   NECK SURGERY  AS TEENAGER   STITCHES TO NECK    SURGERY FOR CUT ON STOMACH  TEENAGER   TOTAL HIP ARTHROPLASTY Left 05/14/2016   Procedure: TOTAL HIP ARTHROPLASTY ANTERIOR APPROACH;  Surgeon: Renette Butters, MD;  Location: [facility];  Service: Orthopedics;  Laterality: Left;   Social History   Socioeconomic History   Marital status: Single    Spouse name: Not on file   Number of children: Not on file   Years of education: Not on file   Highest education level: Not on file  Occupational History   Not on file  Tobacco Use   Smoking status: Every Day    Packs/day: 1.00    Years: 26.00    Pack years: 26.00    Types: Cigarettes   Smokeless tobacco: Never  Vaping Use   Vaping Use: Never used  Substance and Sexual Activity   Alcohol use: No    Comment: quit date 01/17/2013   Drug use: No    Types: "Crack" cocaine, Heroin    Comment: quit date 04-17-2014 FOR CRACK   Sexual activity: Yes    Birth control/protection: None  Other Topics Concern   Not on  file  Social History Narrative   Not on file   Social Determinants of Health   Financial Resource Strain: Not on file  Food Insecurity: Not on file  Transportation Needs: Not on file  Physical Activity: Not on file  Stress: Not on file  Social Connections: Not on file   Family History  Problem Relation Age of Onset   Hypertension Mother    Hypertension Maternal Grandmother    Hypertension Paternal Grandmother    Diabetes Paternal Grandmother    Hypertension Paternal Grandfather    Diabetes Paternal Grandfather    Alcohol abuse Paternal Aunt    Mental illness Cousin    Heart attack Cousin    Heart attack Maternal Uncle    Allergies  Allergen Reactions   Risperidone And Related Swelling  and Other (See Comments)    Facial swelling   Buspirone Swelling   Meloxicam Other (See Comments)    UNABLE TO REMEMBER   Prednisone Other (See Comments)    She reported history of mouth swelling with prednisone   Gabapentin Nausea And Vomiting and Swelling    Speech impairment  Patient denies allergy    Prior to Admission medications   Medication Sig Start Date End Date Taking? Authorizing Provider  ABILIFY MAINTENA 400 MG PRSY prefilled syringe SMARTSIG:1 Injection IM Once a Month 06/13/21   [provider]  albuterol (PROVENTIL) (2.5 MG/3ML) 0.083% nebulizer solution Take 2.5 mg by nebulization every 6 (six) hours as needed for wheezing or shortness of breath.    [provider]  albuterol (VENTOLIN HFA) 108 (90 Base) MCG/ACT inhaler Inhale 2 puffs into the lungs every 6 (six) hours as needed for wheezing. 10/11/19   Chase Picket, MD  atomoxetine (STRATTERA) 40 MG capsule Take 40 mg by mouth daily. 09/18/19   Elbert Ewings, FNP  Calcium Carb-Cholecalciferol (CALCIUM 600/VITAMIN D3 PO) Take 1 tablet by mouth daily.    [provider]  cetirizine (ZYRTEC) 10 MG tablet Take 10 mg by mouth daily. 11/02/19   [provider]  dicyclomine (BENTYL) 20 MG tablet Take 20 mg by mouth 4 (four) times daily. 02/25/20   [provider]  esomeprazole (NEXIUM) 40 MG capsule Take 40 mg by mouth daily. 09/22/19   [provider]  fluticasone (FLONASE) 50 MCG/ACT nasal spray Place 1 spray into both nostrils daily.    [provider]  Fluticasone-Salmeterol (ADVAIR) 100-50 MCG/DOSE AEPB Inhale 1 puff into the lungs at bedtime.    [provider]  furosemide (LASIX) 20 MG tablet Take 20-60 mg by mouth daily as needed for fluid or edema.    [provider]  HYDROcodone-acetaminophen (NORCO/VICODIN) 5-325 MG tablet Take 1 tablet by mouth every 4 (four) hours as needed. 07/07/21   Suzy Bouchard, PA-C  hydrOXYzine (VISTARIL)  25 MG capsule Take 25 mg by mouth 3 (three) times daily. 06/16/19   [provider]  ibuprofen (ADVIL) 800 MG tablet Take 800 mg by mouth 2 (two) times daily as needed. 03/15/21   [provider]  lithium carbonate 300 MG capsule Take 600 mg by mouth at bedtime.  09/18/19   [provider]  losartan (COZAAR) 25 MG tablet Take 25 mg by mouth daily. 09/18/19   Trey Sailors, PA  Multiple Vitamin (MULTIVITAMIN) tablet Take 1 tablet by mouth daily.    [provider]  PARoxetine (PAXIL) 40 MG tablet Take 40 mg by mouth daily.    [provider]  QUEtiapine (SEROQUEL) 200  MG tablet Take 200 mg by mouth at bedtime.     [provider]  tiZANidine (ZANAFLEX) 4 MG tablet Take 4 mg by mouth every 8 (eight) hours as needed for muscle spasms.     [provider]  vitamin B-12 (CYANOCOBALAMIN) 500 MCG tablet Take 500 mcg by mouth daily.    [provider]     Positive ROS: All other systems have been reviewed and were otherwise negative with the exception of those mentioned in the HPI and as above.  Physical Exam: General: Alert, no acute distress Cardiovascular: No pedal edema Respiratory: No cyanosis, no use of accessory musculature GI: No organomegaly, abdomen is soft and non-tender Skin: No lesions in the area of chief complaint Neurologic: Sensation intact distally Psychiatric: Patient is competent for consent with normal mood and affect Lymphatic: No axillary or cervical lymphadenopathy  MUSCULOSKELETAL: TTP left ankle lateral malleolus, edema and ecchymosis same area, painful ROM, NVI   Imaging: Mildly displaced comminuted fracture of the left lateral malleolus. Well corticated chronic probable old fracture deformity of the medial malleolus   Assessment: LEFT ANKLE FRACTURE  Plan: Plan for Procedure(s): OPEN REDUCTION INTERNAL FIXATION (ORIF) ANKLE FRACTURE  The risks benefits and alternatives were discussed  with the patient including but not limited to the risks of nonoperative treatment, versus surgical intervention including infection, bleeding, nerve injury,  blood clots, cardiopulmonary complications, morbidity, mortality, among others, and they were willing to proceed.   Weightbearing: NWB LLE Orthopedic devices: splint Showering: POD 3, keep splint dry Dressing: splint Medicines: ASA, Norco 10, Diclofenac, Robaxin, Zofran  Discharge: home Follow up: 2 weeks    Alisa Graff Office [phone] 07/18/2021 12:33 PM

## 2021-07-23 ENCOUNTER — Encounter (HOSPITAL_BASED_OUTPATIENT_CLINIC_OR_DEPARTMENT_OTHER)
Admission: RE | Admit: 2021-07-23 | Discharge: 2021-07-23 | Disposition: A | Discharge status: Home / Self Care | Payer: Medicaid Other | Source: Ambulatory Visit | Attending: Orthopedic Surgery | Admitting: Orthopedic Surgery

## 2021-07-23 ENCOUNTER — Other Ambulatory Visit (HOSPITAL_BASED_OUTPATIENT_CLINIC_OR_DEPARTMENT_OTHER): Payer: Medicaid Other

## 2021-07-23 DIAGNOSIS — Z8249 Family history of ischemic heart disease and other diseases of the circulatory system: Secondary | ICD-10-CM | POA: Diagnosis not present

## 2021-07-23 DIAGNOSIS — Z888 Allergy status to other drugs, medicaments and biological substances status: Secondary | ICD-10-CM | POA: Diagnosis not present

## 2021-07-23 DIAGNOSIS — Z79899 Other long term (current) drug therapy: Secondary | ICD-10-CM | POA: Diagnosis not present

## 2021-07-23 DIAGNOSIS — I10 Essential (primary) hypertension: Secondary | ICD-10-CM | POA: Diagnosis not present

## 2021-07-23 DIAGNOSIS — E079 Disorder of thyroid, unspecified: Secondary | ICD-10-CM | POA: Diagnosis not present

## 2021-07-23 DIAGNOSIS — W109XXA Fall (on) (from) unspecified stairs and steps, initial encounter: Secondary | ICD-10-CM | POA: Diagnosis not present

## 2021-07-23 DIAGNOSIS — Y92009 Unspecified place in unspecified non-institutional (private) residence as the place of occurrence of the external cause: Secondary | ICD-10-CM | POA: Diagnosis not present

## 2021-07-23 DIAGNOSIS — Z96642 Presence of left artificial hip joint: Secondary | ICD-10-CM | POA: Diagnosis not present

## 2021-07-23 DIAGNOSIS — Y939 Activity, unspecified: Secondary | ICD-10-CM | POA: Diagnosis not present

## 2021-07-23 DIAGNOSIS — Z7951 Long term (current) use of inhaled steroids: Secondary | ICD-10-CM | POA: Diagnosis not present

## 2021-07-23 DIAGNOSIS — S8262XA Displaced fracture of lateral malleolus of left fibula, initial encounter for closed fracture: Secondary | ICD-10-CM | POA: Diagnosis not present

## 2021-07-23 DIAGNOSIS — E119 Type 2 diabetes mellitus without complications: Secondary | ICD-10-CM | POA: Diagnosis not present

## 2021-07-23 DIAGNOSIS — F17210 Nicotine dependence, cigarettes, uncomplicated: Secondary | ICD-10-CM | POA: Diagnosis not present

## 2021-07-23 DIAGNOSIS — J449 Chronic obstructive pulmonary disease, unspecified: Secondary | ICD-10-CM | POA: Diagnosis not present

## 2021-07-23 DIAGNOSIS — Z833 Family history of diabetes mellitus: Secondary | ICD-10-CM | POA: Diagnosis not present

## 2021-07-23 LAB — BASIC METABOLIC PANEL
Anion gap: 5 (ref 5–15)
BUN: 7 mg/dL (ref 6–20)
CO2: 28 mmol/L (ref 22–32)
Calcium: 9.1 mg/dL (ref 8.9–10.3)
Chloride: 104 mmol/L (ref 98–111)
Creatinine, Ser: 0.8 mg/dL (ref 0.44–1.00)
GFR, Estimated: >60 mL/min (ref >60)
Glucose, Bld: 80 mg/dL (ref 70–99)
Potassium: 4.3 mmol/L (ref 3.5–5.1)
Sodium: 137 mmol/L (ref 135–145)

## 2021-07-23 NOTE — Progress Notes (Signed)
Discussed the need for any additional lab work with Dr. Elgie Congo. No new orders given. Will proceed with surgery as scheduled at [facility].

## 2021-07-23 NOTE — Anesthesia Preprocedure Evaluation (Addendum)
Anesthesia Evaluation  Patient identified by MRN, date of birth, ID band Patient awake    Reviewed: Allergy & Precautions, NPO status , Patient's Chart, lab work & pertinent test results  Airway Mallampati: III  TM Distance: >3 FB Neck ROM: Full    Dental  (+) Teeth Intact, Dental Advisory Given   Pulmonary shortness of breath, asthma , COPD,  COPD inhaler, Current SmokerPatient did not abstain from smoking.,    Pulmonary exam normal breath sounds clear to auscultation       Cardiovascular hypertension, Pt. on medications Normal cardiovascular exam Rhythm:Regular Rate:Normal  TTE 2017 - Normal LV size and systolic function, EF 55-20%. Normal RV size  and systolic function. No significant valvular abnormalities   Neuro/Psych Seizures - (in setting of EtOH),  PSYCHIATRIC DISORDERS Anxiety Depression Bipolar Disorder Schizophrenia    GI/Hepatic GERD  ,(+)     substance abuse (last use 2021)  alcohol use, cocaine use, marijuana use and IV drug use,   Endo/Other  Morbid obesity (BMI 42)  Renal/GU negative Renal ROS  negative genitourinary   Musculoskeletal  (+) Arthritis , narcotic dependent  Abdominal   Peds  Hematology negative hematology ROS (+)   Anesthesia Other Findings   Reproductive/Obstetrics                            Anesthesia Physical Anesthesia Plan  ASA: 3  Anesthesia Plan: General and Regional   Post-op Pain Management:  Regional for Post-op pain   Induction: Intravenous  PONV Risk Score and Plan: 2 and Ondansetron, Dexamethasone and Midazolam  Airway Management Planned: LMA  Additional Equipment:   Intra-op Plan:   Post-operative Plan: Extubation in OR  Informed Consent: I have reviewed the patients History and Physical, chart, labs and discussed the procedure including the risks, benefits and alternatives for the proposed anesthesia with the patient or  authorized representative who has indicated his/her understanding and acceptance.     Dental advisory given  Plan Discussed with: CRNA  Anesthesia Plan Comments:         Anesthesia Quick Evaluation

## 2021-07-23 NOTE — Progress Notes (Signed)

## 2021-07-24 ENCOUNTER — Encounter (HOSPITAL_BASED_OUTPATIENT_CLINIC_OR_DEPARTMENT_OTHER): Payer: Self-pay | Admitting: Orthopedic Surgery

## 2021-07-24 ENCOUNTER — Ambulatory Visit (HOSPITAL_BASED_OUTPATIENT_CLINIC_OR_DEPARTMENT_OTHER): Payer: Medicaid Other | Admitting: Anesthesiology

## 2021-07-24 ENCOUNTER — Ambulatory Visit (HOSPITAL_BASED_OUTPATIENT_CLINIC_OR_DEPARTMENT_OTHER): Payer: Medicaid Other

## 2021-07-24 ENCOUNTER — Encounter (HOSPITAL_BASED_OUTPATIENT_CLINIC_OR_DEPARTMENT_OTHER)
Admission: RE | Disposition: A | Discharge status: Home / Self Care | Payer: Self-pay | Source: Home / Self Care | Attending: Orthopedic Surgery

## 2021-07-24 ENCOUNTER — Ambulatory Visit (HOSPITAL_BASED_OUTPATIENT_CLINIC_OR_DEPARTMENT_OTHER)
Admission: RE | Admit: 2021-07-24 | Discharge: 2021-07-24 | Disposition: A | Discharge status: Home / Self Care | Payer: Medicaid Other | Attending: Orthopedic Surgery | Admitting: Orthopedic Surgery

## 2021-07-24 ENCOUNTER — Other Ambulatory Visit: Payer: Self-pay

## 2021-07-24 DIAGNOSIS — I10 Essential (primary) hypertension: Secondary | ICD-10-CM | POA: Insufficient documentation

## 2021-07-24 DIAGNOSIS — E119 Type 2 diabetes mellitus without complications: Secondary | ICD-10-CM | POA: Insufficient documentation

## 2021-07-24 DIAGNOSIS — Y939 Activity, unspecified: Secondary | ICD-10-CM | POA: Insufficient documentation

## 2021-07-24 DIAGNOSIS — Z888 Allergy status to other drugs, medicaments and biological substances status: Secondary | ICD-10-CM | POA: Insufficient documentation

## 2021-07-24 DIAGNOSIS — J449 Chronic obstructive pulmonary disease, unspecified: Secondary | ICD-10-CM | POA: Insufficient documentation

## 2021-07-24 DIAGNOSIS — E079 Disorder of thyroid, unspecified: Secondary | ICD-10-CM | POA: Insufficient documentation

## 2021-07-24 DIAGNOSIS — Z79899 Other long term (current) drug therapy: Secondary | ICD-10-CM | POA: Insufficient documentation

## 2021-07-24 DIAGNOSIS — Y92009 Unspecified place in unspecified non-institutional (private) residence as the place of occurrence of the external cause: Secondary | ICD-10-CM | POA: Insufficient documentation

## 2021-07-24 DIAGNOSIS — F17210 Nicotine dependence, cigarettes, uncomplicated: Secondary | ICD-10-CM | POA: Insufficient documentation

## 2021-07-24 DIAGNOSIS — W109XXA Fall (on) (from) unspecified stairs and steps, initial encounter: Secondary | ICD-10-CM | POA: Insufficient documentation

## 2021-07-24 DIAGNOSIS — S8262XA Displaced fracture of lateral malleolus of left fibula, initial encounter for closed fracture: Secondary | ICD-10-CM | POA: Insufficient documentation

## 2021-07-24 DIAGNOSIS — Z7951 Long term (current) use of inhaled steroids: Secondary | ICD-10-CM | POA: Insufficient documentation

## 2021-07-24 DIAGNOSIS — Z8249 Family history of ischemic heart disease and other diseases of the circulatory system: Secondary | ICD-10-CM | POA: Insufficient documentation

## 2021-07-24 DIAGNOSIS — Z96642 Presence of left artificial hip joint: Secondary | ICD-10-CM | POA: Insufficient documentation

## 2021-07-24 DIAGNOSIS — Z833 Family history of diabetes mellitus: Secondary | ICD-10-CM | POA: Insufficient documentation

## 2021-07-24 HISTORY — DX: Other psychoactive substance abuse, uncomplicated: F19.10

## 2021-07-24 HISTORY — PX: ORIF ANKLE FRACTURE: SHX5408

## 2021-07-24 HISTORY — DX: Other fracture of left lower leg, initial encounter for closed fracture: S82.892A

## 2021-07-24 SURGERY — OPEN REDUCTION INTERNAL FIXATION (ORIF) ANKLE FRACTURE
Anesthesia: Regional | Site: Ankle | Laterality: Left

## 2021-07-24 MED ORDER — MIDAZOLAM HCL 2 MG/2ML IJ SOLN
INTRAMUSCULAR | Status: AC
  Filled 2021-07-24: qty 2

## 2021-07-24 MED ORDER — ACETAMINOPHEN 500 MG PO TABS: 1000.0000 mg | ORAL_TABLET | Freq: Once | ORAL | Status: DC

## 2021-07-24 MED ORDER — DEXAMETHASONE SODIUM PHOSPHATE 10 MG/ML IJ SOLN
INTRAMUSCULAR | Status: DC | PRN
  Administered 2021-07-24: 5 mg

## 2021-07-24 MED ORDER — BUPIVACAINE HCL (PF) 0.5 % IJ SOLN
INTRAMUSCULAR | Status: DC | PRN
  Administered 2021-07-24: 20 mL via PERINEURAL

## 2021-07-24 MED ORDER — 0.9 % SODIUM CHLORIDE (POUR BTL) OPTIME
TOPICAL | Status: DC | PRN
  Administered 2021-07-24: 200 mL

## 2021-07-24 MED ORDER — PROPOFOL 500 MG/50ML IV EMUL
INTRAVENOUS | Status: AC
  Filled 2021-07-24: qty 50

## 2021-07-24 MED ORDER — EPHEDRINE 5 MG/ML INJ
INTRAVENOUS | Status: AC
  Filled 2021-07-24: qty 5

## 2021-07-24 MED ORDER — ACETAMINOPHEN 500 MG PO TABS
1000.0000 mg | ORAL_TABLET | Freq: Once | ORAL | Status: AC
  Administered 2021-07-24: 1000 mg via ORAL

## 2021-07-24 MED ORDER — CEFAZOLIN SODIUM-DEXTROSE 2-4 GM/100ML-% IV SOLN
INTRAVENOUS | Status: AC
  Filled 2021-07-24: qty 100

## 2021-07-24 MED ORDER — FENTANYL CITRATE (PF) 100 MCG/2ML IJ SOLN
INTRAMUSCULAR | Status: AC
  Filled 2021-07-24: qty 2

## 2021-07-24 MED ORDER — ONDANSETRON 4 MG PO TBDP: 4.0000 mg | ORAL_TABLET | Freq: Three times a day (TID) | ORAL | 0 refills | Status: DC | PRN

## 2021-07-24 MED ORDER — ONDANSETRON HCL 4 MG/2ML IJ SOLN
INTRAMUSCULAR | Status: AC
  Filled 2021-07-24: qty 2

## 2021-07-24 MED ORDER — BUPIVACAINE LIPOSOME 1.3 % IJ SUSP
INTRAMUSCULAR | Status: DC | PRN
  Administered 2021-07-24: 10 mL via PERINEURAL

## 2021-07-24 MED ORDER — FENTANYL CITRATE (PF) 100 MCG/2ML IJ SOLN
25.0000 ug | INTRAMUSCULAR | Status: DC | PRN
  Administered 2021-07-24: 50 ug via INTRAVENOUS

## 2021-07-24 MED ORDER — ROPIVACAINE HCL 5 MG/ML IJ SOLN
INTRAMUSCULAR | Status: DC | PRN
  Administered 2021-07-24: 20 mL via PERINEURAL

## 2021-07-24 MED ORDER — ONDANSETRON HCL 4 MG/2ML IJ SOLN
INTRAMUSCULAR | Status: DC | PRN
  Administered 2021-07-24: 4 mg via INTRAVENOUS

## 2021-07-24 MED ORDER — ACETAMINOPHEN 500 MG PO TABS
ORAL_TABLET | ORAL | Status: AC
  Filled 2021-07-24: qty 2

## 2021-07-24 MED ORDER — DEXAMETHASONE SODIUM PHOSPHATE 10 MG/ML IJ SOLN
INTRAMUSCULAR | Status: AC
  Filled 2021-07-24: qty 1

## 2021-07-24 MED ORDER — TRANEXAMIC ACID-NACL 1000-0.7 MG/100ML-% IV SOLN
INTRAVENOUS | Status: AC
  Filled 2021-07-24: qty 100

## 2021-07-24 MED ORDER — PROPOFOL 10 MG/ML IV BOLUS
INTRAVENOUS | Status: AC
  Filled 2021-07-24: qty 20

## 2021-07-24 MED ORDER — ACETAMINOPHEN 500 MG PO TABS: 1000.0000 mg | ORAL_TABLET | Freq: Three times a day (TID) | ORAL | 0 refills | Status: DC | PRN

## 2021-07-24 MED ORDER — MIDAZOLAM HCL 2 MG/2ML IJ SOLN
2.0000 mg | Freq: Once | INTRAMUSCULAR | Status: AC
  Administered 2021-07-24: 2 mg via INTRAVENOUS

## 2021-07-24 MED ORDER — FENTANYL CITRATE (PF) 100 MCG/2ML IJ SOLN
50.0000 ug | Freq: Once | INTRAMUSCULAR | Status: AC
  Administered 2021-07-24: 50 ug via INTRAVENOUS

## 2021-07-24 MED ORDER — HYDROCODONE-ACETAMINOPHEN 10-325 MG PO TABS: 1.0000 | ORAL_TABLET | Freq: Four times a day (QID) | ORAL | 0 refills | Status: DC | PRN

## 2021-07-24 MED ORDER — ASPIRIN EC 81 MG PO TBEC: 81.0000 mg | DELAYED_RELEASE_TABLET | Freq: Two times a day (BID) | ORAL | 0 refills | Status: DC

## 2021-07-24 MED ORDER — CEFAZOLIN SODIUM-DEXTROSE 2-4 GM/100ML-% IV SOLN
2.0000 g | INTRAVENOUS | Status: AC
  Administered 2021-07-24: 2 g via INTRAVENOUS

## 2021-07-24 MED ORDER — POVIDONE-IODINE 10 % EX SWAB: 2.0000 "application " | Freq: Once | CUTANEOUS | Status: DC

## 2021-07-24 MED ORDER — MIDAZOLAM HCL 5 MG/5ML IJ SOLN
INTRAMUSCULAR | Status: DC | PRN
  Administered 2021-07-24: 1 mg via INTRAVENOUS

## 2021-07-24 MED ORDER — TRANEXAMIC ACID-NACL 1000-0.7 MG/100ML-% IV SOLN: 1000.0000 mg | INTRAVENOUS | Status: DC

## 2021-07-24 MED ORDER — PROPOFOL 10 MG/ML IV BOLUS
INTRAVENOUS | Status: DC | PRN
  Administered 2021-07-24: 200 mg via INTRAVENOUS

## 2021-07-24 MED ORDER — LACTATED RINGERS IV SOLN: INTRAVENOUS | Status: DC

## 2021-07-24 MED ORDER — LIDOCAINE 2% (20 MG/ML) 5 ML SYRINGE
INTRAMUSCULAR | Status: AC
  Filled 2021-07-24: qty 5

## 2021-07-24 MED ORDER — LIDOCAINE HCL (CARDIAC) PF 100 MG/5ML IV SOSY
PREFILLED_SYRINGE | INTRAVENOUS | Status: DC | PRN
  Administered 2021-07-24: 5 mg via INTRAVENOUS
  Administered 2021-07-24: 100 mg via INTRAVENOUS

## 2021-07-24 MED ORDER — FENTANYL CITRATE (PF) 100 MCG/2ML IJ SOLN
INTRAMUSCULAR | Status: DC | PRN
  Administered 2021-07-24: 25 ug via INTRAVENOUS

## 2021-07-24 SURGICAL SUPPLY — 74 items
APL PRP STRL LF DISP 70% ISPRP (MISCELLANEOUS) ×1
BANDAGE ESMARK 6X9 LF (GAUZE/BANDAGES/DRESSINGS) ×1 IMPLANT
BIT DRILL 3.5X122MM AO FIT (BIT) ×2 IMPLANT
BLADE SURG 15 STRL LF DISP TIS (BLADE) ×2 IMPLANT
BLADE SURG 15 STRL SS (BLADE) ×4
BNDG CMPR 9X6 STRL LF SNTH (GAUZE/BANDAGES/DRESSINGS) ×1
BNDG COHESIVE 4X5 TAN ST LF (GAUZE/BANDAGES/DRESSINGS) ×2 IMPLANT
BNDG ELASTIC 4X5.8 VLCR STR LF (GAUZE/BANDAGES/DRESSINGS) ×2 IMPLANT
BNDG ELASTIC 6X5.8 VLCR STR LF (GAUZE/BANDAGES/DRESSINGS) ×2 IMPLANT
BNDG ESMARK 6X9 LF (GAUZE/BANDAGES/DRESSINGS) ×2
CHLORAPREP W/TINT 26 (MISCELLANEOUS) ×2 IMPLANT
CLSR STERI-STRIP ANTIMIC 1/2X4 (GAUZE/BANDAGES/DRESSINGS) ×2 IMPLANT
COUNTERSINK (MISCELLANEOUS) ×2
COVER BACK TABLE 60X90IN (DRAPES) ×2 IMPLANT
CUFF TOURN SGL QUICK 24 (TOURNIQUET CUFF)
CUFF TOURN SGL QUICK 34 (TOURNIQUET CUFF)
CUFF TRNQT CYL 24X4X16.5-23 (TOURNIQUET CUFF) IMPLANT
CUFF TRNQT CYL 34X4.125X (TOURNIQUET CUFF) IMPLANT
DECANTER SPIKE VIAL GLASS SM (MISCELLANEOUS) IMPLANT
DRAPE EXTREMITY T 121X128X90 (DISPOSABLE) ×2 IMPLANT
DRAPE IMP U-DRAPE 54X76 (DRAPES) ×2 IMPLANT
DRAPE OEC MINIVIEW 54X84 (DRAPES) ×2 IMPLANT
DRAPE U-SHAPE 47X51 STRL (DRAPES) ×2 IMPLANT
DRILL 2.6X122MM WL AO SHAFT (BIT) ×2 IMPLANT
DRSG EMULSION OIL 3X3 NADH (GAUZE/BANDAGES/DRESSINGS) ×2 IMPLANT
DRSG PAD ABDOMINAL 8X10 ST (GAUZE/BANDAGES/DRESSINGS) ×2 IMPLANT
ELECT REM PT RETURN 9FT ADLT (ELECTROSURGICAL) ×2
ELECTRODE REM PT RTRN 9FT ADLT (ELECTROSURGICAL) ×1 IMPLANT
GAUZE SPONGE 4X4 12PLY STRL (GAUZE/BANDAGES/DRESSINGS) ×2 IMPLANT
GLOVE SRG 8 PF TXTR STRL LF DI (GLOVE) ×1 IMPLANT
GLOVE SURG ENC MOIS LTX SZ7.5 (GLOVE) ×4 IMPLANT
GLOVE SURG UNDER POLY LF SZ7.5 (GLOVE) ×2 IMPLANT
GLOVE SURG UNDER POLY LF SZ8 (GLOVE) ×2
GOWN STRL REUS W/ TWL LRG LVL3 (GOWN DISPOSABLE) ×2 IMPLANT
GOWN STRL REUS W/ TWL XL LVL3 (GOWN DISPOSABLE) ×1 IMPLANT
GOWN STRL REUS W/TWL LRG LVL3 (GOWN DISPOSABLE) ×4
GOWN STRL REUS W/TWL XL LVL3 (GOWN DISPOSABLE) ×2
NEEDLE HYPO 22GX1.5 SAFETY (NEEDLE) IMPLANT
NS IRRIG 1000ML POUR BTL (IV SOLUTION) ×2 IMPLANT
PACK BASIN DAY SURGERY FS (CUSTOM PROCEDURE TRAY) ×2 IMPLANT
PAD CAST 4YDX4 CTTN HI CHSV (CAST SUPPLIES) ×1 IMPLANT
PADDING CAST ABS 4INX4YD NS (CAST SUPPLIES) ×2
PADDING CAST ABS COTTON 4X4 ST (CAST SUPPLIES) ×2 IMPLANT
PADDING CAST COTTON 4X4 STRL (CAST SUPPLIES) ×2
PADDING CAST COTTON 6X4 STRL (CAST SUPPLIES) ×2 IMPLANT
PENCIL SMOKE EVACUATOR (MISCELLANEOUS) ×2 IMPLANT
PLATE TUBUAL 1/3 6H (Plate) ×2 IMPLANT
SCREW CANC FT 16X4X2.5XHEX (Screw) ×1 IMPLANT
SCREW CANCELLOUS 4.0X14 (Screw) ×2 IMPLANT
SCREW CANCELLOUS 4.0X16MM (Screw) ×2 IMPLANT
SCREW CORTEX ST MATTA 3.5X12MM (Screw) ×4 IMPLANT
SCREW CORTEX ST MATTA 3.5X14 (Screw) ×2 IMPLANT
SCREW CORTEX ST MATTA 3.5X26MM (Screw) ×2 IMPLANT
SCREW COUNTERSINK (MISCELLANEOUS) ×1 IMPLANT
SLEEVE SCD COMPRESS KNEE MED (STOCKING) ×2 IMPLANT
SPLINT FAST PLASTER 5X30 (CAST SUPPLIES) ×20
SPLINT PLASTER CAST FAST 5X30 (CAST SUPPLIES) ×20 IMPLANT
SPONGE T-LAP 4X18 ~~LOC~~+RFID (SPONGE) ×2 IMPLANT
SUCTION FRAZIER HANDLE 10FR (MISCELLANEOUS) ×1
SUCTION TUBE FRAZIER 10FR DISP (MISCELLANEOUS) ×1 IMPLANT
SUT ETHILON 3 0 PS 1 (SUTURE) IMPLANT
SUT MNCRL AB 4-0 PS2 18 (SUTURE) IMPLANT
SUT MON AB 2-0 CT1 36 (SUTURE) IMPLANT
SUT MON AB 3-0 SH 27 (SUTURE)
SUT MON AB 3-0 SH27 (SUTURE) IMPLANT
SUT VIC AB 0 SH 27 (SUTURE) ×2 IMPLANT
SUT VIC AB 2-0 SH 27 (SUTURE)
SUT VIC AB 2-0 SH 27XBRD (SUTURE) IMPLANT
SUT VICRYL 0 SH 27 (SUTURE) ×2 IMPLANT
SYR BULB EAR ULCER 3OZ GRN STR (SYRINGE) ×2 IMPLANT
SYR CONTROL 10ML LL (SYRINGE) IMPLANT
TOWEL GREEN STERILE FF (TOWEL DISPOSABLE) ×4 IMPLANT
TUBE CONNECTING 20X1/4 (TUBING) ×2 IMPLANT
UNDERPAD 30X36 HEAVY ABSORB (UNDERPADS AND DIAPERS) ×2 IMPLANT

## 2021-07-24 NOTE — Anesthesia Procedure Notes (Signed)
Anesthesia Regional Block: Popliteal block   Pre-Anesthetic Checklist: , timeout performed,  Correct Patient, Correct Site, Correct Laterality,  Correct Procedure, Correct Position, site marked,  Risks and benefits discussed,  Surgical consent,  Pre-op evaluation,  At surgeon's request and post-op pain management  Laterality: Left  Prep: Maximum Sterile Barrier Precautions used, chloraprep       Needles:  Injection technique: Single-shot  Needle Type: Echogenic Stimulator Needle     Needle Length: 9cm  Needle Gauge: 22     Additional Needles:   Procedures:,,,, ultrasound used (permanent image in chart),,    Narrative:  Start time: 07/24/2021 10:34 AM End time: 07/24/2021 10:38 AM Injection made incrementally with aspirations every 5 mL.  Performed by: Personally  Anesthesiologist: Freddrick March, MD  Additional Notes: Monitors applied. No increased pain on injection. No increased resistance to injection. Injection made in 5cc increments. Good needle visualization. Patient tolerated procedure well.

## 2021-07-24 NOTE — Op Note (Signed)
07/24/2021  11:58 AM  PATIENT:  Alicia Blake    PRE-OPERATIVE DIAGNOSIS:  LEFT ANKLE FRACTURE  POST-OPERATIVE DIAGNOSIS:  Same  PROCEDURE:  OPEN REDUCTION INTERNAL FIXATION (ORIF) ANKLE FRACTURE  SURGEON:  Renette Butters, MD  ASSISTANT: Aggie Moats, PA-C, he was present and scrubbed throughout the case, critical for completion in a timely fashion, and for retraction, instrumentation, and closure.   ANESTHESIA:   gen   PREOPERATIVE INDICATIONS:  [name] is a  55 y.o. female with a diagnosis of LEFT ANKLE FRACTURE who failed conservative measures and elected for surgical management.    The risks benefits and alternatives were discussed with the patient preoperatively including but not limited to the risks of infection, bleeding, nerve injury, cardiopulmonary complications, the need for revision surgery, among others, and the patient was willing to proceed.  OPERATIVE IMPLANTS: stryker plate  OPERATIVE FINDINGS: Unstable ankle fracture. Stable syndesmosis post op  BLOOD LOSS: min  COMPLICATIONS: none  TOURNIQUET TIME: 41min  OPERATIVE PROCEDURE:  Patient was identified in the preoperative holding area and site was marked by me He was transported to the operating theater and placed on the table in supine position taking care to pad all bony prominences. After a preincinduction time out anesthesia was induced. The left lower extremity was prepped and draped in normal sterile fashion and a pre-incision timeout was performed. Alicia Blake received ancef for preoperative antibiotics.   I made a lateral incision of roughly 7 cm dissection was carried down sharply to the distal fibula and then spreading dissection was used proximally to protect the superficial peroneal nerve. I sharply incised the periosteum and took care to protect the peroneal tendons. I then debrided the fracture site and performed a reduction maneuver which was held in place with a clamp.   I placed a lag  screw across the fracture  I then selected a 6-hole one third tubular plate and placed in a neutralization fashion care was taken distally so as not to penetrate the joint with the cancellus screws.   I then stressed the syndesmosis and it was stable   The wound was then thoroughly irrigated and closed using a 0 Vicryl and absorbable Monocryl sutures. He was placed in a short leg splint.   POST OPERATIVE PLAN: Non-weightbearing. DVT prophylaxis will consist of mobilization and chemical px

## 2021-07-24 NOTE — Transfer of Care (Signed)
Immediate Anesthesia Transfer of Care Note  Patient: Alicia Blake  Procedure(s) Performed: OPEN REDUCTION INTERNAL FIXATION (ORIF) ANKLE FRACTURE (Left: Ankle)  Patient Location: PACU  Anesthesia Type:General  Level of Consciousness: awake  Airway & Oxygen Therapy: Patient Spontanous Breathing and Patient connected to nasal cannula oxygen  Post-op Assessment: Report given to RN and Post -op Vital signs reviewed and stable  Post vital signs: Reviewed and stable  Last Vitals:  Vitals Value Taken Time  BP 133/115 07/24/21 1217  Temp    Pulse 84 07/24/21 1221  Resp 18 07/24/21 1221  SpO2 100 % 07/24/21 1221  Vitals shown include unvalidated device data.  Last Pain:  Vitals:   07/24/21 0941  TempSrc: Oral  PainSc: 8       Patients Stated Pain Goal: 3 (14/99/69 2493)  Complications: No notable events documented.

## 2021-07-24 NOTE — Interval H&P Note (Signed)
History and Physical Interval Note:  07/24/2021 10:38 AM  Alicia Blake  has presented today for surgery, with the diagnosis of LEFT ANKLE FRACTURE.  The various methods of treatment have been discussed with the patient and family. After consideration of risks, benefits and other options for treatment, the patient has consented to  Procedure(s): OPEN REDUCTION INTERNAL FIXATION (ORIF) ANKLE FRACTURE (Left) as a surgical intervention.  The patient's history has been reviewed, patient examined, no change in status, stable for surgery.  I have reviewed the patient's chart and labs.  Questions were answered to the patient's satisfaction.     Renette Butters

## 2021-07-24 NOTE — Discharge Instructions (Addendum)
POST-OPERATIVE OPIOID TAPER INSTRUCTIONS: It is important to wean off of your opioid medication as soon as possible. If you do not need pain medication after your surgery it is ok to stop day one. Opioids include: Codeine, Hydrocodone(Norco, Vicodin), Oxycodone(Percocet, oxycontin) and hydromorphone amongst others.  Long term and even short term use of opiods can cause: Increased pain response Dependence Constipation Depression Respiratory depression And more.  Withdrawal symptoms can include Flu like symptoms Nausea, vomiting And more Techniques to manage these symptoms Hydrate well Eat regular healthy meals Stay active Use relaxation techniques(deep breathing, meditating, yoga) Do Not substitute Alcohol to help with tapering If you have been on opioids for less than two weeks and do not have pain than it is ok to stop all together.  Plan to wean off of opioids This plan should start within one week post op of your joint replacement. Maintain the same interval or time between taking each dose and first decrease the dose.  Cut the total daily intake of opioids by one tablet each day Next start to increase the time between doses. The last dose that should be eliminated is the evening dose.    Post Anesthesia Home Care Instructions  Activity: Get plenty of rest for the remainder of the day. A responsible individual must stay with you for 24 hours following the procedure.  For the next 24 hours, DO NOT: -Drive a car -Paediatric nurse -Drink alcoholic beverages -Take any medication unless instructed by your physician -Make any legal decisions or sign important papers.  Meals: Start with liquid foods such as gelatin or soup. Progress to regular foods as tolerated. Avoid greasy, spicy, heavy foods. If nausea and/or vomiting occur, drink only clear liquids until the nausea and/or vomiting subsides. Call your physician if vomiting continues.  Special Instructions/Symptoms: Your  throat may feel dry or sore from the anesthesia or the breathing tube placed in your throat during surgery. If this causes discomfort, gargle with warm salt water. The discomfort should disappear within 24 hours.  If you had a scopolamine patch placed behind your ear for the management of post- operative nausea and/or vomiting:  1. The medication in the patch is effective for 72 hours, after which it should be removed.  Wrap patch in a tissue and discard in the trash. Wash hands thoroughly with soap and water. 2. You may remove the patch earlier than 72 hours if you experience unpleasant side effects which may include dry mouth, dizziness or visual disturbances. 3. Avoid touching the patch. Wash your hands with soap and water after contact with the patch.  Regional Anesthesia Blocks  1. Numbness or the inability to move the "blocked" extremity may last from 3-48 hours after placement. The length of time depends on the medication injected and your individual response to the medication. If the numbness is not going away after 48 hours, call your surgeon.  2. The extremity that is blocked will need to be protected until the numbness is gone and the  Strength has returned. Because you cannot feel it, you will need to take extra care to avoid injury. Because it may be weak, you may have difficulty moving it or using it. You may not know what position it is in without looking at it while the block is in effect.  3. For blocks in the legs and feet, returning to weight bearing and walking needs to be done carefully. You will need to wait until the numbness is entirely gone and the strength  has returned. You should be able to move your leg and foot normally before you try and bear weight or walk. You will need someone to be with you when you first try to ensure you do not fall and possibly risk injury.  4. Bruising and tenderness at the needle site are common side effects and will resolve in a few days.  5.  Persistent numbness or new problems with movement should be communicated to the surgeon or the [facility] [phone] [place] [phone]).   Next dose of Tylenol can be taken at 4pm today if needed.

## 2021-07-24 NOTE — Progress Notes (Signed)
Assisted Dr. Lanetta Inch with left, ultrasound guided, popliteal/saphenous, adductor canal block. Side rails up, monitors on throughout procedure. See vital signs in flow sheet. Tolerated Procedure well.

## 2021-07-24 NOTE — Anesthesia Procedure Notes (Signed)
Procedure Name: LMA Insertion Date/Time: 07/24/2021 11:21 AM Performed by: Bufford Spikes, CRNA Pre-anesthesia Checklist: Patient identified, Emergency Drugs available, Suction available and Patient being monitored Patient Re-evaluated:Patient Re-evaluated prior to induction Oxygen Delivery Method: Circle system utilized Preoxygenation: Pre-oxygenation with 100% oxygen Induction Type: IV induction Ventilation: Mask ventilation without difficulty LMA: LMA inserted LMA Size: 4.0 Number of attempts: 1 Airway Equipment and Method: Bite block Placement Confirmation: positive ETCO2 Tube secured with: Tape Dental Injury: Teeth and Oropharynx as per pre-operative assessment

## 2021-07-24 NOTE — Anesthesia Procedure Notes (Signed)
Anesthesia Regional Block: Adductor canal block   Pre-Anesthetic Checklist: , timeout performed,  Correct Patient, Correct Site, Correct Laterality,  Correct Procedure, Correct Position, site marked,  Risks and benefits discussed,  Surgical consent,  Pre-op evaluation,  At surgeon's request and post-op pain management  Laterality: Left  Prep: Maximum Sterile Barrier Precautions used, chloraprep       Needles:  Injection technique: Single-shot  Needle Type: Echogenic Stimulator Needle     Needle Length: 9cm  Needle Gauge: 22     Additional Needles:   Procedures:,,,, ultrasound used (permanent image in chart),,    Narrative:  Start time: 07/24/2021 10:38 AM End time: 07/24/2021 10:41 AM Injection made incrementally with aspirations every 5 mL.  Performed by: Personally  Anesthesiologist: Freddrick March, MD  Additional Notes: Monitors applied. No increased pain on injection. No increased resistance to injection. Injection made in 5cc increments. Good needle visualization. Patient tolerated procedure well.

## 2021-07-24 NOTE — Anesthesia Postprocedure Evaluation (Signed)
Anesthesia Post Note  Patient: MEKENNA FINAU  Procedure(s) Performed: OPEN REDUCTION INTERNAL FIXATION (ORIF) ANKLE FRACTURE (Left: Ankle)     Patient location during evaluation: PACU Anesthesia Type: Regional and General Level of consciousness: awake and alert Pain management: pain level controlled Vital Signs Assessment: post-procedure vital signs reviewed and stable Respiratory status: spontaneous breathing, nonlabored ventilation, respiratory function stable and patient connected to nasal cannula oxygen Cardiovascular status: blood pressure returned to baseline and stable Postop Assessment: no apparent nausea or vomiting Anesthetic complications: no   No notable events documented.  Last Vitals:  Vitals:   07/24/21 1250 07/24/21 1325  BP:  (!) 143/71  Pulse: 77 81  Resp: 15 16  Temp:  36.6 C  SpO2: 95% 96%    Last Pain:  Vitals:   07/24/21 1325  TempSrc:   PainSc: 0-No pain                 Jackilyn Umphlett L Hildred Pharo

## 2021-07-26 ENCOUNTER — Encounter (HOSPITAL_BASED_OUTPATIENT_CLINIC_OR_DEPARTMENT_OTHER): Payer: Self-pay | Admitting: Orthopedic Surgery

## 2021-07-27 ENCOUNTER — Encounter: Payer: Self-pay | Admitting: Internal Medicine

## 2021-07-27 ENCOUNTER — Other Ambulatory Visit: Payer: Self-pay

## 2021-07-27 ENCOUNTER — Ambulatory Visit (AMBULATORY_SURGERY_CENTER): Payer: Medicaid Other | Admitting: Internal Medicine

## 2021-07-27 VITALS — BP 130/59 | HR 78 | Temp 97.5°F | Resp 21 | Ht 66.0 in | Wt 259.0 lb

## 2021-07-27 DIAGNOSIS — D123 Benign neoplasm of transverse colon: Secondary | ICD-10-CM | POA: Diagnosis not present

## 2021-07-27 DIAGNOSIS — K625 Hemorrhage of anus and rectum: Secondary | ICD-10-CM

## 2021-07-27 DIAGNOSIS — R109 Unspecified abdominal pain: Secondary | ICD-10-CM

## 2021-07-27 DIAGNOSIS — Z1211 Encounter for screening for malignant neoplasm of colon: Secondary | ICD-10-CM | POA: Diagnosis not present

## 2021-07-27 DIAGNOSIS — R194 Change in bowel habit: Secondary | ICD-10-CM

## 2021-07-27 DIAGNOSIS — K5730 Diverticulosis of large intestine without perforation or abscess without bleeding: Secondary | ICD-10-CM

## 2021-07-27 DIAGNOSIS — K649 Unspecified hemorrhoids: Secondary | ICD-10-CM

## 2021-07-27 MED ORDER — SODIUM CHLORIDE 0.9 % IV SOLN: 500.0000 mL | Freq: Once | INTRAVENOUS | Status: DC

## 2021-07-27 NOTE — Progress Notes (Signed)
Pt's states no medical or surgical changes since previsit or office visit. VS assessed by C.W 

## 2021-07-27 NOTE — Progress Notes (Signed)
GASTROENTEROLOGY PROCEDURE H&P NOTE   Primary Care Physician: Nolene Ebbs, MD    Reason for Procedure:   Colon cancer screening, rectal bleeding  Plan:    Colonoscopy  Patient is appropriate for endoscopic procedure(s) in the ambulatory ([place]) setting.  The nature of the procedure, as well as the risks, benefits, and alternatives were carefully and thoroughly reviewed with the patient. Ample time for discussion and questions allowed. The patient understood, was satisfied, and agreed to proceed.     HPI: Alicia Blake is a 55 y.o. female who presents for colonoscopy for evaluation of rectal bleeding and for colon cancer screening.  Patient was most recently seen in the Gastroenterology Clinic on 07/06/21.  No interval change in medical history since that appointment. Please refer to that note for full details regarding GI history and clinical presentation.   Colonoscopy 12/14/15: right-sided diverticulosis small internal hemorrhoids incomplete visualization of the cecum and parts of the transverse colon. Recommended 5-10 repeat colonoscopy  Past Medical History:  Diagnosis Date   Asthma    Bipolar affect, depressed ([place])    Bronchitis    Chronic back pain    Closed left ankle fracture    COPD (chronic obstructive pulmonary disease) (HCC)    DJD (degenerative joint disease)    BACK   GERD (gastroesophageal reflux disease)    Hypertension    Pneumonia    Pre-diabetes    dr entered diabetes without complications- no meds does not test sugar at home   PTSD (post-traumatic stress disorder)    Scoliosis    Seizures ([place])    "when drinking" last yrs ago   Shortness of breath dyspnea    exersion   Substance abuse ([place])    crack cocaine, 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, none since   Thyroid disease    UTI (lower urinary tract infection)     Past Surgical History:  Procedure Laterality Date   2 LEFT TOES SURGERY  12/2014   finished only on right side   BACK SURGERY      BALLOON DILATION N/A 12/14/2015   Procedure: BALLOON DILATION;  Surgeon: Teena Irani, MD;  Location: [facility];  Service: Endoscopy;  Laterality: N/A;   COLONOSCOPY WITH PROPOFOL N/A 12/14/2015   Procedure: COLONOSCOPY WITH PROPOFOL;  Surgeon: Teena Irani, MD;  Location: [facility];  Service: Endoscopy;  Laterality: N/A;   ESOPHAGOGASTRODUODENOSCOPY (EGD) WITH PROPOFOL N/A 12/14/2015   Procedure: ESOPHAGOGASTRODUODENOSCOPY (EGD) WITH PROPOFOL;  Surgeon: Teena Irani, MD;  Location: [facility];  Service: Endoscopy;  Laterality: N/A;   JOINT REPLACEMENT     left hip   LEFT ANKLE SURGERY  MARCH 2016   LUMBAR LAMINECTOMY/DECOMPRESSION MICRODISCECTOMY N/A 06/18/2017   Procedure: LAMINECTOMY AND FORAMINOTOMY LUMBAR FOUR - LUMBAR FIVE;  Surgeon: Ashok Pall, MD;  Location: [facility];  Service: Neurosurgery;  Laterality: N/A;  LAMINECTOMY AND FORAMINOTOMY LUMBAR 4- LUMBAR 5   LUMBAR WOUND DEBRIDEMENT N/A 03/10/2020   Procedure: Lumbar Wound Debridement;  Surgeon: Ashok Pall, MD;  Location: [facility];  Service: Neurosurgery;  Laterality: N/A;  posterior   NECK SURGERY  AS TEENAGER   STITCHES TO NECK    ORIF ANKLE FRACTURE Left 07/24/2021   Procedure: OPEN REDUCTION INTERNAL FIXATION (ORIF) ANKLE FRACTURE;  Surgeon: Renette Butters, MD;  Location: [facility];  Service: Orthopedics;  Laterality: Left;   SURGERY FOR CUT ON STOMACH  TEENAGER   TOTAL HIP ARTHROPLASTY Left 05/14/2016   Procedure: TOTAL HIP ARTHROPLASTY ANTERIOR APPROACH;  Surgeon: Renette Butters,  MD;  Location: [facility];  Service: Orthopedics;  Laterality: Left;    Prior to Admission medications   Medication Sig Start Date End Date Taking? Authorizing Provider  ABILIFY MAINTENA 400 MG PRSY prefilled syringe SMARTSIG:1 Injection IM Once a Month 06/13/21   [provider]  acetaminophen (TYLENOL) 500 MG tablet Take 2 tablets (1,000 mg total) by mouth every 8 (eight) hours as needed for mild pain or moderate pain. 07/24/21    Britt Bottom, PA-C  albuterol (PROVENTIL) (2.5 MG/3ML) 0.083% nebulizer solution Take 2.5 mg by nebulization every 6 (six) hours as needed for wheezing or shortness of breath.    [provider]  albuterol (VENTOLIN HFA) 108 (90 Base) MCG/ACT inhaler Inhale 2 puffs into the lungs every 6 (six) hours as needed for wheezing. 10/11/19   Chase Picket, MD  aspirin EC 81 MG tablet Take 1 tablet (81 mg total) by mouth 2 (two) times daily. For DVT prophylaxis for 30 days after surgery. 07/24/21   Britt Bottom, PA-C  atomoxetine (STRATTERA) 40 MG capsule Take 40 mg by mouth daily. 09/18/19   Elbert Ewings, FNP  Calcium Carb-Cholecalciferol (CALCIUM 600/VITAMIN D3 PO) Take 1 tablet by mouth daily.    [provider]  dicyclomine (BENTYL) 20 MG tablet Take 20 mg by mouth 4 (four) times daily. 02/25/20   [provider]  esomeprazole (NEXIUM) 40 MG capsule Take 40 mg by mouth daily. 09/22/19   [provider]  fluticasone (FLONASE) 50 MCG/ACT nasal spray Place 1 spray into both nostrils daily.    [provider]  Fluticasone-Salmeterol (ADVAIR) 100-50 MCG/DOSE AEPB Inhale 1 puff into the lungs at bedtime.    [provider]  furosemide (LASIX) 20 MG tablet Take 20-60 mg by mouth daily as needed for fluid or edema.    [provider]  HYDROcodone-acetaminophen (NORCO) 10-325 MG tablet Take 1 tablet by mouth every 6 (six) hours as needed for severe pain. Do not take more than 6 tablets in a 24 hour period. 07/24/21   Britt Bottom, PA-C  hydrOXYzine (VISTARIL) 25 MG capsule Take 25 mg by mouth 3 (three) times daily. 06/16/19   [provider]  ibuprofen (ADVIL) 800 MG tablet Take 800 mg by mouth 2 (two) times daily as needed. 03/15/21   [provider]  lithium carbonate 300 MG capsule Take 600 mg by mouth 2 (two) times daily with a meal. 09/18/19   [provider]  losartan (COZAAR) 25 MG tablet Take 25 mg by mouth  daily. 09/18/19   Trey Sailors, PA  Multiple Vitamin (MULTIVITAMIN) tablet Take 1 tablet by mouth daily.    [provider]  ondansetron (ZOFRAN ODT) 4 MG disintegrating tablet Take 1 tablet (4 mg total) by mouth every 8 (eight) hours as needed for nausea or vomiting. 07/24/21   Aggie Moats M, PA-C  PARoxetine (PAXIL) 40 MG tablet Take 40 mg by mouth daily.    [provider]  QUEtiapine (SEROQUEL) 200 MG tablet Take 200 mg by mouth at bedtime.     [provider]  tiZANidine (ZANAFLEX) 4 MG tablet Take 4 mg by mouth every 8 (eight) hours as needed for muscle spasms.     [provider]  vitamin B-12 (CYANOCOBALAMIN) 500 MCG tablet Take 500 mcg by mouth daily.    [provider]    Current Outpatient Medications  Medication Sig Dispense Refill   ABILIFY MAINTENA 400 MG PRSY prefilled syringe SMARTSIG:1 Injection  IM Once a Month     acetaminophen (TYLENOL) 500 MG tablet Take 2 tablets (1,000 mg total) by mouth every 8 (eight) hours as needed for mild pain or moderate pain. 60 tablet 0   albuterol (PROVENTIL) (2.5 MG/3ML) 0.083% nebulizer solution Take 2.5 mg by nebulization every 6 (six) hours as needed for wheezing or shortness of breath.     albuterol (VENTOLIN HFA) 108 (90 Base) MCG/ACT inhaler Inhale 2 puffs into the lungs every 6 (six) hours as needed for wheezing. 18 g 0   aspirin EC 81 MG tablet Take 1 tablet (81 mg total) by mouth 2 (two) times daily. For DVT prophylaxis for 30 days after surgery. 60 tablet 0   atomoxetine (STRATTERA) 40 MG capsule Take 40 mg by mouth daily.     Calcium Carb-Cholecalciferol (CALCIUM 600/VITAMIN D3 PO) Take 1 tablet by mouth daily.     dicyclomine (BENTYL) 20 MG tablet Take 20 mg by mouth 4 (four) times daily.     esomeprazole (NEXIUM) 40 MG capsule Take 40 mg by mouth daily.     fluticasone (FLONASE) 50 MCG/ACT nasal spray Place 1 spray into both nostrils daily.     Fluticasone-Salmeterol (ADVAIR) 100-50  MCG/DOSE AEPB Inhale 1 puff into the lungs at bedtime.     furosemide (LASIX) 20 MG tablet Take 20-60 mg by mouth daily as needed for fluid or edema.     HYDROcodone-acetaminophen (NORCO) 10-325 MG tablet Take 1 tablet by mouth every 6 (six) hours as needed for severe pain. Do not take more than 6 tablets in a 24 hour period. 28 tablet 0   hydrOXYzine (VISTARIL) 25 MG capsule Take 25 mg by mouth 3 (three) times daily.     ibuprofen (ADVIL) 800 MG tablet Take 800 mg by mouth 2 (two) times daily as needed.     lithium carbonate 300 MG capsule Take 600 mg by mouth 2 (two) times daily with a meal.     losartan (COZAAR) 25 MG tablet Take 25 mg by mouth daily.     Multiple Vitamin (MULTIVITAMIN) tablet Take 1 tablet by mouth daily.     ondansetron (ZOFRAN ODT) 4 MG disintegrating tablet Take 1 tablet (4 mg total) by mouth every 8 (eight) hours as needed for nausea or vomiting. 20 tablet 0   PARoxetine (PAXIL) 40 MG tablet Take 40 mg by mouth daily.     QUEtiapine (SEROQUEL) 200 MG tablet Take 200 mg by mouth at bedtime.      tiZANidine (ZANAFLEX) 4 MG tablet Take 4 mg by mouth every 8 (eight) hours as needed for muscle spasms.      vitamin B-12 (CYANOCOBALAMIN) 500 MCG tablet Take 500 mcg by mouth daily.     No current facility-administered medications for this visit.    Allergies as of 07/27/2021 - Review Complete 07/26/2021  Allergen Reaction Noted   Risperidone and related Swelling and Other (See Comments) 04/23/2017   Buspirone Swelling 04/07/2016   Meloxicam Other (See Comments) 05/07/2016   Prednisone Other (See Comments) 06/17/2017   Gabapentin Nausea And Vomiting and Swelling 04/22/2016    Family History  Problem Relation Age of Onset   Hypertension Mother    Hypertension Maternal Grandmother    Hypertension Paternal Grandmother    Diabetes Paternal Grandmother    Hypertension Paternal Grandfather    Diabetes Paternal Grandfather    Alcohol abuse Paternal Aunt    Mental illness  Cousin    Heart attack Cousin    Heart attack  Maternal Uncle     Social History   Socioeconomic History   Marital status: Single    Spouse name: Not on file   Number of children: Not on file   Years of education: Not on file   Highest education level: Not on file  Occupational History   Not on file  Tobacco Use   Smoking status: Every Day    Packs/day: 1.00    Years: 26.00    Pack years: 26.00    Types: Cigarettes   Smokeless tobacco: Never  Vaping Use   Vaping Use: Never used  Substance and Sexual Activity   Alcohol use: No    Comment: quit date 01/17/2013   Drug use: No    Types: "Crack" cocaine, Heroin    Comment: none since 03-2020   Sexual activity: Yes    Birth control/protection: None, Post-menopausal  Other Topics Concern   Not on file  Social History Narrative   Not on file   Social Determinants of Health   Financial Resource Strain: Not on file  Food Insecurity: Not on file  Transportation Needs: Not on file  Physical Activity: Not on file  Stress: Not on file  Social Connections: Not on file  Intimate Partner Violence: Not on file    Physical Exam: Vital signs in last 24 hours: LMP 12/09/2015 Comment: very irregular GEN: NAD EYE: Sclerae anicteric ENT: MMM CV: Non-tachycardic Pulm: CTA b/l GI: Soft NEURO:  Alert & Oriented   [name], MD [facility]   07/27/2021 12:17 PM

## 2021-07-27 NOTE — Progress Notes (Signed)
Called to room to assist during endoscopic procedure.  Patient ID and intended procedure confirmed with present staff. Received instructions for my participation in the procedure from the performing physician.  

## 2021-07-27 NOTE — Op Note (Signed)
[facility] Patient Name: Alicia Blake Procedure Date: 07/27/2021 12:13 PM MRN: [id number] Endoscopist: Sonny Masters "Alicia Blake ,  Age: 55 Referring MD:  Date of Birth: March 28, 1966 Gender: Female Account #: [id number] Procedure:                Colonoscopy Indications:              Screening for colorectal malignant neoplasm Medicines:                Monitored Anesthesia Care Procedure:                Pre-Anesthesia Assessment:                           - Prior to the procedure, a History and Physical                            was performed, and patient medications and                            allergies were reviewed. The patient's tolerance of                            previous anesthesia was also reviewed. The risks                            and benefits of the procedure and the sedation                            options and risks were discussed with the patient.                            All questions were answered, and informed consent                            was obtained. Prior Anticoagulants: The patient has                            taken no previous anticoagulant or antiplatelet                            agents. ASA Grade Assessment: III - A patient with                            severe systemic disease. After reviewing the risks                            and benefits, the patient was deemed in                            satisfactory condition to undergo the procedure.                           After obtaining informed consent, the colonoscope  was passed under direct vision. Throughout the                            procedure, the patient's blood pressure, pulse, and                            oxygen saturations were monitored continuously. The                            CF [id number] #[id number] was introduced through the anus                            and advanced to the the cecum, identified by                            appendiceal  orifice and ileocecal valve. The                            colonoscopy was performed without difficulty. The                            patient tolerated the procedure well. The quality                            of the bowel preparation was adequate. Scope In: 1:41:24 PM Scope Out: 2:04:35 PM Scope Withdrawal Time: 0 hours 17 minutes 37 seconds  Total Procedure Duration: 0 hours 23 minutes 11 seconds  Findings:                 Multiple small and large-mouthed diverticula were                            found in the transverse colon, ascending colon and                            cecum.                           A 4 mm polyp was found in the transverse colon. The                            polyp was sessile. The polyp was removed with a                            cold snare. Resection and retrieval were complete.                           Non-bleeding internal hemorrhoids were found during                            retroflexion. Complications:            No immediate complications. Estimated Blood Loss:     Estimated blood loss was minimal. Impression:               -  Diverticulosis in the transverse colon, in the                            ascending colon and in the cecum.                           - One 4 mm polyp in the transverse colon, removed                            with a cold snare. Resected and retrieved.                           - Non-bleeding internal hemorrhoids. Recommendation:           - Discharge patient to home (with escort).                           - It is suspected that your rectal bleeding is due                            to hemorrhoids. Would encourage high fiber diet and                            using a stool softener.                           - Await pathology results.                           - The findings and recommendations were discussed                            with the patient. [address]Alicia Blake,  07/27/2021 2:16:41 PM

## 2021-07-27 NOTE — Progress Notes (Signed)
Report given to PACU, vss 

## 2021-07-27 NOTE — Patient Instructions (Signed)
Handout given:  polyps, diverticulosis, high fiber diet Start a high fiber diet and INCREASE YOUR WATER INTAKE Await pathology results Use stool softener daily  YOU HAD AN ENDOSCOPIC PROCEDURE TODAY AT [facility]:   Refer to the procedure report that was given to you for any specific questions about what was found during the examination.  If the procedure report does not answer your questions, please call your gastroenterologist to clarify.  If you requested that your care partner not be given the details of your procedure findings, then the procedure report has been included in a sealed envelope for you to review at your convenience later.  YOU SHOULD EXPECT: Some feelings of bloating in the abdomen. Passage of more gas than usual.  Walking can help get rid of the air that was put into your GI tract during the procedure and reduce the bloating. If you had a lower endoscopy (such as a colonoscopy or flexible sigmoidoscopy) you may notice spotting of blood in your stool or on the toilet paper. If you underwent a bowel prep for your procedure, you may not have a normal bowel movement for a few days.  Please Note:  You might notice some irritation and congestion in your nose or some drainage.  This is from the oxygen used during your procedure.  There is no need for concern and it should clear up in a day or so.  SYMPTOMS TO REPORT IMMEDIATELY:  Following lower endoscopy (colonoscopy or flexible sigmoidoscopy):  Excessive amounts of blood in the stool  Significant tenderness or worsening of abdominal pains  Swelling of the abdomen that is new, acute  Fever of 100F or higher  For urgent or emergent issues, a gastroenterologist can be reached at any hour by calling [phone]. Do not use MyChart messaging for urgent concerns.   DIET:  We do recommend a small meal at first, but then you may proceed to your regular diet.  Drink plenty of fluids but you should avoid alcoholic  beverages for 24 hours.  ACTIVITY:  You should plan to take it easy for the rest of today and you should NOT DRIVE or use heavy machinery until tomorrow (because of the sedation medicines used during the test).    FOLLOW UP: Our staff will call the number listed on your records 48-72 hours following your procedure to check on you and address any questions or concerns that you may have regarding the information given to you following your procedure. If we do not reach you, we will leave a message.  We will attempt to reach you two times.  During this call, we will ask if you have developed any symptoms of COVID 19. If you develop any symptoms (ie: fever, flu-like symptoms, shortness of breath, cough etc.) before then, please call [phone].  If you test positive for Covid 19 in the 2 weeks post procedure, please call and report this information to [place].    If any biopsies were taken you will be contacted by phone or by letter within the next 1-3 weeks.  Please call us at [phone] if you have not heard about the biopsies in 3 weeks.   SIGNATURES/CONFIDENTIALITY: You and/or your care partner have signed paperwork which will be entered into your electronic medical record.  These signatures attest to the fact that that the information above on your After Visit Summary has been reviewed and is understood.  Full responsibility of the confidentiality of this discharge information lies with  you and/or your care-partner.

## 2021-07-31 ENCOUNTER — Telehealth: Payer: Self-pay

## 2021-07-31 NOTE — Telephone Encounter (Signed)
Attempted f/u call back. No answer, left VM. 

## 2021-08-01 ENCOUNTER — Encounter: Payer: Self-pay | Admitting: Internal Medicine

## 2021-11-12 ENCOUNTER — Other Ambulatory Visit: Payer: Self-pay

## 2021-11-12 ENCOUNTER — Emergency Department (HOSPITAL_COMMUNITY): Payer: Medicaid Other

## 2021-11-12 ENCOUNTER — Emergency Department (HOSPITAL_COMMUNITY)
Admission: EM | Admit: 2021-11-12 | Discharge: 2021-11-12 | Disposition: A | Discharge status: Home / Self Care | Payer: Medicaid Other | Attending: Emergency Medicine | Admitting: Emergency Medicine

## 2021-11-12 ENCOUNTER — Emergency Department (HOSPITAL_BASED_OUTPATIENT_CLINIC_OR_DEPARTMENT_OTHER)
Admission: EM | Admit: 2021-11-12 | Discharge: 2021-11-12 | Disposition: A | Discharge status: Home / Self Care | Payer: Medicaid Other | Source: Home / Self Care

## 2021-11-12 ENCOUNTER — Encounter (HOSPITAL_COMMUNITY): Payer: Self-pay

## 2021-11-12 DIAGNOSIS — Z7982 Long term (current) use of aspirin: Secondary | ICD-10-CM | POA: Insufficient documentation

## 2021-11-12 DIAGNOSIS — J441 Chronic obstructive pulmonary disease with (acute) exacerbation: Secondary | ICD-10-CM | POA: Diagnosis not present

## 2021-11-12 DIAGNOSIS — Z20822 Contact with and (suspected) exposure to covid-19: Secondary | ICD-10-CM | POA: Insufficient documentation

## 2021-11-12 DIAGNOSIS — M7989 Other specified soft tissue disorders: Secondary | ICD-10-CM

## 2021-11-12 DIAGNOSIS — R0602 Shortness of breath: Secondary | ICD-10-CM | POA: Diagnosis present

## 2021-11-12 DIAGNOSIS — Z7951 Long term (current) use of inhaled steroids: Secondary | ICD-10-CM | POA: Insufficient documentation

## 2021-11-12 LAB — TROPONIN I (HIGH SENSITIVITY): Troponin I (High Sensitivity): <2 ng/L (ref <18)

## 2021-11-12 LAB — RESP PANEL BY RT-PCR (FLU A&B, COVID) ARPGX2
Influenza A by PCR: NEGATIVE
Influenza B by PCR: NEGATIVE
SARS Coronavirus 2 by RT PCR: NEGATIVE

## 2021-11-12 LAB — CBC
HCT: 34.5 % — ABNORMAL LOW (ref 36.0–46.0)
Hemoglobin: 10.9 g/dL — ABNORMAL LOW (ref 12.0–15.0)
MCH: 29 pg (ref 26.0–34.0)
MCHC: 31.6 g/dL (ref 30.0–36.0)
MCV: 91.8 fL (ref 80.0–100.0)
Platelets: 239 10*3/uL (ref 150–400)
RBC: 3.76 MIL/uL — ABNORMAL LOW (ref 3.87–5.11)
RDW: 16.8 % — ABNORMAL HIGH (ref 11.5–15.5)
WBC: 8.5 10*3/uL (ref 4.0–10.5)
nRBC: 0 % (ref 0.0–0.2)

## 2021-11-12 LAB — BASIC METABOLIC PANEL
Anion gap: 5 (ref 5–15)
BUN: 8 mg/dL (ref 6–20)
CO2: 28 mmol/L (ref 22–32)
Calcium: 9 mg/dL (ref 8.9–10.3)
Chloride: 103 mmol/L (ref 98–111)
Creatinine, Ser: 0.77 mg/dL (ref 0.44–1.00)
GFR, Estimated: >60 mL/min (ref >60)
Glucose, Bld: 97 mg/dL (ref 70–99)
Potassium: 4 mmol/L (ref 3.5–5.1)
Sodium: 136 mmol/L (ref 135–145)

## 2021-11-12 MED ORDER — IPRATROPIUM-ALBUTEROL 0.5-2.5 (3) MG/3ML IN SOLN
3.0000 mL | Freq: Once | RESPIRATORY_TRACT | Status: AC
  Administered 2021-11-12: 3 mL via RESPIRATORY_TRACT
  Filled 2021-11-12: qty 3

## 2021-11-12 MED ORDER — BUDESONIDE-FORMOTEROL FUMARATE 80-4.5 MCG/ACT IN AERO: 2.0000 | INHALATION_SPRAY | Freq: Two times a day (BID) | RESPIRATORY_TRACT | 2 refills | Status: DC

## 2021-11-12 MED ORDER — DOXYCYCLINE HYCLATE 100 MG PO CAPS: 100.0000 mg | ORAL_CAPSULE | Freq: Two times a day (BID) | ORAL | 0 refills | Status: AC

## 2021-11-12 MED ORDER — DOXYCYCLINE HYCLATE 100 MG PO TABS
100.0000 mg | ORAL_TABLET | Freq: Once | ORAL | Status: AC
  Administered 2021-11-12: 100 mg via ORAL
  Filled 2021-11-12: qty 1

## 2021-11-12 MED ORDER — ALBUTEROL SULFATE HFA 108 (90 BASE) MCG/ACT IN AERS: 2.0000 | INHALATION_SPRAY | RESPIRATORY_TRACT | Status: DC | PRN

## 2021-11-12 MED ORDER — PREDNISONE 20 MG PO TABS
20.0000 mg | ORAL_TABLET | Freq: Once | ORAL | Status: AC
  Administered 2021-11-12: 20 mg via ORAL
  Filled 2021-11-12: qty 1

## 2021-11-12 MED ORDER — PREDNISONE 20 MG PO TABS: 20.0000 mg | ORAL_TABLET | Freq: Every day | ORAL | 0 refills | Status: AC

## 2021-11-12 NOTE — ED Notes (Signed)
TOC consulted for pt in need of neb machine for home use. CSW reached out to pt who states that she is agreeable with any DME company. CSW reached out to [place] with [employer] who states they will accept referral and get machine ordered and delivered to ED. CSW updated Attending DO of this. TOC signing off.

## 2021-11-12 NOTE — TOC Transition Note (Signed)
Transition of Care [facility]) - CM/SW Discharge Note   Patient Details  Name: Alicia Blake MRN: [id number] Date of Birth: [birth year]  [facility] CM/SW Contact:  Roseanne Kaufman, RN Phone Number: 11/12/2021, 3:23 PM   Clinical Narrative:    RNCM spoke with patient at bedside. Patient is agreeable to receive nebulizer via [employer]. Patient is able to afford medications. Patient has used nebulizer in the past and is educated on use. Uses [facility] on [address]. No additional TOC needs.    Patient Goals and CMS Choice  Go home      Discharge Placement     Discharge Plan and Services  Home with nebulizer   Social Determinants of Health (SDOH) Interventions     Readmission Risk Interventions Readmission Risk Prevention Plan 04/26/2020  Transportation Screening Complete  PCP or Specialist Appt within 5-7 Days Complete  Home Care Screening Complete  Medication Review (RN CM) Complete  Some recent data might be hidden

## 2021-11-12 NOTE — ED Triage Notes (Signed)
Patient c/o cough, chest pain, sore throat, and SOB for approx 2 weeks.

## 2021-11-12 NOTE — ED Notes (Signed)
Pt states understanding of dc instructions, importance of follow up, and prescriptions. Pt denies questions or concerns upon dc. Pt declined wheelchair assistance upon dc. Pt ambulated out of ed w/ steady gait. No belongings left in room upon dc.  

## 2021-11-12 NOTE — ED Provider Triage Note (Signed)
Emergency Medicine Provider Triage Evaluation Note  Alicia Blake , a 56 y.o. female  was evaluated in triage.  Pt complains of 2-week duration of shortness of breath, couple episodes of sharp shooting chest pain, palpitations, sore throat.  On exam patient has unilateral right lower extremity swelling.  Patient states this is not usual for her and is unsure how long this has been swelling.  She does have history of COPD and does report intermittent wheezing.  Denies personal history of CAD, or arrhythmia.  Review of Systems  Positive: See above Negative: See above  Physical Exam  BP (!) 197/116 (BP Location: Left Arm)    Pulse 93    Temp 97.9 F (36.6 C) (Oral)    Resp 20    Ht 5\' 6"  (1.676 m)    Wt 122.5 kg    LMP 12/09/2015 Comment: very irregular   SpO2 98%    BMI 43.58 kg/m  Gen:   Awake, no distress   Resp:  Normal effort  MSK:   Moves extremities without difficulty  Other:  Right lower extremity swelling compared to left.  Medical Decision Making  Medically screening exam initiated at 12:52 PM.  Appropriate orders placed.  TISHANA CLINKENBEARD was informed that the remainder of the evaluation will be completed by another provider, this initial triage assessment does not replace that evaluation, and the importance of remaining in the ED until their evaluation is complete.     Evlyn Courier, PA-C 11/12/21 1254

## 2021-11-12 NOTE — ED Provider Notes (Signed)
[facility] DEPT Provider Note   CSN: [id number] Arrival date & time: 11/12/21  1145     History  Chief Complaint  Patient presents with   Shortness of Breath   Sore Throat   Chest Pain   Cough    Alicia Blake is a 56 y.o. female.  Patient is a 56 yo female presenting for sob. Pt admits to sob associated with cough and wheezing x 1.5 weeks. Denies fever, chills, nasal congestion, rhinhhoriea, or sore throat. Denies chest pain.  The history is provided by the patient. No language interpreter was used.  Shortness of Breath Associated symptoms: cough and wheezing   Associated symptoms: no abdominal pain, no chest pain, no ear pain, no fever, no rash, no sore throat and no vomiting   Sore Throat Associated symptoms include shortness of breath. Pertinent negatives include no chest pain and no abdominal pain.  Chest Pain Associated symptoms: cough and shortness of breath   Associated symptoms: no abdominal pain, no back pain, no fever, no palpitations and no vomiting   Cough Associated symptoms: shortness of breath and wheezing   Associated symptoms: no chest pain, no chills, no ear pain, no fever, no rash and no sore throat       Home Medications Prior to Admission medications   Medication Sig Start Date End Date Taking? Authorizing Provider  ABILIFY MAINTENA 400 MG PRSY prefilled syringe SMARTSIG:1 Injection IM Once a Month 06/13/21   [provider]  acetaminophen (TYLENOL) 500 MG tablet Take 2 tablets (1,000 mg total) by mouth every 8 (eight) hours as needed for mild pain or moderate pain. 07/24/21   Britt Bottom, PA-C  albuterol (PROVENTIL) (2.5 MG/3ML) 0.083% nebulizer solution Take 2.5 mg by nebulization every 6 (six) hours as needed for wheezing or shortness of breath. Patient not taking: Reported on 07/27/2021    [provider]  albuterol (VENTOLIN HFA) 108 (90 Base) MCG/ACT inhaler Inhale 2 puffs into the lungs every 6  (six) hours as needed for wheezing. Patient not taking: Reported on 07/27/2021 10/11/19   Chase Picket, MD  aspirin EC 81 MG tablet Take 1 tablet (81 mg total) by mouth 2 (two) times daily. For DVT prophylaxis for 30 days after surgery. 07/24/21   Britt Bottom, PA-C  atomoxetine (STRATTERA) 40 MG capsule Take 40 mg by mouth daily. 09/18/19   Elbert Ewings, FNP  benztropine (COGENTIN) 0.5 MG tablet Take 0.5 mg by mouth 2 (two) times daily. 07/12/21   [provider]  Calcium Carb-Cholecalciferol (CALCIUM 600/VITAMIN D3 PO) Take 1 tablet by mouth daily.    [provider]  dicyclomine (BENTYL) 20 MG tablet Take 20 mg by mouth 4 (four) times daily. Patient not taking: Reported on 07/27/2021 02/25/20   [provider]  esomeprazole (NEXIUM) 40 MG capsule Take 40 mg by mouth daily. 09/22/19   [provider]  fluticasone (FLONASE) 50 MCG/ACT nasal spray Place 1 spray into both nostrils daily. Patient not taking: Reported on 07/27/2021    [provider]  Fluticasone-Salmeterol (ADVAIR) 100-50 MCG/DOSE AEPB Inhale 1 puff into the lungs at bedtime.    [provider]  furosemide (LASIX) 20 MG tablet Take 20-60 mg by mouth daily as needed for fluid or edema.    [provider]  HYDROcodone-acetaminophen (NORCO) 10-325 MG tablet Take 1 tablet by mouth every 6 (six) hours as needed for severe pain. Do not take more than 6 tablets in a 24 hour  period. 07/24/21   Britt Bottom, PA-C  hydrOXYzine (VISTARIL) 25 MG capsule Take 25 mg by mouth 3 (three) times daily. 06/16/19   [provider]  ibuprofen (ADVIL) 800 MG tablet Take 800 mg by mouth 2 (two) times daily as needed. Patient not taking: Reported on 07/27/2021 03/15/21   [provider]  lithium carbonate 300 MG capsule Take 600 mg by mouth 2 (two) times daily with a meal. 09/18/19   [provider]  losartan (COZAAR) 25 MG tablet Take 25 mg by mouth daily. 09/18/19    Trey Sailors, PA  Multiple Vitamin (MULTIVITAMIN) tablet Take 1 tablet by mouth daily. Patient not taking: Reported on 07/27/2021    [provider]  ondansetron (ZOFRAN ODT) 4 MG disintegrating tablet Take 1 tablet (4 mg total) by mouth every 8 (eight) hours as needed for nausea or vomiting. 07/24/21   Britt Bottom, PA-C  PARoxetine (PAXIL) 40 MG tablet Take 40 mg by mouth daily.    [provider]  QUEtiapine (SEROQUEL) 200 MG tablet Take 200 mg by mouth at bedtime.     [provider]  tiZANidine (ZANAFLEX) 4 MG tablet Take 4 mg by mouth every 8 (eight) hours as needed for muscle spasms.  Patient not taking: Reported on 07/27/2021    [provider]  vitamin B-12 (CYANOCOBALAMIN) 500 MCG tablet Take 500 mcg by mouth daily. Patient not taking: Reported on 07/27/2021    [provider]      Allergies    Risperidone and related, Buspirone, Meloxicam, Prednisone, and Gabapentin    Review of Systems   Review of Systems  Constitutional:  Negative for chills and fever.  HENT:  Negative for ear pain and sore throat.   Eyes:  Negative for pain and visual disturbance.  Respiratory:  Positive for cough, chest tightness, shortness of breath and wheezing.   Cardiovascular:  Negative for chest pain and palpitations.  Gastrointestinal:  Negative for abdominal pain and vomiting.  Genitourinary:  Negative for dysuria and hematuria.  Musculoskeletal:  Negative for arthralgias and back pain.  Skin:  Negative for color change and rash.  Neurological:  Negative for seizures and syncope.  All other systems reviewed and are negative.  Physical Exam Updated Vital Signs BP (!) 146/78    Pulse 81    Temp 98.3 F (36.8 C)    Resp 18    Ht 5\' 6"  (1.676 m)    Wt 122.5 kg    LMP 12/09/2015 Comment: very irregular   SpO2 100%    BMI 43.58 kg/m  Physical Exam Vitals and nursing note reviewed.  Constitutional:      General: She is not in acute distress.     Appearance: She is well-developed.  HENT:     Head: Normocephalic and atraumatic.  Eyes:     Conjunctiva/sclera: Conjunctivae normal.  Cardiovascular:     Rate and Rhythm: Normal rate and regular rhythm.     Heart sounds: No murmur heard. Pulmonary:     Effort: Pulmonary effort is normal. No respiratory distress.     Breath sounds: Examination of the right-upper field reveals wheezing. Examination of the left-upper field reveals wheezing. Examination of the right-middle field reveals wheezing. Examination of the left-middle field reveals wheezing. Examination of the right-lower field reveals wheezing. Examination of the left-lower field reveals wheezing. Wheezing present.  Abdominal:     Palpations: Abdomen is soft.     Tenderness: There is no abdominal tenderness.  Musculoskeletal:  General: No swelling.     Cervical back: Neck supple.  Skin:    General: Skin is warm and dry.     Capillary Refill: Capillary refill takes less than 2 seconds.  Neurological:     Mental Status: She is alert.  Psychiatric:        Mood and Affect: Mood normal.    ED Results / Procedures / Treatments   Labs (all labs ordered are listed, but only abnormal results are displayed) Labs Reviewed  CBC - Abnormal; Notable for the following components:      Result Value   RBC 3.76 (*)    Hemoglobin 10.9 (*)    HCT 34.5 (*)    RDW 16.8 (*)    All other components within normal limits  RESP PANEL BY RT-PCR (FLU A&B, COVID) ARPGX2  BASIC METABOLIC PANEL  TROPONIN I (HIGH SENSITIVITY)  TROPONIN I (HIGH SENSITIVITY)    EKG EKG Interpretation  Date/Time:  Monday November 12 2021 11:54:54 EST Ventricular Rate:  91 PR Interval:    QRS Duration: 101 QT Interval:  350 QTC Calculation: 431 R Axis:   81 Text Interpretation: Sinus rhythm Confirmed by Campbell Stall ([id number]) on 07/11/7828 4:27:44 PM  Radiology DG Chest 2 View  Result Date: 11/12/2021 CLINICAL DATA:  Shortness of breath and cough for 2  weeks. EXAM: CHEST - 2 VIEW COMPARISON:  July 16, 2021 FINDINGS: The heart size and mediastinal contours are within normal limits. Both lungs are clear. The visualized skeletal structures are unremarkable. IMPRESSION: No active cardiopulmonary disease. Electronically Signed   By: Abelardo Diesel M.D.   On: 11/12/2021 12:22   VAS [place] LOWER EXTREMITY VENOUS (DVT) (7a-7p)  Result Date: 11/12/2021  Lower Venous DVT Study Patient Name:  JULES VIDOVICH  Date of Exam:   11/12/2021 Medical Rec #: [id number]       Accession #:    [id number] Date of Birth: 12/07/1965        Patient Gender: F Patient Age:   32 years Exam Location:  [facility] Procedure:      VAS [place] LOWER EXTREMITY VENOUS (DVT) Referring Phys: AMJAD ALI --------------------------------------------------------------------------------  Indications: Swelling.  Risk Factors: None identified. Limitations: Body habitus and poor ultrasound/tissue interface. Comparison Study: No prior studies. Performing Technologist: Oliver Hum RVT  Examination Guidelines: A complete evaluation includes B-mode imaging, spectral Doppler, color Doppler, and power Doppler as needed of all accessible portions of each vessel. Bilateral testing is considered an integral part of a complete examination. Limited examinations for reoccurring indications may be performed as noted. The reflux portion of the exam is performed with the patient in reverse Trendelenburg.  +---------+---------------+---------+-----------+----------+--------------+  RIGHT     Compressibility Phasicity Spontaneity Properties Thrombus Aging  +---------+---------------+---------+-----------+----------+--------------+  CFV       Full            Yes       Yes                                    +---------+---------------+---------+-----------+----------+--------------+  SFJ       Full                                                              +---------+---------------+---------+-----------+----------+--------------+  FV Prox   Full                                                             +---------+---------------+---------+-----------+----------+--------------+  FV Mid    Full                                                             +---------+---------------+---------+-----------+----------+--------------+  FV Distal Full                                                             +---------+---------------+---------+-----------+----------+--------------+  PFV       Full                                                             +---------+---------------+---------+-----------+----------+--------------+  POP       Full            Yes       Yes                                    +---------+---------------+---------+-----------+----------+--------------+  PTV       Full                                                             +---------+---------------+---------+-----------+----------+--------------+  PERO      Full                                                             +---------+---------------+---------+-----------+----------+--------------+   +----+---------------+---------+-----------+----------+--------------+  LEFT Compressibility Phasicity Spontaneity Properties Thrombus Aging  +----+---------------+---------+-----------+----------+--------------+  CFV  Full            Yes       Yes                                    +----+---------------+---------+-----------+----------+--------------+    Summary: RIGHT: - There is no evidence of deep vein thrombosis in the lower extremity.  - No cystic structure found in the popliteal fossa.  LEFT: - No evidence of common femoral vein obstruction.  *See table(s) above for measurements and observations.    Preliminary     Procedures Procedures  Medications Ordered in ED Medications  albuterol (VENTOLIN HFA) 108 (90 Base) MCG/ACT inhaler 2 puff (has no administration in time range)     ED Course/ Medical Decision Making/ A&P                           Medical Decision Making  2:33 PM 56 yo. Female presenting for sob, wheezing, and cough. Pt is Aox3, no acute distress, afebrile, with stable vitals. On exam pt has wheezing in all lung fields. Coughing on exam. No hypoxia. Stable CXR. No influenza/covid.   Symptoms likely secondary to COPD exacerbation. Doxycycline, prednisone, and duoneb treatment given in ED. Symbicort, doxycyline, and prednisone sent to pharmacy. Nebulizer requested by patient and set up by social care worker to be delivered at home.   Stable ECG with no ST segment elevation or depression. Troponin wnl. Doubt cardiac etiology.  Patient in no distress and overall condition improved here in the ED. Detailed discussions were had with the patient regarding current findings, and need for close f/u with PCP or on call doctor. The patient has been instructed to return immediately if the symptoms worsen in any way for re-evaluation. Patient verbalized understanding and is in agreement with current care plan. All questions answered prior to discharge.         Final Clinical Impression(s) / ED Diagnoses Final diagnoses:  COPD exacerbation [facility])    Rx / DC Orders ED Discharge Orders     None         Lianne Cure, DO 04/59/97 1628

## 2021-11-12 NOTE — Progress Notes (Signed)
Right lower extremity venous duplex has been completed. Preliminary results can be found in CV Proc through chart review.  Results were given to Dr. Matilde Sprang.  11/12/21 1:18 PM Alicia Blake RVT

## 2021-11-27 ENCOUNTER — Ambulatory Visit (HOSPITAL_COMMUNITY)
Admission: EM | Admit: 2021-11-27 | Discharge: 2021-11-27 | Disposition: A | Discharge status: Home / Self Care | Payer: Medicaid Other | Attending: Urgent Care | Admitting: Urgent Care

## 2021-11-27 ENCOUNTER — Encounter (HOSPITAL_COMMUNITY): Payer: Self-pay | Admitting: Emergency Medicine

## 2021-11-27 ENCOUNTER — Other Ambulatory Visit: Payer: Self-pay

## 2021-11-27 DIAGNOSIS — J441 Chronic obstructive pulmonary disease with (acute) exacerbation: Secondary | ICD-10-CM | POA: Diagnosis not present

## 2021-11-27 MED ORDER — ALBUTEROL SULFATE (2.5 MG/3ML) 0.083% IN NEBU
2.5000 mg | INHALATION_SOLUTION | Freq: Once | RESPIRATORY_TRACT | Status: AC
  Administered 2021-11-27: 2.5 mg via RESPIRATORY_TRACT

## 2021-11-27 MED ORDER — ALBUTEROL SULFATE (2.5 MG/3ML) 0.083% IN NEBU
INHALATION_SOLUTION | RESPIRATORY_TRACT | Status: AC
  Filled 2021-11-27: qty 3

## 2021-11-27 MED ORDER — AMOXICILLIN-POT CLAVULANATE 875-125 MG PO TABS: 1.0000 | ORAL_TABLET | Freq: Two times a day (BID) | ORAL | 0 refills | Status: DC

## 2021-11-27 MED ORDER — BUDESONIDE-FORMOTEROL FUMARATE 160-4.5 MCG/ACT IN AERO
2.0000 | INHALATION_SPRAY | Freq: Two times a day (BID) | RESPIRATORY_TRACT | 0 refills | Status: AC
Start: 2021-11-27 — End: ?

## 2021-11-27 MED ORDER — METHYLPREDNISOLONE 4 MG PO TBPK
ORAL_TABLET | ORAL | 0 refills | Status: DC
Start: 2021-11-27 — End: 2021-12-17

## 2021-11-27 NOTE — ED Triage Notes (Signed)
Pt reports for about week having cough, congestion and thinks has infection in her chest causing SOB esp with exertion. Reports having little ear pain and hearing difficulty.

## 2021-11-27 NOTE — ED Provider Notes (Signed)
[facility]    CSN: [id number] Arrival date & time: 11/27/21  1453      History   Chief Complaint Chief Complaint  Patient presents with   Shortness of Breath   Cough   Nasal Congestion    HPI Alicia Blake is a 56 y.o. female.   Pleasant 56 year old female with a known history of COPD and smoking presents today with concerns of worsening shortness of breath and cough for the past week.  She was seen in the emergency room on 11/12/2021 in which she had a significant work-up including EKG, chest x-ray, venous Doppler ultrasound, lab work, all of which was unremarkable.  Patient was given a DuoNeb in the emergency room, doxycycline, and prednisone in the ER.  She was also discharged home with a week of Doxy, prednisone, and a prescription for a nebulizer machine.  Patient states she got the nebulizer machine and over the past week has only used it 2 times.  She states she has medication for just has not used it.  She reports completing all of the antibiotics and steroids prescribed at the emergency room.  She states she started feeling slightly better, but it worsened after completing the therapy 1 week ago.  Pt states" I know my body and I can tell I have an infection in my chest."  She admits to continued smoking, although she states she did not smoke yesterday because she "felt so bad".  She does have a prescription for Advair 100-50 which patient states she takes once a day.  He denies any fever, chest pain, palpitations.  She denies ever being hospitalized for asthma or COPD in the past.  She states she is unable to get the phlegm up.   Shortness of Breath Associated symptoms: cough   Cough Associated symptoms: shortness of breath    Past Medical History:  Diagnosis Date   Asthma    Bipolar affect, depressed (HCC)    Bronchitis    Chronic back pain    Closed left ankle fracture    COPD (chronic obstructive pulmonary disease) (HCC)    DJD (degenerative joint disease)     BACK   GERD (gastroesophageal reflux disease)    Hypertension    Pneumonia    Pre-diabetes    dr entered diabetes without complications- no meds does not test sugar at home   PTSD (post-traumatic stress disorder)    Scoliosis    Seizures ([place])    "when drinking" last yrs ago   Shortness of breath dyspnea    exersion   Substance abuse ([place])    crack cocaine, 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, none since   Thyroid disease    UTI (lower urinary tract infection)     Patient Active Problem List   Diagnosis Date Noted   Lumbar foraminal stenosis 04/21/2020   Lumbar stenosis with neurogenic claudication 06/18/2017   Tobacco abuse 04/19/2016   Primary osteoarthritis of left hip 04/18/2016   Opioid use disorder, mild, abuse ([place]) 09/08/2015   Schizoaffective disorder, bipolar type ([place]) 09/07/2015   Chronic pain syndrome 09/07/2015   Alcohol use disorder, moderate, in sustained remission ([place]) 09/07/2015   Cocaine use disorder, moderate, in sustained remission ([place]) 09/07/2015   Cannabis use disorder, moderate, in sustained remission ([place]) 09/07/2015   DDD (degenerative disc disease), lumbar 09/07/2015   Hx of fracture of ankle 09/07/2015   Symptomatic Fibroids 08/09/2013   Breast lump on left side at 9 o'clock position 06/29/2013   Breast discharge 06/29/2013  Past Surgical History:  Procedure Laterality Date   2 LEFT TOES SURGERY  12/2014   finished only on right side   BACK SURGERY     BALLOON DILATION N/A 12/14/2015   Procedure: BALLOON DILATION;  Surgeon: Teena Irani, MD;  Location: [facility];  Service: Endoscopy;  Laterality: N/A;   COLONOSCOPY WITH PROPOFOL N/A 12/14/2015   Procedure: COLONOSCOPY WITH PROPOFOL;  Surgeon: Teena Irani, MD;  Location: [facility];  Service: Endoscopy;  Laterality: N/A;   ESOPHAGOGASTRODUODENOSCOPY (EGD) WITH PROPOFOL N/A 12/14/2015   Procedure: ESOPHAGOGASTRODUODENOSCOPY (EGD) WITH PROPOFOL;  Surgeon: Teena Irani, MD;  Location: [facility];   Service: Endoscopy;  Laterality: N/A;   JOINT REPLACEMENT     left hip   LEFT ANKLE SURGERY  MARCH 2016   LUMBAR LAMINECTOMY/DECOMPRESSION MICRODISCECTOMY N/A 06/18/2017   Procedure: LAMINECTOMY AND FORAMINOTOMY LUMBAR FOUR - LUMBAR FIVE;  Surgeon: Ashok Pall, MD;  Location: [facility];  Service: Neurosurgery;  Laterality: N/A;  LAMINECTOMY AND FORAMINOTOMY LUMBAR 4- LUMBAR 5   LUMBAR WOUND DEBRIDEMENT N/A 03/10/2020   Procedure: Lumbar Wound Debridement;  Surgeon: Ashok Pall, MD;  Location: [facility];  Service: Neurosurgery;  Laterality: N/A;  posterior   NECK SURGERY  AS TEENAGER   STITCHES TO NECK    ORIF ANKLE FRACTURE Left 07/24/2021   Procedure: OPEN REDUCTION INTERNAL FIXATION (ORIF) ANKLE FRACTURE;  Surgeon: Renette Butters, MD;  Location: [facility];  Service: Orthopedics;  Laterality: Left;   SURGERY FOR CUT ON STOMACH  TEENAGER   TOTAL HIP ARTHROPLASTY Left 05/14/2016   Procedure: TOTAL HIP ARTHROPLASTY ANTERIOR APPROACH;  Surgeon: Renette Butters, MD;  Location: [facility];  Service: Orthopedics;  Laterality: Left;    OB History     Gravida  1   Para  1   Term  1   Preterm      AB      Living  1      SAB      IAB      Ectopic      Multiple      Live Births               Home Medications    Prior to Admission medications   Medication Sig Start Date End Date Taking? Authorizing Provider  amoxicillin-clavulanate (AUGMENTIN) 875-125 MG tablet Take 1 tablet by mouth every 12 (twelve) hours. 11/27/21  Yes Thane Age L, PA  budesonide-formoterol (SYMBICORT) 160-4.5 MCG/ACT inhaler Inhale 2 puffs into the lungs in the morning and at bedtime. 11/27/21  Yes Kalee Broxton L, PA  methylPREDNISolone (MEDROL DOSEPAK) 4 MG TBPK tablet Take one dose pack per package instructions 11/27/21  Yes Makenleigh Crownover L, PA  ABILIFY MAINTENA 400 MG PRSY prefilled syringe SMARTSIG:1 Injection IM Once a Month 06/13/21   [provider]  acetaminophen (TYLENOL)  500 MG tablet Take 2 tablets (1,000 mg total) by mouth every 8 (eight) hours as needed for mild pain or moderate pain. 07/24/21   Britt Bottom, PA-C  albuterol (PROVENTIL) (2.5 MG/3ML) 0.083% nebulizer solution Take 2.5 mg by nebulization every 6 (six) hours as needed for wheezing or shortness of breath. Patient not taking: Reported on 07/27/2021    [provider]  albuterol (VENTOLIN HFA) 108 (90 Base) MCG/ACT inhaler Inhale 2 puffs into the lungs every 6 (six) hours as needed for wheezing. Patient not taking: Reported on 07/27/2021 10/11/19   Chase Picket, MD  aspirin EC 81 MG tablet Take 1 tablet (81  mg total) by mouth 2 (two) times daily. For DVT prophylaxis for 30 days after surgery. 07/24/21   Britt Bottom, PA-C  atomoxetine (STRATTERA) 40 MG capsule Take 40 mg by mouth daily. 09/18/19   Elbert Ewings, FNP  benztropine (COGENTIN) 0.5 MG tablet Take 0.5 mg by mouth 2 (two) times daily. 07/12/21   [provider]  Calcium Carb-Cholecalciferol (CALCIUM 600/VITAMIN D3 PO) Take 1 tablet by mouth daily.    [provider]  dicyclomine (BENTYL) 20 MG tablet Take 20 mg by mouth 4 (four) times daily. Patient not taking: Reported on 07/27/2021 02/25/20   [provider]  esomeprazole (NEXIUM) 40 MG capsule Take 40 mg by mouth daily. 09/22/19   [provider]  fluticasone (FLONASE) 50 MCG/ACT nasal spray Place 1 spray into both nostrils daily. Patient not taking: Reported on 07/27/2021    [provider]  furosemide (LASIX) 20 MG tablet Take 20-60 mg by mouth daily as needed for fluid or edema.    [provider]  HYDROcodone-acetaminophen (NORCO) 10-325 MG tablet Take 1 tablet by mouth every 6 (six) hours as needed for severe pain. Do not take more than 6 tablets in a 24 hour period. 07/24/21   Britt Bottom, PA-C  hydrOXYzine (VISTARIL) 25 MG capsule Take 25 mg by mouth 3 (three) times daily. 06/16/19   [provider]   ibuprofen (ADVIL) 800 MG tablet Take 800 mg by mouth 2 (two) times daily as needed. Patient not taking: Reported on 07/27/2021 03/15/21   [provider]  lithium carbonate 300 MG capsule Take 600 mg by mouth 2 (two) times daily with a meal. 09/18/19   [provider]  losartan (COZAAR) 25 MG tablet Take 25 mg by mouth daily. 09/18/19   Trey Sailors, PA  Multiple Vitamin (MULTIVITAMIN) tablet Take 1 tablet by mouth daily. Patient not taking: Reported on 07/27/2021    [provider]  ondansetron (ZOFRAN ODT) 4 MG disintegrating tablet Take 1 tablet (4 mg total) by mouth every 8 (eight) hours as needed for nausea or vomiting. 07/24/21   Britt Bottom, PA-C  PARoxetine (PAXIL) 40 MG tablet Take 40 mg by mouth daily.    [provider]  QUEtiapine (SEROQUEL) 200 MG tablet Take 200 mg by mouth at bedtime.     [provider]  tiZANidine (ZANAFLEX) 4 MG tablet Take 4 mg by mouth every 8 (eight) hours as needed for muscle spasms.  Patient not taking: Reported on 07/27/2021    [provider]  vitamin B-12 (CYANOCOBALAMIN) 500 MCG tablet Take 500 mcg by mouth daily. Patient not taking: Reported on 07/27/2021    [provider]    Family History Family History  Problem Relation Age of Onset   Hypertension Mother    Hypertension Maternal Grandmother    Hypertension Paternal Grandmother    Diabetes Paternal Grandmother    Hypertension Paternal Grandfather    Diabetes Paternal Grandfather    Alcohol abuse Paternal Aunt    Mental illness Cousin    Heart attack Cousin    Heart attack Maternal Uncle     Social History Social History   Tobacco Use   Smoking status: Every Day    Packs/day: 0.50    Years: 26.00    Pack years: 13.00    Types: Cigarettes   Smokeless tobacco: Never  Vaping Use   Vaping Use: Never used  Substance Use Topics   Alcohol use: No    Comment:  quit date 01/17/2013   Drug use: No    Types: "Crack"  cocaine, Heroin    Comment: none since 03-2020     Allergies   Risperidone and related, Buspirone, Meloxicam, Prednisone, and Gabapentin   Review of Systems Review of Systems  Respiratory:  Positive for cough and shortness of breath.   As per hpi  Physical Exam Triage Vital Signs ED Triage Vitals  Enc Vitals Group     BP 11/27/21 1639 (!) 150/80     Pulse Rate 11/27/21 1639 (!) 103     Resp 11/27/21 1639 (!) 22     Temp 11/27/21 1639 98.7 F (37.1 C)     Temp Source 11/27/21 1639 Oral     SpO2 11/27/21 1639 97 %     Weight --      Height --      Head Circumference --      Peak Flow --      Pain Score 11/27/21 1636 9     Pain Loc --      Pain Edu? --      Excl. in [place]? --    No data found.  Updated Vital Signs BP (!) 150/80 (BP Location: Right Arm)    Pulse (!) 103    Temp 98.7 F (37.1 C) (Oral)    Resp (!) 22    LMP 12/09/2015 Comment: very irregular   SpO2 99%   Visual Acuity Right Eye Distance:   Left Eye Distance:   Bilateral Distance:    Right Eye Near:   Left Eye Near:    Bilateral Near:     Physical Exam Vitals and nursing note reviewed.  Constitutional:      General: She is not in acute distress.    Appearance: She is well-developed. She is obese. She is not ill-appearing, toxic-appearing or diaphoretic.  HENT:     Head: Normocephalic and atraumatic.     Mouth/Throat:     Mouth: Mucous membranes are moist.     Pharynx: Oropharynx is clear. No pharyngeal swelling or oropharyngeal exudate.  Eyes:     Extraocular Movements: Extraocular movements intact.     Pupils: Pupils are equal, round, and reactive to light.  Neck:     Thyroid: No thyromegaly.  Cardiovascular:     Rate and Rhythm: Regular rhythm. Tachycardia present. No extrasystoles are present.    Pulses: Normal pulses. No decreased pulses.     Heart sounds: No murmur heard. Pulmonary:     Effort: Pulmonary effort is normal. No accessory muscle usage or respiratory distress.     Breath  sounds: No stridor. Decreased breath sounds (generalized) and wheezing (generalized) present. No rhonchi or rales.  Chest:     Chest wall: No mass, deformity, tenderness, crepitus or edema. There is no dullness to percussion.  Abdominal:     Palpations: Abdomen is soft.  Musculoskeletal:     Cervical back: Normal range of motion and neck supple.     Right lower leg: No tenderness. No edema.     Left lower leg: No tenderness. No edema.  Lymphadenopathy:     Cervical: No cervical adenopathy.  Skin:    General: Skin is warm.     Findings: No ecchymosis or erythema.     Nails: There is no clubbing.  Neurological:     General: No focal deficit present.     Mental Status: She is alert and oriented to person, place, and time.     Motor: No  weakness.  Psychiatric:        Mood and Affect: Mood normal.        Behavior: Behavior normal.     UC Treatments / Results  Labs (all labs ordered are listed, but only abnormal results are displayed) Labs Reviewed - No data to display  EKG   Radiology No results found.  Procedures Procedures (including critical care time)  Medications Ordered in UC Medications  albuterol (PROVENTIL) (2.5 MG/3ML) 0.083% nebulizer solution 2.5 mg (2.5 mg Nebulization Given 11/27/21 1700)    Initial Impression / Assessment and Plan / UC Course  I have reviewed the triage vital signs and the nursing notes.  Pertinent labs & imaging results that were available during my care of the patient were reviewed by me and considered in my medical decision making (see chart for details).     COPD exacerbation -nebulizer provided in office.  Patient admitted to improvement in shortness of breath after treatment.  Patient has a nebulizer machine and solution at home, has not been using.  Recommended patient use this every 6 hours around-the-clock for the next 72 hours.  I have increased patient's Symbicort from 80 to 160.  We will start Medrol, patient recently completed  a prednisone pack.  Prednisone is listed as an allergy, however patient states she has taken and tolerated it without any adverse reactions.  Antibiotic called in, however I have requested patient to withhold from starting it until 3 days from now to see if the Medrol and neb treatments will open her up first.  She just completed Doxy.  And of course, smoking cessation strongly encouraged.  Final Clinical Impressions(s) / UC Diagnoses   Final diagnoses:  COPD exacerbation [facility])     Discharge Instructions      Start taking your home nebulizer solution every 6 hours around-the-clock for the next 72 hours. Start taking your oral prednisone once daily tomorrow morning. Stop your current Advair and symbicort, increase it to the one prescribed today.  Please use it two puffs twice daily and rinse your mouth out with water after each use. Only fill and start taking the antibiotic if your symptoms worsen over the next 3 days. Follow-up with your primary care physician should your symptoms continue. Please stop smoking.     ED Prescriptions     Medication Sig Dispense Auth. Provider   methylPREDNISolone (MEDROL DOSEPAK) 4 MG TBPK tablet Take one dose pack per package instructions 21 tablet Ailin Rochford L, PA   amoxicillin-clavulanate (AUGMENTIN) 875-125 MG tablet Take 1 tablet by mouth every 12 (twelve) hours. 14 tablet Arion Shankles L, PA   budesonide-formoterol (SYMBICORT) 160-4.5 MCG/ACT inhaler Inhale 2 puffs into the lungs in the morning and at bedtime. 1 each Chaney Malling, PA      PDMP not reviewed this encounter.   Chaney Malling, [place] 11/27/21 2146

## 2021-11-27 NOTE — Discharge Instructions (Addendum)
Start taking your home nebulizer solution every 6 hours around-the-clock for the next 72 hours. Start taking your oral prednisone once daily tomorrow morning. Stop your current Advair and symbicort, increase it to the one prescribed today.  Please use it two puffs twice daily and rinse your mouth out with water after each use. Only fill and start taking the antibiotic if your symptoms worsen over the next 3 days. Follow-up with your primary care physician should your symptoms continue. Please stop smoking.

## 2021-12-17 ENCOUNTER — Other Ambulatory Visit: Payer: Self-pay

## 2021-12-17 ENCOUNTER — Ambulatory Visit (INDEPENDENT_AMBULATORY_CARE_PROVIDER_SITE_OTHER): Payer: Medicaid Other

## 2021-12-17 ENCOUNTER — Encounter (HOSPITAL_COMMUNITY): Payer: Self-pay

## 2021-12-17 ENCOUNTER — Ambulatory Visit (HOSPITAL_COMMUNITY)
Admission: EM | Admit: 2021-12-17 | Discharge: 2021-12-17 | Disposition: A | Discharge status: Home / Self Care | Payer: Medicaid Other | Attending: Physician Assistant | Admitting: Physician Assistant

## 2021-12-17 DIAGNOSIS — R051 Acute cough: Secondary | ICD-10-CM | POA: Diagnosis present

## 2021-12-17 DIAGNOSIS — J441 Chronic obstructive pulmonary disease with (acute) exacerbation: Secondary | ICD-10-CM | POA: Insufficient documentation

## 2021-12-17 DIAGNOSIS — Z7951 Long term (current) use of inhaled steroids: Secondary | ICD-10-CM | POA: Insufficient documentation

## 2021-12-17 DIAGNOSIS — R0602 Shortness of breath: Secondary | ICD-10-CM

## 2021-12-17 DIAGNOSIS — Z20822 Contact with and (suspected) exposure to covid-19: Secondary | ICD-10-CM | POA: Insufficient documentation

## 2021-12-17 DIAGNOSIS — R059 Cough, unspecified: Secondary | ICD-10-CM | POA: Diagnosis not present

## 2021-12-17 DIAGNOSIS — F17210 Nicotine dependence, cigarettes, uncomplicated: Secondary | ICD-10-CM | POA: Insufficient documentation

## 2021-12-17 LAB — POC INFLUENZA A AND B ANTIGEN (URGENT CARE ONLY)
INFLUENZA A ANTIGEN, POC: NEGATIVE
INFLUENZA B ANTIGEN, POC: NEGATIVE

## 2021-12-17 MED ORDER — IPRATROPIUM-ALBUTEROL 0.5-2.5 (3) MG/3ML IN SOLN
3.0000 mL | Freq: Once | RESPIRATORY_TRACT | Status: AC
  Administered 2021-12-17: 3 mL via RESPIRATORY_TRACT

## 2021-12-17 MED ORDER — DOXYCYCLINE HYCLATE 100 MG PO CAPS: 100.0000 mg | ORAL_CAPSULE | Freq: Two times a day (BID) | ORAL | 0 refills | Status: DC

## 2021-12-17 MED ORDER — ALBUTEROL SULFATE HFA 108 (90 BASE) MCG/ACT IN AERS: 2.0000 | INHALATION_SPRAY | Freq: Four times a day (QID) | RESPIRATORY_TRACT | 0 refills | Status: AC | PRN

## 2021-12-17 MED ORDER — METHYLPREDNISOLONE SODIUM SUCC 125 MG IJ SOLR
80.0000 mg | Freq: Once | INTRAMUSCULAR | Status: AC
  Administered 2021-12-17: 80 mg via INTRAMUSCULAR

## 2021-12-17 MED ORDER — IPRATROPIUM-ALBUTEROL 0.5-2.5 (3) MG/3ML IN SOLN
RESPIRATORY_TRACT | Status: AC
  Filled 2021-12-17: qty 3

## 2021-12-17 MED ORDER — ALBUTEROL SULFATE (2.5 MG/3ML) 0.083% IN NEBU: 2.5000 mg | INHALATION_SOLUTION | Freq: Four times a day (QID) | RESPIRATORY_TRACT | 1 refills | Status: AC | PRN

## 2021-12-17 MED ORDER — METHYLPREDNISOLONE SODIUM SUCC 125 MG IJ SOLR
INTRAMUSCULAR | Status: AC
  Filled 2021-12-17: qty 2

## 2021-12-17 MED ORDER — METHYLPREDNISOLONE 4 MG PO TBPK: ORAL_TABLET | ORAL | 0 refills | Status: DC

## 2021-12-17 NOTE — ED Triage Notes (Signed)
Pt reports doing a breathing treatment at home around 10 last night and states she is out of albuterol. States she last used it today.

## 2021-12-17 NOTE — ED Provider Notes (Signed)
[facility]    CSN: [id number] Arrival date & time: 12/17/21  1240      History   Chief Complaint Chief Complaint  Patient presents with   Shortness of Breath   Cough    HPI TINLEIGH WHITMIRE is a 56 y.o. female.   Patient presents today with a several week history of cough.  She has a history of asthma/COPD reports current symptoms have worsened over the past several days.  She does have albuterol inhaler but use the last of this yesterday and continues to have wheezing and shortness of breath symptoms.  She reports some nasal congestion and cough.  Denies any fever, nausea, vomiting, chest pain, dizziness, syncope.  She has not tried over-the-counter medication for symptom management.  She denies any known sick contacts.  She is a current everyday smoker smoking her typical amount.  She denies any recent antibiotic use.  She is confident that she is not pregnant.   Past Medical History:  Diagnosis Date   Asthma    Bipolar affect, depressed ([place])    Bronchitis    Chronic back pain    Closed left ankle fracture    COPD (chronic obstructive pulmonary disease) (HCC)    DJD (degenerative joint disease)    BACK   GERD (gastroesophageal reflux disease)    Hypertension    Pneumonia    Pre-diabetes    dr entered diabetes without complications- no meds does not test sugar at home   PTSD (post-traumatic stress disorder)    Scoliosis    Seizures ([place])    "when drinking" last yrs ago   Shortness of breath dyspnea    exersion   Substance abuse ([place])    crack cocaine, 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, none since   Thyroid disease    UTI (lower urinary tract infection)     Patient Active Problem List   Diagnosis Date Noted   Lumbar foraminal stenosis 04/21/2020   Lumbar stenosis with neurogenic claudication 06/18/2017   Tobacco abuse 04/19/2016   Primary osteoarthritis of left hip 04/18/2016   Opioid use disorder, mild, abuse ([place]) 09/08/2015   Schizoaffective disorder,  bipolar type ([place]) 09/07/2015   Chronic pain syndrome 09/07/2015   Alcohol use disorder, moderate, in sustained remission ([place]) 09/07/2015   Cocaine use disorder, moderate, in sustained remission ([place]) 09/07/2015   Cannabis use disorder, moderate, in sustained remission ([place]) 09/07/2015   DDD (degenerative disc disease), lumbar 09/07/2015   Hx of fracture of ankle 09/07/2015   Symptomatic Fibroids 08/09/2013   Breast lump on left side at 9 o'clock position 06/29/2013   Breast discharge 06/29/2013    Past Surgical History:  Procedure Laterality Date   2 LEFT TOES SURGERY  12/2014   finished only on right side   BACK SURGERY     BALLOON DILATION N/A 12/14/2015   Procedure: BALLOON DILATION;  Surgeon: Teena Irani, MD;  Location: [facility];  Service: Endoscopy;  Laterality: N/A;   COLONOSCOPY WITH PROPOFOL N/A 12/14/2015   Procedure: COLONOSCOPY WITH PROPOFOL;  Surgeon: Teena Irani, MD;  Location: [facility];  Service: Endoscopy;  Laterality: N/A;   ESOPHAGOGASTRODUODENOSCOPY (EGD) WITH PROPOFOL N/A 12/14/2015   Procedure: ESOPHAGOGASTRODUODENOSCOPY (EGD) WITH PROPOFOL;  Surgeon: Teena Irani, MD;  Location: [facility];  Service: Endoscopy;  Laterality: N/A;   JOINT REPLACEMENT     left hip   LEFT ANKLE SURGERY  MARCH 2016   LUMBAR LAMINECTOMY/DECOMPRESSION MICRODISCECTOMY N/A 06/18/2017   Procedure: LAMINECTOMY AND FORAMINOTOMY LUMBAR FOUR - LUMBAR FIVE;  Surgeon: Ashok Pall, MD;  Location: [facility];  Service: Neurosurgery;  Laterality: N/A;  LAMINECTOMY AND FORAMINOTOMY LUMBAR 4- LUMBAR 5   LUMBAR WOUND DEBRIDEMENT N/A 03/10/2020   Procedure: Lumbar Wound Debridement;  Surgeon: Ashok Pall, MD;  Location: [facility];  Service: Neurosurgery;  Laterality: N/A;  posterior   NECK SURGERY  AS TEENAGER   STITCHES TO NECK    ORIF ANKLE FRACTURE Left 07/24/2021   Procedure: OPEN REDUCTION INTERNAL FIXATION (ORIF) ANKLE FRACTURE;  Surgeon: Renette Butters, MD;  Location: [facility];   Service: Orthopedics;  Laterality: Left;   SURGERY FOR CUT ON STOMACH  TEENAGER   TOTAL HIP ARTHROPLASTY Left 05/14/2016   Procedure: TOTAL HIP ARTHROPLASTY ANTERIOR APPROACH;  Surgeon: Renette Butters, MD;  Location: [facility];  Service: Orthopedics;  Laterality: Left;    OB History     Gravida  1   Para  1   Term  1   Preterm      AB      Living  1      SAB      IAB      Ectopic      Multiple      Live Births               Home Medications    Prior to Admission medications   Medication Sig Start Date End Date Taking? Authorizing Provider  doxycycline (VIBRAMYCIN) 100 MG capsule Take 1 capsule (100 mg total) by mouth 2 (two) times daily. 12/17/21  Yes Jovon Streetman K, PA-C  ABILIFY MAINTENA 400 MG PRSY prefilled syringe SMARTSIG:1 Injection IM Once a Month 06/13/21   [provider]  acetaminophen (TYLENOL) 500 MG tablet Take 2 tablets (1,000 mg total) by mouth every 8 (eight) hours as needed for mild pain or moderate pain. 07/24/21   Britt Bottom, PA-C  albuterol (PROVENTIL) (2.5 MG/3ML) 0.083% nebulizer solution Take 3 mLs (2.5 mg total) by nebulization every 6 (six) hours as needed for wheezing or shortness of breath. 12/17/21   Rorey Bisson, Derry Skill, PA-C  albuterol (VENTOLIN HFA) 108 (90 Base) MCG/ACT inhaler Inhale 2 puffs into the lungs every 6 (six) hours as needed for wheezing. 12/17/21   Kalia Vahey, Derry Skill, PA-C  aspirin EC 81 MG tablet Take 1 tablet (81 mg total) by mouth 2 (two) times daily. For DVT prophylaxis for 30 days after surgery. 07/24/21   Britt Bottom, PA-C  atomoxetine (STRATTERA) 40 MG capsule Take 40 mg by mouth daily. 09/18/19   Elbert Ewings, FNP  benztropine (COGENTIN) 0.5 MG tablet Take 0.5 mg by mouth 2 (two) times daily. 07/12/21   [provider]  budesonide-formoterol (SYMBICORT) 160-4.5 MCG/ACT inhaler Inhale 2 puffs into the lungs in the morning and at bedtime. 11/27/21   Crain, Whitney L, PA  Calcium Carb-Cholecalciferol  (CALCIUM 600/VITAMIN D3 PO) Take 1 tablet by mouth daily.    [provider]  dicyclomine (BENTYL) 20 MG tablet Take 20 mg by mouth 4 (four) times daily. Patient not taking: Reported on 07/27/2021 02/25/20   [provider]  esomeprazole (NEXIUM) 40 MG capsule Take 40 mg by mouth daily. 09/22/19   [provider]  fluticasone (FLONASE) 50 MCG/ACT nasal spray Place 1 spray into both nostrils daily. Patient not taking: Reported on 07/27/2021    [provider]  furosemide (LASIX) 20 MG tablet Take 20-60 mg by mouth daily as needed for fluid or edema.    [provider]  HYDROcodone-acetaminophen (NORCO) 10-325 MG tablet Take 1 tablet by mouth every 6 (six) hours as needed for severe pain. Do not take more than 6 tablets in a 24 hour period. 07/24/21   Britt Bottom, PA-C  hydrOXYzine (VISTARIL) 25 MG capsule Take 25 mg by mouth 3 (three) times daily. 06/16/19   [provider]  ibuprofen (ADVIL) 800 MG tablet Take 800 mg by mouth 2 (two) times daily as needed. Patient not taking: Reported on 07/27/2021 03/15/21   [provider]  lithium carbonate 300 MG capsule Take 600 mg by mouth 2 (two) times daily with a meal. 09/18/19   [provider]  losartan (COZAAR) 25 MG tablet Take 25 mg by mouth daily. 09/18/19   Trey Sailors, PA  methylPREDNISolone (MEDROL DOSEPAK) 4 MG TBPK tablet Take one dose pack per package instructions 12/17/21   Rehana Uncapher, Derry Skill, PA-C  Multiple Vitamin (MULTIVITAMIN) tablet Take 1 tablet by mouth daily. Patient not taking: Reported on 07/27/2021    [provider]  ondansetron (ZOFRAN ODT) 4 MG disintegrating tablet Take 1 tablet (4 mg total) by mouth every 8 (eight) hours as needed for nausea or vomiting. 07/24/21   Britt Bottom, PA-C  PARoxetine (PAXIL) 40 MG tablet Take 40 mg by mouth daily.    [provider]  QUEtiapine (SEROQUEL) 200 MG tablet Take 200 mg by mouth at bedtime.      [provider]  tiZANidine (ZANAFLEX) 4 MG tablet Take 4 mg by mouth every 8 (eight) hours as needed for muscle spasms.  Patient not taking: Reported on 07/27/2021    [provider]  vitamin B-12 (CYANOCOBALAMIN) 500 MCG tablet Take 500 mcg by mouth daily. Patient not taking: Reported on 07/27/2021    [provider]    Family History Family History  Problem Relation Age of Onset   Hypertension Mother    Hypertension Maternal Grandmother    Hypertension Paternal Grandmother    Diabetes Paternal Grandmother    Hypertension Paternal Grandfather    Diabetes Paternal Grandfather    Alcohol abuse Paternal Aunt    Mental illness Cousin    Heart attack Cousin    Heart attack Maternal Uncle     Social History Social History   Tobacco Use   Smoking status: Every Day    Packs/day: 0.50    Years: 26.00    Pack years: 13.00    Types: Cigarettes   Smokeless tobacco: Never  Vaping Use   Vaping Use: Never used  Substance Use Topics   Alcohol use: No    Comment: quit date 01/17/2013   Drug use: No    Types: "Crack" cocaine, Heroin    Comment: none since 03-2020     Allergies   Risperidone and related, Buspirone, Meloxicam, Prednisone, and Gabapentin   Review of Systems Review of Systems  Constitutional:  Positive for activity change. Negative for appetite change, fatigue and fever.  HENT:  Positive for congestion. Negative for sinus pressure, sneezing and sore throat.   Respiratory:  Positive for cough, chest tightness and shortness of breath.   Cardiovascular:  Negative for chest pain.  Gastrointestinal:  Negative for abdominal pain, diarrhea, nausea and vomiting.  Musculoskeletal:  Negative for arthralgias and myalgias.  Neurological:  Negative for dizziness, light-headedness and headaches.    Physical Exam Triage Vital Signs ED Triage Vitals  Enc Vitals Group     BP 12/17/21 1258 (!) 147/77     Pulse Rate 12/17/21 1257 (!)  106     Resp  12/17/21 1257 17     Temp 12/17/21 1258 98.2 F (36.8 C)     Temp Source 12/17/21 1258 Temporal     SpO2 12/17/21 1257 94 %     Weight --      Height --      Head Circumference --      Peak Flow --      Pain Score 12/17/21 1256 4     Pain Loc --      Pain Edu? --      Excl. in [place]? --    No data found.  Updated Vital Signs BP (!) 147/77 (BP Location: Right Arm)    Pulse (!) 106    Temp 98.2 F (36.8 C) (Temporal)    Resp 17    LMP 12/09/2015 Comment: very irregular   SpO2 94%   Visual Acuity Right Eye Distance:   Left Eye Distance:   Bilateral Distance:    Right Eye Near:   Left Eye Near:    Bilateral Near:     Physical Exam Vitals reviewed.  Constitutional:      General: She is awake. She is not in acute distress.    Appearance: Normal appearance. She is well-developed. She is not ill-appearing.     Comments: Very pleasant female appears stated age in no acute distress sitting comfortably in exam room  HENT:     Head: Normocephalic and atraumatic.     Mouth/Throat:     Pharynx: Uvula midline. No oropharyngeal exudate or posterior oropharyngeal erythema.  Cardiovascular:     Rate and Rhythm: Normal rate and regular rhythm.     Heart sounds: Normal heart sounds, S1 normal and S2 normal. No murmur heard. Pulmonary:     Effort: Pulmonary effort is normal.     Breath sounds: Wheezing and rhonchi present. No rales.  Abdominal:     Palpations: Abdomen is soft.     Tenderness: There is no abdominal tenderness.  Psychiatric:        Behavior: Behavior is cooperative.     UC Treatments / Results  Labs (all labs ordered are listed, but only abnormal results are displayed) Labs Reviewed  SARS CORONAVIRUS 2 (TAT 6-24 HRS)  POC INFLUENZA A AND B ANTIGEN (URGENT CARE ONLY)    EKG   Radiology DG Chest 2 View  Result Date: 12/17/2021 CLINICAL DATA:  Shortness of breath and cough for 1-2 weeks. History of smoking and asthma. EXAM: CHEST - 2 VIEW COMPARISON:  11/12/2021  FINDINGS: The cardiac silhouette is upper limits of normal in size. The lungs are well inflated. Compared to the prior study, there is diffuse accentuation of the interstitial markings throughout both lungs, greatest in the lower lungs where there is also mild patchy/hazy airspace opacity. No pleural effusion or pneumothorax is identified. No acute osseous abnormality is seen. IMPRESSION: Bilateral interstitial and hazy airspace opacities concerning for infection (including viral/atypical etiologies). Electronically Signed   By: Logan Bores M.D.   On: 12/17/2021 14:16    Procedures Procedures (including critical care time)  Medications Ordered in UC Medications  ipratropium-albuterol (DUONEB) 0.5-2.5 (3) MG/3ML nebulizer solution 3 mL (3 mLs Nebulization Given 12/17/21 1315)  methylPREDNISolone sodium succinate (SOLU-MEDROL) 125 mg/2 mL injection 80 mg (80 mg Intramuscular Given 12/17/21 1315)    Initial Impression / Assessment and Plan / UC Course  I have reviewed the triage vital signs and the nursing notes.  Pertinent labs & imaging results that  were available during my care of the patient were reviewed by me and considered in my medical decision making (see chart for details).     Patient had significant improvement of symptoms with DuoNeb and Solu-Medrol in clinic.  Chest x-ray was obtained that showed bilateral interstitial thickening concerning for viral versus atypical etiology.  Patient was started on doxycycline to cover for COPD exacerbation as well as atypical pneumonia.  Flu testing was negative.  COVID test is pending.  If patient is positive for COVID we discussed potential utility of antiviral medication; given multiple medications that can interact with Paxlovid will prescribe molnupiravir if needed based on COVID test result.  Refills of albuterol inhaler and nebulizer solution were sent to the pharmacy.  She was encouraged to use this every 4-6 hours as needed.  We will start  methylprednisolone taper tomorrow and she was instructed not to take NSAIDs with this medication due to risk of GI bleeding.  Discussed that if symptoms or not improving she should follow-up with PCP or our clinic within a few days.  If anything worsens and she has severe symptoms including worsening cough, increased dependence of albuterol, nausea/vomiting interfering with oral intake, chest pain, weakness she should go to the emergency room.  She was provided work excuse note with current CDC return to work guidelines based on COVID test result.  Strict return precautions given to which she expressed understanding.  Final Clinical Impressions(s) / UC Diagnoses   Final diagnoses:  COPD exacerbation (HCC)  Acute cough  Shortness of breath     Discharge Instructions      Your chest x-ray showed potentially the beginning of an infection.  We are going to cover you with doxycycline 100 mg twice daily.  We will contact you if your COVID test is positive.  Continue albuterol regularly for symptom relief.  Start Medrol Dosepak as prescribed.  Do not take NSAIDs including aspirin, ibuprofen/Advil, naproxen/Aleve with this medication as it can cause stomach bleeding.  Use Tylenol, Mucinex, Flonase for symptom relief.  If your symptoms or not improving within a few days please return for reevaluation.  If at any point anything worsens and you have severe shortness of breath, high fever, chest pain, nausea/vomiting interfere with oral intake, weakness you need to go to the emergency room.     ED Prescriptions     Medication Sig Dispense Auth. Provider   albuterol (PROVENTIL) (2.5 MG/3ML) 0.083% nebulizer solution Take 3 mLs (2.5 mg total) by nebulization every 6 (six) hours as needed for wheezing or shortness of breath. 75 mL Kirsten Mckone K, PA-C   albuterol (VENTOLIN HFA) 108 (90 Base) MCG/ACT inhaler Inhale 2 puffs into the lungs every 6 (six) hours as needed for wheezing. 18 g Zechariah Bissonnette K, PA-C    methylPREDNISolone (MEDROL DOSEPAK) 4 MG TBPK tablet Take one dose pack per package instructions 21 tablet Rayfield Beem K, PA-C   doxycycline (VIBRAMYCIN) 100 MG capsule Take 1 capsule (100 mg total) by mouth 2 (two) times daily. 20 capsule Rhyan Radler, Derry Skill, PA-C      PDMP not reviewed this encounter.   Terrilee Croak, PA-C 12/17/21 1453

## 2021-12-17 NOTE — ED Triage Notes (Signed)
Pt reports SOB and cough x 1-2 weeks.   States she has gotten worse and has had sweats, vomiting.

## 2021-12-17 NOTE — Discharge Instructions (Signed)
Your chest x-ray showed potentially the beginning of an infection.  We are going to cover you with doxycycline 100 mg twice daily.  We will contact you if your COVID test is positive.  Continue albuterol regularly for symptom relief.  Start Medrol Dosepak as prescribed.  Do not take NSAIDs including aspirin, ibuprofen/Advil, naproxen/Aleve with this medication as it can cause stomach bleeding.  Use Tylenol, Mucinex, Flonase for symptom relief.  If your symptoms or not improving within a few days please return for reevaluation.  If at any point anything worsens and you have severe shortness of breath, high fever, chest pain, nausea/vomiting interfere with oral intake, weakness you need to go to the emergency room.

## 2021-12-18 LAB — SARS CORONAVIRUS 2 (TAT 6-24 HRS): SARS Coronavirus 2: NEGATIVE

## 2022-05-11 ENCOUNTER — Other Ambulatory Visit: Payer: Self-pay

## 2022-05-11 ENCOUNTER — Emergency Department (HOSPITAL_COMMUNITY): Payer: Medicaid Other

## 2022-05-11 ENCOUNTER — Emergency Department (HOSPITAL_COMMUNITY)
Admission: EM | Admit: 2022-05-11 | Discharge: 2022-05-11 | Disposition: A | Discharge status: Home / Self Care | Payer: Medicaid Other | Attending: Emergency Medicine | Admitting: Emergency Medicine

## 2022-05-11 ENCOUNTER — Encounter (HOSPITAL_COMMUNITY): Payer: Self-pay

## 2022-05-11 DIAGNOSIS — S62660A Nondisplaced fracture of distal phalanx of right index finger, initial encounter for closed fracture: Secondary | ICD-10-CM | POA: Diagnosis not present

## 2022-05-11 DIAGNOSIS — Z7951 Long term (current) use of inhaled steroids: Secondary | ICD-10-CM | POA: Diagnosis not present

## 2022-05-11 DIAGNOSIS — J449 Chronic obstructive pulmonary disease, unspecified: Secondary | ICD-10-CM | POA: Insufficient documentation

## 2022-05-11 DIAGNOSIS — S6991XA Unspecified injury of right wrist, hand and finger(s), initial encounter: Secondary | ICD-10-CM | POA: Diagnosis present

## 2022-05-11 DIAGNOSIS — J45909 Unspecified asthma, uncomplicated: Secondary | ICD-10-CM | POA: Insufficient documentation

## 2022-05-11 DIAGNOSIS — W230XXA Caught, crushed, jammed, or pinched between moving objects, initial encounter: Secondary | ICD-10-CM | POA: Insufficient documentation

## 2022-05-11 DIAGNOSIS — S62661A Nondisplaced fracture of distal phalanx of left index finger, initial encounter for closed fracture: Secondary | ICD-10-CM

## 2022-05-11 MED ORDER — ACETAMINOPHEN 500 MG PO TABS
1000.0000 mg | ORAL_TABLET | Freq: Once | ORAL | Status: AC
Start: 2022-05-11 — End: 2022-05-11
  Administered 2022-05-11: 1000 mg via ORAL
  Filled 2022-05-11: qty 2

## 2022-05-11 NOTE — ED Notes (Signed)
Finger splint applied to finger.

## 2022-05-11 NOTE — Discharge Instructions (Addendum)
Please return to the ED with any new or worsening symptoms Please follow-up with your PCP in the next 5 to 7 days for ongoing management Please remain in the finger splint we have provided at all times.  You may remove it to shower. Please continue to treat your pain at home with ibuprofen/Tylenol.  You may also use ice. Please read the attached guide concerning finger fractures for further information

## 2022-05-11 NOTE — ED Provider Notes (Signed)
[facility] DEPT Provider Note   CSN: [id number] Arrival date & time: 05/11/22  1748     History  Chief Complaint  Patient presents with   Finger Injury    Alicia Blake is a 56 y.o. female with medical history of asthma, bronchitis, chronic back pain, COPD, GERD, DJD, PTSD, scoliosis, seizures, substance abuse, thyroid disease.  Patient presents to ED for evaluation of right index finger pain.  Patient reports that prior to arrival she was at home and she noticed a bug on the floor.  The patient states that she tried to "whack" this bug with a dish rag but ended up smashing her right index finger on the ground.  The patient reports immediate pain at this time.  Patient denies any over-the-counter medications prior to arrival.  Patient does have swelling and tenderness to her distal phalanx of her right index finger.  Patient is neurovascularly intact, denies any numbness or tingling.  Patient shows the ability to flex/extend her right index finger.  HPI     Home Medications Prior to Admission medications   Medication Sig Start Date End Date Taking? Authorizing Provider  ABILIFY MAINTENA 400 MG PRSY prefilled syringe SMARTSIG:1 Injection IM Once a Month 06/13/21   [provider]  acetaminophen (TYLENOL) 500 MG tablet Take 2 tablets (1,000 mg total) by mouth every 8 (eight) hours as needed for mild pain or moderate pain. 07/24/21   Britt Bottom, PA-C  albuterol (PROVENTIL) (2.5 MG/3ML) 0.083% nebulizer solution Take 3 mLs (2.5 mg total) by nebulization every 6 (six) hours as needed for wheezing or shortness of breath. 12/17/21   Raspet, Derry Skill, PA-C  albuterol (VENTOLIN HFA) 108 (90 Base) MCG/ACT inhaler Inhale 2 puffs into the lungs every 6 (six) hours as needed for wheezing. 12/17/21   Raspet, Derry Skill, PA-C  aspirin EC 81 MG tablet Take 1 tablet (81 mg total) by mouth 2 (two) times daily. For DVT prophylaxis for 30 days after surgery. 07/24/21    Britt Bottom, PA-C  atomoxetine (STRATTERA) 40 MG capsule Take 40 mg by mouth daily. 09/18/19   Elbert Ewings, FNP  benztropine (COGENTIN) 0.5 MG tablet Take 0.5 mg by mouth 2 (two) times daily. 07/12/21   [provider]  budesonide-formoterol (SYMBICORT) 160-4.5 MCG/ACT inhaler Inhale 2 puffs into the lungs in the morning and at bedtime. 11/27/21   Crain, Whitney L, PA  Calcium Carb-Cholecalciferol (CALCIUM 600/VITAMIN D3 PO) Take 1 tablet by mouth daily.    [provider]  dicyclomine (BENTYL) 20 MG tablet Take 20 mg by mouth 4 (four) times daily. Patient not taking: Reported on 07/27/2021 02/25/20   [provider]  doxycycline (VIBRAMYCIN) 100 MG capsule Take 1 capsule (100 mg total) by mouth 2 (two) times daily. 12/17/21   Raspet, Derry Skill, PA-C  esomeprazole (NEXIUM) 40 MG capsule Take 40 mg by mouth daily. 09/22/19   [provider]  fluticasone (FLONASE) 50 MCG/ACT nasal spray Place 1 spray into both nostrils daily. Patient not taking: Reported on 07/27/2021    [provider]  furosemide (LASIX) 20 MG tablet Take 20-60 mg by mouth daily as needed for fluid or edema.    [provider]  HYDROcodone-acetaminophen (NORCO) 10-325 MG tablet Take 1 tablet by mouth every 6 (six) hours as needed for severe pain. Do not take more than 6 tablets in a 24 hour period. 07/24/21   Britt Bottom, PA-C  hydrOXYzine (VISTARIL) 25 MG capsule Take  25 mg by mouth 3 (three) times daily. 06/16/19   [provider]  ibuprofen (ADVIL) 800 MG tablet Take 800 mg by mouth 2 (two) times daily as needed. Patient not taking: Reported on 07/27/2021 03/15/21   [provider]  lithium carbonate 300 MG capsule Take 600 mg by mouth 2 (two) times daily with a meal. 09/18/19   [provider]  losartan (COZAAR) 25 MG tablet Take 25 mg by mouth daily. 09/18/19   Trey Sailors, PA  methylPREDNISolone (MEDROL DOSEPAK) 4 MG TBPK tablet Take one  dose pack per package instructions 12/17/21   Raspet, Derry Skill, PA-C  Multiple Vitamin (MULTIVITAMIN) tablet Take 1 tablet by mouth daily. Patient not taking: Reported on 07/27/2021    [provider]  ondansetron (ZOFRAN ODT) 4 MG disintegrating tablet Take 1 tablet (4 mg total) by mouth every 8 (eight) hours as needed for nausea or vomiting. 07/24/21   Britt Bottom, PA-C  PARoxetine (PAXIL) 40 MG tablet Take 40 mg by mouth daily.    [provider]  QUEtiapine (SEROQUEL) 200 MG tablet Take 200 mg by mouth at bedtime.     [provider]  tiZANidine (ZANAFLEX) 4 MG tablet Take 4 mg by mouth every 8 (eight) hours as needed for muscle spasms.  Patient not taking: Reported on 07/27/2021    [provider]  vitamin B-12 (CYANOCOBALAMIN) 500 MCG tablet Take 500 mcg by mouth daily. Patient not taking: Reported on 07/27/2021    [provider]      Allergies    Risperidone and related, Buspirone, Meloxicam, Prednisone, and Gabapentin    Review of Systems   Review of Systems  Musculoskeletal:  Positive for arthralgias and myalgias.  Neurological:  Negative for numbness.  All other systems reviewed and are negative.   Physical Exam Updated Vital Signs BP (!) 148/83 (BP Location: Left Arm)   Pulse 96   Temp 98 F (36.7 C) (Oral)   Resp 18   Ht '5\' 6"'$  (1.676 m)   Wt 120.7 kg   LMP 12/09/2015 Comment: very irregular  SpO2 96%   BMI 42.93 kg/m  Physical Exam Vitals and nursing note reviewed.  Constitutional:      General: She is not in acute distress.    Appearance: Normal appearance. She is not ill-appearing, toxic-appearing or diaphoretic.  HENT:     Head: Normocephalic and atraumatic.     Nose: Nose normal. No congestion.     Mouth/Throat:     Mouth: Mucous membranes are moist.     Pharynx: Oropharynx is clear.  Eyes:     Extraocular Movements: Extraocular movements intact.     Conjunctiva/sclera: Conjunctivae normal.     Pupils: Pupils  are equal, round, and reactive to light.  Cardiovascular:     Rate and Rhythm: Normal rate and regular rhythm.  Pulmonary:     Effort: Pulmonary effort is normal.     Breath sounds: Normal breath sounds. No wheezing.  Abdominal:     General: Abdomen is flat.     Palpations: Abdomen is soft.     Tenderness: There is no abdominal tenderness.  Musculoskeletal:     Cervical back: Normal range of motion and neck supple. No tenderness.     Comments: Swelling, ecchymosis noted to distal end of patient right index finger.  Patient neurovascularly intact, able to flex and extend finger well.  Skin:    General: Skin is warm and dry.     Capillary  Refill: Capillary refill takes less than 2 seconds.  Neurological:     Mental Status: She is alert and oriented to person, place, and time.     ED Results / Procedures / Treatments   Labs (all labs ordered are listed, but only abnormal results are displayed) Labs Reviewed - No data to display  EKG None  Radiology DG Finger Index Right  Result Date: 05/11/2022 CLINICAL DATA:  Trauma, pain EXAM: RIGHT INDEX FINGER 2+V COMPARISON:  None Available. FINDINGS: Fracture is seen in the dorsal aspect of base of distal phalanx. Margins of the fracture line appear sclerotic. There is 1 mm distraction of fracture fragments seen in the lateral view. Fracture is difficult to visualize in the AP and oblique views. Bony spurs are noted in the interphalangeal joints, more so in the distal interphalangeal joint of the index finger. IMPRESSION: Fracture is seen in the dorsal aspect of base of distal phalanx of right index finger suggesting recent or old fracture. Degenerative arthritis is seen in the interphalangeal joints, more so in the distal interphalangeal joint of the index finger. Electronically Signed   By: Elmer Picker M.D.   On: 05/11/2022 19:05    Procedures Procedures   Medications Ordered in ED Medications  acetaminophen (TYLENOL) tablet 1,000  mg (1,000 mg Oral Given 05/11/22 1855)    ED Course/ Medical Decision Making/ A&P                           Medical Decision Making Amount and/or Complexity of Data Reviewed Radiology: ordered.  Risk OTC drugs.   56 year old female presents to the ED for evaluation.  Please see HPI for further details.  On examination, the patient distal end of right index finger is swollen and ecchymotic.  The patient is able to flex, extend her finger well.  The patient denies any numbness or tingling into the finger, patient is neurovascularly intact.  2+ radial pulse noted.  Plain film imaging of patient right index finger shows fracture on distal phalanx.  Patient will be placed in finger splint and advised to follow-up with her PCP for further management.  Patient was counseled on RICE methods to control pain as well as the importance of staying in the finger splint and keeping her finger in extension until she has been evaluated by PCP.  Doubt mallet finger at this time as patient is able to flex and extend distal joint.  Patient given return precautions and she voiced understanding.  The patient had all of her questions answered to her satisfaction.  The patient is stable at this time for discharge home.   Final Clinical Impression(s) / ED Diagnoses Final diagnoses:  Nondisplaced fracture of distal phalanx of left index finger, initial encounter for closed fracture    Rx / DC Orders ED Discharge Orders     None         Lawana Chambers 05/11/22 1933    Valarie Merino, MD 05/11/22 2308

## 2022-05-11 NOTE — ED Triage Notes (Signed)
Patient states she had a cloth in her hand and slammed her finger on the floor trying to kill a bug.  Patient c/o right pointer finger pain and swelling.

## 2022-05-11 NOTE — Progress Notes (Signed)
Orthopedic Tech Progress Note Patient Details:  Alicia Blake 06-01-1966 [id number]  Patient ID: [name], female   DOB: 08-30-1966, 56 y.o.   MRN: [id number]  [place] Pink 05/11/2022, 7:47 PMCharging for Ortho supplies. FINGER SPLINT

## 2022-06-22 ENCOUNTER — Emergency Department (HOSPITAL_COMMUNITY): Payer: Medicaid Other

## 2022-06-22 ENCOUNTER — Emergency Department (HOSPITAL_COMMUNITY)
Admission: EM | Admit: 2022-06-22 | Discharge: 2022-06-23 | Disposition: A | Discharge status: Home / Self Care | Payer: Medicaid Other | Attending: Emergency Medicine | Admitting: Emergency Medicine

## 2022-06-22 ENCOUNTER — Encounter (HOSPITAL_COMMUNITY): Payer: Self-pay

## 2022-06-22 ENCOUNTER — Other Ambulatory Visit: Payer: Self-pay

## 2022-06-22 DIAGNOSIS — Z7982 Long term (current) use of aspirin: Secondary | ICD-10-CM | POA: Insufficient documentation

## 2022-06-22 DIAGNOSIS — M7989 Other specified soft tissue disorders: Secondary | ICD-10-CM | POA: Diagnosis not present

## 2022-06-22 DIAGNOSIS — R799 Abnormal finding of blood chemistry, unspecified: Secondary | ICD-10-CM | POA: Diagnosis not present

## 2022-06-22 DIAGNOSIS — X58XXXD Exposure to other specified factors, subsequent encounter: Secondary | ICD-10-CM | POA: Insufficient documentation

## 2022-06-22 DIAGNOSIS — K5900 Constipation, unspecified: Secondary | ICD-10-CM | POA: Insufficient documentation

## 2022-06-22 DIAGNOSIS — S62630G Displaced fracture of distal phalanx of right index finger, subsequent encounter for fracture with delayed healing: Secondary | ICD-10-CM | POA: Diagnosis not present

## 2022-06-22 DIAGNOSIS — R109 Unspecified abdominal pain: Secondary | ICD-10-CM | POA: Insufficient documentation

## 2022-06-22 DIAGNOSIS — S6991XD Unspecified injury of right wrist, hand and finger(s), subsequent encounter: Secondary | ICD-10-CM | POA: Diagnosis present

## 2022-06-22 LAB — COMPREHENSIVE METABOLIC PANEL
ALT: 15 U/L (ref 0–44)
AST: 15 U/L (ref 15–41)
Albumin: 2.9 g/dL — ABNORMAL LOW (ref 3.5–5.0)
Alkaline Phosphatase: 83 U/L (ref 38–126)
Anion gap: 7 (ref 5–15)
BUN: 7 mg/dL (ref 6–20)
CO2: 27 mmol/L (ref 22–32)
Calcium: 8.5 mg/dL — ABNORMAL LOW (ref 8.9–10.3)
Chloride: 102 mmol/L (ref 98–111)
Creatinine, Ser: 0.8 mg/dL (ref 0.44–1.00)
GFR, Estimated: >60 mL/min (ref >60)
Glucose, Bld: 110 mg/dL — ABNORMAL HIGH (ref 70–99)
Potassium: 3.4 mmol/L — ABNORMAL LOW (ref 3.5–5.1)
Sodium: 136 mmol/L (ref 135–145)
Total Bilirubin: 0.3 mg/dL (ref 0.3–1.2)
Total Protein: 7.7 g/dL (ref 6.5–8.1)

## 2022-06-22 LAB — CBC
HCT: 36.5 % (ref 36.0–46.0)
Hemoglobin: 11.5 g/dL — ABNORMAL LOW (ref 12.0–15.0)
MCH: 28.8 pg (ref 26.0–34.0)
MCHC: 31.5 g/dL (ref 30.0–36.0)
MCV: 91.5 fL (ref 80.0–100.0)
Platelets: 252 10*3/uL (ref 150–400)
RBC: 3.99 MIL/uL (ref 3.87–5.11)
RDW: 15.5 % (ref 11.5–15.5)
WBC: 11.5 10*3/uL — ABNORMAL HIGH (ref 4.0–10.5)
nRBC: 0 % (ref 0.0–0.2)

## 2022-06-22 LAB — LIPASE, BLOOD: Lipase: 24 U/L (ref 11–51)

## 2022-06-22 LAB — CBG MONITORING, ED: Glucose-Capillary: 120 mg/dL — ABNORMAL HIGH (ref 70–99)

## 2022-06-22 NOTE — ED Provider Triage Note (Signed)
Emergency Medicine Provider Triage Evaluation Note  SHONDA MANDARINO , a 56 y.o. female  was evaluated in triage.  Pt complains of right index finger pain and lower abdominal pain associated with urinary frequency.  Review of Systems  Positive: Finger pain, abdominal pain Negative: fever  Physical Exam  BP (!) 123/104   Pulse 95   Temp 98.8 F (37.1 C)   Resp 19   Ht '5\' 6"'$  (1.676 m)   Wt 120.7 kg   LMP 12/09/2015 Comment: very irregular  SpO2 96%   BMI 42.93 kg/m  Gen:   Awake, no distress   Resp:  Normal effort  MSK:   Moves extremities without difficulty  Other:    Medical Decision Making  Medically screening exam initiated at 10:40 PM.  Appropriate orders placed.  DAIANA VITIELLO was informed that the remainder of the evaluation will be completed by another provider, this initial triage assessment does not replace that evaluation, and the importance of remaining in the ED until their evaluation is complete.  Labs & x-ray   Karie Kirks 06/22/22 2241

## 2022-06-22 NOTE — ED Triage Notes (Signed)
Ambulatory to ED with c/o R pointer finger pain after trying to kill a cockroach.   Also c/o lower abd/pelvic pain x 3 days. Hx of diverticulitis. Endorses urgency but no dysuria.

## 2022-06-23 LAB — URINALYSIS, ROUTINE W REFLEX MICROSCOPIC
Bilirubin Urine: NEGATIVE
Glucose, UA: NEGATIVE mg/dL
Hgb urine dipstick: NEGATIVE
Ketones, ur: NEGATIVE mg/dL
Nitrite: NEGATIVE
Protein, ur: NEGATIVE mg/dL
Specific Gravity, Urine: 1.017 (ref 1.005–1.030)
pH: 5 (ref 5.0–8.0)

## 2022-06-23 MED ORDER — MAGNESIUM CITRATE PO SOLN: 296.0000 mL | Freq: Once | ORAL | 0 refills | Status: AC

## 2022-06-23 MED ORDER — CELECOXIB 200 MG PO CAPS: 200.0000 mg | ORAL_CAPSULE | Freq: Two times a day (BID) | ORAL | 0 refills | Status: DC

## 2022-06-23 NOTE — ED Provider Notes (Signed)
[facility] DEPT Provider Note   CSN: [id number] Arrival date & time: 06/22/22  2207     History  Chief Complaint  Patient presents with   Abdominal Pain   Finger Injury    Alicia Blake is a 56 y.o. female who presents with multiple complaints.  Patient broke her finger back in July..  She states that she has not been in the splint that was given to her.  She states that she did not know that her finger was broken because she thought it was "just fractured."  She has had persistent pain and increased swelling and thinks that her there may be pus in her finger. Patient has a history of chronic back and lower abdominal pain.  She states that her pain has been worse for the past 4 days.  She been taking Tylenol without relief.  She is some pain that radiates to the back of her left leg.  She denies any weakness, saddle anesthesia, bowel or bladder incontinence.  She states that she is constipated and that is why her stomach is hurting her.  She did not try anything to relieve her bowels.   Abdominal Pain      Home Medications Prior to Admission medications   Medication Sig Start Date End Date Taking? Authorizing Provider  celecoxib (CELEBREX) 200 MG capsule Take 1 capsule (200 mg total) by mouth 2 (two) times daily. 06/23/22  Yes Demarie Hyneman, PA-C  magnesium citrate solution Take 296 mLs by mouth once for 1 dose. OTC 06/23/22 06/23/22 Yes Jenavi Beedle, PA-C  ABILIFY MAINTENA 400 MG PRSY prefilled syringe SMARTSIG:1 Injection IM Once a Month 06/13/21   [provider]  acetaminophen (TYLENOL) 500 MG tablet Take 2 tablets (1,000 mg total) by mouth every 8 (eight) hours as needed for mild pain or moderate pain. 07/24/21   Britt Bottom, PA-C  albuterol (PROVENTIL) (2.5 MG/3ML) 0.083% nebulizer solution Take 3 mLs (2.5 mg total) by nebulization every 6 (six) hours as needed for wheezing or shortness of breath. 12/17/21   Raspet, Derry Skill, PA-C   albuterol (VENTOLIN HFA) 108 (90 Base) MCG/ACT inhaler Inhale 2 puffs into the lungs every 6 (six) hours as needed for wheezing. 12/17/21   Raspet, Derry Skill, PA-C  aspirin EC 81 MG tablet Take 1 tablet (81 mg total) by mouth 2 (two) times daily. For DVT prophylaxis for 30 days after surgery. 07/24/21   Britt Bottom, PA-C  atomoxetine (STRATTERA) 40 MG capsule Take 40 mg by mouth daily. 09/18/19   Elbert Ewings, FNP  benztropine (COGENTIN) 0.5 MG tablet Take 0.5 mg by mouth 2 (two) times daily. 07/12/21   [provider]  budesonide-formoterol (SYMBICORT) 160-4.5 MCG/ACT inhaler Inhale 2 puffs into the lungs in the morning and at bedtime. 11/27/21   Crain, Whitney L, PA  Calcium Carb-Cholecalciferol (CALCIUM 600/VITAMIN D3 PO) Take 1 tablet by mouth daily.    [provider]  cetirizine (ZYRTEC) 10 MG tablet SMARTSIG:1 Tablet(s) By Mouth Every Evening PRN 05/30/22   [provider]  dicyclomine (BENTYL) 20 MG tablet Take 20 mg by mouth 4 (four) times daily. Patient not taking: Reported on 07/27/2021 02/25/20   [provider]  doxycycline (VIBRAMYCIN) 100 MG capsule Take 1 capsule (100 mg total) by mouth 2 (two) times daily. 12/17/21   Raspet, Derry Skill, PA-C  esomeprazole (NEXIUM) 40 MG capsule Take 40 mg by mouth daily. 09/22/19   [provider]  fluticasone (FLONASE) 50 MCG/ACT  nasal spray Place 1 spray into both nostrils daily. Patient not taking: Reported on 07/27/2021    [provider]  furosemide (LASIX) 20 MG tablet Take 20-60 mg by mouth daily as needed for fluid or edema.    [provider]  HYDROcodone-acetaminophen (NORCO) 10-325 MG tablet Take 1 tablet by mouth every 6 (six) hours as needed for severe pain. Do not take more than 6 tablets in a 24 hour period. 07/24/21   Britt Bottom, PA-C  hydrOXYzine (VISTARIL) 25 MG capsule Take 25 mg by mouth 3 (three) times daily. 06/16/19   [provider]  ibuprofen (ADVIL) 800 MG  tablet Take 800 mg by mouth 2 (two) times daily as needed. Patient not taking: Reported on 07/27/2021 03/15/21   [provider]  lithium carbonate 300 MG capsule Take 600 mg by mouth 2 (two) times daily with a meal. 09/18/19   [provider]  losartan (COZAAR) 25 MG tablet Take 25 mg by mouth daily. 09/18/19   Trey Sailors, PA  meloxicam (MOBIC) 15 MG tablet Take 15 mg by mouth daily as needed. 06/07/22   [provider]  methylPREDNISolone (MEDROL DOSEPAK) 4 MG TBPK tablet Take one dose pack per package instructions 12/17/21   Raspet, Erin K, PA-C  montelukast (SINGULAIR) 10 MG tablet Take 10 mg by mouth daily. 04/24/22   [provider]  Multiple Vitamin (MULTIVITAMIN) tablet Take 1 tablet by mouth daily. Patient not taking: Reported on 07/27/2021    [provider]  ondansetron (ZOFRAN ODT) 4 MG disintegrating tablet Take 1 tablet (4 mg total) by mouth every 8 (eight) hours as needed for nausea or vomiting. 07/24/21   Britt Bottom, PA-C  PARoxetine (PAXIL) 40 MG tablet Take 40 mg by mouth daily.    [provider]  potassium chloride (KLOR-CON) 10 MEQ tablet Take 10 mEq by mouth daily. 04/24/22   [provider]  pregabalin (LYRICA) 75 MG capsule Take 75 mg by mouth 2 (two) times daily. 05/30/22   [provider]  QUEtiapine (SEROQUEL) 200 MG tablet Take 200 mg by mouth at bedtime.     [provider]  tiZANidine (ZANAFLEX) 4 MG tablet Take 4 mg by mouth every 8 (eight) hours as needed for muscle spasms.  Patient not taking: Reported on 07/27/2021    [provider]  vitamin B-12 (CYANOCOBALAMIN) 500 MCG tablet Take 500 mcg by mouth daily. Patient not taking: Reported on 07/27/2021    [provider]      Allergies    Risperidone and related, Buspirone, Meloxicam, Prednisone, and Gabapentin    Review of Systems   Review of Systems  Gastrointestinal:  Positive for abdominal pain.     Physical Exam Updated Vital Signs BP (!) 168/78   Pulse 85   Temp 98.1 F (36.7 C) (Oral)   Resp 17   Ht '5\' 6"'$  (1.676 m)   Wt 120.7 kg   LMP 12/09/2015 Comment: very irregular  SpO2 97%   BMI 42.93 kg/m  Physical Exam Vitals and nursing note reviewed.  Constitutional:      General: She is not in acute distress.    Appearance: She is well-developed. She is not diaphoretic.  HENT:     Head: Normocephalic and atraumatic.     Right Ear: External ear normal.     Left Ear: External ear normal.     Nose: Nose normal.     Mouth/Throat:     Mouth: Mucous membranes  are moist.  Eyes:     General: No scleral icterus.    Conjunctiva/sclera: Conjunctivae normal.  Cardiovascular:     Rate and Rhythm: Normal rate and regular rhythm.     Heart sounds: Normal heart sounds. No murmur heard.    No friction rub. No gallop.  Pulmonary:     Effort: Pulmonary effort is normal. No respiratory distress.     Breath sounds: Normal breath sounds.  Abdominal:     General: Bowel sounds are normal. There is no distension.     Palpations: Abdomen is soft. There is no mass.     Tenderness: There is no abdominal tenderness. There is no guarding.  Musculoskeletal:     Cervical back: Normal range of motion.     Comments: Right index finger swelling, less than 2-second cap refill, range of motion limited due to pain no evidence of secondary infection  Skin:    General: Skin is warm and dry.  Neurological:     Mental Status: She is alert and oriented to person, place, and time.  Psychiatric:        Behavior: Behavior normal.     ED Results / Procedures / Treatments   Labs (all labs ordered are listed, but only abnormal results are displayed) Labs Reviewed  COMPREHENSIVE METABOLIC PANEL - Abnormal; Notable for the following components:      Result Value   Potassium 3.4 (*)    Glucose, Bld 110 (*)    Calcium 8.5 (*)    Albumin 2.9 (*)    All other components within normal limits  CBC -  Abnormal; Notable for the following components:   WBC 11.5 (*)    Hemoglobin 11.5 (*)    All other components within normal limits  URINALYSIS, ROUTINE W REFLEX MICROSCOPIC - Abnormal; Notable for the following components:   APPearance HAZY (*)    Leukocytes,Ua TRACE (*)    Bacteria, UA RARE (*)    All other components within normal limits  CBG MONITORING, ED - Abnormal; Notable for the following components:   Glucose-Capillary 120 (*)    All other components within normal limits  LIPASE, BLOOD    EKG None  Radiology DG Finger Index Right  Result Date: 06/22/2022 CLINICAL DATA:  Injury EXAM: RIGHT INDEX FINGER 2+V COMPARISON:  Right index finger x-ray 05/11/2022 FINDINGS: Again seen is an oblique fracture through the posterior base of the second distal phalanx. There has been increased displacement along the articulating surface when compared to the prior study. Fracture fragments are nondisplaced 2.5 mm. There is no dislocation. There is increased surrounding soft tissue swelling. No evidence for foreign body. IMPRESSION: 1. Acute fracture base of the second distal phalanx is again seen, but now appears more displaced along the articulating surface. There is increasing surrounding soft tissue swelling. Electronically Signed   By: Ronney Asters M.D.   On: 06/22/2022 23:05    Procedures Procedures    Medications Ordered in ED Medications - No data to display  ED Course/ Medical Decision Making/ A&P                           Medical Decision Making 56 year old female with history of schizoaffective disorder.  Patient informed that fracture means a broken finger.  She now understands that she needs to maintain immobilization.  I reviewed patient's right index finger x-ray ordered today I independently interpreted these films.  Patient shows significant displacement in comparison to  her previous x-ray from July 15.  Given the length of time of fracture I do not feel comfortable  attempting to improve the alignment.  Patient informed that she needs to keep her finger immobilized.  Will place in finger splint and have her follow-up on-cal Ortho hand.  Patient also complains of back pain.  Symptoms consistent with sciatica.  She has a long standing history of chronic back pain and diagnosis of neurogenic claudication without red flag symptoms.  Patient stating that her stomach hurts but she is constipated and had not had a bowel movement in the last few days.  She has a very benign abdominal exam and is asking if she is ready to leave and would not like any further evaluation.  Given her benign findings on physical examination and reassuring labs I think that this is absolutely reasonable.  Placed in finger splint and warned not to remove it.  Will discharge with Celebrex and MiraLAX.  Discussed strict return precautions and need for outpatient follow-up.  She appears appropriate for discharge at this time   Amount and/or Complexity of Data Reviewed Labs: ordered.    Details: I reviewed triage ordered labs.  Mild hyperglycemia no significant lab abnormalities.  Labs reassuring Radiology: ordered and independent interpretation performed.    Details: I visualized and independently interpreted finger x-ray which shows distal phalanx fracture with worsening displacement.  She has had no new injuries.  Risk OTC drugs. Prescription drug management.          Final Clinical Impression(s) / ED Diagnoses Final diagnoses:  Closed displaced fracture of distal phalanx of right index finger with delayed healing, subsequent encounter    Rx / DC Orders ED Discharge Orders          Ordered    celecoxib (CELEBREX) 200 MG capsule  2 times daily        06/23/22 0554    magnesium citrate solution   Once        06/23/22 0555              Margarita Mail, PA-C 06/23/22 [place], April, MD 06/24/22 [phone]

## 2022-06-23 NOTE — Discharge Instructions (Addendum)
Your finger is broken and because you have not worn your splint your fracture is actually a little bit worse than it was before it is very important that you keep your finger immobilized that means do not take the splint off it has to stay still to heal please follow-up with the hand surgeon follow-up with your primary care doctor about your chronic pain issues.

## 2022-07-23 ENCOUNTER — Ambulatory Visit (HOSPITAL_COMMUNITY): Payer: Medicaid Other | Admitting: Mental Health

## 2022-07-31 ENCOUNTER — Ambulatory Visit: Payer: Medicaid Other

## 2022-09-09 ENCOUNTER — Other Ambulatory Visit: Payer: Self-pay | Admitting: Internal Medicine

## 2022-09-10 LAB — URINE CULTURE
MICRO NUMBER:: 14180557
SPECIMEN QUALITY:: ADEQUATE

## 2022-09-17 ENCOUNTER — Other Ambulatory Visit: Payer: Self-pay | Admitting: Orthopedic Surgery

## 2022-09-17 DIAGNOSIS — M545 Low back pain, unspecified: Secondary | ICD-10-CM

## 2022-09-24 ENCOUNTER — Ambulatory Visit
Admission: RE | Admit: 2022-09-24 | Discharge: 2022-09-24 | Disposition: A | Discharge status: Home / Self Care | Payer: Medicaid Other | Source: Ambulatory Visit | Attending: Orthopedic Surgery | Admitting: Orthopedic Surgery

## 2022-09-24 DIAGNOSIS — M545 Low back pain, unspecified: Secondary | ICD-10-CM

## 2022-09-24 MED ORDER — DIAZEPAM 5 MG PO TABS: 10.0000 mg | ORAL_TABLET | Freq: Once | ORAL | Status: DC

## 2022-09-24 MED ORDER — ONDANSETRON HCL 4 MG/2ML IJ SOLN
4.0000 mg | Freq: Once | INTRAMUSCULAR | Status: DC | PRN
Start: 2022-09-24 — End: 2022-09-25

## 2022-09-24 MED ORDER — IOPAMIDOL (ISOVUE-M 200) INJECTION 41%
15.0000 mL | Freq: Once | INTRAMUSCULAR | Status: AC
  Administered 2022-09-24: 15 mL via INTRATHECAL

## 2022-09-24 MED ORDER — MEPERIDINE HCL 50 MG/ML IJ SOLN: 50.0000 mg | Freq: Once | INTRAMUSCULAR | Status: DC | PRN

## 2022-09-24 NOTE — Discharge Instructions (Signed)

## 2022-10-01 ENCOUNTER — Encounter (HOSPITAL_COMMUNITY): Payer: Self-pay

## 2022-10-01 ENCOUNTER — Emergency Department (HOSPITAL_COMMUNITY): Payer: Medicaid Other

## 2022-10-01 ENCOUNTER — Other Ambulatory Visit: Payer: Self-pay

## 2022-10-01 ENCOUNTER — Emergency Department (HOSPITAL_COMMUNITY)
Admission: EM | Admit: 2022-10-01 | Discharge: 2022-10-02 | Disposition: A | Discharge status: Home / Self Care | Payer: Medicaid Other | Attending: Emergency Medicine | Admitting: Emergency Medicine

## 2022-10-01 DIAGNOSIS — J449 Chronic obstructive pulmonary disease, unspecified: Secondary | ICD-10-CM | POA: Insufficient documentation

## 2022-10-01 DIAGNOSIS — M545 Low back pain, unspecified: Secondary | ICD-10-CM | POA: Insufficient documentation

## 2022-10-01 DIAGNOSIS — I10 Essential (primary) hypertension: Secondary | ICD-10-CM | POA: Diagnosis not present

## 2022-10-01 NOTE — ED Provider Triage Note (Signed)
Emergency Medicine Provider Triage Evaluation Note  Alicia Blake , a 56 y.o. female  was evaluated in triage.  Pt complains of left lower back pain for about 3 days.  She says she remembers slipping down the stairs right when she started noticing the symptoms.  Did not land on her back, but she did twist her body and said the pain started after the fall.  She says it is worse when she moves around.  She denies any bowel or bladder dysfunction, gait abnormalities, fevers, numbness or weakness in lower extremity.  Review of Systems  Positive:  Negative:   Physical Exam  BP (!) 156/93   Pulse (!) 110   Resp 18   LMP 12/09/2015 Comment: very irregular  SpO2 100%  Gen:   Awake, no distress   Resp:  Normal effort  MSK:   Moves extremities without difficulty  Other:  No midline spinal tenderness. Reproducible left lower back pain  Medical Decision Making  Medically screening exam initiated at 4:15 PM.  Appropriate orders placed.  Alicia Blake was informed that the remainder of the evaluation will be completed by another provider, this initial triage assessment does not replace that evaluation, and the importance of remaining in the ED until their evaluation is complete.     Adolphus Birchwood, [place] 10/01/22 1615

## 2022-10-01 NOTE — ED Triage Notes (Signed)
Patient complains of lower back pain following small slip and fall yesterday. Was already experiencing lower back pain with any change in position. No loc. Pain with any change in position

## 2022-10-02 MED ORDER — TRAMADOL HCL 50 MG PO TABS: 50.0000 mg | ORAL_TABLET | Freq: Four times a day (QID) | ORAL | 0 refills | Status: DC | PRN

## 2022-10-02 MED ORDER — NAPROXEN 375 MG PO TABS: 375.0000 mg | ORAL_TABLET | Freq: Two times a day (BID) | ORAL | 0 refills | Status: DC

## 2022-10-02 MED ORDER — HYDROMORPHONE HCL 1 MG/ML IJ SOLN
1.0000 mg | Freq: Once | INTRAMUSCULAR | Status: AC
  Administered 2022-10-02: 1 mg via INTRAMUSCULAR
  Filled 2022-10-02: qty 1

## 2022-10-02 MED ORDER — LIDOCAINE 5 % EX PTCH: 1.0000 | MEDICATED_PATCH | CUTANEOUS | 0 refills | Status: DC

## 2022-10-02 NOTE — Discharge Instructions (Signed)
Take the medications as needed for pain.  Follow-up with your primary care doctor or back doctor next week to make sure your symptoms are improving

## 2022-10-02 NOTE — ED Provider Notes (Signed)
[facility] Provider Note   CSN: [id number] Arrival date & time: 10/01/22  1439     History  No chief complaint on file.   Alicia Blake is a 56 y.o. female.  HPI   Patient has a history of hypertension bronchitis GERD chronic back pain, degenerative joint disease, COPD who presents to the ED with complaints of lower back pain.  Patient states she has history of chronic back pain trouble she had prior back surgery.  She has had trouble with persistent back pain intermittently since she had her surgery.  Patient states however the last several days she has had increasing pain in her lower back.  It stays in the lower back area.  Does not radiate.  She is not having any numbness or weakness.  No abdominal pain.  No fevers or chills.  She has tried over-the-counter medications, ibuprofen, without relief  Home Medications Prior to Admission medications   Medication Sig Start Date End Date Taking? Authorizing Provider  lidocaine (LIDODERM) 5 % Place 1 patch onto the skin daily. Remove & Discard patch within 12 hours or as directed by MD 10/02/22  Yes Dorie Rank, MD  naproxen (NAPROSYN) 375 MG tablet Take 1 tablet (375 mg total) by mouth 2 (two) times daily. 10/02/22  Yes Dorie Rank, MD  traMADol (ULTRAM) 50 MG tablet Take 1 tablet (50 mg total) by mouth every 6 (six) hours as needed. 10/02/22  Yes Dorie Rank, MD  ABILIFY MAINTENA 400 MG PRSY prefilled syringe SMARTSIG:1 Injection IM Once a Month 06/13/21   [provider]  acetaminophen (TYLENOL) 500 MG tablet Take 2 tablets (1,000 mg total) by mouth every 8 (eight) hours as needed for mild pain or moderate pain. 07/24/21   Britt Bottom, PA-C  albuterol (PROVENTIL) (2.5 MG/3ML) 0.083% nebulizer solution Take 3 mLs (2.5 mg total) by nebulization every 6 (six) hours as needed for wheezing or shortness of breath. 12/17/21   Raspet, Derry Skill, PA-C  albuterol (VENTOLIN HFA) 108 (90 Base) MCG/ACT inhaler  Inhale 2 puffs into the lungs every 6 (six) hours as needed for wheezing. 12/17/21   Raspet, Derry Skill, PA-C  aspirin EC 81 MG tablet Take 1 tablet (81 mg total) by mouth 2 (two) times daily. For DVT prophylaxis for 30 days after surgery. 07/24/21   Britt Bottom, PA-C  atomoxetine (STRATTERA) 40 MG capsule Take 40 mg by mouth daily. 09/18/19   Elbert Ewings, FNP  benztropine (COGENTIN) 0.5 MG tablet Take 0.5 mg by mouth 2 (two) times daily. 07/12/21   [provider]  budesonide-formoterol (SYMBICORT) 160-4.5 MCG/ACT inhaler Inhale 2 puffs into the lungs in the morning and at bedtime. 11/27/21   Crain, Whitney L, PA  Calcium Carb-Cholecalciferol (CALCIUM 600/VITAMIN D3 PO) Take 1 tablet by mouth daily.    [provider]  celecoxib (CELEBREX) 200 MG capsule Take 1 capsule (200 mg total) by mouth 2 (two) times daily. 06/23/22   Margarita Mail, PA-C  cetirizine (ZYRTEC) 10 MG tablet SMARTSIG:1 Tablet(s) By Mouth Every Evening PRN 05/30/22   [provider]  dicyclomine (BENTYL) 20 MG tablet Take 20 mg by mouth 4 (four) times daily. Patient not taking: Reported on 07/27/2021 02/25/20   [provider]  doxycycline (VIBRAMYCIN) 100 MG capsule Take 1 capsule (100 mg total) by mouth 2 (two) times daily. 12/17/21   Raspet, Derry Skill, PA-C  esomeprazole (NEXIUM) 40 MG capsule Take 40 mg by mouth daily. 09/22/19  [provider]  fluticasone (FLONASE) 50 MCG/ACT nasal spray Place 1 spray into both nostrils daily. Patient not taking: Reported on 07/27/2021    [provider]  furosemide (LASIX) 20 MG tablet Take 20-60 mg by mouth daily as needed for fluid or edema.    [provider]  HYDROcodone-acetaminophen (NORCO) 10-325 MG tablet Take 1 tablet by mouth every 6 (six) hours as needed for severe pain. Do not take more than 6 tablets in a 24 hour period. 07/24/21   Britt Bottom, PA-C  hydrOXYzine (VISTARIL) 25 MG capsule Take 25 mg by mouth 3 (three)  times daily. 06/16/19   [provider]  ibuprofen (ADVIL) 800 MG tablet Take 800 mg by mouth 2 (two) times daily as needed. Patient not taking: Reported on 07/27/2021 03/15/21   [provider]  lithium carbonate 300 MG capsule Take 600 mg by mouth 2 (two) times daily with a meal. 09/18/19   [provider]  losartan (COZAAR) 25 MG tablet Take 25 mg by mouth daily. 09/18/19   Trey Sailors, PA  meloxicam (MOBIC) 15 MG tablet Take 15 mg by mouth daily as needed. 06/07/22   [provider]  methylPREDNISolone (MEDROL DOSEPAK) 4 MG TBPK tablet Take one dose pack per package instructions 12/17/21   Raspet, Erin K, PA-C  montelukast (SINGULAIR) 10 MG tablet Take 10 mg by mouth daily. 04/24/22   [provider]  Multiple Vitamin (MULTIVITAMIN) tablet Take 1 tablet by mouth daily. Patient not taking: Reported on 07/27/2021    [provider]  ondansetron (ZOFRAN ODT) 4 MG disintegrating tablet Take 1 tablet (4 mg total) by mouth every 8 (eight) hours as needed for nausea or vomiting. 07/24/21   Britt Bottom, PA-C  PARoxetine (PAXIL) 40 MG tablet Take 40 mg by mouth daily.    [provider]  potassium chloride (KLOR-CON) 10 MEQ tablet Take 10 mEq by mouth daily. 04/24/22   [provider]  pregabalin (LYRICA) 75 MG capsule Take 75 mg by mouth 2 (two) times daily. 05/30/22   [provider]  QUEtiapine (SEROQUEL) 200 MG tablet Take 200 mg by mouth at bedtime.     [provider]  tiZANidine (ZANAFLEX) 4 MG tablet Take 4 mg by mouth every 8 (eight) hours as needed for muscle spasms.  Patient not taking: Reported on 07/27/2021    [provider]  vitamin B-12 (CYANOCOBALAMIN) 500 MCG tablet Take 500 mcg by mouth daily. Patient not taking: Reported on 07/27/2021    [provider]      Allergies    Risperidone and related, Buspirone, Meloxicam, and Prednisone    Review of Systems   Review of  Systems  Physical Exam Updated Vital Signs BP 121/88 (BP Location: Right Arm)   Pulse 94   Temp 98.2 F (36.8 C) (Oral)   Resp 16   LMP 12/09/2015 Comment: very irregular  SpO2 94%  Physical Exam Vitals and nursing note reviewed.  Constitutional:      General: She is not in acute distress.    Appearance: She is well-developed.     Comments: Elevated BMI  HENT:     Head: Normocephalic and atraumatic.     Right Ear: External ear normal.     Left Ear: External ear normal.  Eyes:     General: No scleral icterus.       Right eye: No discharge.        Left eye: No discharge.  Conjunctiva/sclera: Conjunctivae normal.  Neck:     Trachea: No tracheal deviation.  Cardiovascular:     Rate and Rhythm: Normal rate.  Pulmonary:     Effort: Pulmonary effort is normal. No respiratory distress.     Breath sounds: No stridor.  Abdominal:     General: Abdomen is protuberant. There is no distension.     Tenderness: There is no abdominal tenderness.  Musculoskeletal:        General: No swelling or deformity.     Cervical back: Neck supple.     Thoracic back: No swelling or deformity.     Lumbar back: Tenderness present. No swelling or deformity.  Skin:    General: Skin is warm and dry.     Findings: No rash.  Neurological:     Mental Status: She is alert.     Cranial Nerves: No cranial nerve deficit (no gross deficits), dysarthria or facial asymmetry.     Motor: No weakness.     Comments: 5 out of 5 strength bilateral lower extremities, normal sensation     ED Results / Procedures / Treatments   Labs (all labs ordered are listed, but only abnormal results are displayed) Labs Reviewed - No data to display  EKG None  Radiology DG Lumbar Spine Complete  Result Date: 10/01/2022 CLINICAL DATA:  Fall EXAM: LUMBAR SPINE - COMPLETE 4+ VIEW COMPARISON:  Lumbar spine x-ray 07/07/2021 FINDINGS: Alignment is anatomic. There is no acute fracture or dislocation. L5-S1 posterior fusion  hardware appears uncomplicated and unchanged. Moderate degenerative disc changes are seen at L4-L5 and L5-S1 with disc space narrowing and endplate osteophyte formation similar to prior. Left hip arthroplasty is present. IMPRESSION: 1. No acute fracture or dislocation. 2. Moderate degenerative disc changes at L4-L5 and Z6-X0. 3. Uncomplicated R6-E4 posterior fusion. Electronically Signed   By: Ronney Asters M.D.   On: 10/01/2022 16:57    Procedures Procedures    Medications Ordered in ED Medications  HYDROmorphone (DILAUDID) injection 1 mg (1 mg Intramuscular Given 10/02/22 0900)    ED Course/ Medical Decision Making/ A&P Clinical Course as of 10/02/22 0901  Wed Oct 02, 2022  0846 L spine without acute findings, post surgical changes [JK]    Clinical Course User Index [JK] Dorie Rank, MD                           Medical Decision Making Amount and/or Complexity of Data Reviewed Radiology: ordered and independent interpretation performed.  Risk Prescription drug management.   She presents with low back pain.  No abdominal pain.  No fever.  No signs of infection or acute neurologic dysfunction.  Will provide a dose of pain medications.  DC home with medications for pain.  Recommend outpatient follow-up with her primary or her spine surgeon        Final Clinical Impression(s) / ED Diagnoses Final diagnoses:  Acute midline low back pain without sciatica    Rx / DC Orders ED Discharge Orders          Ordered    traMADol (ULTRAM) 50 MG tablet  Every 6 hours PRN        10/02/22 0900    lidocaine (LIDODERM) 5 %  Every 24 hours        10/02/22 0900    naproxen (NAPROSYN) 375 MG tablet  2 times daily        10/02/22 0900  Dorie Rank, MD 10/02/22 [phone]

## 2022-10-30 ENCOUNTER — Encounter (INDEPENDENT_AMBULATORY_CARE_PROVIDER_SITE_OTHER): Payer: Self-pay | Admitting: Family Medicine

## 2022-10-30 ENCOUNTER — Encounter (INDEPENDENT_AMBULATORY_CARE_PROVIDER_SITE_OTHER): Payer: Self-pay

## 2022-11-06 ENCOUNTER — Other Ambulatory Visit: Payer: Self-pay | Admitting: Orthopedic Surgery

## 2022-11-07 ENCOUNTER — Other Ambulatory Visit: Payer: Self-pay

## 2022-11-07 DIAGNOSIS — K219 Gastro-esophageal reflux disease without esophagitis: Secondary | ICD-10-CM | POA: Insufficient documentation

## 2022-11-19 ENCOUNTER — Ambulatory Visit (INDEPENDENT_AMBULATORY_CARE_PROVIDER_SITE_OTHER): Payer: Medicaid Other | Admitting: Vascular Surgery

## 2022-11-19 ENCOUNTER — Encounter: Payer: Self-pay | Admitting: Vascular Surgery

## 2022-11-19 VITALS — BP 115/63 | HR 85 | Temp 98.0°F | Resp 16 | Ht 66.0 in | Wt 260.0 lb

## 2022-11-19 DIAGNOSIS — M5136 Other intervertebral disc degeneration, lumbar region: Secondary | ICD-10-CM | POA: Diagnosis not present
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
## 2022-12-03 ENCOUNTER — Other Ambulatory Visit: Payer: Self-pay

## 2022-12-07 ENCOUNTER — Encounter (HOSPITAL_COMMUNITY): Payer: Self-pay

## 2022-12-07 ENCOUNTER — Emergency Department (HOSPITAL_COMMUNITY)
Admission: EM | Admit: 2022-12-07 | Discharge: 2022-12-08 | Disposition: A | Discharge status: Home / Self Care | Payer: Medicaid Other | Attending: Emergency Medicine | Admitting: Emergency Medicine

## 2022-12-07 ENCOUNTER — Other Ambulatory Visit: Payer: Self-pay

## 2022-12-07 DIAGNOSIS — K769 Liver disease, unspecified: Secondary | ICD-10-CM | POA: Diagnosis not present

## 2022-12-07 DIAGNOSIS — S3692XA Contusion of unspecified intra-abdominal organ, initial encounter: Secondary | ICD-10-CM | POA: Insufficient documentation

## 2022-12-07 DIAGNOSIS — I10 Essential (primary) hypertension: Secondary | ICD-10-CM | POA: Insufficient documentation

## 2022-12-07 DIAGNOSIS — Y92009 Unspecified place in unspecified non-institutional (private) residence as the place of occurrence of the external cause: Secondary | ICD-10-CM | POA: Diagnosis not present

## 2022-12-07 DIAGNOSIS — Z7982 Long term (current) use of aspirin: Secondary | ICD-10-CM | POA: Diagnosis not present

## 2022-12-07 DIAGNOSIS — Z79899 Other long term (current) drug therapy: Secondary | ICD-10-CM | POA: Insufficient documentation

## 2022-12-07 DIAGNOSIS — W108XXA Fall (on) (from) other stairs and steps, initial encounter: Secondary | ICD-10-CM | POA: Diagnosis not present

## 2022-12-07 DIAGNOSIS — J449 Chronic obstructive pulmonary disease, unspecified: Secondary | ICD-10-CM | POA: Insufficient documentation

## 2022-12-07 DIAGNOSIS — S3991XA Unspecified injury of abdomen, initial encounter: Secondary | ICD-10-CM | POA: Diagnosis present

## 2022-12-07 DIAGNOSIS — Z7951 Long term (current) use of inhaled steroids: Secondary | ICD-10-CM | POA: Insufficient documentation

## 2022-12-07 DIAGNOSIS — S301XXA Contusion of abdominal wall, initial encounter: Secondary | ICD-10-CM

## 2022-12-07 DIAGNOSIS — S39012A Strain of muscle, fascia and tendon of lower back, initial encounter: Secondary | ICD-10-CM | POA: Insufficient documentation

## 2022-12-07 DIAGNOSIS — W19XXXA Unspecified fall, initial encounter: Secondary | ICD-10-CM

## 2022-12-07 MED ORDER — ONDANSETRON HCL 4 MG/2ML IJ SOLN
4.0000 mg | Freq: Once | INTRAMUSCULAR | Status: AC
  Administered 2022-12-08: 4 mg via INTRAVENOUS
  Filled 2022-12-07: qty 2

## 2022-12-07 MED ORDER — OXYCODONE-ACETAMINOPHEN 5-325 MG PO TABS
1.0000 | ORAL_TABLET | Freq: Once | ORAL | Status: AC
  Administered 2022-12-08: 1 via ORAL
  Filled 2022-12-07: qty 1

## 2022-12-07 NOTE — ED Provider Notes (Signed)
[place] EMERGENCY DEPARTMENT AT [facility] Provider Note   CSN: [id number] Arrival date & time: 12/07/22  2215     History {Add pertinent medical, surgical, social history, OB history to HPI:1} Chief Complaint  Patient presents with   Lytle Michaels    Alicia Blake is a 57 y.o. female.  The history is provided by the patient and medical records.  Fall  Alicia Blake is a 57 y.o. female who presents to the Emergency Department complaining of fall.  Fell down stairs after tripped over shoe.  About 10p.  Most of a flight wood stairs.   Had a nose bleed.  Dizzy, nausea, hit head.  Pain to right flank, left low back.    No recent illness.   Hx/o bipolar, ptsd, htn.   Uses tobacco.  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       Home Medications Prior to Admission medications   Medication Sig Start Date End Date Taking? Authorizing Provider  ABILIFY MAINTENA 400 MG PRSY prefilled syringe SMARTSIG:1 Injection IM Once a Month 06/13/21   [provider]  acetaminophen (TYLENOL) 500 MG tablet Take 2 tablets (1,000 mg total) by mouth every 8 (eight) hours as needed for mild pain or moderate pain. 07/24/21   Britt Bottom, PA-C  albuterol (PROVENTIL) (2.5 MG/3ML) 0.083% nebulizer solution Take 3 mLs (2.5 mg total) by nebulization every 6 (six) hours as needed for wheezing or shortness of breath. 12/17/21   Raspet, Derry Skill, PA-C  albuterol (VENTOLIN HFA) 108 (90 Base) MCG/ACT inhaler Inhale 2 puffs into the lungs every 6 (six) hours as needed for wheezing. 12/17/21   Raspet, Derry Skill, PA-C  aspirin EC 81 MG tablet Take 1 tablet (81 mg total) by mouth 2 (two) times daily. For DVT prophylaxis for 30 days after surgery. 07/24/21   Britt Bottom, PA-C  atomoxetine (STRATTERA) 40 MG capsule Take 40 mg by mouth daily. Patient not taking: Reported on 11/19/2022 09/18/19   Elbert Ewings, FNP  benztropine (COGENTIN) 0.5 MG tablet Take 0.5 mg by mouth 2 (two) times  daily. Patient not taking: Reported on 11/19/2022 07/12/21   [provider]  budesonide-formoterol (SYMBICORT) 160-4.5 MCG/ACT inhaler Inhale 2 puffs into the lungs in the morning and at bedtime. Patient not taking: Reported on 11/19/2022 11/27/21   Geryl Councilman L, PA  Calcium Carb-Cholecalciferol (CALCIUM 600/VITAMIN D3 PO) Take 1 tablet by mouth daily.    [provider]  celecoxib (CELEBREX) 200 MG capsule Take 1 capsule (200 mg total) by mouth 2 (two) times daily. 06/23/22   Margarita Mail, PA-C  cetirizine (ZYRTEC) 10 MG tablet SMARTSIG:1 Tablet(s) By Mouth Every Evening PRN Patient not taking: Reported on 11/19/2022 05/30/22   [provider]  cyclobenzaprine (FLEXERIL) 10 MG tablet Take 10 mg by mouth 3 (three) times daily. Patient not taking: Reported on 11/19/2022 07/20/22   [provider]  dicyclomine (BENTYL) 20 MG tablet Take 20 mg by mouth 4 (four) times daily. Patient not taking: Reported on 07/27/2021 02/25/20   [provider]  doxycycline (VIBRAMYCIN) 100 MG capsule Take 1 capsule (100 mg total) by mouth 2 (two) times daily. Patient not taking: Reported on 11/19/2022 12/17/21   Raspet, Derry Skill, PA-C  esomeprazole (NEXIUM) 40 MG capsule Take 40 mg by mouth daily. 09/22/19   [provider]  fluticasone (FLONASE) 50 MCG/ACT nasal spray Place 1 spray into both nostrils daily.    [provider]  furosemide (LASIX) 20 MG tablet Take 20-60 mg by mouth daily as needed for fluid or edema. Patient not taking: Reported on 11/19/2022    [provider]  furosemide (LASIX) 40 MG tablet Take 40 mg by mouth daily as needed. 10/30/22   [provider]  HYDROcodone-acetaminophen (NORCO) 10-325 MG tablet Take 1 tablet by mouth every 6 (six) hours as needed for severe pain. Do not take more than 6 tablets in a 24 hour period. Patient not taking: Reported on 11/19/2022 07/24/21   Britt Bottom, PA-C  hydrOXYzine (VISTARIL) 25 MG  capsule Take 25 mg by mouth 3 (three) times daily. 06/16/19   [provider]  ibuprofen (ADVIL) 800 MG tablet Take 800 mg by mouth 2 (two) times daily as needed. Patient not taking: Reported on 07/27/2021 03/15/21   [provider]  lidocaine (LIDODERM) 5 % Place 1 patch onto the skin daily. Remove & Discard patch within 12 hours or as directed by MD 10/02/22   Dorie Rank, MD  lithium 300 MG tablet Take 300 mg by mouth 2 (two) times daily. 10/07/22   [provider]  lithium carbonate 300 MG capsule Take 600 mg by mouth 2 (two) times daily with a meal. 09/18/19   [provider]  losartan (COZAAR) 25 MG tablet Take 25 mg by mouth daily. 09/18/19   Trey Sailors, PA  meloxicam (MOBIC) 15 MG tablet Take 15 mg by mouth daily as needed. Patient not taking: Reported on 11/19/2022 06/07/22   [provider]  methocarbamol (ROBAXIN) 500 MG tablet Take 500 mg by mouth every 8 (eight) hours as needed. Patient not taking: Reported on 11/19/2022 10/19/22   [provider]  methylPREDNISolone (MEDROL DOSEPAK) 4 MG TBPK tablet Take one dose pack per package instructions Patient not taking: Reported on 11/19/2022 12/17/21   Raspet, Erin K, PA-C  montelukast (SINGULAIR) 10 MG tablet Take 10 mg by mouth daily. Patient not taking: Reported on 11/19/2022 04/24/22   [provider]  Multiple Vitamin (MULTIVITAMIN) tablet Take 1 tablet by mouth daily.    [provider]  naproxen (NAPROSYN) 375 MG tablet Take 1 tablet (375 mg total) by mouth 2 (two) times daily. 10/02/22   Dorie Rank, MD  naproxen (NAPROSYN) 500 MG tablet Take 500 mg by mouth 2 (two) times daily. 10/19/22   [provider]  nitrofurantoin, macrocrystal-monohydrate, (MACROBID) 100 MG capsule Take 100 mg by mouth 2 (two) times daily. 10/09/22   [provider]  ondansetron (ZOFRAN ODT) 4 MG disintegrating tablet Take 1 tablet (4 mg total) by mouth every 8 (eight) hours  as needed for nausea or vomiting. Patient not taking: Reported on 11/19/2022 07/24/21   Britt Bottom, PA-C  oxybutynin (DITROPAN) 5 MG tablet Take 5 mg by mouth 2 (two) times daily. 09/09/22   [provider]  PARoxetine (PAXIL) 20 MG tablet Take 20 mg by mouth daily. 08/29/22   [provider]  PARoxetine (PAXIL) 40 MG tablet Take 40 mg by mouth daily.    [provider]  PARoxetine (PAXIL-CR) 25 MG 24 hr tablet Take 25 mg by mouth daily. 10/07/22   [provider]  potassium chloride (KLOR-CON) 10 MEQ tablet Take 10 mEq by mouth daily. 04/24/22   [provider]  predniSONE (DELTASONE) 20 MG tablet Take 40 mg by mouth daily. Patient not taking: Reported on 11/19/2022 07/20/22   [provider]  pregabalin (LYRICA) 75 MG capsule Take 75 mg by mouth  2 (two) times daily. Patient not taking: Reported on 11/19/2022 05/30/22   [provider]  QUEtiapine (SEROQUEL) 200 MG tablet Take 200 mg by mouth at bedtime.     [provider]  QUEtiapine (SEROQUEL) 300 MG tablet Take 300 mg by mouth at bedtime. 10/29/22   [provider]  tiZANidine (ZANAFLEX) 4 MG tablet Take 4 mg by mouth every 8 (eight) hours as needed for muscle spasms.  Patient not taking: Reported on 07/27/2021    [provider]  traMADol (ULTRAM) 50 MG tablet Take 1 tablet (50 mg total) by mouth every 6 (six) hours as needed. 10/02/22   Dorie Rank, MD  vitamin B-12 (CYANOCOBALAMIN) 500 MCG tablet Take 500 mcg by mouth daily.    [provider]      Allergies    Risperidone and related, Buspirone, Meloxicam, and Prednisone    Review of Systems   Review of Systems  All other systems reviewed and are negative.   Physical Exam Updated Vital Signs BP 126/79   Pulse 90   Temp 98.3 F (36.8 C) (Oral)   Resp (!) 22   LMP 12/09/2015 Comment: very irregular  SpO2 93%  Physical Exam Vitals and nursing note reviewed.  Constitutional:       Appearance: She is well-developed.  HENT:     Head: Normocephalic and atraumatic.  Cardiovascular:     Rate and Rhythm: Normal rate and regular rhythm.     Heart sounds: No murmur heard. Pulmonary:     Effort: Pulmonary effort is normal. No respiratory distress.     Breath sounds: Normal breath sounds.  Abdominal:     Palpations: Abdomen is soft.     Tenderness: There is no abdominal tenderness. There is no guarding or rebound.  Musculoskeletal:        General: No swelling.     Comments: Mild mid thoracic and mid lumbar tenderness to palpation.  Mild Paraspinous tenderness throughout the back.  Mild right mid axillary tenderness.    Skin:    General: Skin is warm and dry.  Neurological:     Mental Status: She is alert and oriented to person, place, and time.  Psychiatric:        Behavior: Behavior normal.     ED Results / Procedures / Treatments   Labs (all labs ordered are listed, but only abnormal results are displayed) Labs Reviewed - No data to display  EKG None  Radiology No results found.  Procedures Procedures  {Document cardiac monitor, telemetry assessment procedure when appropriate:1}  Medications Ordered in ED Medications - No data to display  ED Course/ Medical Decision Making/ A&P   {   Click here for ABCD2, HEART and other calculatorsREFRESH Note before signing :1}                          Medical Decision Making  ***  {Document critical care time when appropriate:1} {Document review of labs and clinical decision tools ie heart score, Chads2Vasc2 etc:1}  {Document your independent review of radiology images, and any outside records:1} {Document your discussion with family members, caretakers, and with consultants:1} {Document social determinants of health affecting pt's care:1} {Document your decision making why or why not admission, treatments were needed:1} Final Clinical Impression(s) / ED Diagnoses Final diagnoses:  None    Rx / DC  Orders ED Discharge Orders     None

## 2022-12-07 NOTE — ED Triage Notes (Signed)
Pt. BIB [facility] from home for a fall down 8 stairs. Pt. Slid down on her back. She denies LOC. Unknown if she hit her head, but endorses a headache. Pt. Not taking blood thinners. Pt. Also c/o R. Rib pain.

## 2022-12-08 ENCOUNTER — Emergency Department (HOSPITAL_COMMUNITY): Payer: Medicaid Other

## 2022-12-08 ENCOUNTER — Emergency Department (HOSPITAL_BASED_OUTPATIENT_CLINIC_OR_DEPARTMENT_OTHER)
Admission: EM | Admit: 2022-12-08 | Discharge: 2022-12-08 | Disposition: A | Discharge status: Home / Self Care | Payer: Medicaid Other | Source: Home / Self Care | Attending: Emergency Medicine | Admitting: Emergency Medicine

## 2022-12-08 ENCOUNTER — Encounter (HOSPITAL_COMMUNITY): Payer: Self-pay

## 2022-12-08 DIAGNOSIS — M545 Low back pain, unspecified: Secondary | ICD-10-CM

## 2022-12-08 DIAGNOSIS — T148XXA Other injury of unspecified body region, initial encounter: Secondary | ICD-10-CM

## 2022-12-08 DIAGNOSIS — S39012A Strain of muscle, fascia and tendon of lower back, initial encounter: Secondary | ICD-10-CM | POA: Insufficient documentation

## 2022-12-08 DIAGNOSIS — W19XXXD Unspecified fall, subsequent encounter: Secondary | ICD-10-CM

## 2022-12-08 DIAGNOSIS — W109XXA Fall (on) (from) unspecified stairs and steps, initial encounter: Secondary | ICD-10-CM | POA: Insufficient documentation

## 2022-12-08 LAB — COMPREHENSIVE METABOLIC PANEL
ALT: 15 U/L (ref 0–44)
AST: 17 U/L (ref 15–41)
Albumin: 3.5 g/dL (ref 3.5–5.0)
Alkaline Phosphatase: 67 U/L (ref 38–126)
Anion gap: 10 (ref 5–15)
BUN: 6 mg/dL (ref 6–20)
CO2: 28 mmol/L (ref 22–32)
Calcium: 9.1 mg/dL (ref 8.9–10.3)
Chloride: 96 mmol/L — ABNORMAL LOW (ref 98–111)
Creatinine, Ser: 0.87 mg/dL (ref 0.44–1.00)
GFR, Estimated: >60 mL/min (ref >60)
Glucose, Bld: 119 mg/dL — ABNORMAL HIGH (ref 70–99)
Potassium: 3.4 mmol/L — ABNORMAL LOW (ref 3.5–5.1)
Sodium: 134 mmol/L — ABNORMAL LOW (ref 135–145)
Total Bilirubin: 0.7 mg/dL (ref 0.3–1.2)
Total Protein: 8.4 g/dL — ABNORMAL HIGH (ref 6.5–8.1)

## 2022-12-08 LAB — CBC WITH DIFFERENTIAL/PLATELET
Abs Immature Granulocytes: 0.02 10*3/uL (ref 0.00–0.07)
Basophils Absolute: 0 10*3/uL (ref 0.0–0.1)
Basophils Relative: 0 %
Eosinophils Absolute: 0.2 10*3/uL (ref 0.0–0.5)
Eosinophils Relative: 2 %
HCT: 39 % (ref 36.0–46.0)
Hemoglobin: 12.7 g/dL (ref 12.0–15.0)
Immature Granulocytes: 0 %
Lymphocytes Relative: 13 %
Lymphs Abs: 1 10*3/uL (ref 0.7–4.0)
MCH: 29.5 pg (ref 26.0–34.0)
MCHC: 32.6 g/dL (ref 30.0–36.0)
MCV: 90.5 fL (ref 80.0–100.0)
Monocytes Absolute: 0.4 10*3/uL (ref 0.1–1.0)
Monocytes Relative: 6 %
Neutro Abs: 6 10*3/uL (ref 1.7–7.7)
Neutrophils Relative %: 79 %
Platelets: 206 10*3/uL (ref 150–400)
RBC: 4.31 MIL/uL (ref 3.87–5.11)
RDW: 14.5 % (ref 11.5–15.5)
WBC: 7.6 10*3/uL (ref 4.0–10.5)
nRBC: 0 % (ref 0.0–0.2)

## 2022-12-08 LAB — TROPONIN I (HIGH SENSITIVITY): Troponin I (High Sensitivity): 2 ng/L (ref <18)

## 2022-12-08 LAB — LIPASE, BLOOD: Lipase: 25 U/L (ref 11–51)

## 2022-12-08 MED ORDER — KETOROLAC TROMETHAMINE 60 MG/2ML IM SOLN
30.0000 mg | Freq: Once | INTRAMUSCULAR | Status: AC
  Administered 2022-12-08: 30 mg via INTRAMUSCULAR
  Filled 2022-12-08: qty 2

## 2022-12-08 MED ORDER — IOHEXOL 300 MG/ML  SOLN
100.0000 mL | Freq: Once | INTRAMUSCULAR | Status: AC | PRN
  Administered 2022-12-08: 100 mL via INTRAVENOUS

## 2022-12-08 MED ORDER — CYCLOBENZAPRINE HCL 5 MG PO TABS
5.0000 mg | ORAL_TABLET | Freq: Once | ORAL | Status: AC
  Administered 2022-12-08: 5 mg via ORAL
  Filled 2022-12-08: qty 1

## 2022-12-08 MED ORDER — ACETAMINOPHEN 325 MG PO TABS
650.0000 mg | ORAL_TABLET | Freq: Once | ORAL | Status: AC
  Administered 2022-12-08: 650 mg via ORAL
  Filled 2022-12-08: qty 2

## 2022-12-08 MED ORDER — OXYCODONE-ACETAMINOPHEN 5-325 MG PO TABS
1.0000 | ORAL_TABLET | ORAL | Status: DC | PRN
  Administered 2022-12-08: 1 via ORAL
  Filled 2022-12-08: qty 1

## 2022-12-08 MED ORDER — CYCLOBENZAPRINE HCL 10 MG PO TABS: 10.0000 mg | ORAL_TABLET | Freq: Two times a day (BID) | ORAL | 0 refills | Status: DC | PRN

## 2022-12-08 NOTE — ED Triage Notes (Signed)
Pt caox4 c/o back/rib pain and aphasia after falling while going down the stairs at approx 2130 last night. Pt was evaluated after fall at [facility] last night and discharged. Pt states she hit her back and head on the stairs when she fell, denies LOC. No extremity weakness.

## 2022-12-08 NOTE — ED Notes (Signed)
Pt arrives via [facility]. Pt sustained fall backwards down set of stairs last night. States she was seen at [facility], testing was done, but she wasn't given anything for pain, and then discharged. Pt tells EMS she is having right flank pain and under right breast and hard to take deep breath, vss per EMS. Alert and oriented x4 per EMS

## 2022-12-08 NOTE — Discharge Instructions (Signed)
You had a CT scan performed in the Emergency Department today that showed an abnormal area on your liver that will need to be further looked at by your family doctor.

## 2022-12-08 NOTE — ED Provider Notes (Signed)
[place] Provider Note   CSN: [id number] Arrival date & time: 12/08/22  1214     History  Chief Complaint  Patient presents with   Fall   Back Pain    Alicia Blake is a 57 y.o. female presented after having a fall last night.  Patient stated she slipped on the last step going down the stairs and fell forward hitting her head.  Patient is unsure if she lost consciousness.  Patient was evaluated at [facility] last night with full workup that resulted in a flank contusion and a liver lesion that would need CT follow-up outpatient.  States there has not been any new changes in her symptoms.  Patient states before she went to [place] long she cannot walk and she can stand and take few steps.  Patient states Tylenol is not touching the pain.  Patient stated she is out of tramadol at home.  Patient states she has headache all over her head that has been present since the fall and has not worsened or gotten better since she was discharged last night.  Patient states she has pain in her left lower back that has been the same since she was discharged.  Patient denied any traumas since being discharged.  Patient is able to bear weight but endorsed pain in doing so.   Patient denied chest pain, shortness of breath, abdominal pain, blood thinners, syncope, urinary/fecal incontinence, saddle anesthesia    Home Medications Prior to Admission medications   Medication Sig Start Date End Date Taking? Authorizing Provider  cyclobenzaprine (FLEXERIL) 10 MG tablet Take 1 tablet (10 mg total) by mouth 2 (two) times daily as needed for muscle spasms. 12/08/22  Yes Deontray Hunnicutt, Florene Route, PA-C  ABILIFY MAINTENA 400 MG PRSY prefilled syringe SMARTSIG:1 Injection IM Once a Month 06/13/21   [provider]  acetaminophen (TYLENOL) 500 MG tablet Take 2 tablets (1,000 mg total) by mouth every 8 (eight) hours as needed for mild pain or moderate pain. Patient  not taking: Reported on 12/08/2022 07/24/21   Britt Bottom, PA-C  albuterol (PROVENTIL) (2.5 MG/3ML) 0.083% nebulizer solution Take 3 mLs (2.5 mg total) by nebulization every 6 (six) hours as needed for wheezing or shortness of breath. 12/17/21   Raspet, Derry Skill, PA-C  albuterol (VENTOLIN HFA) 108 (90 Base) MCG/ACT inhaler Inhale 2 puffs into the lungs every 6 (six) hours as needed for wheezing. Patient not taking: Reported on 12/08/2022 12/17/21   Raspet, Derry Skill, PA-C  aspirin EC 81 MG tablet Take 1 tablet (81 mg total) by mouth 2 (two) times daily. For DVT prophylaxis for 30 days after surgery. Patient not taking: Reported on 12/08/2022 07/24/21   Britt Bottom, PA-C  budesonide-formoterol [facility]) 160-4.5 MCG/ACT inhaler Inhale 2 puffs into the lungs in the morning and at bedtime. Patient not taking: Reported on 11/19/2022 11/27/21   Geryl Councilman L, PA  celecoxib (CELEBREX) 200 MG capsule Take 1 capsule (200 mg total) by mouth 2 (two) times daily. 06/23/22   Margarita Mail, PA-C  cetirizine (ZYRTEC) 10 MG tablet Take 10 mg by mouth 2 (two) times daily. 05/30/22   [provider]  doxycycline (VIBRAMYCIN) 100 MG capsule Take 1 capsule (100 mg total) by mouth 2 (two) times daily. Patient not taking: Reported on 11/19/2022 12/17/21   Raspet, Derry Skill, PA-C  esomeprazole (NEXIUM) 40 MG capsule Take 40 mg by mouth daily. 09/22/19   [provider]  fluticasone (  FLONASE) 50 MCG/ACT nasal spray Place 2 sprays into both nostrils daily.    [provider]  furosemide (LASIX) 40 MG tablet Take 40 mg by mouth daily as needed for fluid or edema. 10/30/22   [provider]  HYDROcodone-acetaminophen (NORCO) 10-325 MG tablet Take 1 tablet by mouth every 6 (six) hours as needed for severe pain. Do not take more than 6 tablets in a 24 hour period. Patient not taking: Reported on 11/19/2022 07/24/21   Britt Bottom, PA-C  hydrOXYzine (VISTARIL) 25 MG capsule Take 25 mg by mouth daily  as needed for anxiety. 06/16/19   [provider]  lidocaine (LIDODERM) 5 % Place 1 patch onto the skin daily. Remove & Discard patch within 12 hours or as directed by MD Patient not taking: Reported on 12/08/2022 10/02/22   Dorie Rank, MD  lithium 300 MG tablet Take 300 mg by mouth 2 (two) times daily. 10/07/22   [provider]  losartan (COZAAR) 25 MG tablet Take 25 mg by mouth daily. 09/18/19   Trey Sailors, PA  methylPREDNISolone (MEDROL DOSEPAK) 4 MG TBPK tablet Take one dose pack per package instructions Patient not taking: Reported on 11/19/2022 12/17/21   Raspet, Erin K, PA-C  montelukast (SINGULAIR) 10 MG tablet Take 10 mg by mouth daily. 04/24/22   [provider]  Multiple Vitamin (MULTIVITAMIN) tablet Take 1 tablet by mouth daily.    [provider]  naproxen (NAPROSYN) 375 MG tablet Take 1 tablet (375 mg total) by mouth 2 (two) times daily. Patient not taking: Reported on 12/08/2022 10/02/22   Dorie Rank, MD  naproxen (NAPROSYN) 500 MG tablet Take 500 mg by mouth 2 (two) times daily. 10/19/22   [provider]  ondansetron (ZOFRAN ODT) 4 MG disintegrating tablet Take 1 tablet (4 mg total) by mouth every 8 (eight) hours as needed for nausea or vomiting. Patient not taking: Reported on 11/19/2022 07/24/21   Britt Bottom, PA-C  oxybutynin (DITROPAN) 5 MG tablet Take 5 mg by mouth 2 (two) times daily. 09/09/22   [provider]  PARoxetine (PAXIL-CR) 25 MG 24 hr tablet Take 25 mg by mouth daily. 10/07/22   [provider]  potassium chloride (KLOR-CON) 10 MEQ tablet Take 10 mEq by mouth daily. 04/24/22   [provider]  QUEtiapine (SEROQUEL) 300 MG tablet Take 300 mg by mouth at bedtime. 10/29/22   [provider]  traMADol (ULTRAM) 50 MG tablet Take 1 tablet (50 mg total) by mouth every 6 (six) hours as needed. Patient taking differently: Take 50 mg by mouth every 6 (six) hours as needed for moderate pain or  severe pain. 10/02/22   Dorie Rank, MD      Allergies    Risperidone and related, Buspirone, Meloxicam, and Prednisone    Review of Systems   Review of Systems  Musculoskeletal:  Positive for back pain.  See HPI  Physical Exam Updated Vital Signs BP (!) 158/83 (BP Location: Left Wrist)   Pulse 88   Temp 98.7 F (37.1 C) (Oral)   Resp 16   Ht 5' 6"$  (1.676 m)   Wt 118.8 kg   LMP 12/09/2015 Comment: very irregular  SpO2 97%   BMI 42.29 kg/m  Physical Exam Vitals and nursing note reviewed.  Constitutional:      General: She is not in acute distress.    Appearance: She is well-developed.  HENT:     Head: Normocephalic and atraumatic.  Right Ear: Tympanic membrane, ear canal and external ear normal.     Left Ear: Tympanic membrane, ear canal and external ear normal.     Ears:     Comments: No hemotympanum noted No postauricular ecchymosis    Mouth/Throat:     Mouth: Mucous membranes are moist.  Eyes:     Extraocular Movements: Extraocular movements intact.     Conjunctiva/sclera: Conjunctivae normal.     Pupils: Pupils are equal, round, and reactive to light.     Comments: No periorbital ecchymosis  Cardiovascular:     Rate and Rhythm: Normal rate and regular rhythm.     Pulses: Normal pulses.     Heart sounds: No murmur heard.    Comments: 2+ bilateral radial/PT pulses with regular rate Pulmonary:     Effort: Pulmonary effort is normal. No respiratory distress.     Breath sounds: Normal breath sounds.  Abdominal:     Palpations: Abdomen is soft.     Tenderness: There is no abdominal tenderness. There is no guarding or rebound.  Musculoskeletal:        General: No swelling. Normal range of motion.     Cervical back: Neck supple.  Skin:    General: Skin is warm and dry.     Capillary Refill: Capillary refill takes less than 2 seconds.  Neurological:     Mental Status: She is alert.     Cranial Nerves: Cranial nerves 2-12 are intact.     Sensory: Sensation is  intact.     Motor: Motor function is intact.     Coordination: Coordination is intact.     Gait: Gait is intact.  Psychiatric:        Mood and Affect: Mood normal.     ED Results / Procedures / Treatments   Labs (all labs ordered are listed, but only abnormal results are displayed) Labs Reviewed - No data to display  EKG None  Radiology CT CHEST ABDOMEN PELVIS W CONTRAST  Result Date: 12/08/2022 CLINICAL DATA:  Polytrauma, blunt. Pt fell down 8 steps, slid down her back. Unknown if she hit her head, but complaining of headache. Right upper back pain, right side/rib pain. EXAM: CT CHEST, ABDOMEN, AND PELVIS WITH CONTRAST TECHNIQUE: Multidetector CT imaging of the chest, abdomen and pelvis was performed following the standard protocol during bolus administration of intravenous contrast. RADIATION DOSE REDUCTION: This exam was performed according to the departmental dose-optimization program which includes automated exposure control, adjustment of the mA and/or kV according to patient size and/or use of iterative reconstruction technique. CONTRAST:  144m OMNIPAQUE IOHEXOL 300 MG/ML  SOLN COMPARISON:  CT abdomen pelvis 07/16/2021 FINDINGS: CHEST: Cardiovascular: No aortic injury. The thoracic aorta is normal in caliber. The heart is normal in size. Stable small volume pericardial effusion. Mediastinum/Nodes: No pneumomediastinum. No mediastinal hematoma. The esophagus is unremarkable. The thyroid is unremarkable. The central airways are patent. No mediastinal, hilar, or axillary lymphadenopathy. Lungs/Pleura: No focal consolidation. No pulmonary nodule. No pulmonary mass. No pulmonary contusion or laceration. No pneumatocele formation. No pleural effusion. No pneumothorax. No hemothorax. Musculoskeletal/Chest wall: No chest wall mass. No acute rib or sternal fracture. No spinal fracture. ABDOMEN / PELVIS: Hepatobiliary: Not enlarged. Persistent irregular hypodensity along the left lateral hepatic  lobe. No laceration or subcapsular hematoma. The gallbladder is otherwise unremarkable with no radio-opaque gallstones. No biliary ductal dilatation. Pancreas: Normal pancreatic contour. No main pancreatic duct dilatation. Spleen: Not enlarged. No focal lesion. No laceration, subcapsular hematoma, or vascular injury.  Adrenals/Urinary Tract: No nodularity bilaterally. Bilateral kidneys enhance symmetrically. No hydronephrosis. No contusion, laceration, or subcapsular hematoma. No injury to the vascular structures or collecting systems. No hydroureter. The urinary bladder is unremarkable. Stomach/Bowel: No small or large bowel wall thickening or dilatation. Colonic diverticulosis. The appendix is not definitely identified with no inflammatory changes in the right lower quadrant to suggest acute appendicitis. Vasculature/Lymphatics: No abdominal aorta or iliac aneurysm. No active contrast extravasation or pseudoaneurysm. No abdominal, pelvic, inguinal lymphadenopathy. Reproductive: Calcification within the wall of the uterus likely uterine fibroids. Otherwise uterus and bilateral adnexal regions are unremarkable. Other: No simple free fluid ascites. No pneumoperitoneum. No hemoperitoneum. No mesenteric hematoma identified. No organized fluid collection. Musculoskeletal: No significant soft tissue hematoma. No acute pelvic fracture. No spinal fracture. L5-S1 posterolateral surgical hardware fusion. Total left hip arthroplasty partially visualized. Ports and Devices: None. IMPRESSION: 1. No acute traumatic injury to the chest, abdomen, or pelvis. 2. No acute fracture or traumatic malalignment of the thoracic or lumbar spine. 3. Other imaging findings of potential clinical significance: Colonic diverticulosis with no acute diverticulitis. Stable small volume pericardial effusion. Indeterminate persistent irregular hypodensity along the left lateral hepatic lobe-consider outpatient right upper quadrant ultrasound  follow-up. Electronically Signed   By: Iven Finn M.D.   On: 12/08/2022 03:08   CT Head Wo Contrast  Result Date: 12/08/2022 CLINICAL DATA:  Head trauma, moderate-severe; Neck trauma, impaired ROM (Age 53-64y) EXAM: CT HEAD WITHOUT CONTRAST CT CERVICAL SPINE WITHOUT CONTRAST TECHNIQUE: Multidetector CT imaging of the head and cervical spine was performed following the standard protocol without intravenous contrast. Multiplanar CT image reconstructions of the cervical spine were also generated. RADIATION DOSE REDUCTION: This exam was performed according to the departmental dose-optimization program which includes automated exposure control, adjustment of the mA and/or kV according to patient size and/or use of iterative reconstruction technique. COMPARISON:  None Available. FINDINGS: CT HEAD FINDINGS Brain: No evidence of large-territorial acute infarction. No parenchymal hemorrhage. No mass lesion. No extra-axial collection. No mass effect or midline shift. No hydrocephalus. Basilar cisterns are patent. Vascular: No hyperdense vessel. Skull: No acute fracture or focal lesion. Sinuses/Orbits: Paranasal sinuses and mastoid air cells are clear. The orbits are unremarkable. Other: None. CT CERVICAL SPINE FINDINGS Alignment: Reversal of the normal cervical lordosis likely due to positioning. Skull base and vertebrae: Multilevel mild-to-moderate degenerative changes spine most prominent at the C4-C5 level. No acute fracture. No aggressive appearing focal osseous lesion or focal pathologic process. Soft tissues and spinal canal: No prevertebral fluid or swelling. No visible canal hematoma. Upper chest: Unremarkable. Other: None. IMPRESSION: 1. No acute intracranial abnormality. 2. No acute displaced fracture or traumatic listhesis of the cervical spine. Electronically Signed   By: Iven Finn M.D.   On: 12/08/2022 02:43   CT Cervical Spine Wo Contrast  Result Date: 12/08/2022 CLINICAL DATA:  Head trauma,  moderate-severe; Neck trauma, impaired ROM (Age 4-64y) EXAM: CT HEAD WITHOUT CONTRAST CT CERVICAL SPINE WITHOUT CONTRAST TECHNIQUE: Multidetector CT imaging of the head and cervical spine was performed following the standard protocol without intravenous contrast. Multiplanar CT image reconstructions of the cervical spine were also generated. RADIATION DOSE REDUCTION: This exam was performed according to the departmental dose-optimization program which includes automated exposure control, adjustment of the mA and/or kV according to patient size and/or use of iterative reconstruction technique. COMPARISON:  None Available. FINDINGS: CT HEAD FINDINGS Brain: No evidence of large-territorial acute infarction. No parenchymal hemorrhage. No mass lesion. No extra-axial collection. No mass effect or  midline shift. No hydrocephalus. Basilar cisterns are patent. Vascular: No hyperdense vessel. Skull: No acute fracture or focal lesion. Sinuses/Orbits: Paranasal sinuses and mastoid air cells are clear. The orbits are unremarkable. Other: None. CT CERVICAL SPINE FINDINGS Alignment: Reversal of the normal cervical lordosis likely due to positioning. Skull base and vertebrae: Multilevel mild-to-moderate degenerative changes spine most prominent at the C4-C5 level. No acute fracture. No aggressive appearing focal osseous lesion or focal pathologic process. Soft tissues and spinal canal: No prevertebral fluid or swelling. No visible canal hematoma. Upper chest: Unremarkable. Other: None. IMPRESSION: 1. No acute intracranial abnormality. 2. No acute displaced fracture or traumatic listhesis of the cervical spine. Electronically Signed   By: Iven Finn M.D.   On: 12/08/2022 02:43   DG Chest 2 View  Result Date: 12/08/2022 CLINICAL DATA:  Fall with right rib pain. EXAM: CHEST - 2 VIEW COMPARISON:  PA Lat 12/17/2021 FINDINGS: The heart was previously mildly enlarged but is normal in size today. There previously was perihilar  vascular congestion and interstitial edema but no evidence of CHF today. The lungs are clear apart from mild perihilar linear atelectasis. No pleural effusion is seen. There is no visible pneumothorax. There is mild chronic elevation of the right hemidiaphragm. The mediastinum is normally outlined. There is slight thoracic kyphodextroscoliosis with no acute osseous findings. IMPRESSION: No evidence of acute chest disease. Mild perihilar linear atelectasis. Electronically Signed   By: Telford Nab M.D.   On: 12/08/2022 00:41    Procedures Procedures    Medications Ordered in ED Medications  oxyCODONE-acetaminophen (PERCOCET/ROXICET) 5-325 MG per tablet 1 tablet (1 tablet Oral Given 12/08/22 1238)  cyclobenzaprine (FLEXERIL) tablet 5 mg (5 mg Oral Given 12/08/22 1554)  ketorolac (TORADOL) injection 30 mg (30 mg Intramuscular Given 12/08/22 1550)    ED Course/ Medical Decision Making/ A&P                             Medical Decision Making Risk Prescription drug management.   CHERI CAINS 57 y.o. presented today for back pain. Working DDx that I considered at this time includes, but not limited to, vertebral fracture, pelvic fracture, intracranial hemorrhage, epidural/subdural hematoma, CVA/TIA, muscle strain.  Review of prior external notes: 12/07/22 ED Provider notes  Unique Tests and My Interpretation: None  Discussion with Independent Historian: None  Discussion of Management of Tests: None  Risk:    Medium:  - prescription drug management  Risk Stratification Score: None  Staffed with Leanord Asal, DO  R/o DDx: Pelvic/vertebral fracture: CT scan last night were negative and patient has not had any changes in symptoms since she was discharged Intracranial hemorrhage, epidural/subdural hematoma, CVA/TIA: CT was negative from last night and patient denied any changes in symptoms since she has been discharged   Plan: Presented with low back pain and headache after  having a fall last night.  Patient had extensive workup done last night which yielded flank contusion and liver lesion that could be followed up outpatient with CT.  On exam patient appears to be in discomfort but no distress.  Neuroexam was unremarkable as patient was able to fully participate and bear weight.  Patient was tender in the paraspinal muscles as opposed to midline tenderness.  Due to the patient having extensive workup last night we will not pursue any advanced imaging.  Patient wanted a shot for the pain and will receive Toradol along with Flexeril as this might be  related to a muscle strain as well.  In the EMR it states that the patient has an allergy to meloxicam at the patient does not remember what the reaction was.  After speaking with the patient patient states she can take ibuprofen without issue and wanted the Toradol.  Upon home med check it appears patient has a prescription for 40 tramadol that was prescribed on 11/26/2022.  Patient will not be given stronger pain meds at this time as she appears stable and her presentation does not necessitate the need for stronger pain meds.  On recheck patient stated the Toradol and Flexeril were helping with her symptoms.  I suspect this is most likely a muscle strain from her fall.  Patient's vitals are stable at this time patient is able to bear weight and ambulate.  Patient will be discharged and follow-up with her primary care provider and also fill her tramadol prescription.  I will prescribe Flexeril for any muscle strain she might be experiencing.  Patient is stable for discharge at this time.  Patient verbalized agreement to this plan.         Final Clinical Impression(s) / ED Diagnoses Final diagnoses:  Muscle strain  Fall, subsequent encounter  Acute left-sided low back pain without sciatica    Rx / DC Orders ED Discharge Orders          Ordered    cyclobenzaprine (FLEXERIL) 10 MG tablet  2 times daily PRN         12/08/22 1622              Chuck Hint, PA-C 12/08/22 1628    Margette Fast, MD 12/11/22 1031

## 2022-12-08 NOTE — Discharge Instructions (Addendum)
Please fill your tramadol prescription.  Please follow-up with your primary care provider about your recent fall and ER visits.  I have also prescribed for you a muscle relaxer to please take as prescribed.  If symptoms worsen please return to ER.

## 2022-12-09 NOTE — Pre-Procedure Instructions (Signed)
Surgical Instructions    Your procedure is scheduled on Wednesday, February 21st.  Report to [facility] Main Entrance "A" at 05:30 A.M., then check in with the Admitting office.  Call this number if you have problems the morning of surgery:  [phone]  If you have any questions prior to your surgery date call [phone]: Open Monday-Friday 8am-4pm If you experience any cold or flu symptoms such as cough, fever, chills, shortness of breath, etc. between now and your scheduled surgery, please notify us at the above number.     Remember:  Do not eat after midnight the night before your surgery  You may drink clear liquids until 04:30 AM the morning of your surgery.   Clear liquids allowed are: Water, Non-Citrus Juices (without pulp), Carbonated Beverages, Clear Tea, Black Coffee Only (NO MILK, CREAM OR POWDERED CREAMER of any kind), and Gatorade.  Patient Instructions  The night before surgery:  No food after midnight. ONLY clear liquids after midnight  The day of surgery (if you do NOT have diabetes):  Drink ONE (1) Pre-Surgery Clear Ensure by 04:30 AM the morning of surgery. Drink in one sitting. Do not sip.  This drink was given to you during your hospital  pre-op appointment visit.  Nothing else to drink after completing the  Pre-Surgery Clear Ensure.          If you have questions, please contact your surgeon's office.      Take these medicines the morning of surgery with A SIP OF WATER  budesonide-formoterol (SYMBICORT)  cetirizine (ZYRTEC)  esomeprazole (NEXIUM)  fluticasone (FLONASE)  hydrOXYzine (VISTARIL)  lithium  montelukast (SINGULAIR)  oxybutynin (DITROPAN)  PARoxetine (PAXIL-CR)     If needed: albuterol (PROVENTIL) nebulizer albuterol (VENTOLIN HFA)-  cyclobenzaprine (FLEXERIL)  traMADol (ULTRAM)     Follow your surgeon's instructions on when to stop Aspirin.  If no instructions were given by your surgeon then you will need to call the office  to get those instructions.     As of today, STOP taking any Aleve, Naproxen, Ibuprofen, Motrin, Advil, Goody's, BC's, all herbal medications, fish oil, and all vitamins. This includes celecoxib (CELEBREX).                     Do NOT Smoke (Tobacco/Vaping) for 24 hours prior to your procedure.  If you use a CPAP at night, you may bring your mask/headgear for your overnight stay.   Contacts, glasses, piercing's, hearing aid's, dentures or partials may not be worn into surgery, please bring cases for these belongings.    For patients admitted to the hospital, discharge time will be determined by your treatment team.   Patients discharged the day of surgery will not be allowed to drive home, and someone needs to stay with them for 24 hours.  SURGICAL WAITING ROOM VISITATION Patients having surgery or a procedure may have no more than 2 support people in the waiting area - these visitors may rotate.   Children under the age of 84 must have an adult with them who is not the patient. If the patient needs to stay at the hospital during part of their recovery, the visitor guidelines for inpatient rooms apply. Pre-op nurse will coordinate an appropriate time for 1 support person to accompany patient in pre-op.  This support person may not rotate.   Please refer to the [facility] website for the visitor guidelines for Inpatients (after your surgery is over and you are in a regular room).  Special instructions:   [facility]- Preparing For Surgery  Before surgery, you can play an important role. Because skin is not sterile, your skin needs to be as free of germs as possible. You can reduce the number of germs on your skin by washing with CHG (chlorahexidine gluconate) Soap before surgery.  CHG is an antiseptic cleaner which kills germs and bonds with the skin to continue killing germs even after washing.    Oral Hygiene is also important to reduce your risk of infection.  Remember - BRUSH YOUR  TEETH THE MORNING OF SURGERY WITH YOUR REGULAR TOOTHPASTE  Please do not use if you have an allergy to CHG or antibacterial soaps. If your skin becomes reddened/irritated stop using the CHG.  Do not shave (including legs and underarms) for at least 48 hours prior to first CHG shower. It is OK to shave your face.  Please follow these instructions carefully.   Shower the NIGHT BEFORE SURGERY and the MORNING OF SURGERY  If you chose to wash your hair, wash your hair first as usual with your normal shampoo.  After you shampoo, rinse your hair and body thoroughly to remove the shampoo.  Use CHG Soap as you would any other liquid soap. You can apply CHG directly to the skin and wash gently with a scrungie or a clean washcloth.   Apply the CHG Soap to your body ONLY FROM THE NECK DOWN.  Do not use on open wounds or open sores. Avoid contact with your eyes, ears, mouth and genitals (private parts). Wash Face and genitals (private parts)  with your normal soap.   Wash thoroughly, paying special attention to the area where your surgery will be performed.  Thoroughly rinse your body with warm water from the neck down.  DO NOT shower/wash with your normal soap after using and rinsing off the CHG Soap.  Pat yourself dry with a CLEAN TOWEL.  Wear CLEAN PAJAMAS to bed the night before surgery  Place CLEAN SHEETS on your bed the night before your surgery  DO NOT SLEEP WITH PETS.   Day of Surgery: Take a shower with CHG soap. Do not wear jewelry or makeup Do not wear lotions, powders, perfumes, or deodorant. Do not shave 48 hours prior to surgery.   Do not bring valuables to the hospital. [facility] is not responsible for any belongings or valuables. Do not wear nail polish, gel polish, artificial nails, or any other type of covering on natural nails (fingers and toes) If you have artificial nails or gel coating that need to be removed by a nail salon, please have this removed prior to  surgery. Artificial nails or gel coating may interfere with anesthesia's ability to adequately monitor your vital signs. Wear Clean/Comfortable clothing the morning of surgery Remember to brush your teeth WITH YOUR REGULAR TOOTHPASTE.   Please read over the following fact sheets that you were given.    If you received a COVID test during your pre-op visit  it is requested that you wear a mask when out in public, stay away from anyone that may not be feeling well and notify your surgeon if you develop symptoms. If you have been in contact with anyone that has tested positive in the last 10 days please notify you surgeon.

## 2022-12-10 ENCOUNTER — Encounter (HOSPITAL_COMMUNITY): Payer: Self-pay | Admitting: Certified Registered"

## 2022-12-10 ENCOUNTER — Encounter (HOSPITAL_COMMUNITY): Payer: Self-pay | Admitting: Physician Assistant

## 2022-12-10 ENCOUNTER — Encounter (HOSPITAL_COMMUNITY)
Admission: RE | Admit: 2022-12-10 | Discharge: 2022-12-10 | Disposition: A | Discharge status: Home / Self Care | Payer: Medicaid Other | Source: Ambulatory Visit | Attending: Orthopedic Surgery | Admitting: Orthopedic Surgery

## 2022-12-10 DIAGNOSIS — Z01818 Encounter for other preprocedural examination: Secondary | ICD-10-CM | POA: Diagnosis present

## 2022-12-10 NOTE — Progress Notes (Addendum)
PCP - Dr.Edwin Avbuere Cardiologist - pt denies  PPM/ICD - pt denies Device Orders -  Rep Notified -   Chest x-ray - 12/08/22 EKG - 12/08/22 Stress Test - 04/25/16 ECHO - 04/25/16 Cardiac Cath - pt denies  Sleep Study - pt denies CPAP - n/a  Fasting Blood Sugar - pt denies diabetes  Checks Blood Sugar _____ times a day  Last dose of GLP1 agonist-   GLP1 instructions:   Blood Thinner Instructions:pt denies Aspirin Instructions:  ERAS Protcol -yes PRE-SURGERY Ensure given at PAT   COVID TEST- N/A   Anesthesia review: YES. At PAT visit patient c/o nausea and vomiting only. Patient denies fever, diarrhea, chest pain, shortness of breath. Patient states she believes the Janine Limbo she ate about 2 hours ago made her sick. Patient vomit once at home and once when she got to [facility]. Patient also has a dry cough that started today per pt. Karoline Caldwell, PA notified via secure chat. Per Karoline Caldwell, PA I should not do the PAT visit because patient may have a GI virus that has been going around. Jeneen Rinks, [place] also encouraged patient to see her PCP for evaluation before surgery. I explained this to the patient and encouraged patient to call her PCP TODAY to get appointment ASAP. Patient states she will do this. Butch Penny at Dr.Dumonski's office called into my PAT office about this patient and I notified her of patient's symptoms. Butch Penny then called and spoke with Jeneen Rinks, [place]. Butch Penny also states she is unable to edit consent order as requested by Kara,RN yesterday. PAT visit was not completed. I gave patient pre-op instructions with CHG soap and Ensure patient will read over instructions, I then took patient to waiting room, her mother was coming to pick her up. Patient has face mask on during visit here. Butch Penny will call patient tomorrow to check on her.   Patient denies shortness of breath, fever, cough and chest pain at PAT appointment   All instructions explained to the patient, with a verbal  understanding of the material. Patient agrees to go over the instructions while at home for a better understanding. Patient also instructed to self quarantine after being tested for COVID-19. The opportunity to ask questions was provided.

## 2022-12-17 ENCOUNTER — Encounter (HOSPITAL_COMMUNITY): Payer: Self-pay | Admitting: Orthopedic Surgery

## 2022-12-17 NOTE — Progress Notes (Signed)
Anesthesia Chart Review:  56 yo female for staged hardware removal and anterior/posterior fusion of L4-S1 on 12/18/22 and 12/19/22 with Dr. Lynann Bologna. Pertinent history includes HTN, PTSD, Bipolar, Current smoker with associated COPD, prior history of poly substance abuse.   Patient was scheduled for preadmission testing appointment on 12/10/2022, however, this had to be canceled as she experienced nausea and vomiting upon arrival to the hospital.  She states she felt it was due to something she had eaten earlier that day.  She denied any other signs or symptoms of systemic illness.  I did note that she recently had CMP and CBC on 12/08/2022 which showed only mild hyponatremia with sodium 134 and mild hypokalemia potassium 3.4, otherwise unremarkable.  I spoke with the patient again on 12/12/2022 to follow-up and see how she was feeling.  She advised that she did not have any further vomiting after the episode that occurred on 2/13 at the hospital.  She says she was otherwise feeling well.  I also asked her about her recent ED visit on 12/07/2022 for mechanical fall she suffered at home.  Per ED documentation, patient suffered mechanical fall on 2/10 down the stairs of her house.  She initially had a nosebleed and felt dizzy and nauseated.  Evaluation was overall benign, she did have some diffuse musculoskeletal tenderness but no significant acute injuries.  She says she still has soreness from this but denied any severe pain, denied any headache, syncope, presyncope, or other symptoms related to this.  I did encourage her to follow-up with her PCP Dr. Ruthine Dose for follow-up of her recent GI illness as well as her recent fall.    She called me back on 12/17/2022 and stated that she was seen by Dr. Jeanie Cooks on 12/16/2022 for evaluation.  She said that at that time she was diagnosed with an outbreak of shingles under her breast and wrapping around her thorax.  She stated she was prescribed Percocet as well as a  topical medication, but she could not recall what it was.  She said there were otherwise no acute concerns at that visit. Multiple attempts were made to get these records from Dr. Santiago Bur office by myself and by Dr. Lynann Bologna without success. I spoke with the practice manager to request the OV note and it was never sent.  I spoke to Dr. Lynann Bologna. He did call the patient and stated that from her description, the lesion sounded minor and should not interfere with the surgical site. He advised he would like to proceed as planned, pending evaluation of the patient in preop holding on DOS. I notified Geanie Berlin, RN, as the patient will need contact and airborne precautions.  EKG 12/08/22: Sinus rhythm. Rate 89. Low voltage, precordial leads.   TTE 04/25/2016: Left ventricle:  The cavity size was normal. Wall thickness was  normal. Systolic function was normal. The estimated ejection  fraction was in the range of 55% to 60%. Wall motion was normal;  there were no regional wall motion abnormalities. Doppler  parameters are consistent with abnormal left ventricular relaxation  (grade 1 diastolic dysfunction).   Nuclear stress 04/25/2016: There was no ST segment deviation noted during stress. No T wave inversion was noted during stress. The study is normal. The left ventricular ejection fraction is hyperdynamic (>65%). Nuclear stress EF: 72%.   1. No ischemia or infarction by perfusion images.  n 2. Normal EF and wall motion.       Karoline Caldwell, PA-C [facility] Short Stay  Center/Anesthesiology Phone [phone] 12/17/2022 4:54 PM

## 2022-12-18 ENCOUNTER — Inpatient Hospital Stay (HOSPITAL_COMMUNITY): Payer: Medicaid Other

## 2022-12-18 ENCOUNTER — Encounter (HOSPITAL_COMMUNITY)
Admission: RE | Disposition: A | Discharge status: Home / Self Care | Payer: Self-pay | Source: Home / Self Care | Attending: Orthopedic Surgery

## 2022-12-18 ENCOUNTER — Ambulatory Visit (HOSPITAL_COMMUNITY)
Admission: RE | Admit: 2022-12-18 | Discharge: 2022-12-18 | Disposition: A | Discharge status: Home / Self Care | Payer: Medicaid Other | Attending: Orthopedic Surgery | Admitting: Orthopedic Surgery

## 2022-12-18 ENCOUNTER — Other Ambulatory Visit: Payer: Self-pay

## 2022-12-18 ENCOUNTER — Encounter (HOSPITAL_COMMUNITY): Payer: Self-pay | Admitting: Orthopedic Surgery

## 2022-12-18 DIAGNOSIS — G8929 Other chronic pain: Secondary | ICD-10-CM | POA: Insufficient documentation

## 2022-12-18 DIAGNOSIS — J4489 Other specified chronic obstructive pulmonary disease: Secondary | ICD-10-CM | POA: Diagnosis not present

## 2022-12-18 DIAGNOSIS — M5137 Other intervertebral disc degeneration, lumbosacral region: Secondary | ICD-10-CM | POA: Diagnosis not present

## 2022-12-18 DIAGNOSIS — M5136 Other intervertebral disc degeneration, lumbar region: Secondary | ICD-10-CM | POA: Insufficient documentation

## 2022-12-18 DIAGNOSIS — I10 Essential (primary) hypertension: Secondary | ICD-10-CM | POA: Diagnosis present

## 2022-12-18 DIAGNOSIS — M48061 Spinal stenosis, lumbar region without neurogenic claudication: Secondary | ICD-10-CM | POA: Insufficient documentation

## 2022-12-18 DIAGNOSIS — M79604 Pain in right leg: Secondary | ICD-10-CM | POA: Insufficient documentation

## 2022-12-18 DIAGNOSIS — Z6841 Body Mass Index (BMI) 40.0 and over, adult: Secondary | ICD-10-CM | POA: Diagnosis not present

## 2022-12-18 DIAGNOSIS — M4807 Spinal stenosis, lumbosacral region: Secondary | ICD-10-CM | POA: Insufficient documentation

## 2022-12-18 DIAGNOSIS — M79605 Pain in left leg: Secondary | ICD-10-CM | POA: Diagnosis not present

## 2022-12-18 SURGERY — ANTERIOR LATERAL LUMBAR FUSION 2 LEVELS
Anesthesia: General

## 2022-12-18 MED ORDER — ACETAMINOPHEN 500 MG PO TABS
1000.0000 mg | ORAL_TABLET | Freq: Once | ORAL | Status: DC
  Filled 2022-12-18: qty 2

## 2022-12-18 MED ORDER — PROPOFOL 10 MG/ML IV BOLUS
INTRAVENOUS | Status: AC
  Filled 2022-12-18: qty 20

## 2022-12-18 MED ORDER — CHLORHEXIDINE GLUCONATE CLOTH 2 % EX PADS: 6.0000 | MEDICATED_PAD | Freq: Once | CUTANEOUS | Status: DC

## 2022-12-18 MED ORDER — THROMBIN 20000 UNITS EX SOLR
CUTANEOUS | Status: AC
  Filled 2022-12-18: qty 20000

## 2022-12-18 MED ORDER — ORAL CARE MOUTH RINSE: 15.0000 mL | Freq: Once | OROMUCOSAL | Status: DC

## 2022-12-18 MED ORDER — MIDAZOLAM HCL 2 MG/2ML IJ SOLN
INTRAMUSCULAR | Status: AC
  Filled 2022-12-18: qty 2

## 2022-12-18 MED ORDER — CEFAZOLIN SODIUM-DEXTROSE 2-4 GM/100ML-% IV SOLN
2.0000 g | INTRAVENOUS | Status: DC
  Filled 2022-12-18: qty 100

## 2022-12-18 MED ORDER — FENTANYL CITRATE (PF) 250 MCG/5ML IJ SOLN
INTRAMUSCULAR | Status: AC
  Filled 2022-12-18: qty 5

## 2022-12-18 MED ORDER — KETAMINE HCL 50 MG/5ML IJ SOSY
PREFILLED_SYRINGE | INTRAMUSCULAR | Status: AC
  Filled 2022-12-18: qty 5

## 2022-12-18 MED ORDER — BUPIVACAINE-EPINEPHRINE (PF) 0.25% -1:200000 IJ SOLN
INTRAMUSCULAR | Status: AC
  Filled 2022-12-18: qty 30

## 2022-12-18 MED ORDER — ACETAMINOPHEN 500 MG PO TABS: 1000.0000 mg | ORAL_TABLET | Freq: Once | ORAL | Status: DC

## 2022-12-18 MED ORDER — CHLORHEXIDINE GLUCONATE 0.12 % MT SOLN
15.0000 mL | Freq: Once | OROMUCOSAL | Status: DC
  Filled 2022-12-18: qty 15

## 2022-12-18 MED ORDER — LACTATED RINGERS IV SOLN: INTRAVENOUS | Status: DC
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
## 2022-12-18 NOTE — Anesthesia Preprocedure Evaluation (Addendum)
Anesthesia Evaluation    Reviewed: Allergy & Precautions, Patient's Chart, lab work & pertinent test results  Airway Mallampati: III  TM Distance: >3 FB Neck ROM: Full    Dental  (+) Teeth Intact, Dental Advisory Given   Pulmonary shortness of breath and with exertion, asthma , COPD,  COPD inhaler, Current Smoker 13 pack year history    Pulmonary exam normal breath sounds clear to auscultation       Cardiovascular hypertension, Pt. on medications Normal cardiovascular exam Rhythm:Regular Rate:Normal  2017 Normal LV size and systolic function, EF 0000000. Normal RV size and systolic function. No significant valvular abnormalities.     Neuro/Psych Seizures - (related to EtOH abuse), Well Controlled,  PSYCHIATRIC DISORDERS Anxiety Depression Bipolar Disorder Schizophrenia     GI/Hepatic ,GERD  Controlled,,(+)     substance abuse  Hx polysubstance abuse inc cocaine, crack, heroin- last relapse 2021   Endo/Other    Morbid obesityBMI 42  Renal/GU negative Renal ROS  negative genitourinary   Musculoskeletal  (+) Arthritis , Osteoarthritis,  Chronic pain: tramadol   Abdominal  (+) + obese  Peds  Hematology negative hematology ROS (+)   Anesthesia Other Findings   Reproductive/Obstetrics negative OB ROS                             Anesthesia Physical Anesthesia Plan  ASA: 3  Anesthesia Plan: General   Post-op Pain Management: Tylenol PO (pre-op)*   Induction: Intravenous  PONV Risk Score and Plan: 2 and Ondansetron, Dexamethasone, Midazolam and Treatment may vary due to age or medical condition  Airway Management Planned: Oral ETT  Additional Equipment: ClearSight  Intra-op Plan:   Post-operative Plan: Extubation in OR  Informed Consent:   Plan Discussed with:   Anesthesia Plan Comments: (Pt seen in preop with airborne precautions, preop RN as well as CRNA concerned about  patient's nausea and active shingles outbreak. In preop she is diaphoretic and coughing, which she says is a new cough, but now denying nausea. She currently has open wounds diagnosed as shingles under her right breast 2 days ago, has not been started on valtrex, but was started on Abx for concern of superimposed bacterial infection. Her PAT appt was actually cancelled mid-appt d/t active nausea and vomiting, at which point she was sent to her PCP who diagnosed her with shingles.   While the open, weeping wounds are not in the exact surgical field they are still very near it and she is having severe pain at this site. In addition, all staff involved in her multiple day hospital stay would need airborne precautions. Infectious disease, Dr. Linus Salmons, does not believe shingles to be a contraindication to surgery however Dr. Carlis Abbott, who will be providing abdominal exposure, as well as myself do not feel it is in this patient's best interest to proceed with multiple very long procedures today and tomorrow. All 3 physicians present for discussion with patient and patient's mother who both agree that she should be in better condition prior to this long elective procedure.)       Anesthesia Quick Evaluation

## 2022-12-18 NOTE — Progress Notes (Signed)
Updated note:  Subsequent to the decision made and reflected in my last note, Dr. Doroteo Glassman expressed that she was uncomfortable proceeding with Alicia Blake's surgery.  The OR has been made aware.  Patient will be rescheduled for a future date.

## 2022-12-18 NOTE — H&P (Signed)
PREOPERATIVE H&P  Chief Complaint: Low back pain, bilateral leg pain  HPI: Alicia Blake is a 57 y.o. female who presents with ongoing pain in the bilateral legs.  Patient denies any nausea, and states that she does currently feel comfortable.  She was recently diagnosed with shingles, but states that the pain from this has improved.  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, with severe disc degeneration and stenosis at L4-5 and L5-S1  Patient has failed multiple forms of conservative care and continues to have pain (see office notes for additional details regarding the patient's full course of treatment)  Past Medical History:  Diagnosis Date   Asthma    Bipolar affect, depressed ([place])    Bronchitis    Chronic back pain    Closed left ankle fracture    COPD (chronic obstructive pulmonary disease) (HCC)    DJD (degenerative joint disease)    BACK   GERD (gastroesophageal reflux disease)    Hypertension    Pneumonia    Pre-diabetes    dr entered diabetes without complications- no meds does not test sugar at home   PTSD (post-traumatic stress disorder)    Scoliosis    Seizures ([place])    "when drinking" last yrs ago   Shortness of breath dyspnea    exersion   Substance abuse ([place])    crack cocaine, 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, none since   Thyroid disease    UTI (lower urinary tract infection)    Past Surgical History:  Procedure Laterality Date   2 LEFT TOES SURGERY  12/2014   finished only on right side   BACK SURGERY     BALLOON DILATION N/A 12/14/2015   Procedure: BALLOON DILATION;  Surgeon: Teena Irani, MD;  Location: [facility];  Service: Endoscopy;  Laterality: N/A;   COLONOSCOPY WITH PROPOFOL N/A 12/14/2015   Procedure: COLONOSCOPY WITH PROPOFOL;  Surgeon: Teena Irani, MD;  Location: [facility];  Service: Endoscopy;  Laterality: N/A;   ESOPHAGOGASTRODUODENOSCOPY (EGD) WITH PROPOFOL N/A 12/14/2015   Procedure: ESOPHAGOGASTRODUODENOSCOPY (EGD) WITH PROPOFOL;  Surgeon:  Teena Irani, MD;  Location: [facility];  Service: Endoscopy;  Laterality: N/A;   JOINT REPLACEMENT     left hip   LEFT ANKLE SURGERY  MARCH 2016   LUMBAR LAMINECTOMY/DECOMPRESSION MICRODISCECTOMY N/A 06/18/2017   Procedure: LAMINECTOMY AND FORAMINOTOMY LUMBAR FOUR - LUMBAR FIVE;  Surgeon: Ashok Pall, MD;  Location: [place];  Service: Neurosurgery;  Laterality: N/A;  LAMINECTOMY AND FORAMINOTOMY LUMBAR 4- LUMBAR 5   LUMBAR WOUND DEBRIDEMENT N/A 03/10/2020   Procedure: Lumbar Wound Debridement;  Surgeon: Ashok Pall, MD;  Location: [place];  Service: Neurosurgery;  Laterality: N/A;  posterior   NECK SURGERY  AS TEENAGER   STITCHES TO NECK    ORIF ANKLE FRACTURE Left 07/24/2021   Procedure: OPEN REDUCTION INTERNAL FIXATION (ORIF) ANKLE FRACTURE;  Surgeon: Renette Butters, MD;  Location: [place];  Service: Orthopedics;  Laterality: Left;   SURGERY FOR CUT ON STOMACH  TEENAGER   TOTAL HIP ARTHROPLASTY Left 05/14/2016   Procedure: TOTAL HIP ARTHROPLASTY ANTERIOR APPROACH;  Surgeon: Renette Butters, MD;  Location: [place];  Service: Orthopedics;  Laterality: Left;   Social History   Socioeconomic History   Marital status: Single    Spouse name: Not on file   Number of children: Not on file   Years of education: Not on file   Highest education level: Not on file  Occupational History   Not on file  Tobacco Use   Smoking status: Every Day    Packs/day: 0.50    Years: 26.00    Total pack years: 13.00    Types: Cigarettes   Smokeless tobacco: Never  Vaping Use   Vaping Use: Never used  Substance and Sexual Activity   Alcohol use: No    Comment: quit date 01/17/2013   Drug use: No    Types: "Crack" cocaine, Heroin    Comment: none since 03-2020   Sexual activity: Yes    Birth control/protection: None, Post-menopausal  Other Topics Concern   Not on file  Social History Narrative   Not on file   Social Determinants of Health   Financial Resource Strain: Not on file   Food Insecurity: Not on file  Transportation Needs: Not on file  Physical Activity: Not on file  Stress: Not on file  Social Connections: Not on file   Family History  Problem Relation Age of Onset   Hypertension Mother    Hypertension Maternal Grandmother    Hypertension Paternal Grandmother    Diabetes Paternal Grandmother    Hypertension Paternal Grandfather    Diabetes Paternal Grandfather    Alcohol abuse Paternal Aunt    Mental illness Cousin    Heart attack Cousin    Heart attack Maternal Uncle    Allergies  Allergen Reactions   Risperidone And Related Swelling and Other (See Comments)    Facial swelling   Buspirone Swelling   Meloxicam Other (See Comments)    UNABLE TO REMEMBER   Prednisone Other (See Comments)    She reported history of mouth swelling with prednisone   Prior to Admission medications   Medication Sig Start Date End Date Taking? Authorizing Provider  ABILIFY MAINTENA 400 MG PRSY prefilled syringe Inject 400 mg into the muscle every 28 (twenty-eight) days. 06/13/21  Yes [provider]  albuterol (PROVENTIL) (2.5 MG/3ML) 0.083% nebulizer solution Take 3 mLs (2.5 mg total) by nebulization every 6 (six) hours as needed for wheezing or shortness of breath. 12/17/21  Yes Raspet, Erin K, PA-C  albuterol (VENTOLIN HFA) 108 (90 Base) MCG/ACT inhaler Inhale 2 puffs into the lungs every 6 (six) hours as needed for wheezing. 12/17/21  Yes Raspet, Derry Skill, PA-C  aspirin EC 81 MG tablet Take 1 tablet (81 mg total) by mouth 2 (two) times daily. For DVT prophylaxis for 30 days after surgery. Patient taking differently: Take 81 mg by mouth daily. 07/24/21  Yes Gawne, Meghan M, PA-C  budesonide-formoterol (SYMBICORT) 160-4.5 MCG/ACT inhaler Inhale 2 puffs into the lungs in the morning and at bedtime. 11/27/21  Yes Crain, Whitney L, PA  celecoxib (CELEBREX) 200 MG capsule Take 1 capsule (200 mg total) by mouth 2 (two) times daily. 06/23/22  Yes Harris, Abigail, PA-C   cetirizine (ZYRTEC) 10 MG tablet Take 10 mg by mouth daily. 05/30/22  Yes [provider]  esomeprazole (NEXIUM) 40 MG capsule Take 40 mg by mouth daily. 09/22/19  Yes [provider]  fluticasone (FLONASE) 50 MCG/ACT nasal spray Place 2 sprays into both nostrils daily.   Yes [provider]  furosemide (LASIX) 40 MG tablet Take 40 mg by mouth 2 (two) times daily. 10/30/22  Yes [provider]  hydrOXYzine (VISTARIL) 25 MG capsule Take 25 mg by mouth daily. 06/16/19  Yes [provider]  ibuprofen (ADVIL) 600 MG tablet Take 600 mg by mouth every 6 (six) hours as needed for moderate pain.   Yes [provider]  lithium  300 MG tablet Take 300 mg by mouth 2 (two) times daily. 10/07/22  Yes [provider]  losartan (COZAAR) 25 MG tablet Take 25 mg by mouth daily. 09/18/19  Yes Trey Sailors, PA  montelukast (SINGULAIR) 10 MG tablet Take 10 mg by mouth daily. 04/24/22  Yes [provider]  Multiple Vitamin (MULTIVITAMIN) tablet Take 1 tablet by mouth daily.   Yes [provider]  naproxen (NAPROSYN) 500 MG tablet Take 500 mg by mouth 2 (two) times daily. 10/19/22  Yes [provider]  oxybutynin (DITROPAN) 5 MG tablet Take 5 mg by mouth daily. 09/09/22  Yes [provider]  oxyCODONE-acetaminophen (PERCOCET/ROXICET) 5-325 MG tablet Take by mouth every 4 (four) hours as needed for severe pain.   Yes [provider]  PARoxetine (PAXIL-CR) 25 MG 24 hr tablet Take 25 mg by mouth daily. 10/07/22  Yes [provider]  potassium chloride (KLOR-CON) 10 MEQ tablet Take 10 mEq by mouth daily. 04/24/22  Yes [provider]  QUEtiapine (SEROQUEL) 300 MG tablet Take 300 mg by mouth at bedtime. 10/29/22  Yes [provider]  traMADol (ULTRAM) 50 MG tablet Take 1 tablet (50 mg total) by mouth every 6 (six) hours as needed. Patient taking differently: Take 50 mg by mouth every 6 (six)  hours as needed for moderate pain or severe pain. 10/02/22  Yes Dorie Rank, MD  acetaminophen (TYLENOL) 500 MG tablet Take 2 tablets (1,000 mg total) by mouth every 8 (eight) hours as needed for mild pain or moderate pain. Patient not taking: Reported on 12/08/2022 07/24/21   Britt Bottom, PA-C  cyclobenzaprine (FLEXERIL) 10 MG tablet Take 1 tablet (10 mg total) by mouth 2 (two) times daily as needed for muscle spasms. 12/08/22   Chuck Hint, PA-C  doxycycline (VIBRAMYCIN) 100 MG capsule Take 1 capsule (100 mg total) by mouth 2 (two) times daily. 12/17/21   Raspet, Derry Skill, PA-C  HYDROcodone-acetaminophen (NORCO) 10-325 MG tablet Take 1 tablet by mouth every 6 (six) hours as needed for severe pain. Do not take more than 6 tablets in a 24 hour period. Patient not taking: Reported on 11/19/2022 07/24/21   Britt Bottom, PA-C  lidocaine (LIDODERM) 5 % Place 1 patch onto the skin daily. Remove & Discard patch within 12 hours or as directed by MD Patient not taking: Reported on 12/08/2022 10/02/22   Dorie Rank, MD  methylPREDNISolone (MEDROL DOSEPAK) 4 MG TBPK tablet Take one dose pack per package instructions Patient not taking: Reported on 11/19/2022 12/17/21   Raspet, Derry Skill, PA-C  naproxen (NAPROSYN) 375 MG tablet Take 1 tablet (375 mg total) by mouth 2 (two) times daily. Patient not taking: Reported on 12/08/2022 10/02/22   Dorie Rank, MD  ondansetron (ZOFRAN ODT) 4 MG disintegrating tablet Take 1 tablet (4 mg total) by mouth every 8 (eight) hours as needed for nausea or vomiting. Patient not taking: Reported on 11/19/2022 07/24/21   Britt Bottom, PA-C     All other systems have been reviewed and were otherwise negative with the exception of those mentioned in the HPI and as above.  Physical Exam: Vitals:   12/18/22 0707  BP: 130/72  Pulse: 89  Resp: 18  Temp: 97.7 F (36.5 C)  SpO2: 95%    Body mass index is 41.8 kg/m.  General: Alert, no acute distress Cardiovascular: No pedal  edema Respiratory: No cyanosis, no use of accessory musculature Skin: No lesions in the area of chief  complaint Neurologic: Sensation intact distally Psychiatric: Patient is competent for consent with normal mood and affect Lymphatic: No axillary or cervical lymphadenopathy  Patient appears comfortable.  She does have lesions consistent with shingles immediately beneath her right breast, extending into the right axillary area, and upper thoracic region.  These lesions are well away from the planned incisions.   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  Her lesions just beneath her right breast were evaluated by me and Dr. Carlis Abbott.  These do extend into the upper thoracic region.  These lesions are well away from the planned incisions.  I did discuss the patient's situation with infectious disease, Dr. Talbot Grumbling.  Dr. Linus Salmons did not feel that there were any contraindications to proceed with surgery, although he did recommend airborne and contact precautions throughout the patient's hospital stay.  Also of note, at the time of my evaluation with the patient, she denied any nausea or vomiting.  After discussing the patient's situation with Dr. Linus Salmons. Dr. Carlis Abbott, and her anesthesiologist, Dr. Doroteo Glassman, we will proceed with stage 1 of her 2 staged procedure.    Norva Karvonen, MD 12/18/2022 8:27 AM

## 2022-12-18 NOTE — Progress Notes (Signed)
After speaking with anesthesia, Dr. Lynann Bologna, and Dr. Carlis Abbott, patient expressed her concerns to this nurse about proceeding with surgery.  Patient stated that she believes the shingles have been making her nauseous and causing her to throw up.  Patient also stated that she wanted the shingles to dry up before she has surgery.  Nurse encouraged patient to reach back out to her PCP.  Patient in agreement and stated she will contact her PCP today.    Patient dressed and taken to main entrance via wheelchair.

## 2022-12-18 NOTE — Progress Notes (Signed)
Pt seen in preop with airborne precautions, preop RN as well as CRNA concerned about patient's nausea and active shingles outbreak. In preop she is diaphoretic and coughing, which she says is a new cough, but now denying nausea. She currently has open wounds diagnosed as shingles under her right breast 2 days ago, has not been started on valtrex, but was started on Abx for concern of superimposed bacterial infection. Her PAT appt was actually cancelled mid-appt d/t active nausea and vomiting, at which point she was sent to her PCP who diagnosed her with shingles.   While the open, weeping wounds are not in the exact surgical field they are still very near it and she is having severe pain at this site. In addition, all staff involved in her multiple day hospital stay would need airborne precautions. Infectious disease, Dr. Linus Salmons, does not believe shingles to be a contraindication to surgery however Dr. Carlis Abbott, who will be providing abdominal exposure, as well as myself do not feel it is in this patient's best interest to proceed with multiple long procedures today and tomorrow. All 3 physicians present for discussion with patient and patient's mother who both agree that she should be in better condition prior to this long elective procedure.

## 2022-12-19 ENCOUNTER — Inpatient Hospital Stay (HOSPITAL_COMMUNITY): Admission: RE | Admit: 2022-12-19 | Payer: Medicaid Other | Source: Ambulatory Visit | Admitting: Orthopedic Surgery

## 2022-12-19 SURGERY — POSTERIOR LUMBAR FUSION 2 LEVEL
Anesthesia: General

## 2023-02-06 ENCOUNTER — Other Ambulatory Visit: Payer: Self-pay | Admitting: Orthopedic Surgery

## 2023-02-25 ENCOUNTER — Emergency Department (HOSPITAL_COMMUNITY)
Admission: EM | Admit: 2023-02-25 | Discharge: 2023-02-25 | Disposition: A | Discharge status: Home / Self Care | Payer: Medicaid Other | Attending: Emergency Medicine | Admitting: Emergency Medicine

## 2023-02-25 ENCOUNTER — Other Ambulatory Visit: Payer: Self-pay

## 2023-02-25 ENCOUNTER — Emergency Department (HOSPITAL_COMMUNITY): Payer: Medicaid Other

## 2023-02-25 ENCOUNTER — Encounter (HOSPITAL_COMMUNITY): Payer: Self-pay

## 2023-02-25 DIAGNOSIS — I10 Essential (primary) hypertension: Secondary | ICD-10-CM | POA: Insufficient documentation

## 2023-02-25 DIAGNOSIS — J449 Chronic obstructive pulmonary disease, unspecified: Secondary | ICD-10-CM | POA: Insufficient documentation

## 2023-02-25 DIAGNOSIS — Z7982 Long term (current) use of aspirin: Secondary | ICD-10-CM | POA: Diagnosis not present

## 2023-02-25 DIAGNOSIS — M546 Pain in thoracic spine: Secondary | ICD-10-CM | POA: Insufficient documentation

## 2023-02-25 DIAGNOSIS — M542 Cervicalgia: Secondary | ICD-10-CM | POA: Diagnosis present

## 2023-02-25 DIAGNOSIS — X58XXXA Exposure to other specified factors, initial encounter: Secondary | ICD-10-CM | POA: Insufficient documentation

## 2023-02-25 DIAGNOSIS — M25512 Pain in left shoulder: Secondary | ICD-10-CM | POA: Diagnosis not present

## 2023-02-25 DIAGNOSIS — S161XXA Strain of muscle, fascia and tendon at neck level, initial encounter: Secondary | ICD-10-CM | POA: Insufficient documentation

## 2023-02-25 MED ORDER — TRAMADOL HCL 50 MG PO TABS: 50.0000 mg | ORAL_TABLET | Freq: Four times a day (QID) | ORAL | 0 refills | Status: DC | PRN

## 2023-02-25 MED ORDER — LIDOCAINE 5 % EX PTCH: 1.0000 | MEDICATED_PATCH | CUTANEOUS | 0 refills | Status: DC

## 2023-02-25 NOTE — ED Triage Notes (Signed)
Left shoulder, upper back, and neck pain for 4 weeks. Pt states left arm will tingle at times. Pt has surgery scheduled the end of May for a lumbar decompression. Pt states she has not been evaluated for this pain yet.

## 2023-02-25 NOTE — Discharge Instructions (Signed)
Take the medications as prescribed to help with your pain.  Follow-up with your spine doctor for further evaluation

## 2023-02-25 NOTE — ED Provider Notes (Signed)
Alicia Blake Provider Note   CSN: [id number] Arrival date & time: 02/25/23  1616     History  Chief Complaint  Patient presents with   Neck Pain   Back Pain    Alicia Blake is a 57 y.o. female.   Neck Pain Back Pain    Patient has a history of hypertension bronchitis GERD, thyroid, PTSD, chronic back pain, degenerative disc disease, COPD, asthma, seizures, substance use disorder.  Patient states she has been having trouble with pain in her left neck trapezius region going into her shoulder.  It started about 4 weeks ago.  Patient has tried multiple medicines including NSAIDs and muscle relaxants without much relief.  Patient does have history of lumbar disc disease and is scheduled for surgery the end of May.  Patient has not seen anyone for the symptoms but because of persistent wanted to get it checked out.  Home Medications Prior to Admission medications   Medication Sig Start Date End Date Taking? Authorizing Provider  traMADol (ULTRAM) 50 MG tablet Take 1 tablet (50 mg total) by mouth every 6 (six) hours as needed. 02/25/23  Yes Linwood Dibbles, MD  ABILIFY MAINTENA 400 MG PRSY prefilled syringe Inject 400 mg into the muscle every 28 (twenty-eight) days. 06/13/21   [provider]  acetaminophen (TYLENOL) 500 MG tablet Take 2 tablets (1,000 mg total) by mouth every 8 (eight) hours as needed for mild pain or moderate pain. Patient not taking: Reported on 12/08/2022 07/24/21   Jenne Pane, PA-C  albuterol (PROVENTIL) (2.5 MG/3ML) 0.083% nebulizer solution Take 3 mLs (2.5 mg total) by nebulization every 6 (six) hours as needed for wheezing or shortness of breath. 12/17/21   Raspet, Noberto Retort, PA-C  albuterol (VENTOLIN HFA) 108 (90 Base) MCG/ACT inhaler Inhale 2 puffs into the lungs every 6 (six) hours as needed for wheezing. 12/17/21   Raspet, Noberto Retort, PA-C  aspirin EC 81 MG tablet Take 1 tablet (81 mg total) by mouth 2 (two) times  daily. For DVT prophylaxis for 30 days after surgery. Patient taking differently: Take 81 mg by mouth daily. 07/24/21   Jenne Pane, PA-C  budesonide-formoterol (SYMBICORT) 160-4.5 MCG/ACT inhaler Inhale 2 puffs into the lungs in the morning and at bedtime. 11/27/21   Crain, Whitney L, PA  celecoxib (CELEBREX) 200 MG capsule Take 1 capsule (200 mg total) by mouth 2 (two) times daily. 06/23/22   Arthor Captain, PA-C  cetirizine (ZYRTEC) 10 MG tablet Take 10 mg by mouth daily. 05/30/22   [provider]  cyclobenzaprine (FLEXERIL) 10 MG tablet Take 1 tablet (10 mg total) by mouth 2 (two) times daily as needed for muscle spasms. 12/08/22   Netta Corrigan, PA-C  doxycycline (VIBRAMYCIN) 100 MG capsule Take 1 capsule (100 mg total) by mouth 2 (two) times daily. 12/17/21   Raspet, Noberto Retort, PA-C  esomeprazole (NEXIUM) 40 MG capsule Take 40 mg by mouth daily. 09/22/19   [provider]  fluticasone (FLONASE) 50 MCG/ACT nasal spray Place 2 sprays into both nostrils daily.    [provider]  furosemide (LASIX) 40 MG tablet Take 40 mg by mouth 2 (two) times daily. 10/30/22   [provider]  HYDROcodone-acetaminophen (NORCO) 10-325 MG tablet Take 1 tablet by mouth every 6 (six) hours as needed for severe pain. Do not take more than 6 tablets in a 24 hour period. Patient not taking: Reported on 11/19/2022 07/24/21   Levester Fresh  M, PA-C  hydrOXYzine (VISTARIL) 25 MG capsule Take 25 mg by mouth daily. 06/16/19   [provider]  ibuprofen (ADVIL) 600 MG tablet Take 600 mg by mouth every 6 (six) hours as needed for moderate pain.    [provider]  lidocaine (LIDODERM) 5 % Place 1 patch onto the skin daily. Remove & Discard patch within 12 hours or as directed by MD 02/25/23   Linwood Dibbles, MD  lithium 300 MG tablet Take 300 mg by mouth 2 (two) times daily. 10/07/22   [provider]  losartan (COZAAR) 25 MG tablet Take 25 mg by mouth daily. 09/18/19    Norm Salt, PA  methylPREDNISolone (MEDROL DOSEPAK) 4 MG TBPK tablet Take one dose pack per package instructions Patient not taking: Reported on 11/19/2022 12/17/21   Raspet, Erin K, PA-C  montelukast (SINGULAIR) 10 MG tablet Take 10 mg by mouth daily. 04/24/22   [provider]  Multiple Vitamin (MULTIVITAMIN) tablet Take 1 tablet by mouth daily.    [provider]  naproxen (NAPROSYN) 375 MG tablet Take 1 tablet (375 mg total) by mouth 2 (two) times daily. Patient not taking: Reported on 12/08/2022 10/02/22   Linwood Dibbles, MD  naproxen (NAPROSYN) 500 MG tablet Take 500 mg by mouth 2 (two) times daily. 10/19/22   [provider]  ondansetron (ZOFRAN ODT) 4 MG disintegrating tablet Take 1 tablet (4 mg total) by mouth every 8 (eight) hours as needed for nausea or vomiting. Patient not taking: Reported on 11/19/2022 07/24/21   Jenne Pane, PA-C  oxybutynin (DITROPAN) 5 MG tablet Take 5 mg by mouth daily. 09/09/22   [provider]  oxyCODONE-acetaminophen (PERCOCET/ROXICET) 5-325 MG tablet Take by mouth every 4 (four) hours as needed for severe pain.    [provider]  PARoxetine (PAXIL-CR) 25 MG 24 hr tablet Take 25 mg by mouth daily. 10/07/22   [provider]  potassium chloride (KLOR-CON) 10 MEQ tablet Take 10 mEq by mouth daily. 04/24/22   [provider]  QUEtiapine (SEROQUEL) 300 MG tablet Take 300 mg by mouth at bedtime. 10/29/22   [provider]      Allergies    Risperidone and related    Review of Systems   Review of Systems  Musculoskeletal:  Positive for back pain and neck pain.    Physical Exam Updated Vital Signs BP (!) 179/98   Pulse (!) 101   Temp 98.2 F (36.8 C) (Oral)   Resp 19   Ht 1.676 m (5\' 6" )   Wt 117.5 kg   LMP 12/09/2015 Comment: very irregular  SpO2 100%   BMI 41.80 kg/m  Physical Exam Vitals and nursing note reviewed.  Constitutional:      General: She is not in acute  distress.    Appearance: She is well-developed. She is not diaphoretic.  HENT:     Head: Normocephalic and atraumatic.     Right Ear: External ear normal.     Left Ear: External ear normal.  Eyes:     General: No scleral icterus.       Right eye: No discharge.        Left eye: No discharge.     Conjunctiva/sclera: Conjunctivae normal.  Neck:     Trachea: No tracheal deviation.  Cardiovascular:     Rate and Rhythm: Normal rate.  Pulmonary:     Effort: Pulmonary effort is normal. No respiratory distress.     Breath sounds: No stridor.  Abdominal:     General: There is no distension.  Musculoskeletal:        General: No swelling or deformity.     Cervical back: Neck supple.     Comments: Mild tenderness palpation left paraspinal region  Skin:    General: Skin is warm and dry.     Findings: No rash.  Neurological:     Mental Status: She is alert. Mental status is at baseline.     Cranial Nerves: No dysarthria or facial asymmetry.     Sensory: No sensory deficit.     Motor: No weakness or seizure activity.     ED Results / Procedures / Treatments   Labs (all labs ordered are listed, but only abnormal results are displayed) Labs Reviewed - No data to display  EKG None  Radiology DG Cervical Spine Complete  Result Date: 02/25/2023 CLINICAL DATA:  Left shoulder, upper back and neck pain x4 weeks. EXAM: CERVICAL SPINE - COMPLETE 4+ VIEW COMPARISON:  None Available. FINDINGS: There is no evidence of cervical spine fracture or prevertebral soft tissue swelling. Alignment is normal. Mild multilevel endplate sclerosis is seen with associated mild anterior osteophyte formation noted at the levels of C4-C5 and C5-C6. Moderate severity intervertebral disc space narrowing is also seen at C4-C5 and C5-C6. Bilateral multilevel neural foramina lamina an is noted. No other significant bone abnormalities are identified. IMPRESSION: Multilevel degenerative changes, most prominent at C4-C5 and  C5-C6. Electronically Signed   By: Aram Candela M.D.   On: 02/25/2023 17:37    Procedures Procedures    Medications Ordered in ED Medications - No data to display  ED Course/ Medical Decision Making/ A&P Clinical Course as of 02/25/23 1842  Tue Feb 25, 2023  1817 Neck x-rays do show multilevel degenerative changes [JK]    Clinical Course User Index [JK] Linwood Dibbles, MD                             Medical Decision Making Problems Addressed: Acute strain of neck muscle, initial encounter: acute illness or injury that poses a threat to life or bodily functions  Amount and/or Complexity of Data Reviewed Radiology: ordered and independent interpretation performed.  Risk Prescription drug management.   Presented to ED for evaluation of upper neck and back pain.  Patient has symptoms suggestive of cervical radiculopathy.  She has no acute neurologic deficits fortunately.  Normal strength normal sensation.  X-rays do show degenerative changes.  Patient is already seeing a spine surgeon for lower lumbar degenerative disc disease.  Patient has taken Ultram in the past.  Will also give a prescription for Lidoderm.  Recommend outpatient follow-up with her neurosurgeon.  Incidental hypertension noted.  Recommend follow-up with PCP.        Final Clinical Impression(s) / ED Diagnoses Final diagnoses:  Acute strain of neck muscle, initial encounter    Rx / DC Orders ED Discharge Orders          Ordered    lidocaine (LIDODERM) 5 %  Every 24 hours        02/25/23 1842    traMADol (ULTRAM) 50 MG tablet  Every 6 hours PRN        02/25/23 1842              Linwood Dibbles, MD 02/25/23 1844

## 2023-02-27 DIAGNOSIS — R32 Unspecified urinary incontinence: Secondary | ICD-10-CM | POA: Diagnosis not present

## 2023-02-27 DIAGNOSIS — N3289 Other specified disorders of bladder: Secondary | ICD-10-CM | POA: Diagnosis not present

## 2023-03-06 ENCOUNTER — Other Ambulatory Visit: Payer: Self-pay

## 2023-03-07 DIAGNOSIS — M542 Cervicalgia: Secondary | ICD-10-CM | POA: Diagnosis not present

## 2023-03-16 DIAGNOSIS — M47812 Spondylosis without myelopathy or radiculopathy, cervical region: Secondary | ICD-10-CM | POA: Diagnosis not present

## 2023-03-16 DIAGNOSIS — M4317 Spondylolisthesis, lumbosacral region: Secondary | ICD-10-CM | POA: Diagnosis not present

## 2023-03-16 DIAGNOSIS — M47816 Spondylosis without myelopathy or radiculopathy, lumbar region: Secondary | ICD-10-CM | POA: Diagnosis not present

## 2023-03-17 NOTE — Progress Notes (Signed)
Surgical Instructions    Your procedure is scheduled on 03/26/23.  Report to [facility] Main Entrance "A" at 5:30 A.M., then check in with the Admitting office.  Call this number if you have problems the morning of surgery:  [phone]   If you have any questions prior to your surgery date call [phone]: Open Monday-Friday 8am-4pm If you experience any cold or flu symptoms such as cough, fever, chills, shortness of breath, etc. between now and your scheduled surgery, please notify us at the above number     Remember:  Do not eat after midnight the night before your surgery  You may drink clear liquids until 4:30am the morning of your surgery.   Clear liquids allowed are: Water, Non-Citrus Juices (without pulp), Carbonated Beverages, Clear Tea, Black Coffee ONLY (NO MILK, CREAM OR POWDERED CREAMER of any kind), and Gatorade  The night before surgery:  No food after midnight. ONLY clear liquids after midnight  The day of surgery (if you do NOT have diabetes):  Drink ONE (1) Pre-Surgery Clear Ensure by 4:30am the morning of surgery. Drink in one sitting. Do not sip.  This drink was given to you during your hospital  pre-op appointment visit.  Nothing else to drink after completing the  Pre-Surgery Clear Ensure.         If you have questions, please contact your surgeon's office.     Take these medicines the morning of surgery with A SIP OF WATER:  acetaminophen (TYLENOL) if needed albuterol (PROVENTIL) (2.5 MG/3ML) 0.083% nebulizer solution if needed albuterol (VENTOLIN HFA) 108 (90 Base) MCG/ACT inhaler if needed benztropine (COGENTIN)  budesonide-formoterol (SYMBICORT)  cetirizine (ZYRTEC)  esomeprazole (NEXIUM)  fluticasone (FLONASE) if needed hydrOXYzine (VISTARIL)  lithium  montelukast (SINGULAIR)  oxybutynin (DITROPAN-XL)  PARoxetine (PAXIL-CR)  tiZANidine (ZANAFLEX) if needed  As of today, STOP taking any Aleve, Naproxen, Ibuprofen, Motrin, Advil, Goody's,  BC's, all herbal medications, fish oil, and all vitamins. FOLLOW YOUR SURGEON'S INSTRUCTIONS FOR WHEN TO STOP ASPIRIN. IF NO INSTRUCTIONS WERE GIVEN, YOU MUST CALL THE SURGEON'S OFFICE IMMEDIATELY .            Do not wear jewelry or makeup. Do not wear lotions, powders, perfumes/cologne or deodorant. Do not shave 48 hours prior to surgery.  Men may shave face and neck. Do not bring valuables to the hospital. Do not wear nail polish, gel polish, artificial nails, or any other type of covering on natural nails (fingers and toes) If you have artificial nails or gel coating that need to be removed by a nail salon, please have this removed prior to surgery. Artificial nails or gel coating may interfere with anesthesia's ability to adequately monitor your vital signs.  [facility] is not responsible for any belongings or valuables.    Do NOT Smoke (Tobacco/Vaping)  24 hours prior to your procedure  If you use a CPAP at night, you may bring your mask for your overnight stay.   Contacts, glasses, hearing aids, dentures or partials may not be worn into surgery, please bring cases for these belongings   For patients admitted to the hospital, discharge time will be determined by your treatment team.   Patients discharged the day of surgery will not be allowed to drive home, and someone needs to stay with them for 24 hours.   SURGICAL WAITING ROOM VISITATION Patients having surgery or a procedure may have no more than 2 support people in the waiting area - these visitors may rotate.  Children under the age of 60 must have an adult with them who is not the patient. If the patient needs to stay at the hospital during part of their recovery, the visitor guidelines for inpatient rooms apply. Pre-op nurse will coordinate an appropriate time for 1 support person to accompany patient in pre-op.  This support person may not rotate.   Please refer to  [email or web address] for the visitor guidelines for Inpatients (after your surgery is over and you are in a regular room).    Special instructions:    Oral Hygiene is also important to reduce your risk of infection.  Remember - BRUSH YOUR TEETH THE MORNING OF SURGERY WITH YOUR REGULAR TOOTHPASTE    Pre-operative 5 CHG Bath Instructions   You can play a key role in reducing the risk of infection after surgery. Your skin needs to be as free of germs as possible. You can reduce the number of germs on your skin by washing with CHG (chlorhexidine gluconate) soap before surgery. CHG is an antiseptic soap that kills germs and continues to kill germs even after washing.   DO NOT use if you have an allergy to chlorhexidine/CHG or antibacterial soaps. If your skin becomes reddened or irritated, stop using the CHG and notify one of our RNs at [phone].   Please shower with the CHG soap starting 4 days before surgery using the following schedule:     Please keep in mind the following:  DO NOT shave, including legs and underarms, starting the day of your first shower.   You may shave your face at any point before/day of surgery.  Place clean sheets on your bed the day you start using CHG soap. Use a clean washcloth (not used since being washed) for each shower. DO NOT sleep with pets once you start using the CHG.   CHG Shower Instructions:  If you choose to wash your hair and private area, wash first with your normal shampoo/soap.  After you use shampoo/soap, rinse your hair and body thoroughly to remove shampoo/soap residue.  Turn the water OFF and apply about 3 tablespoons (45 ml) of CHG soap to a CLEAN washcloth.  Apply CHG soap ONLY FROM YOUR NECK DOWN TO YOUR TOES (washing for 3-5 minutes)  DO NOT use CHG soap on face, private areas, open wounds, or sores.  Pay special attention to the area where your surgery is being performed.  If you  are having back surgery, having someone wash your back for you may be helpful. Wait 2 minutes after CHG soap is applied, then you may rinse off the CHG soap.  Pat dry with a clean towel  Put on clean clothes/pajamas   If you choose to wear lotion, please use ONLY the CHG-compatible lotions on the back of this paper.     Additional instructions for the day of surgery: DO NOT APPLY any lotions, deodorants, cologne, or perfumes.   Put on clean/comfortable clothes.  Brush your teeth.  Ask your nurse before applying any prescription medications to the skin.      CHG Compatible Lotions   Aveeno Moisturizing lotion  Cetaphil Moisturizing Cream  Cetaphil Moisturizing Lotion  Clairol Herbal Essence Moisturizing Lotion, Dry Skin  Clairol Herbal Essence Moisturizing Lotion, Extra Dry Skin  Clairol Herbal Essence Moisturizing Lotion, Normal Skin  Curel Age Defying Therapeutic Moisturizing Lotion with Alpha Hydroxy  Curel Extreme Care Body Lotion  Curel Soothing Hands Moisturizing Hand Lotion  Curel Therapeutic Moisturizing Cream, Fragrance-Free  Curel Therapeutic  Moisturizing Lotion, Fragrance-Free  Curel Therapeutic Moisturizing Lotion, Original Formula  Eucerin Daily Replenishing Lotion  Eucerin Dry Skin Therapy Plus Alpha Hydroxy Crme  Eucerin Dry Skin Therapy Plus Alpha Hydroxy Lotion  Eucerin Original Crme  Eucerin Original Lotion  Eucerin Plus Crme Eucerin Plus Lotion  Eucerin TriLipid Replenishing Lotion  Keri Anti-Bacterial Hand Lotion  Keri Deep Conditioning Original Lotion Dry Skin Formula Softly Scented  Keri Deep Conditioning Original Lotion, Fragrance Free Sensitive Skin Formula  Keri Lotion Fast Absorbing Fragrance Free Sensitive Skin Formula  Keri Lotion Fast Absorbing Softly Scented Dry Skin Formula  Keri Original Lotion  Keri Skin Renewal Lotion Keri Silky Smooth Lotion  Keri Silky Smooth Sensitive Skin Lotion  Nivea Body Creamy Conditioning Oil  Nivea Body Extra  Enriched Lotion  Nivea Body Original Lotion  Nivea Body Sheer Moisturizing Lotion Nivea Crme  Nivea Skin Firming Lotion  NutraDerm 30 Skin Lotion  NutraDerm Skin Lotion  NutraDerm Therapeutic Skin Cream  NutraDerm Therapeutic Skin Lotion  ProShield Protective Hand Cream  Provon moisturizing lotion   If you received a COVID test during your pre-op visit, it is requested that you wear a mask when out in public, stay away from anyone that may not be feeling well, and notify your surgeon if you develop symptoms. If you have been in contact with anyone that has tested positive in the last 10 days, please notify your surgeon.    Please read over the following fact sheets that you were given.

## 2023-03-18 ENCOUNTER — Encounter (HOSPITAL_COMMUNITY): Payer: Self-pay

## 2023-03-18 ENCOUNTER — Other Ambulatory Visit: Payer: Self-pay

## 2023-03-18 ENCOUNTER — Encounter (HOSPITAL_COMMUNITY)
Admission: RE | Admit: 2023-03-18 | Discharge: 2023-03-18 | Disposition: A | Discharge status: Home / Self Care | Payer: Medicaid Other | Source: Ambulatory Visit | Attending: Orthopedic Surgery | Admitting: Orthopedic Surgery

## 2023-03-18 VITALS — BP 124/74 | HR 95 | Temp 98.0°F | Resp 18 | Ht 66.0 in | Wt 239.5 lb

## 2023-03-18 DIAGNOSIS — Z01818 Encounter for other preprocedural examination: Secondary | ICD-10-CM

## 2023-03-18 DIAGNOSIS — Z01812 Encounter for preprocedural laboratory examination: Secondary | ICD-10-CM | POA: Insufficient documentation

## 2023-03-18 LAB — BASIC METABOLIC PANEL
Anion gap: 8 (ref 5–15)
BUN: 10 mg/dL (ref 6–20)
CO2: 29 mmol/L (ref 22–32)
Calcium: 9.1 mg/dL (ref 8.9–10.3)
Chloride: 98 mmol/L (ref 98–111)
Creatinine, Ser: 0.95 mg/dL (ref 0.44–1.00)
GFR, Estimated: >60 mL/min (ref >60)
Glucose, Bld: 97 mg/dL (ref 70–99)
Potassium: 3.4 mmol/L — ABNORMAL LOW (ref 3.5–5.1)
Sodium: 135 mmol/L (ref 135–145)

## 2023-03-18 LAB — TYPE AND SCREEN
ABO/RH(D): O POS
Antibody Screen: NEGATIVE

## 2023-03-18 LAB — CBC
HCT: 44.5 % (ref 36.0–46.0)
Hemoglobin: 14.2 g/dL (ref 12.0–15.0)
MCH: 29 pg (ref 26.0–34.0)
MCHC: 31.9 g/dL (ref 30.0–36.0)
MCV: 91 fL (ref 80.0–100.0)
Platelets: 253 10*3/uL (ref 150–400)
RBC: 4.89 MIL/uL (ref 3.87–5.11)
RDW: 15.1 % (ref 11.5–15.5)
WBC: 12.7 10*3/uL — ABNORMAL HIGH (ref 4.0–10.5)
nRBC: 0 % (ref 0.0–0.2)

## 2023-03-18 LAB — SURGICAL PCR SCREEN
MRSA, PCR: NEGATIVE
Staphylococcus aureus: NEGATIVE

## 2023-03-18 NOTE — Progress Notes (Signed)
PCP - Fleet Contras Cardiologist - denies  PPM/ICD - denies Device Orders -  Rep Notified -   Chest x-ray - 12/08/22 EKG - 12/08/22 Stress Test - 04/25/16 ECHO - 04/25/16 Cardiac Cath - denies  Sleep Study - denies CPAP - no  Fasting Blood Sugar - na Checks Blood Sugar _____ times a day  Last dose of GLP1 agonist-  na GLP1 instructions:   Blood Thinner Instructions:na Aspirin Instructions:pt states she will call Dr. Marshell Levan office  ERAS Protcol -clear liquids until 0430 PRE-SURGERY Ensure or G2- Ensure  COVID TEST- no   Anesthesia review: no  Patient denies shortness of breath, fever, cough and chest pain at PAT appointment   All instructions explained to the patient, with a verbal understanding of the material. Patient agrees to go over the instructions while at home for a better understanding. Patient also instructed to wear a mask when out in public prior to surgery.  The opportunity to ask questions was provided.

## 2023-03-25 NOTE — Anesthesia Preprocedure Evaluation (Signed)
Anesthesia Evaluation  Patient identified by MRN, date of birth, ID band Patient awake    Reviewed: Allergy & Precautions, NPO status , Patient's Chart, lab work & pertinent test results  History of Anesthesia Complications Negative for: history of anesthetic complications  Airway Mallampati: II  TM Distance: >3 FB Neck ROM: Full    Dental  (+) Dental Advisory Given   Pulmonary asthma , COPD,  COPD inhaler, Current SmokerPatient did not abstain from smoking.   Pulmonary exam normal        Cardiovascular hypertension, Pt. on medications Normal cardiovascular exam     Neuro/Psych Seizures -, Well Controlled,  PSYCHIATRIC DISORDERS Anxiety Depression Bipolar Disorder    Schizoaffective d/o  B/l hearing loss     GI/Hepatic ,GERD  Medicated and Controlled,,(+)     substance abuse  alcohol use, cocaine use and IV drug use  Endo/Other    Morbid obesity  Renal/GU negative Renal ROS     Musculoskeletal  (+) Arthritis ,  narcotic dependent Scoliosis    Abdominal  (+) + obese  Peds  Hematology negative hematology ROS (+)   Anesthesia Other Findings   Reproductive/Obstetrics                              Anesthesia Physical Anesthesia Plan  ASA: 3  Anesthesia Plan: General   Post-op Pain Management: Tylenol PO (pre-op)* and Dilaudid IV   Induction: Intravenous  PONV Risk Score and Plan: 3 and Treatment may vary due to age or medical condition, Ondansetron, Midazolam and Diphenhydramine  Airway Management Planned: Oral ETT  Additional Equipment: ClearSight  Intra-op Plan:   Post-operative Plan: Extubation in OR  Informed Consent: I have reviewed the patients History and Physical, chart, labs and discussed the procedure including the risks, benefits and alternatives for the proposed anesthesia with the patient or authorized representative who has indicated his/her understanding and  acceptance.     Dental advisory given  Plan Discussed with: CRNA and Anesthesiologist  Anesthesia Plan Comments:         Anesthesia Quick Evaluation

## 2023-03-26 ENCOUNTER — Inpatient Hospital Stay (HOSPITAL_COMMUNITY): Payer: 59

## 2023-03-26 ENCOUNTER — Inpatient Hospital Stay (HOSPITAL_COMMUNITY)
Admission: RE | Admit: 2023-03-26 | Discharge: 2023-03-28 | DRG: 454 | Disposition: A | Discharge status: Home / Self Care | Payer: 59 | Attending: Orthopedic Surgery | Admitting: Orthopedic Surgery

## 2023-03-26 ENCOUNTER — Encounter (HOSPITAL_COMMUNITY)
Admission: RE | Disposition: A | Discharge status: Home / Self Care | Payer: Self-pay | Source: Home / Self Care | Attending: Orthopedic Surgery

## 2023-03-26 ENCOUNTER — Encounter (HOSPITAL_COMMUNITY): Payer: Self-pay | Admitting: Orthopedic Surgery

## 2023-03-26 ENCOUNTER — Inpatient Hospital Stay (HOSPITAL_COMMUNITY): Payer: 59 | Admitting: Anesthesiology

## 2023-03-26 ENCOUNTER — Other Ambulatory Visit: Payer: Self-pay

## 2023-03-26 DIAGNOSIS — M960 Pseudarthrosis after fusion or arthrodesis: Secondary | ICD-10-CM

## 2023-03-26 DIAGNOSIS — H9193 Unspecified hearing loss, bilateral: Secondary | ICD-10-CM | POA: Diagnosis present

## 2023-03-26 DIAGNOSIS — Z79899 Other long term (current) drug therapy: Secondary | ICD-10-CM | POA: Diagnosis not present

## 2023-03-26 DIAGNOSIS — M48061 Spinal stenosis, lumbar region without neurogenic claudication: Secondary | ICD-10-CM | POA: Diagnosis present

## 2023-03-26 DIAGNOSIS — M5416 Radiculopathy, lumbar region: Secondary | ICD-10-CM | POA: Diagnosis present

## 2023-03-26 DIAGNOSIS — Z4789 Encounter for other orthopedic aftercare: Secondary | ICD-10-CM | POA: Diagnosis not present

## 2023-03-26 DIAGNOSIS — I10 Essential (primary) hypertension: Secondary | ICD-10-CM | POA: Diagnosis present

## 2023-03-26 DIAGNOSIS — M545 Low back pain, unspecified: Secondary | ICD-10-CM | POA: Diagnosis not present

## 2023-03-26 DIAGNOSIS — M5117 Intervertebral disc disorders with radiculopathy, lumbosacral region: Secondary | ICD-10-CM | POA: Diagnosis present

## 2023-03-26 DIAGNOSIS — F4310 Post-traumatic stress disorder, unspecified: Secondary | ICD-10-CM | POA: Diagnosis present

## 2023-03-26 DIAGNOSIS — Y848 Other medical procedures as the cause of abnormal reaction of the patient, or of later complication, without mention of misadventure at the time of the procedure: Secondary | ICD-10-CM | POA: Diagnosis present

## 2023-03-26 DIAGNOSIS — F259 Schizoaffective disorder, unspecified: Secondary | ICD-10-CM | POA: Diagnosis present

## 2023-03-26 DIAGNOSIS — M4807 Spinal stenosis, lumbosacral region: Secondary | ICD-10-CM | POA: Diagnosis present

## 2023-03-26 DIAGNOSIS — J4489 Other specified chronic obstructive pulmonary disease: Secondary | ICD-10-CM | POA: Diagnosis present

## 2023-03-26 DIAGNOSIS — K219 Gastro-esophageal reflux disease without esophagitis: Secondary | ICD-10-CM | POA: Diagnosis present

## 2023-03-26 DIAGNOSIS — Z7951 Long term (current) use of inhaled steroids: Secondary | ICD-10-CM | POA: Diagnosis not present

## 2023-03-26 DIAGNOSIS — M5137 Other intervertebral disc degeneration, lumbosacral region: Secondary | ICD-10-CM | POA: Diagnosis not present

## 2023-03-26 DIAGNOSIS — T84226A Displacement of internal fixation device of vertebrae, initial encounter: Secondary | ICD-10-CM | POA: Diagnosis present

## 2023-03-26 DIAGNOSIS — G8929 Other chronic pain: Secondary | ICD-10-CM | POA: Diagnosis present

## 2023-03-26 DIAGNOSIS — Z981 Arthrodesis status: Secondary | ICD-10-CM | POA: Diagnosis not present

## 2023-03-26 DIAGNOSIS — R7303 Prediabetes: Secondary | ICD-10-CM | POA: Diagnosis present

## 2023-03-26 DIAGNOSIS — Z6837 Body mass index (BMI) 37.0-37.9, adult: Secondary | ICD-10-CM

## 2023-03-26 DIAGNOSIS — F17200 Nicotine dependence, unspecified, uncomplicated: Secondary | ICD-10-CM | POA: Diagnosis not present

## 2023-03-26 DIAGNOSIS — M4327 Fusion of spine, lumbosacral region: Secondary | ICD-10-CM | POA: Diagnosis not present

## 2023-03-26 DIAGNOSIS — Z833 Family history of diabetes mellitus: Secondary | ICD-10-CM

## 2023-03-26 DIAGNOSIS — J449 Chronic obstructive pulmonary disease, unspecified: Secondary | ICD-10-CM

## 2023-03-26 DIAGNOSIS — Z96642 Presence of left artificial hip joint: Secondary | ICD-10-CM | POA: Diagnosis present

## 2023-03-26 DIAGNOSIS — F17210 Nicotine dependence, cigarettes, uncomplicated: Secondary | ICD-10-CM

## 2023-03-26 DIAGNOSIS — Z7982 Long term (current) use of aspirin: Secondary | ICD-10-CM

## 2023-03-26 DIAGNOSIS — M419 Scoliosis, unspecified: Secondary | ICD-10-CM | POA: Diagnosis present

## 2023-03-26 DIAGNOSIS — Z8249 Family history of ischemic heart disease and other diseases of the circulatory system: Secondary | ICD-10-CM | POA: Diagnosis not present

## 2023-03-26 DIAGNOSIS — E079 Disorder of thyroid, unspecified: Secondary | ICD-10-CM | POA: Diagnosis present

## 2023-03-26 DIAGNOSIS — F418 Other specified anxiety disorders: Secondary | ICD-10-CM | POA: Diagnosis not present

## 2023-03-26 DIAGNOSIS — M5116 Intervertebral disc disorders with radiculopathy, lumbar region: Principal | ICD-10-CM | POA: Diagnosis present

## 2023-03-26 HISTORY — PX: ABDOMINAL EXPOSURE: SHX5708

## 2023-03-26 HISTORY — PX: ANTERIOR LUMBAR FUSION: SHX1170

## 2023-03-26 SURGERY — ANTERIOR LUMBAR FUSION 2 LEVELS
Anesthesia: General | Site: Spine Lumbar

## 2023-03-26 MED ORDER — CHLORHEXIDINE GLUCONATE 0.12 % MT SOLN
15.0000 mL | Freq: Once | OROMUCOSAL | Status: AC
  Administered 2023-03-26: 15 mL via OROMUCOSAL

## 2023-03-26 MED ORDER — VANCOMYCIN HCL IN DEXTROSE 1-5 GM/200ML-% IV SOLN
INTRAVENOUS | Status: AC
  Filled 2023-03-26: qty 200

## 2023-03-26 MED ORDER — ROCURONIUM BROMIDE 10 MG/ML (PF) SYRINGE
PREFILLED_SYRINGE | INTRAVENOUS | Status: AC
  Filled 2023-03-26: qty 10

## 2023-03-26 MED ORDER — CEFAZOLIN SODIUM-DEXTROSE 2-4 GM/100ML-% IV SOLN
2.0000 g | INTRAVENOUS | Status: AC
  Administered 2023-03-26 (×2): 2 g via INTRAVENOUS

## 2023-03-26 MED ORDER — THROMBIN 20000 UNITS EX SOLR
CUTANEOUS | Status: AC
  Filled 2023-03-26: qty 20000

## 2023-03-26 MED ORDER — FENTANYL CITRATE (PF) 100 MCG/2ML IJ SOLN
INTRAMUSCULAR | Status: AC
  Filled 2023-03-26: qty 2

## 2023-03-26 MED ORDER — BUPIVACAINE LIPOSOME 1.3 % IJ SUSP
INTRAMUSCULAR | Status: AC
  Filled 2023-03-26: qty 20

## 2023-03-26 MED ORDER — ONDANSETRON HCL 4 MG/2ML IJ SOLN
4.0000 mg | Freq: Four times a day (QID) | INTRAMUSCULAR | Status: DC | PRN
  Administered 2023-03-27: 4 mg via INTRAVENOUS

## 2023-03-26 MED ORDER — METHOCARBAMOL 500 MG PO TABS
500.0000 mg | ORAL_TABLET | Freq: Four times a day (QID) | ORAL | Status: DC | PRN
  Administered 2023-03-26 – 2023-03-28 (×8): 500 mg via ORAL
  Filled 2023-03-26 (×8): qty 1

## 2023-03-26 MED ORDER — OXYCODONE HCL 5 MG/5ML PO SOLN
ORAL | Status: AC
  Filled 2023-03-26: qty 5

## 2023-03-26 MED ORDER — CEFAZOLIN SODIUM-DEXTROSE 2-4 GM/100ML-% IV SOLN
INTRAVENOUS | Status: AC
  Filled 2023-03-26: qty 100

## 2023-03-26 MED ORDER — BACITRACIN ZINC 500 UNIT/GM EX OINT
TOPICAL_OINTMENT | CUTANEOUS | Status: DC | PRN
  Administered 2023-03-26: 1 via TOPICAL

## 2023-03-26 MED ORDER — PHENYLEPHRINE 80 MCG/ML (10ML) SYRINGE FOR IV PUSH (FOR BLOOD PRESSURE SUPPORT)
PREFILLED_SYRINGE | INTRAVENOUS | Status: AC
  Filled 2023-03-26: qty 10

## 2023-03-26 MED ORDER — MORPHINE SULFATE (PF) 2 MG/ML IV SOLN
1.0000 mg | INTRAVENOUS | Status: DC | PRN
  Administered 2023-03-26 – 2023-03-28 (×5): 2 mg via INTRAVENOUS
  Filled 2023-03-26 (×6): qty 1

## 2023-03-26 MED ORDER — BUPIVACAINE-EPINEPHRINE (PF) 0.25% -1:200000 IJ SOLN
INTRAMUSCULAR | Status: AC
  Filled 2023-03-26: qty 30

## 2023-03-26 MED ORDER — FLEET ENEMA 7-19 GM/118ML RE ENEM: 1.0000 | ENEMA | Freq: Once | RECTAL | Status: DC | PRN

## 2023-03-26 MED ORDER — THROMBIN 20000 UNITS EX SOLR
CUTANEOUS | Status: DC | PRN
  Administered 2023-03-26: 20000 [IU] via TOPICAL

## 2023-03-26 MED ORDER — VANCOMYCIN HCL 1000 MG IV SOLR
INTRAVENOUS | Status: AC
  Filled 2023-03-26: qty 20

## 2023-03-26 MED ORDER — MENTHOL 3 MG MT LOZG: 1.0000 | LOZENGE | OROMUCOSAL | Status: DC | PRN

## 2023-03-26 MED ORDER — ONDANSETRON HCL 4 MG PO TABS: 4.0000 mg | ORAL_TABLET | Freq: Four times a day (QID) | ORAL | Status: DC | PRN

## 2023-03-26 MED ORDER — ORAL CARE MOUTH RINSE: 15.0000 mL | Freq: Once | OROMUCOSAL | Status: AC

## 2023-03-26 MED ORDER — BUPIVACAINE LIPOSOME 1.3 % IJ SUSP: INTRAMUSCULAR | Status: DC | PRN

## 2023-03-26 MED ORDER — LOSARTAN POTASSIUM 50 MG PO TABS
25.0000 mg | ORAL_TABLET | Freq: Every day | ORAL | Status: DC
  Filled 2023-03-26: qty 1

## 2023-03-26 MED ORDER — ALBUTEROL SULFATE (2.5 MG/3ML) 0.083% IN NEBU: 2.5000 mg | INHALATION_SOLUTION | Freq: Four times a day (QID) | RESPIRATORY_TRACT | Status: DC | PRN

## 2023-03-26 MED ORDER — BUPIVACAINE-EPINEPHRINE 0.25% -1:200000 IJ SOLN
INTRAMUSCULAR | Status: DC | PRN
  Administered 2023-03-26: 7 mL

## 2023-03-26 MED ORDER — ONDANSETRON HCL 4 MG/2ML IJ SOLN
INTRAMUSCULAR | Status: AC
  Filled 2023-03-26: qty 2

## 2023-03-26 MED ORDER — ROCURONIUM BROMIDE 10 MG/ML (PF) SYRINGE
PREFILLED_SYRINGE | INTRAVENOUS | Status: DC | PRN
  Administered 2023-03-26: 20 mg via INTRAVENOUS
  Administered 2023-03-26: 10 mg via INTRAVENOUS
  Administered 2023-03-26: 70 mg via INTRAVENOUS
  Administered 2023-03-26: 30 mg via INTRAVENOUS

## 2023-03-26 MED ORDER — DEXAMETHASONE SODIUM PHOSPHATE 10 MG/ML IJ SOLN
INTRAMUSCULAR | Status: DC | PRN
  Administered 2023-03-26: 10 mg via INTRAVENOUS

## 2023-03-26 MED ORDER — ACETAMINOPHEN 500 MG PO TABS
ORAL_TABLET | ORAL | Status: AC
  Filled 2023-03-26: qty 2

## 2023-03-26 MED ORDER — PHENOL 1.4 % MT LIQD: 1.0000 | OROMUCOSAL | Status: DC | PRN

## 2023-03-26 MED ORDER — AMISULPRIDE (ANTIEMETIC) 5 MG/2ML IV SOLN: 10.0000 mg | Freq: Once | INTRAVENOUS | Status: DC | PRN

## 2023-03-26 MED ORDER — HEMOSTATIC AGENTS (NO CHARGE) OPTIME
TOPICAL | Status: DC | PRN
  Administered 2023-03-26: 1 via TOPICAL

## 2023-03-26 MED ORDER — VANCOMYCIN HCL IN DEXTROSE 1-5 GM/200ML-% IV SOLN: 1000.0000 mg | Freq: Once | INTRAVENOUS | Status: DC

## 2023-03-26 MED ORDER — BISACODYL 5 MG PO TBEC: 5.0000 mg | DELAYED_RELEASE_TABLET | Freq: Every day | ORAL | Status: DC | PRN

## 2023-03-26 MED ORDER — HYDROCODONE-ACETAMINOPHEN 5-325 MG PO TABS
1.0000 | ORAL_TABLET | ORAL | Status: DC | PRN
  Administered 2023-03-28 (×2): 2 via ORAL
  Filled 2023-03-26 (×2): qty 2

## 2023-03-26 MED ORDER — SODIUM CHLORIDE 0.9% FLUSH: 3.0000 mL | INTRAVENOUS | Status: DC | PRN

## 2023-03-26 MED ORDER — PROPOFOL 10 MG/ML IV BOLUS
INTRAVENOUS | Status: DC | PRN
  Administered 2023-03-26: 160 mg via INTRAVENOUS

## 2023-03-26 MED ORDER — POTASSIUM CHLORIDE IN NACL 20-0.9 MEQ/L-% IV SOLN: INTRAVENOUS | Status: DC

## 2023-03-26 MED ORDER — SODIUM CHLORIDE 0.9% FLUSH
3.0000 mL | Freq: Two times a day (BID) | INTRAVENOUS | Status: DC
  Administered 2023-03-26 – 2023-03-27 (×2): 3 mL via INTRAVENOUS

## 2023-03-26 MED ORDER — SODIUM CHLORIDE 0.9 % IV SOLN: 250.0000 mL | INTRAVENOUS | Status: DC

## 2023-03-26 MED ORDER — ACETAMINOPHEN 325 MG PO TABS: 650.0000 mg | ORAL_TABLET | ORAL | Status: DC | PRN

## 2023-03-26 MED ORDER — PHENYLEPHRINE 80 MCG/ML (10ML) SYRINGE FOR IV PUSH (FOR BLOOD PRESSURE SUPPORT)
PREFILLED_SYRINGE | INTRAVENOUS | Status: DC | PRN
  Administered 2023-03-26 (×2): 80 ug via INTRAVENOUS
  Administered 2023-03-26: 160 ug via INTRAVENOUS
  Administered 2023-03-26 (×3): 80 ug via INTRAVENOUS
  Administered 2023-03-26 (×2): 160 ug via INTRAVENOUS
  Administered 2023-03-26: 80 ug via INTRAVENOUS
  Administered 2023-03-26: 160 ug via INTRAVENOUS
  Administered 2023-03-26: 80 ug via INTRAVENOUS

## 2023-03-26 MED ORDER — OXYCODONE HCL 5 MG PO TABS: 5.0000 mg | ORAL_TABLET | Freq: Once | ORAL | Status: AC | PRN

## 2023-03-26 MED ORDER — CHLORHEXIDINE GLUCONATE CLOTH 2 % EX PADS: 6.0000 | MEDICATED_PAD | Freq: Once | CUTANEOUS | Status: DC

## 2023-03-26 MED ORDER — LIDOCAINE 2% (20 MG/ML) 5 ML SYRINGE
INTRAMUSCULAR | Status: DC | PRN
  Administered 2023-03-26: 60 mg via INTRAVENOUS

## 2023-03-26 MED ORDER — PAROXETINE HCL ER 12.5 MG PO TB24
25.0000 mg | ORAL_TABLET | Freq: Every day | ORAL | Status: DC
  Administered 2023-03-27 – 2023-03-28 (×2): 25 mg via ORAL
  Filled 2023-03-26 (×2): qty 2

## 2023-03-26 MED ORDER — METHOCARBAMOL 1000 MG/10ML IJ SOLN: 500.0000 mg | Freq: Four times a day (QID) | INTRAVENOUS | Status: DC | PRN

## 2023-03-26 MED ORDER — LITHIUM CARBONATE 300 MG PO CAPS
300.0000 mg | ORAL_CAPSULE | Freq: Two times a day (BID) | ORAL | Status: DC
  Administered 2023-03-26 – 2023-03-28 (×4): 300 mg via ORAL
  Filled 2023-03-26 (×4): qty 1

## 2023-03-26 MED ORDER — OXYCODONE HCL 5 MG/5ML PO SOLN
5.0000 mg | Freq: Once | ORAL | Status: AC | PRN
  Administered 2023-03-26: 5 mg via ORAL

## 2023-03-26 MED ORDER — PSEUDOEPHEDRINE HCL ER 120 MG PO TB12: 120.0000 mg | ORAL_TABLET | Freq: Two times a day (BID) | ORAL | Status: DC

## 2023-03-26 MED ORDER — PANTOPRAZOLE SODIUM 40 MG PO TBEC
80.0000 mg | DELAYED_RELEASE_TABLET | Freq: Every day | ORAL | Status: DC
  Administered 2023-03-27 – 2023-03-28 (×2): 80 mg via ORAL
  Filled 2023-03-26 (×2): qty 2

## 2023-03-26 MED ORDER — ALBUTEROL SULFATE HFA 108 (90 BASE) MCG/ACT IN AERS: 2.0000 | INHALATION_SPRAY | Freq: Four times a day (QID) | RESPIRATORY_TRACT | Status: DC | PRN

## 2023-03-26 MED ORDER — SUCCINYLCHOLINE CHLORIDE 200 MG/10ML IV SOSY
PREFILLED_SYRINGE | INTRAVENOUS | Status: AC
  Filled 2023-03-26: qty 10

## 2023-03-26 MED ORDER — QUETIAPINE FUMARATE 300 MG PO TABS
300.0000 mg | ORAL_TABLET | Freq: Every day | ORAL | Status: DC
  Administered 2023-03-26 – 2023-03-27 (×2): 300 mg via ORAL
  Filled 2023-03-26 (×2): qty 1

## 2023-03-26 MED ORDER — MIDAZOLAM HCL 2 MG/2ML IJ SOLN
INTRAMUSCULAR | Status: AC
  Filled 2023-03-26: qty 2

## 2023-03-26 MED ORDER — SUGAMMADEX SODIUM 200 MG/2ML IV SOLN
INTRAVENOUS | Status: DC | PRN
  Administered 2023-03-26: 300 mg via INTRAVENOUS

## 2023-03-26 MED ORDER — ONDANSETRON HCL 4 MG/2ML IJ SOLN
INTRAMUSCULAR | Status: DC | PRN
  Administered 2023-03-26: 4 mg via INTRAVENOUS

## 2023-03-26 MED ORDER — FLUTICASONE FUROATE-VILANTEROL 200-25 MCG/ACT IN AEPB: 1.0000 | INHALATION_SPRAY | Freq: Every day | RESPIRATORY_TRACT | Status: DC

## 2023-03-26 MED ORDER — DEXAMETHASONE SODIUM PHOSPHATE 10 MG/ML IJ SOLN
INTRAMUSCULAR | Status: AC
  Filled 2023-03-26: qty 1

## 2023-03-26 MED ORDER — PROPOFOL 10 MG/ML IV BOLUS
INTRAVENOUS | Status: AC
  Filled 2023-03-26: qty 20

## 2023-03-26 MED ORDER — LIDOCAINE 2% (20 MG/ML) 5 ML SYRINGE
INTRAMUSCULAR | Status: AC
  Filled 2023-03-26: qty 5

## 2023-03-26 MED ORDER — ZOLPIDEM TARTRATE 5 MG PO TABS: 5.0000 mg | ORAL_TABLET | Freq: Every evening | ORAL | Status: DC | PRN

## 2023-03-26 MED ORDER — CYCLOBENZAPRINE HCL 5 MG PO TABS: 5.0000 mg | ORAL_TABLET | Freq: Every evening | ORAL | Status: DC | PRN

## 2023-03-26 MED ORDER — LORATADINE 10 MG PO TABS: 10.0000 mg | ORAL_TABLET | Freq: Every day | ORAL | Status: DC

## 2023-03-26 MED ORDER — MIDAZOLAM HCL 2 MG/2ML IJ SOLN
INTRAMUSCULAR | Status: DC | PRN
  Administered 2023-03-26: 2 mg via INTRAVENOUS

## 2023-03-26 MED ORDER — DOCUSATE SODIUM 100 MG PO CAPS
100.0000 mg | ORAL_CAPSULE | Freq: Two times a day (BID) | ORAL | Status: DC
  Administered 2023-03-26 – 2023-03-28 (×3): 100 mg via ORAL
  Filled 2023-03-26 (×3): qty 1

## 2023-03-26 MED ORDER — FUROSEMIDE 40 MG PO TABS
40.0000 mg | ORAL_TABLET | Freq: Two times a day (BID) | ORAL | Status: DC
  Administered 2023-03-26 – 2023-03-28 (×3): 40 mg via ORAL
  Filled 2023-03-26 (×3): qty 1

## 2023-03-26 MED ORDER — LACTATED RINGERS IV SOLN: INTRAVENOUS | Status: DC | PRN

## 2023-03-26 MED ORDER — DEXMEDETOMIDINE HCL IN NACL 80 MCG/20ML IV SOLN
INTRAVENOUS | Status: DC | PRN
  Administered 2023-03-26: 8 ug via INTRAVENOUS
  Administered 2023-03-26: 12 ug via INTRAVENOUS

## 2023-03-26 MED ORDER — ENSURE PRE-SURGERY PO LIQD
296.0000 mL | Freq: Once | ORAL | Status: AC
  Administered 2023-03-27: 296 mL via ORAL
  Filled 2023-03-26: qty 296

## 2023-03-26 MED ORDER — LACTATED RINGERS IV SOLN: INTRAVENOUS | Status: DC

## 2023-03-26 MED ORDER — VANCOMYCIN HCL IN DEXTROSE 1-5 GM/200ML-% IV SOLN
1000.0000 mg | Freq: Once | INTRAVENOUS | Status: AC
  Administered 2023-03-26: 1000 mg via INTRAVENOUS
  Filled 2023-03-26: qty 200

## 2023-03-26 MED ORDER — FLUTICASONE PROPIONATE 50 MCG/ACT NA SUSP: 2.0000 | Freq: Every day | NASAL | Status: DC | PRN

## 2023-03-26 MED ORDER — POVIDONE-IODINE 7.5 % EX SOLN
Freq: Once | CUTANEOUS | Status: DC
  Filled 2023-03-26: qty 118

## 2023-03-26 MED ORDER — BACITRACIN ZINC 500 UNIT/GM EX OINT
TOPICAL_OINTMENT | CUTANEOUS | Status: AC
  Filled 2023-03-26: qty 28.35

## 2023-03-26 MED ORDER — ALUM & MAG HYDROXIDE-SIMETH 200-200-20 MG/5ML PO SUSP: 30.0000 mL | Freq: Four times a day (QID) | ORAL | Status: DC | PRN

## 2023-03-26 MED ORDER — SENNOSIDES-DOCUSATE SODIUM 8.6-50 MG PO TABS
1.0000 | ORAL_TABLET | Freq: Every evening | ORAL | Status: DC | PRN
  Administered 2023-03-26 – 2023-03-27 (×2): 1 via ORAL
  Filled 2023-03-26 (×2): qty 1

## 2023-03-26 MED ORDER — MONTELUKAST SODIUM 10 MG PO TABS
10.0000 mg | ORAL_TABLET | Freq: Every day | ORAL | Status: DC
  Administered 2023-03-28: 10 mg via ORAL
  Filled 2023-03-26: qty 1

## 2023-03-26 MED ORDER — FENTANYL CITRATE (PF) 250 MCG/5ML IJ SOLN
INTRAMUSCULAR | Status: AC
  Filled 2023-03-26: qty 5

## 2023-03-26 MED ORDER — VANCOMYCIN HCL 1000 MG IV SOLR
INTRAVENOUS | Status: DC | PRN
  Administered 2023-03-26: 1000 mg via INTRAVENOUS

## 2023-03-26 MED ORDER — CHLORHEXIDINE GLUCONATE 0.12 % MT SOLN
OROMUCOSAL | Status: AC
  Filled 2023-03-26: qty 15

## 2023-03-26 MED ORDER — OXYCODONE-ACETAMINOPHEN 5-325 MG PO TABS
1.0000 | ORAL_TABLET | ORAL | Status: DC | PRN
  Administered 2023-03-26 – 2023-03-27 (×6): 2 via ORAL
  Filled 2023-03-26 (×6): qty 2

## 2023-03-26 MED ORDER — POTASSIUM CHLORIDE CRYS ER 10 MEQ PO TBCR
10.0000 meq | EXTENDED_RELEASE_TABLET | Freq: Every day | ORAL | Status: DC
  Administered 2023-03-28: 10 meq via ORAL
  Filled 2023-03-26: qty 1

## 2023-03-26 MED ORDER — CEFAZOLIN SODIUM-DEXTROSE 2-4 GM/100ML-% IV SOLN
2.0000 g | INTRAVENOUS | Status: AC
  Administered 2023-03-27: 2 g via INTRAVENOUS
  Filled 2023-03-26: qty 100

## 2023-03-26 MED ORDER — ACETAMINOPHEN 500 MG PO TABS
1000.0000 mg | ORAL_TABLET | Freq: Once | ORAL | Status: AC
  Administered 2023-03-26: 1000 mg via ORAL

## 2023-03-26 MED ORDER — PHENYLEPHRINE HCL-NACL 20-0.9 MG/250ML-% IV SOLN
INTRAVENOUS | Status: DC | PRN
  Administered 2023-03-26: 40 ug/min via INTRAVENOUS

## 2023-03-26 MED ORDER — OXYBUTYNIN CHLORIDE ER 10 MG PO TB24: 10.0000 mg | ORAL_TABLET | Freq: Every day | ORAL | Status: DC

## 2023-03-26 MED ORDER — FENTANYL CITRATE (PF) 100 MCG/2ML IJ SOLN
25.0000 ug | INTRAMUSCULAR | Status: DC | PRN
  Administered 2023-03-26 (×3): 50 ug via INTRAVENOUS

## 2023-03-26 MED ORDER — HYDROXYZINE HCL 25 MG PO TABS
25.0000 mg | ORAL_TABLET | Freq: Every day | ORAL | Status: DC
  Administered 2023-03-27 – 2023-03-28 (×2): 25 mg via ORAL
  Filled 2023-03-26: qty 1
  Filled 2023-03-26: qty 3
  Filled 2023-03-26: qty 1

## 2023-03-26 MED ORDER — BENZTROPINE MESYLATE 0.5 MG PO TABS: 0.5000 mg | ORAL_TABLET | Freq: Two times a day (BID) | ORAL | Status: DC

## 2023-03-26 MED ORDER — FENTANYL CITRATE (PF) 250 MCG/5ML IJ SOLN
INTRAMUSCULAR | Status: DC | PRN
  Administered 2023-03-26 (×2): 50 ug via INTRAVENOUS
  Administered 2023-03-26: 100 ug via INTRAVENOUS
  Administered 2023-03-26: 50 ug via INTRAVENOUS

## 2023-03-26 MED ORDER — ADULT MULTIVITAMIN W/MINERALS CH
1.0000 | ORAL_TABLET | Freq: Every day | ORAL | Status: DC
  Administered 2023-03-28: 1 via ORAL
  Filled 2023-03-26: qty 1

## 2023-03-26 MED ORDER — 0.9 % SODIUM CHLORIDE (POUR BTL) OPTIME
TOPICAL | Status: DC | PRN
  Administered 2023-03-26: 1000 mL

## 2023-03-26 MED ORDER — ACETAMINOPHEN 650 MG RE SUPP: 650.0000 mg | RECTAL | Status: DC | PRN

## 2023-03-26 SURGICAL SUPPLY — 93 items
AGENT HMST KT MTR STRL THRMB (HEMOSTASIS)
APL SKNCLS STERI-STRIP NONHPOA (GAUZE/BANDAGES/DRESSINGS) ×2
APPLIER CLIP 11 MED OPEN (CLIP)
APR CLP MED 11 20 MLT OPN (CLIP)
BAG COUNTER SPONGE SURGICOUNT (BAG) ×4 IMPLANT
BAG SPNG CNTER NS LX DISP (BAG) ×2
BENZOIN TINCTURE PRP APPL 2/3 (GAUZE/BANDAGES/DRESSINGS) IMPLANT
BLADE CLIPPER SURG (BLADE) IMPLANT
BLADE SURG 10 STRL SS (BLADE) ×2 IMPLANT
BONE VIVIGEN FORMABLE 10CC (Bone Implant) ×2 IMPLANT
CLIP APPLIE 11 MED OPEN (CLIP) ×4 IMPLANT
CLIP LIGATING EXTRA MED SLVR (CLIP) IMPLANT
CORD BIPOLAR FORCEPS 12FT (ELECTRODE) ×2 IMPLANT
COVER SURGICAL LIGHT HANDLE (MISCELLANEOUS) ×2 IMPLANT
DRAPE C-ARM 42X72 X-RAY (DRAPES) ×4 IMPLANT
DRAPE POUCH INSTRU U-SHP 10X18 (DRAPES) ×2 IMPLANT
DRAPE SURG 17X23 STRL (DRAPES) ×6 IMPLANT
DRSG MEPILEX POST OP 4X12 (GAUZE/BANDAGES/DRESSINGS) ×2 IMPLANT
DURAPREP 26ML APPLICATOR (WOUND CARE) ×2 IMPLANT
ELECT BLADE 4.0 EZ CLEAN MEGAD (MISCELLANEOUS) ×2
ELECT CAUTERY BLADE 6.4 (BLADE) ×2 IMPLANT
ELECT REM PT RETURN 9FT ADLT (ELECTROSURGICAL) ×2
ELECTRODE BLDE 4.0 EZ CLN MEGD (MISCELLANEOUS) ×4 IMPLANT
ELECTRODE REM PT RTRN 9FT ADLT (ELECTROSURGICAL) ×2 IMPLANT
FILTER STRAW FLUID ASPIR (MISCELLANEOUS) IMPLANT
FUNNEL BONE TUBE 6001 GL (ORTHOPEDIC DISPOSABLE SUPPLIES) IMPLANT
GAUZE 4X4 16PLY ~~LOC~~+RFID DBL (SPONGE) IMPLANT
GAUZE SPONGE 4X4 12PLY STRL (GAUZE/BANDAGES/DRESSINGS) IMPLANT
GLOVE BIO SURGEON STRL SZ 6.5 (GLOVE) ×2 IMPLANT
GLOVE BIO SURGEON STRL SZ7.5 (GLOVE) ×2 IMPLANT
GLOVE BIO SURGEON STRL SZ8 (GLOVE) ×2 IMPLANT
GLOVE BIOGEL PI IND STRL 7.0 (GLOVE) ×2 IMPLANT
GLOVE BIOGEL PI IND STRL 8 (GLOVE) ×6 IMPLANT
GLOVE SURG ENC MOIS LTX SZ6.5 (GLOVE) ×2 IMPLANT
GOWN STRL REUS W/ TWL LRG LVL3 (GOWN DISPOSABLE) ×4 IMPLANT
GOWN STRL REUS W/ TWL XL LVL3 (GOWN DISPOSABLE) ×4 IMPLANT
GOWN STRL REUS W/TWL LRG LVL3 (GOWN DISPOSABLE) ×6
GOWN STRL REUS W/TWL XL LVL3 (GOWN DISPOSABLE) ×4
GRAFT BNE MATRIX VG FRMBL L 10 (Bone Implant) IMPLANT
HEMOSTAT SURGICEL 2X14 (HEMOSTASIS) IMPLANT
INSERT FOGARTY 61MM (MISCELLANEOUS) IMPLANT
INSERT FOGARTY SM (MISCELLANEOUS) IMPLANT
KIT BASIN OR (CUSTOM PROCEDURE TRAY) ×2 IMPLANT
KIT TURNOVER KIT B (KITS) ×2 IMPLANT
NDL HYPO 25GX1X1/2 BEV (NEEDLE) ×2 IMPLANT
NDL SPNL 18GX3.5 QUINCKE PK (NEEDLE) ×2 IMPLANT
NEEDLE HYPO 25GX1X1/2 BEV (NEEDLE) ×2 IMPLANT
NEEDLE SPNL 18GX3.5 QUINCKE PK (NEEDLE) ×2 IMPLANT
NS IRRIG 1000ML POUR BTL (IV SOLUTION) ×2 IMPLANT
PACK LAMINECTOMY ORTHO (CUSTOM PROCEDURE TRAY) ×2 IMPLANT
PACK UNIVERSAL I (CUSTOM PROCEDURE TRAY) ×2 IMPLANT
PAD ARMBOARD 7.5X6 YLW CONV (MISCELLANEOUS) ×8 IMPLANT
PATTIES SURGICAL .5 X1 (DISPOSABLE) ×2 IMPLANT
SPACER ALF EX MG 26X34 8D10-14 (Spacer) IMPLANT
SPACER ALIF EXP MAG 26X34 8D (Spacer) IMPLANT
SPCR ALIF EX MAG 26X34 8D10-14 (Spacer) ×2 IMPLANT
SPONGE INTESTINAL PEANUT (DISPOSABLE) ×8 IMPLANT
SPONGE SURGIFOAM ABS GEL 100 (HEMOSTASIS) ×4 IMPLANT
SPONGE T-LAP 18X18 ~~LOC~~+RFID (SPONGE) ×2 IMPLANT
STRIP CLOSURE SKIN 1/2X4 (GAUZE/BANDAGES/DRESSINGS) IMPLANT
SURGIFLO W/THROMBIN 8M KIT (HEMOSTASIS) IMPLANT
SUT ETHILON 2 0 FS 18 (SUTURE) IMPLANT
SUT MNCRL AB 4-0 PS2 18 (SUTURE) ×2 IMPLANT
SUT PDS AB 1 CTX 36 (SUTURE) ×4 IMPLANT
SUT PROLENE 4 0 RB 1 (SUTURE)
SUT PROLENE 4-0 RB1 .5 CRCL 36 (SUTURE) IMPLANT
SUT PROLENE 5 0 C 1 24 (SUTURE) IMPLANT
SUT PROLENE 5 0 CC1 (SUTURE) IMPLANT
SUT PROLENE 6 0 C 1 30 (SUTURE) IMPLANT
SUT PROLENE 6 0 CC (SUTURE) IMPLANT
SUT SILK 0 TIES 10X30 (SUTURE) IMPLANT
SUT SILK 2 0 TIES 10X30 (SUTURE) ×4 IMPLANT
SUT SILK 2 0SH CR/8 30 (SUTURE) IMPLANT
SUT SILK 3 0 TIES 10X30 (SUTURE) ×2 IMPLANT
SUT SILK 3 0 TIES 17X18 (SUTURE)
SUT SILK 3 0SH CR/8 30 (SUTURE) IMPLANT
SUT SILK 3-0 18XBRD TIE BLK (SUTURE) IMPLANT
SUT VIC AB 0 CT1 18XCR BRD 8 (SUTURE) IMPLANT
SUT VIC AB 0 CT1 8-18 (SUTURE) ×2
SUT VIC AB 1 CT1 27 (SUTURE) ×4
SUT VIC AB 1 CT1 27XBRD ANBCTR (SUTURE) ×4 IMPLANT
SUT VIC AB 1 CTX 36 (SUTURE) ×4
SUT VIC AB 1 CTX36XBRD ANBCTR (SUTURE) ×4 IMPLANT
SUT VIC AB 2-0 CT2 18 VCP726D (SUTURE) ×2 IMPLANT
SYR BULB IRRIG 60ML STRL (SYRINGE) ×2 IMPLANT
TAPE CLOTH SOFT 2X10 (GAUZE/BANDAGES/DRESSINGS) IMPLANT
TOWEL GREEN STERILE (TOWEL DISPOSABLE) ×4 IMPLANT
TOWEL GREEN STERILE FF (TOWEL DISPOSABLE) ×2 IMPLANT
TRAY FOLEY W/BAG SLVR 16FR (SET/KITS/TRAYS/PACK) ×2
TRAY FOLEY W/BAG SLVR 16FR ST (SET/KITS/TRAYS/PACK) ×2 IMPLANT
TUBING ANTICOAG CELL SAVER (IV SETS) IMPLANT
WATER STERILE IRR 1000ML POUR (IV SOLUTION) ×2 IMPLANT
YANKAUER SUCT BULB TIP NO VENT (SUCTIONS) ×2 IMPLANT

## 2023-03-26 NOTE — OR Nursing (Signed)
Per Dr. Alfredo Batty Radiologist Herby Abraham was clear of any foreign objects.

## 2023-03-26 NOTE — Anesthesia Procedure Notes (Addendum)
Procedure Name: Intubation Date/Time: 03/26/2023 7:58 AM  Performed by: Shary Decamp, CRNAPre-anesthesia Checklist: Patient identified, Patient being monitored, Timeout performed, Emergency Drugs available and Suction available Patient Re-evaluated:Patient Re-evaluated prior to induction Oxygen Delivery Method: Circle System Utilized Preoxygenation: Pre-oxygenation with 100% oxygen Induction Type: IV induction Ventilation: Mask ventilation without difficulty and Oral airway inserted - appropriate to patient size Laryngoscope Size: Hyacinth Meeker and 2 Grade View: Grade II Tube type: Oral Tube size: 7.0 mm Number of attempts: 1 Airway Equipment and Method: Stylet Placement Confirmation: ETT inserted through vocal cords under direct vision, positive ETCO2 and breath sounds checked- equal and bilateral Secured at: 21 cm Tube secured with: Tape Dental Injury: Teeth and Oropharynx as per pre-operative assessment

## 2023-03-26 NOTE — Anesthesia Preprocedure Evaluation (Signed)
Anesthesia Evaluation  Patient identified by MRN, date of birth, ID band Patient awake    Reviewed: Allergy & Precautions, NPO status , Patient's Chart, lab work & pertinent test results  History of Anesthesia Complications Negative for: history of anesthetic complications  Airway Mallampati: II  TM Distance: >3 FB Neck ROM: Full    Dental  (+) Dental Advisory Given   Pulmonary asthma , COPD,  COPD inhaler, Current Smoker and Patient abstained from smoking.   Pulmonary exam normal        Cardiovascular hypertension, Pt. on medications Normal cardiovascular exam     Neuro/Psych Seizures -, Well Controlled,  PSYCHIATRIC DISORDERS Anxiety Depression Bipolar Disorder    Schizoaffective d/o  B/l hearing loss     GI/Hepatic ,GERD  Medicated and Controlled,,(+)     substance abuse  alcohol use, cocaine use and IV drug use  Endo/Other    Morbid obesity  Renal/GU negative Renal ROS  negative genitourinary   Musculoskeletal  (+) Arthritis ,  narcotic dependent Scoliosis    Abdominal  (+) + obese  Peds  Hematology negative hematology ROS (+)   Anesthesia Other Findings   Reproductive/Obstetrics                             Anesthesia Physical Anesthesia Plan  ASA: 3  Anesthesia Plan: General   Post-op Pain Management: Dilaudid IV   Induction: Intravenous  PONV Risk Score and Plan: 3 and Treatment may vary due to age or medical condition, Ondansetron, Midazolam and Diphenhydramine  Airway Management Planned: Oral ETT  Additional Equipment: ClearSight  Intra-op Plan:   Post-operative Plan: Extubation in OR  Informed Consent: I have reviewed the patients History and Physical, chart, labs and discussed the procedure including the risks, benefits and alternatives for the proposed anesthesia with the patient or authorized representative who has indicated his/her understanding and  acceptance.     Dental advisory given  Plan Discussed with: CRNA  Anesthesia Plan Comments:        Anesthesia Quick Evaluation

## 2023-03-26 NOTE — Anesthesia Postprocedure Evaluation (Signed)
Anesthesia Post Note  Patient: Alicia Blake  Procedure(s) Performed: REMOVAL OF POSTERIOR HARDWARE (Spine Lumbar) LUMBAR FOUR - LUMBAR FIVE, LUMBAR FIVE - SACRUM ONE ANTERIOR LUMBAR INTERBODY FUSION WITH INSTRUMENTATION AND ALLOGRAFT (Spine Lumbar) ABDOMINAL EXPOSURE (Abdomen)     Patient location during evaluation: PACU Anesthesia Type: General Level of consciousness: awake and alert Pain management: pain level controlled Vital Signs Assessment: post-procedure vital signs reviewed and stable Respiratory status: spontaneous breathing, nonlabored ventilation and respiratory function stable Cardiovascular status: stable and blood pressure returned to baseline Anesthetic complications: no   No notable events documented.  Last Vitals:  Vitals:   03/26/23 1345 03/26/23 1400  BP: 115/64 108/76  Pulse: 88 79  Resp: 18 15  Temp:    SpO2: 100% 100%    Last Pain:  Vitals:   03/26/23 1400  PainSc: 5                  Beryle Lathe

## 2023-03-26 NOTE — Transfer of Care (Signed)
Immediate Anesthesia Transfer of Care Note  Patient: Alicia Blake  Procedure(s) Performed: REMOVAL OF POSTERIOR HARDWARE (Spine Lumbar) LUMBAR FOUR - LUMBAR FIVE, LUMBAR FIVE - SACRUM ONE ANTERIOR LUMBAR INTERBODY FUSION WITH INSTRUMENTATION AND ALLOGRAFT (Spine Lumbar) ABDOMINAL EXPOSURE (Abdomen)  Patient Location: PACU  Anesthesia Type:General  Level of Consciousness: drowsy, patient cooperative, and responds to stimulation  Airway & Oxygen Therapy: Patient Spontanous Breathing and Patient connected to face mask oxygen  Post-op Assessment: Report given to RN, Post -op Vital signs reviewed and stable, and Patient moving all extremities X 4  Post vital signs: Reviewed and stable  Last Vitals:  Vitals Value Taken Time  BP 113/66 03/26/23 1313  Temp    Pulse    Resp 18 03/26/23 1316  SpO2    Vitals shown include unvalidated device data.  Last Pain:  Vitals:   03/26/23 0558  PainSc: 9       Patients Stated Pain Goal: 2 (03/26/23 0558)  Complications: No notable events documented.

## 2023-03-26 NOTE — H&P (Signed)
PREOPERATIVE H&P  Chief Complaint: Low back pain, bilateral leg pain  HPI: Alicia Blake is a 57 y.o. female who presents with ongoing pain in the back and bilateral legs.  The patient is status post a previous decompression in the back, and a previous attempted fusion.  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  MRI reveals stenosis at L4-5 and L5-S1, and a CAT scan does reveal a nonunion with loose screws at L5-S1.  Patient has failed multiple forms of conservative care and continues to have pain (see office notes for additional details regarding the patient's full course of treatment)  Past Medical History:  Diagnosis Date   Asthma    Bipolar affect, depressed (HCC)    Bronchitis    Chronic back pain    Closed left ankle fracture    COPD (chronic obstructive pulmonary disease) (HCC)    DJD (degenerative joint disease)    BACK   GERD (gastroesophageal reflux disease)    Hypertension    Pneumonia    Pre-diabetes    dr entered diabetes without complications- no meds does not test sugar at home   PTSD (post-traumatic stress disorder)    Scoliosis    Seizures (HCC)    "when drinking" last yrs ago   Shortness of breath dyspnea    exersion   Substance abuse (HCC)    crack cocaine, 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, none since   Thyroid disease    UTI (lower urinary tract infection)    Past Surgical History:  Procedure Laterality Date   2 LEFT TOES SURGERY  12/2014   finished only on right side   BACK SURGERY     BALLOON DILATION N/A 12/14/2015   Procedure: BALLOON DILATION;  Surgeon: [name], MD;  Location: [facility];  Service: Endoscopy;  Laterality: N/A;   COLONOSCOPY WITH PROPOFOL N/A 12/14/2015   Procedure: COLONOSCOPY WITH PROPOFOL;  Surgeon: [name], MD;  Location: [facility];  Service: Endoscopy;  Laterality: N/A;   ESOPHAGOGASTRODUODENOSCOPY (EGD) WITH PROPOFOL N/A 12/14/2015   Procedure: ESOPHAGOGASTRODUODENOSCOPY (EGD) WITH PROPOFOL;  Surgeon:  [name], MD;  Location: [facility];  Service: Endoscopy;  Laterality: N/A;   FRACTURE SURGERY     JOINT REPLACEMENT     left hip   LEFT ANKLE SURGERY  12/2014   LUMBAR LAMINECTOMY/DECOMPRESSION MICRODISCECTOMY N/A 06/18/2017   Procedure: LAMINECTOMY AND FORAMINOTOMY LUMBAR FOUR - LUMBAR FIVE;  Surgeon: Coletta Memos, MD;  Location: [facility];  Service: Neurosurgery;  Laterality: N/A;  LAMINECTOMY AND FORAMINOTOMY LUMBAR 4- LUMBAR 5   LUMBAR WOUND DEBRIDEMENT N/A 03/10/2020   Procedure: Lumbar Wound Debridement;  Surgeon: Coletta Memos, MD;  Location: [facility] OR;  Service: Neurosurgery;  Laterality: N/A;  posterior   NECK SURGERY  AS TEENAGER   STITCHES TO NECK    ORIF ANKLE FRACTURE Left 07/24/2021   Procedure: OPEN REDUCTION INTERNAL FIXATION (ORIF) ANKLE FRACTURE;  Surgeon: Sheral Apley, MD;  Location:  S[facility]ERY [facility];  Service: Orthopedics;  Laterality: Left;   SURGERY FOR CUT ON STOMACH  TEENAGER   TOTAL HIP ARTHROPLASTY Left 05/14/2016   Procedure: TOTAL HIP ARTHROPLASTY ANTERIOR APPROACH;  Surgeon: Sheral Apley, MD;  Location: [facility] OR;  Service: Orthopedics;  Laterality: Left;   Social History   Socioeconomic History   Marital status: Single    Spouse name: Not on file   Number of children: Not on file   Years of education: Not on file   Highest education level: Not on  file  Occupational History   Not on file  Tobacco Use   Smoking status: Every Day    Packs/day: 0.50    Years: 26.00    Additional pack years: 0.00    Total pack years: 13.00    Types: Cigarettes   Smokeless tobacco: Never  Vaping Use   Vaping Use: Never used  Substance and Sexual Activity   Alcohol use: No    Comment: quit date 01/17/2013   Drug use: No    Types: "Crack" cocaine, Heroin    Comment: none since 03-2020   Sexual activity: Yes    Birth control/protection: None, Post-menopausal  Other Topics Concern   Not on file  Social History Narrative   Not on file   Social  Determinants of Health   Financial Resource Strain: Not on file  Food Insecurity: Not on file  Transportation Needs: Not on file  Physical Activity: Not on file  Stress: Not on file  Social Connections: Not on file   Family History  Problem Relation Age of Onset   Hypertension Mother    Hypertension Maternal Grandmother    Hypertension Paternal Grandmother    Diabetes Paternal Grandmother    Hypertension Paternal Grandfather    Diabetes Paternal Grandfather    Alcohol abuse Paternal Aunt    Mental illness Cousin    Heart attack Cousin    Heart attack Maternal Uncle    Allergies  Allergen Reactions   Risperidone And Related Swelling and Other (See Comments)    Facial swelling   Prior to Admission medications   Medication Sig Start Date End Date Taking? Authorizing Provider  ABILIFY MAINTENA 400 MG PRSY prefilled syringe Inject 400 mg into the muscle every 28 (twenty-eight) days. 06/13/21  Yes [provider]  acetaminophen (TYLENOL) 325 MG tablet Take 650 mg by mouth every 6 (six) hours as needed for moderate pain.   Yes [provider]  albuterol (VENTOLIN HFA) 108 (90 Base) MCG/ACT inhaler Inhale 2 puffs into the lungs every 6 (six) hours as needed for wheezing. 12/17/21  Yes Raspet, Erin K, PA-C  benztropine (COGENTIN) 0.5 MG tablet Take 0.5 mg by mouth 2 (two) times daily.   Yes [provider]  celecoxib (CELEBREX) 200 MG capsule Take 1 capsule (200 mg total) by mouth 2 (two) times daily. 06/23/22  Yes Harris, Abigail, PA-C  cetirizine (ZYRTEC) 10 MG tablet Take 10 mg by mouth daily. 05/30/22  Yes [provider]  cyclobenzaprine (FLEXERIL) 5 MG tablet Take 5-10 mg by mouth at bedtime as needed for muscle spasms. 03/07/23  Yes [provider]  esomeprazole (NEXIUM) 40 MG capsule Take 40 mg by mouth daily. 09/22/19  Yes [provider]  fluticasone (FLONASE) 50 MCG/ACT nasal spray Place 2 sprays into both nostrils daily as needed  for allergies.   Yes [provider]  furosemide (LASIX) 40 MG tablet Take 40 mg by mouth 2 (two) times daily. 10/30/22  Yes [provider]  hydrOXYzine (VISTARIL) 25 MG capsule Take 25 mg by mouth daily. 06/16/19  Yes [provider]  lithium 300 MG tablet Take 300 mg by mouth 2 (two) times daily. 10/07/22  Yes [provider]  losartan (COZAAR) 25 MG tablet Take 25 mg by mouth daily. 09/18/19  Yes Norm Salt, PA  montelukast (SINGULAIR) 10 MG tablet Take 10 mg by mouth daily. 04/24/22  Yes [provider]  Multiple Vitamin (MULTIVITAMIN) tablet Take 1 tablet by mouth daily.   Yes  [provider]  oxybutynin (DITROPAN-XL) 10 MG 24 hr tablet Take 10 mg by mouth daily.   Yes [provider]  PARoxetine (PAXIL-CR) 25 MG 24 hr tablet Take 25 mg by mouth daily. 10/07/22  Yes [provider]  potassium chloride (KLOR-CON) 10 MEQ tablet Take 10 mEq by mouth daily. 04/24/22  Yes [provider]  QUEtiapine (SEROQUEL) 200 MG tablet Take 300 mg by mouth at bedtime. 10/29/22  Yes [provider]  tiZANidine (ZANAFLEX) 4 MG tablet Take 4 mg by mouth 2 (two) times daily as needed for muscle spasms.   Yes [provider]  acetaminophen (TYLENOL) 500 MG tablet Take 2 tablets (1,000 mg total) by mouth every 8 (eight) hours as needed for mild pain or moderate pain. Patient not taking: Reported on 12/08/2022 07/24/21   Jenne Pane, PA-C  albuterol (PROVENTIL) (2.5 MG/3ML) 0.083% nebulizer solution Take 3 mLs (2.5 mg total) by nebulization every 6 (six) hours as needed for wheezing or shortness of breath. 12/17/21   Raspet, Noberto Retort, PA-C  aspirin EC 81 MG tablet Take 1 tablet (81 mg total) by mouth 2 (two) times daily. For DVT prophylaxis for 30 days after surgery. Patient taking differently: Take 81 mg by mouth daily. 07/24/21   Jenne Pane, PA-C  budesonide-formoterol (SYMBICORT) 160-4.5 MCG/ACT inhaler Inhale 2  puffs into the lungs in the morning and at bedtime. 11/27/21   Crain, Whitney L, PA  cyclobenzaprine (FLEXERIL) 10 MG tablet Take 1 tablet (10 mg total) by mouth 2 (two) times daily as needed for muscle spasms. Patient not taking: Reported on 03/14/2023 12/08/22   Evlyn Kanner T, PA-C  doxycycline (VIBRAMYCIN) 100 MG capsule Take 1 capsule (100 mg total) by mouth 2 (two) times daily. Patient not taking: Reported on 03/14/2023 12/17/21   Raspet, Noberto Retort, PA-C  HYDROcodone-acetaminophen (NORCO) 10-325 MG tablet Take 1 tablet by mouth every 6 (six) hours as needed for severe pain. Do not take more than 6 tablets in a 24 hour period. Patient not taking: Reported on 11/19/2022 07/24/21   Jenne Pane, PA-C  lidocaine (LIDODERM) 5 % Place 1 patch onto the skin daily. Remove & Discard patch within 12 hours or as directed by MD Patient not taking: Reported on 03/14/2023 02/25/23   Linwood Dibbles, MD  methylPREDNISolone (MEDROL DOSEPAK) 4 MG TBPK tablet Take one dose pack per package instructions Patient not taking: Reported on 11/19/2022 12/17/21   Raspet, Noberto Retort, PA-C  naproxen (NAPROSYN) 375 MG tablet Take 1 tablet (375 mg total) by mouth 2 (two) times daily. Patient not taking: Reported on 12/08/2022 10/02/22   Linwood Dibbles, MD  ondansetron (ZOFRAN ODT) 4 MG disintegrating tablet Take 1 tablet (4 mg total) by mouth every 8 (eight) hours as needed for nausea or vomiting. Patient not taking: Reported on 11/19/2022 07/24/21   Jenne Pane, PA-C  traMADol (ULTRAM) 50 MG tablet Take 1 tablet (50 mg total) by mouth every 6 (six) hours as needed. Patient not taking: Reported on 03/14/2023 02/25/23   Linwood Dibbles, MD     All other systems have been reviewed and were otherwise negative with the exception of those mentioned in the HPI and as above.  Physical Exam: Vitals:   03/26/23 0546  BP: 122/76  Pulse: (!) 102  Resp: 18  Temp: 98.2 F (36.8 C)  SpO2: 96%    Body mass index is 37.77 kg/m.  General: Alert, no  acute distress Cardiovascular: No pedal edema Respiratory:  No cyanosis, no use of accessory musculature Skin: No lesions in the area of chief complaint Neurologic: Sensation intact distally Psychiatric: Patient is competent for consent with normal mood and affect Lymphatic: No axillary or cervical lymphadenopathy   Assessment/Plan: Ongoing and increasing axial low back pain, with bilateral leg pain, with a CT myogram notable for a nonunion at L5-S1, in addition to severe disc degeneration at L4-L5 and L5-S1, in addition to stenosis noted at L4-L5 and L5-S1. Plan for Procedure(s): REMOVAL OF POSTERIOR HARDWARE AND LUMBAR 4- LUMBAR 5, LUMBAR 5- SACRUM 1 ANTERIOR LUMBAR INTERBODY FUSION WITH INSTRUMENTATION AND ALLOGRAFT ABDOMINAL EXPOSURE  The patient was fully made aware that it is possible that we will not be able to proceed with an anterior approach, due to her body habitus and anatomy.  This was discussed with the patient by me and Dr. Chestine Spore.   Jackelyn Hoehn, MD 03/26/2023 7:37 AM

## 2023-03-26 NOTE — Op Note (Signed)
PATIENT NAME: Alicia Blake   MEDICAL RECORD NO.:   [id number]    DATE OF BIRTH: [birth year]   DATE OF PROCEDURE: 03/26/2023                               OPERATIVE REPORT    PREOPERATIVE DIAGNOSES: 1. Bilateral lumbar radiculopathy. 2. S/p previous L4/5 and L5/S1 decompression 3. L4-5, L5-S1 degenerative disk disease 4. Status post previous attempted L5-S1 fusion with instrumentation 5. L5-S1 nonunion   POSTOPERATIVE DIAGNOSES: 1. Bilateral lumbar radiculopathy. 2. S/p previous L4/5 and L5/S1 decompression 3. L4-5, L5-S1 degenerative disk disease 4. Status post previous attempted L5-S1 fusion with instrumentation 5. L5-S1 nonunion   PROCEDURE: 1. Anterior lumbar interbody fusion, L4-5, L5-S1 (expsure peformed by Dr. Clotilde Dieter) 2. Insertion of interbody device x2 (Globus expandable spacers). 3. Removal of posterior instrumentation bilaterally, L5, S1 4. Intraoperative use of fluoroscopy. 5. Use of morselized allograft - Vivigen 6. Co-surgeon with Dr. Clotilde Dieter for retroperitoneal exposure   SURGEON:  Estill Bamberg, MD   ASSISTANT:  Jason Coop, PA-C.   ANESTHESIA:  General endotracheal anesthesia.   COMPLICATIONS:  None.   DISPOSITION:  Stable.   ESTIMATED BLOOD LOSS:  Minimal.   INDICATIONS FOR SURGERY:  Briefly, Ms. Parfitt is a very pleasant 57 year old female, who has been having progressive debilitating pain in the bilateral legs and low back.  Her imaging studies did reveal a nonunion at L5-S1, with loose hardware.  She was also noted to have stenosis at L4-5 and L5-S1.  She is status post a decompressive procedure at both L4-5 and L5-S1.  She did previously have a lumbar wound infection from a surgery performed by another provider, and did need to be taken to surgery for irrigation of her lumbar wound. The patient did fail appropriate nonoperative measures, but did continue to have significant pain. Given her ongoing pain and dysfunction, we did discuss  proceeding with the procedure noted above.  The patient was fully aware of the risks and limitations associated with surgery, and did wish to proceed.  The plan was to proceed with stage 1 of her procedure today, and to return tomorrow for stage 2, specifically, a posterior fusion procedure, with instrumentation, and potentially a posterior decompression.  Of note, she did understand that there was significant risk associated with surgery.  In particular, she did have a wound infection in the past, which does increase her risk for an additional infection.  In addition, she is morbidly obese, which does increase the risk of failure of her hardware and an additional nonunion.  She did wish to proceed with surgery.     OPERATIVE DETAILS:  On 03/26/2023, the patient was brought to surgery and general endotracheal anesthesia was administered.  The patient was placed prone on a Jackson spinal frame.  The back was prepped and draped in the usual fashion, and all bony prominences were meticulously padded.  The patient was given both Ancef and vancomycin preoperatively, given her previous lumbar wound infection.  Once a timeout procedure was performed, a midline incision was made over the lower lumbar spine.  The previously placed posterior hardware was noted.  The caps and interconnecting rods were removed, and subsequently, the pedicle screws at L5 and S1 were also removed uneventfully.  The wound was copiously irrigated, and closed in layers, using #1 Vicryl, followed by 2-0 Vicryl, followed by 2-0 nylon.  Benzoin and a sterile dressing was  applied.  At this point, the patient was placed supine on a flat Jackson bed.  The abdomen was prepped and draped in the usual sterile fashion, and again, a timeout procedure was performed. An anterior retroperitoneal approach was then performed by Dr. Clotilde Dieter.  I did function as his co-surgeon during the approach.  Once the anterior lumbar spine was noted, we did focus  our attention on the L5-S1 intervertebral space.  I then performed a thorough and complete L5-S1 intervertebral diskectomy to the level of the posterior longitudinal ligament.  I was very pleased with the diskectomy that I was able to accomplish.  The endplates were then appropriately prepared and the appropriate sized anterior intervertebral spacer was packed with Vivigen and tamped into position, after which point it was expanded to approximately 11.8 mm in height. I was very pleased with the press-fit of the implant.  Dr. Clotilde Dieter then scrubbed back into the case, and repositioned the retractors over the L4/5 intervertebral space.    I then performed a thorough and complete L4/5 intervertebral diskectomy to the level of the posterior longitudinal ligament.  I was very pleased with the diskectomy that I was able to accomplish.  The endplates were then appropriately prepared and the appropriate sized anterior intervertebral spacer was packed with Vivigen and tamped into position, and expanded to approximately 10 mm in height. I was very pleased with the press-fit of the implant.      I did liberally use AP and lateral fluoroscopy to ensure that the implants were appropriately positioned, and was very pleased with the radiographs.  The wound was copiously irrigated.  The fascia was  closed using #1 PDS.  The subcutaneous layer was closed using 0 Vicryl followed by 2-0 Vicryl, and the skin was closed using 4-0 Monocryl. Benzoin and Steri-Strips were applied followed by sterile dressing.   All instrument counts were correct at the termination of the procedure.    Of note, Jason Coop was my assistant throughout surgery, and did aid in retraction, suctioning, and closure.     Estill Bamberg, MD
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
## 2023-03-26 NOTE — Op Note (Signed)
Date: Mar 26, 2023  Preoperative diagnosis: Chronic lower back pain with neuropathic leg pain  Postoperative diagnosis: Same  Procedure: Anterior spine exposure for L4-L5 and L5-S1 ALIF via anterior retroperitoneal approach  Surgeon: Dr. Cephus Shelling, MD  Co-surgeon: Dr. Estill Bamberg, MD  Assistant: Jason Coop, PA  Indications: 57 year old female that has chronic lower back pain with neuropathic leg pain.  She has had a previous posterior decompression at L5-S1 with nonunion.  MRI revealed stenosis at L4-5 and L5-S1.  She presents for anterior lumbar interbody fusion at L4-L5 and L5-S1 after risks benefits discussed.  Vascular surgery was asked to assist with anterior spine exposure.  Findings: Patient was initially prone and Dr. Yevette Edwards removed the screws from her previous decompression posteriorly.  I was called after the patient was flipped supine.  The L4-5 and L5-S1 disc space were marked over the left rectus muscle with a fluoroscopic C arm in the lateral position.  Paramedian incision was made over the left rectus muscle and we dissected through the subcutaneous tissue.  The left anterior rectus sheath was opened longitudinally over the left rectus muscle and I raised flaps underneath the anterior rectus sheath.  The left rectus muscle was mobilized to the midline and we entered into the retroperitoneum and mobilized peritoneum and left ureter to the midline.  Posterior rectus sheath was taken down above arcuate line to get the L4-L5 disc space exposed.  Initially mobilized at the L5-S1 disc space including ligating the middle sacral branches and then went up to the L4-L5 disc space and mobilized the left iliac artery and vein to the midline and ligated an iliolumbar.  Anesthesia: General  Details: Patient was taken to the operating room after informed consent was obtained.  Placed on the operative table supine position.  General endotracheal anesthesia was induced.   Initially the patient was placed in the prone position and screws were removed from the previous decompression.  Please see Dr. Marshell Levan dictation.  I was called in the room once the patient was flipped back into the supine position.  The L4-L5 and L5-S1 disc space was marked over the left rectus muscle with a fluoroscopic C arm in the lateral position.  I marked out a paramedian incision over these two disc space over the left rectus muscle.  The abdominal wall was then prepped and draped in standard sterile fashion.  Another timeout was performed.  Antibiotics were updated.  Paramedian incision was made over the left rectus muscle over our preoperative mark.  Dissected down with Bovie cautery.  She is morbidly obese and had a very thick layer of subcutaneous tissue and we used cerebellar retractors for added visualization.  We got to the anterior rectus sheath and then this was opened with a paramedian incision with Bovie cautery.  The left rectus muscle was identified and I raised flaps underneath the anterior rectus sheath with Bovie cautery and blunt dissection.  The left rectus muscle was mobilized to the midline and entered lateral to the muscle into the retroperitoneum and mobilized peritoneum and left ureter to the midline.  I dissected peritoneum off of the posterior rectus sheath above arcuate line.  This was opened with Metzenbaum scissors to get up to the L4-5 disc space.  A wet lap pad was placed in the wound with a Balfour retractor.  Dr. Yevette Edwards used hand-held Wiley retractors to pull the peritoneum and left ureter to the midline.  Initially went to the L5-S1 disc space.  The left iliac  vein was pretty scarred here given previous posterior instrumentation.  I was able to get the middle sacral branch off the left iliac vein ligated with a 2-0 silk tie and this was divided.  The middle sacral vessels remaining were ligated between clips and divided.  Mobilized on both sides of the disc space until  we had good working room at L5-S1.  I then went up one level and Dr. Yevette Edwards used hand-held Wiley retractors to pull the left iliac artery toward the midline and I visualized the left iliac vein.  One iliolumbar branch was divided between 2-0 silk ties and divided.  I continued to fully mobilized to the midline mobilizing the iliac artery and vein toward the midline to like to get to the other side of the disc space. Initially went and put the retractors at L5-S1 with the globus retractor on the field.  A 200 length reverse lip was placed to the right of the disc base with a 180 reverse lip to the left of the disc space.  I placed a 120 cranial and a 160 caudal.  We had good anterior exposure.  Spinal needle was placed in the disc space and confirmed we were at the L5-S1 level.  Case was turned over to Dr. Yevette Edwards.  I was called back into the OR after the implant was completed at L5-S1.  The retractors were removed and I did not see any active bleeding.  I went up one level to the L4-5 disc space that had already been mobilized.  I used a 180 reverse lip to the right of the disc space to pull the left iliac vessels lateral and a 160 reverse lip to the left of the disc space and then 140 reverse lips cranial and caudal.  We had good anterior exposure at L4-L5.  Case was turned over to Dr. Yevette Edwards.  Complication: None  Condition: Stable  Cephus Shelling, MD [facility] Office: [phone]   Cephus Shelling

## 2023-03-27 ENCOUNTER — Inpatient Hospital Stay (HOSPITAL_COMMUNITY): Payer: 59

## 2023-03-27 ENCOUNTER — Inpatient Hospital Stay (HOSPITAL_COMMUNITY): Admission: RE | Admit: 2023-03-27 | Payer: Medicaid Other | Source: Home / Self Care | Admitting: Orthopedic Surgery

## 2023-03-27 ENCOUNTER — Inpatient Hospital Stay (HOSPITAL_COMMUNITY): Payer: 59 | Admitting: Anesthesiology

## 2023-03-27 ENCOUNTER — Encounter (HOSPITAL_COMMUNITY): Payer: Self-pay | Admitting: Orthopedic Surgery

## 2023-03-27 ENCOUNTER — Inpatient Hospital Stay (HOSPITAL_COMMUNITY)
Admission: RE | Disposition: A | Discharge status: Home / Self Care | Payer: Self-pay | Source: Home / Self Care | Attending: Orthopedic Surgery

## 2023-03-27 DIAGNOSIS — F17210 Nicotine dependence, cigarettes, uncomplicated: Secondary | ICD-10-CM

## 2023-03-27 DIAGNOSIS — M4807 Spinal stenosis, lumbosacral region: Secondary | ICD-10-CM

## 2023-03-27 DIAGNOSIS — I10 Essential (primary) hypertension: Secondary | ICD-10-CM

## 2023-03-27 DIAGNOSIS — J449 Chronic obstructive pulmonary disease, unspecified: Secondary | ICD-10-CM

## 2023-03-27 LAB — GLUCOSE, CAPILLARY: Glucose-Capillary: 168 mg/dL — ABNORMAL HIGH (ref 70–99)

## 2023-03-27 SURGERY — POSTERIOR LUMBAR FUSION 2 LEVEL
Anesthesia: General

## 2023-03-27 MED ORDER — HYDROMORPHONE HCL 1 MG/ML IJ SOLN
0.2500 mg | INTRAMUSCULAR | Status: DC | PRN
  Administered 2023-03-27 (×4): 0.5 mg via INTRAVENOUS

## 2023-03-27 MED ORDER — VANCOMYCIN HCL 1000 MG IV SOLR
INTRAVENOUS | Status: DC | PRN
  Administered 2023-03-27: 1000 mg via INTRAVENOUS

## 2023-03-27 MED ORDER — THROMBIN (RECOMBINANT) 20000 UNITS EX SOLR
CUTANEOUS | Status: AC
  Filled 2023-03-27: qty 20000

## 2023-03-27 MED ORDER — PROMETHAZINE HCL 25 MG/ML IJ SOLN: 6.2500 mg | INTRAMUSCULAR | Status: DC | PRN

## 2023-03-27 MED ORDER — BUPIVACAINE-EPINEPHRINE (PF) 0.25% -1:200000 IJ SOLN
INTRAMUSCULAR | Status: AC
  Filled 2023-03-27: qty 30

## 2023-03-27 MED ORDER — LIDOCAINE 2% (20 MG/ML) 5 ML SYRINGE
INTRAMUSCULAR | Status: DC | PRN
  Administered 2023-03-27: 40 mg via INTRAVENOUS

## 2023-03-27 MED ORDER — LABETALOL HCL 5 MG/ML IV SOLN
INTRAVENOUS | Status: DC | PRN
  Administered 2023-03-27: 5 mg via INTRAVENOUS

## 2023-03-27 MED ORDER — DEXAMETHASONE SODIUM PHOSPHATE 10 MG/ML IJ SOLN
INTRAMUSCULAR | Status: DC | PRN
  Administered 2023-03-27: 5 mg via INTRAVENOUS

## 2023-03-27 MED ORDER — HYDROMORPHONE HCL 1 MG/ML IJ SOLN
INTRAMUSCULAR | Status: AC
  Filled 2023-03-27: qty 1

## 2023-03-27 MED ORDER — FENTANYL CITRATE (PF) 250 MCG/5ML IJ SOLN
INTRAMUSCULAR | Status: AC
  Filled 2023-03-27: qty 5

## 2023-03-27 MED ORDER — BUPIVACAINE-EPINEPHRINE 0.25% -1:200000 IJ SOLN
INTRAMUSCULAR | Status: DC | PRN
  Administered 2023-03-27: 20 mL
  Administered 2023-03-27: 10 mL

## 2023-03-27 MED ORDER — MEPERIDINE HCL 25 MG/ML IJ SOLN: 6.2500 mg | INTRAMUSCULAR | Status: DC | PRN

## 2023-03-27 MED ORDER — ALBUTEROL SULFATE (2.5 MG/3ML) 0.083% IN NEBU
2.5000 mg | INHALATION_SOLUTION | Freq: Once | RESPIRATORY_TRACT | Status: AC
  Administered 2023-03-27: 2.5 mg via RESPIRATORY_TRACT

## 2023-03-27 MED ORDER — THROMBIN 20000 UNITS EX SOLR
OROMUCOSAL | Status: DC | PRN
  Administered 2023-03-27: 20 mL via TOPICAL

## 2023-03-27 MED ORDER — ROCURONIUM BROMIDE 10 MG/ML (PF) SYRINGE
PREFILLED_SYRINGE | INTRAVENOUS | Status: AC
  Filled 2023-03-27: qty 10

## 2023-03-27 MED ORDER — ROCURONIUM BROMIDE 10 MG/ML (PF) SYRINGE
PREFILLED_SYRINGE | INTRAVENOUS | Status: DC | PRN
  Administered 2023-03-27 (×2): 20 mg via INTRAVENOUS
  Administered 2023-03-27: 60 mg via INTRAVENOUS

## 2023-03-27 MED ORDER — BUPIVACAINE LIPOSOME 1.3 % IJ SUSP
INTRAMUSCULAR | Status: DC | PRN
  Administered 2023-03-27: 20 mL

## 2023-03-27 MED ORDER — PHENYLEPHRINE 80 MCG/ML (10ML) SYRINGE FOR IV PUSH (FOR BLOOD PRESSURE SUPPORT)
PREFILLED_SYRINGE | INTRAVENOUS | Status: AC
  Filled 2023-03-27: qty 10

## 2023-03-27 MED ORDER — CHLORHEXIDINE GLUCONATE CLOTH 2 % EX PADS
6.0000 | MEDICATED_PAD | Freq: Once | CUTANEOUS | Status: AC
  Administered 2023-03-27: 6 via TOPICAL

## 2023-03-27 MED ORDER — BUPIVACAINE LIPOSOME 1.3 % IJ SUSP
INTRAMUSCULAR | Status: AC
  Filled 2023-03-27: qty 20

## 2023-03-27 MED ORDER — BACITRACIN ZINC 500 UNIT/GM EX OINT
TOPICAL_OINTMENT | CUTANEOUS | Status: AC
  Filled 2023-03-27: qty 28.35

## 2023-03-27 MED ORDER — PROPOFOL 10 MG/ML IV BOLUS
INTRAVENOUS | Status: DC | PRN
  Administered 2023-03-27: 150 mg via INTRAVENOUS

## 2023-03-27 MED ORDER — ACETAMINOPHEN 10 MG/ML IV SOLN
1000.0000 mg | Freq: Once | INTRAVENOUS | Status: DC | PRN
  Administered 2023-03-27: 1000 mg via INTRAVENOUS

## 2023-03-27 MED ORDER — DEXAMETHASONE SODIUM PHOSPHATE 10 MG/ML IJ SOLN
INTRAMUSCULAR | Status: AC
  Filled 2023-03-27: qty 1

## 2023-03-27 MED ORDER — LIDOCAINE 2% (20 MG/ML) 5 ML SYRINGE
INTRAMUSCULAR | Status: AC
  Filled 2023-03-27: qty 5

## 2023-03-27 MED ORDER — ONDANSETRON HCL 4 MG/2ML IJ SOLN
INTRAMUSCULAR | Status: AC
  Filled 2023-03-27: qty 2

## 2023-03-27 MED ORDER — FENTANYL CITRATE (PF) 250 MCG/5ML IJ SOLN
INTRAMUSCULAR | Status: DC | PRN
  Administered 2023-03-27 (×3): 50 ug via INTRAVENOUS
  Administered 2023-03-27: 100 ug via INTRAVENOUS

## 2023-03-27 MED ORDER — MIDAZOLAM HCL 2 MG/2ML IJ SOLN
INTRAMUSCULAR | Status: DC | PRN
  Administered 2023-03-27: 2 mg via INTRAVENOUS

## 2023-03-27 MED ORDER — 0.9 % SODIUM CHLORIDE (POUR BTL) OPTIME
TOPICAL | Status: DC | PRN
  Administered 2023-03-27: 1000 mL

## 2023-03-27 MED ORDER — PROPOFOL 10 MG/ML IV BOLUS
INTRAVENOUS | Status: AC
  Filled 2023-03-27: qty 20

## 2023-03-27 MED ORDER — LABETALOL HCL 5 MG/ML IV SOLN
INTRAVENOUS | Status: AC
  Filled 2023-03-27: qty 4

## 2023-03-27 MED ORDER — ACETAMINOPHEN 10 MG/ML IV SOLN
INTRAVENOUS | Status: AC
  Filled 2023-03-27: qty 100

## 2023-03-27 MED ORDER — MIDAZOLAM HCL 2 MG/2ML IJ SOLN
INTRAMUSCULAR | Status: AC
  Filled 2023-03-27: qty 2

## 2023-03-27 MED ORDER — PHENYLEPHRINE 80 MCG/ML (10ML) SYRINGE FOR IV PUSH (FOR BLOOD PRESSURE SUPPORT)
PREFILLED_SYRINGE | INTRAVENOUS | Status: DC | PRN
  Administered 2023-03-27: 80 ug via INTRAVENOUS
  Administered 2023-03-27 (×2): 160 ug via INTRAVENOUS

## 2023-03-27 MED ORDER — ALBUTEROL SULFATE (2.5 MG/3ML) 0.083% IN NEBU
INHALATION_SOLUTION | RESPIRATORY_TRACT | Status: AC
  Filled 2023-03-27: qty 3

## 2023-03-27 MED ORDER — LACTATED RINGERS IV SOLN: INTRAVENOUS | Status: DC | PRN

## 2023-03-27 MED ORDER — SUGAMMADEX SODIUM 200 MG/2ML IV SOLN
INTRAVENOUS | Status: DC | PRN
  Administered 2023-03-27: 400 mg via INTRAVENOUS

## 2023-03-27 MED ORDER — PHENYLEPHRINE HCL-NACL 20-0.9 MG/250ML-% IV SOLN
INTRAVENOUS | Status: DC | PRN
  Administered 2023-03-27: 25 ug/min via INTRAVENOUS

## 2023-03-27 SURGICAL SUPPLY — 79 items
APL SKNCLS STERI-STRIP NONHPOA (GAUZE/BANDAGES/DRESSINGS) ×1
BAG COUNTER SPONGE SURGICOUNT (BAG) ×1 IMPLANT
BAG SPNG CNTER NS LX DISP (BAG) ×1
BARRIER SKIN 2 1/4 (WOUND CARE) ×2 IMPLANT
BARRIER SKIN OD1.75 2 1/4 FLNG (WOUND CARE) IMPLANT
BENZOIN TINCTURE PRP APPL 2/3 (GAUZE/BANDAGES/DRESSINGS) IMPLANT
BONE VIVIGEN FORMABLE 1.3CC (Bone Implant) ×1 IMPLANT
BRR SKN FLT 1.75X2.25 2 PC (WOUND CARE) ×2
BUR ROUND FLUTED 5 RND (BURR) IMPLANT
CLSR STERI-STRIP ANTIMIC 1/2X4 (GAUZE/BANDAGES/DRESSINGS) IMPLANT
CNTNR URN SCR LID CUP LEK RST (MISCELLANEOUS) ×1 IMPLANT
CONT SPEC 4OZ STRL OR WHT (MISCELLANEOUS) ×1
COVER SURGICAL LIGHT HANDLE (MISCELLANEOUS) ×1 IMPLANT
DRAIN CHANNEL 15F RND FF W/TCR (WOUND CARE) ×1 IMPLANT
DRAPE C-ARM 42X72 X-RAY (DRAPES) ×1 IMPLANT
DRAPE POUCH INSTRU U-SHP 10X18 (DRAPES) ×1 IMPLANT
DRAPE SURG 17X23 STRL (DRAPES) ×4 IMPLANT
DURAPREP 26ML APPLICATOR (WOUND CARE) ×1 IMPLANT
ELECT BLADE 4.0 EZ CLEAN MEGAD (MISCELLANEOUS) ×1
ELECT CAUTERY BLADE 6.4 (BLADE) ×2 IMPLANT
ELECT REM PT RETURN 9FT ADLT (ELECTROSURGICAL) ×1
ELECTRODE BLDE 4.0 EZ CLN MEGD (MISCELLANEOUS) ×1 IMPLANT
ELECTRODE REM PT RTRN 9FT ADLT (ELECTROSURGICAL) ×1 IMPLANT
EVACUATOR SILICONE 100CC (DRAIN) ×1 IMPLANT
GAUZE 4X4 16PLY ~~LOC~~+RFID DBL (SPONGE) ×2 IMPLANT
GAUZE SPONGE 4X4 12PLY STRL (GAUZE/BANDAGES/DRESSINGS) ×1 IMPLANT
GAUZE SPONGE 4X4 12PLY STRL LF (GAUZE/BANDAGES/DRESSINGS) IMPLANT
GLOVE BIO SURGEON STRL SZ 6.5 (GLOVE) ×1 IMPLANT
GLOVE BIO SURGEON STRL SZ8 (GLOVE) ×1 IMPLANT
GLOVE BIOGEL PI IND STRL 7.0 (GLOVE) ×1 IMPLANT
GLOVE BIOGEL PI IND STRL 8 (GLOVE) ×1 IMPLANT
GLOVE SURG ENC MOIS LTX SZ6.5 (GLOVE) ×1 IMPLANT
GOWN STRL REUS W/ TWL LRG LVL3 (GOWN DISPOSABLE) ×2 IMPLANT
GOWN STRL REUS W/ TWL XL LVL3 (GOWN DISPOSABLE) ×1 IMPLANT
GOWN STRL REUS W/TWL LRG LVL3 (GOWN DISPOSABLE) ×2
GOWN STRL REUS W/TWL XL LVL3 (GOWN DISPOSABLE) ×1
GRAFT BNE MATRIX VG FRMBL SM 1 (Bone Implant) IMPLANT
GUIDEWIRE SHARP VIPER II (WIRE) IMPLANT
IV CATH 14GX2 1/4 (CATHETERS) ×1 IMPLANT
KIT ALARA NEURO ACCESS (KITS) IMPLANT
KIT BASIN OR (CUSTOM PROCEDURE TRAY) ×1 IMPLANT
KIT POSITION SURG JACKSON T1 (MISCELLANEOUS) ×1 IMPLANT
KIT TURNOVER KIT B (KITS) ×1 IMPLANT
MARKER SKIN DUAL TIP RULER LAB (MISCELLANEOUS) ×1 IMPLANT
NDL HYPO 22X1.5 SAFETY MO (MISCELLANEOUS) IMPLANT
NDL HYPO 25GX1X1/2 BEV (NEEDLE) ×1 IMPLANT
NDL SPNL 18GX3.5 QUINCKE PK (NEEDLE) ×2 IMPLANT
NEEDLE HYPO 22X1.5 SAFETY MO (MISCELLANEOUS) ×2 IMPLANT
NEEDLE HYPO 25GX1X1/2 BEV (NEEDLE) ×1 IMPLANT
NEEDLE SPNL 18GX3.5 QUINCKE PK (NEEDLE) ×2 IMPLANT
NS IRRIG 1000ML POUR BTL (IV SOLUTION) ×1 IMPLANT
PACK LAMINECTOMY ORTHO (CUSTOM PROCEDURE TRAY) ×1 IMPLANT
PACK UNIVERSAL I (CUSTOM PROCEDURE TRAY) ×1 IMPLANT
PAD ARMBOARD 7.5X6 YLW CONV (MISCELLANEOUS) ×2 IMPLANT
PATTIES SURGICAL .5 X1 (DISPOSABLE) ×1 IMPLANT
PATTIES SURGICAL .5X1.5 (GAUZE/BANDAGES/DRESSINGS) ×1 IMPLANT
PATTIES SURGICAL .75X.75 (GAUZE/BANDAGES/DRESSINGS) ×1 IMPLANT
ROD PREBENT VIPER2 60MM (Rod) IMPLANT
SCREW POLY VIPER2 7X40MM (Screw) IMPLANT
SCREW SET SINGLE INNER MIS (Screw) IMPLANT
SCREW XTAB POLY VIPER 7X45 (Screw) IMPLANT
SUT MNCRL AB 4-0 PS2 18 (SUTURE) ×1 IMPLANT
SUT VIC AB 0 CT1 18XCR BRD 8 (SUTURE) ×2 IMPLANT
SUT VIC AB 0 CT1 8-18 (SUTURE) ×2
SUT VIC AB 1 CT1 18XCR BRD 8 (SUTURE) ×2 IMPLANT
SUT VIC AB 1 CT1 8-18 (SUTURE) ×2
SUT VIC AB 2-0 CT2 18 VCP726D (SUTURE) ×2 IMPLANT
SYR 20ML LL LF (SYRINGE) ×1 IMPLANT
SYR BULB IRRIG 60ML STRL (SYRINGE) ×1 IMPLANT
SYR CONTROL 10ML LL (SYRINGE) ×2 IMPLANT
SYR TB 1ML LUER SLIP (SYRINGE) ×1 IMPLANT
TAP CANN VIPER2 DL 6.0 (TAP) IMPLANT
TAP CANN VIPER2 DL 7.0 (TAP) IMPLANT
TAPE CLOTH SURG 4X10 WHT LF (GAUZE/BANDAGES/DRESSINGS) IMPLANT
TOWEL GREEN STERILE (TOWEL DISPOSABLE) ×1 IMPLANT
TOWEL GREEN STERILE FF (TOWEL DISPOSABLE) ×1 IMPLANT
TRAY FOLEY MTR SLVR 16FR STAT (SET/KITS/TRAYS/PACK) ×1 IMPLANT
WATER STERILE IRR 1000ML POUR (IV SOLUTION) ×1 IMPLANT
YANKAUER SUCT BULB TIP NO VENT (SUCTIONS) ×1 IMPLANT

## 2023-03-27 NOTE — Evaluation (Signed)
Physical Therapy Evaluation Patient Details Name: Alicia Blake MRN: [id number] DOB: [birth year] Today's Date: 03/27/2023  History of Present Illness  57 yo female s/p ALIF L4-S1, removal of posterior instrumentation bilat L5-S1 on 5/29; posterior spinal fusion L3-S1, L4-S1 posterior lumbar decompression and placement of posterior segmental instrumentation L4-S1 on 5/30. PMH includes bipolar disorder, GERD, preDM, PTSD, seizures, substance abuse none since 2021, previous lumbar laminectomy with nonunion 2018, L THA 2017, L ankle ORIF 2022.  Clinical Impression   Pt presents with generalized weakness, post-op back pain, decreased knowledge and application of spinal precautions, impaired standing balance, and decreased activity tolerance. Pt to benefit from acute PT to address deficits. Pt ambulated hallway distance with increased time and use of RW, cues for form and safety throughout. PT to progress mobility as tolerated, and will continue to follow acutely.         Recommendations for follow up therapy are one component of a multi-disciplinary discharge planning process, led by the attending physician.  Recommendations may be updated based on patient status, additional functional criteria and insurance authorization.  Follow Up Recommendations       Assistance Recommended at Discharge Intermittent Supervision/Assistance  Patient can return home with the following  A little help with walking and/or transfers;A little help with bathing/dressing/bathroom    Equipment Recommendations Rolling walker (2 wheels);BSC/3in1;Other (comment) (tub bench)  Recommendations for Other Services       Functional Status Assessment Patient has had a recent decline in their functional status and demonstrates the ability to make significant improvements in function in a reasonable and predictable amount of time.     Precautions / Restrictions Precautions Precautions: Fall Required Braces or Orthoses: Spinal  Brace Spinal Brace: Thoracolumbosacral orthotic;Applied in sitting position Restrictions Weight Bearing Restrictions: No      Mobility  Bed Mobility Overal bed mobility: Needs Assistance Bed Mobility: Rolling, Sidelying to Sit, Sit to Sidelying Rolling: Min assist Sidelying to sit: Min assist     Sit to sidelying: Min assist General bed mobility comments: assist for log roll technique, lifting LEs back into bed upon return to supine    Transfers Overall transfer level: Needs assistance Equipment used: Rolling walker (2 wheels) Transfers: Sit to/from Stand Sit to Stand: Min guard           General transfer comment: for safety, cues for hand placement when rising. stand x2    Ambulation/Gait Ambulation/Gait assistance: Min guard Gait Distance (Feet): 75 Feet Assistive device: Rolling walker (2 wheels) Gait Pattern/deviations: Step-through pattern, Decreased stride length, Trunk flexed Gait velocity: decr     General Gait Details: cues for placement in RW, upright posture  Stairs            Wheelchair Mobility    Modified Rankin (Stroke Patients Only)       Balance Overall balance assessment: Needs assistance Sitting-balance support: No upper extremity supported, Feet supported Sitting balance-Leahy Scale: Fair     Standing balance support: Bilateral upper extremity supported, During functional activity Standing balance-Leahy Scale: Poor Standing balance comment: reliant on external support                             Pertinent Vitals/Pain Pain Assessment Pain Assessment: 0-10 Pain Score: 6  Pain Location: back, posterior and lateral legs Pain Descriptors / Indicators: Sore, Discomfort Pain Intervention(s): Limited activity within patient's tolerance, Monitored during session, Repositioned    Home Living Family/patient expects to be discharged  to:: Private residence Living Arrangements: Alone Available Help at Discharge: Family  (mom to stay with pt at d/c)   Home Access: Stairs to enter   Entrance Stairs-Number of Steps: 2 Alternate Level Stairs-Number of Steps: plans to live on the first floor Home Layout: Two level;1/2 bath on main level Home Equipment: Cane - single point Additional Comments: states her tub bench is "bent", wants an Art gallery manager    Prior Function Prior Level of Function : Independent/Modified Independent             Mobility Comments: pt reports using cane for gait PTA ADLs Comments: independent     Hand Dominance   Dominant Hand: Right    Extremity/Trunk Assessment   Upper Extremity Assessment Upper Extremity Assessment: Defer to OT evaluation    Lower Extremity Assessment Lower Extremity Assessment: Generalized weakness    Cervical / Trunk Assessment Cervical / Trunk Assessment: Back Surgery  Communication   Communication: No difficulties  Cognition Arousal/Alertness: Awake/alert Behavior During Therapy: WFL for tasks assessed/performed Overall Cognitive Status: Within Functional Limits for tasks assessed                                          General Comments      Exercises     Assessment/Plan    PT Assessment Patient needs continued PT services  PT Problem List Decreased strength;Decreased mobility;Decreased activity tolerance;Decreased balance;Decreased knowledge of precautions;Decreased knowledge of use of DME;Pain       PT Treatment Interventions DME instruction;Therapeutic activities;Gait training;Therapeutic exercise;Patient/family education;Balance training;Stair training;Functional mobility training;Neuromuscular re-education    PT Goals (Current goals can be found in the Care Plan section)  Acute Rehab PT Goals Patient Stated Goal: home PT Goal Formulation: With patient Time For Goal Achievement: 04/10/23 Potential to Achieve Goals: Good    Frequency Min 5X/week     Co-evaluation               AM-PAC PT "6  Clicks" Mobility  Outcome Measure Help needed turning from your back to your side while in a flat bed without using bedrails?: A Little Help needed moving from lying on your back to sitting on the side of a flat bed without using bedrails?: A Little Help needed moving to and from a bed to a chair (including a wheelchair)?: A Little Help needed standing up from a chair using your arms (e.g., wheelchair or bedside chair)?: A Little Help needed to walk in hospital room?: A Little Help needed climbing 3-5 steps with a railing? : A Lot 6 Click Score: 17    End of Session Equipment Utilized During Treatment: Back brace Activity Tolerance: Patient tolerated treatment well Patient left: in bed;with call bell/phone within reach;with family/visitor present Nurse Communication: Mobility status PT Visit Diagnosis: Other abnormalities of gait and mobility (R26.89);Muscle weakness (generalized) (M62.81)    Time: 4098-1191 PT Time Calculation (min) (ACUTE ONLY): 22 min   Charges:   PT Evaluation $PT Eval Low Complexity: 1 Low          Shyann Hefner S, PT DPT Acute Rehabilitation Services Secure Chat Preferred  Office [phone]   Truddie Coco 03/27/2023, 4:29 PM

## 2023-03-27 NOTE — Progress Notes (Signed)
PT Cancellation Note  Patient Details Name: Alicia Blake MRN: [id number] DOB: 26-Jan-1966   Cancelled Treatment:    Reason Eval/Treat Not Completed: Patient at procedure or test/unavailable. Pt currently in OR for second stage of surgery. Will check back as schedule allows to initiate PT evaluation.    Marylynn Pearson 03/27/2023, 8:29 AM  Conni Slipper, PT, DPT Acute Rehabilitation Services Secure Chat Preferred Office: [phone]

## 2023-03-27 NOTE — Evaluation (Signed)
Occupational Therapy Evaluation Patient Details Name: Alicia Blake MRN: [id number] DOB: 19-May-1966 Today's Date: 03/27/2023   History of Present Illness 57 yo female s/p ALIF L4-S1, removal of posterior instrumentation bilat L5-S1 on 5/29; posterior spinal fusion L3-S1, L4-S1 posterior lumbar decompression and placement of posterior segmental instrumentation L4-S1 on 5/30. PMH includes bipolar disorder, GERD, preDM, PTSD, seizures, substance abuse none since 2021, previous lumbar laminectomy with nonunion 2018, L THA 2017, L ankle ORIF 2022.   Clinical Impression   Pt admitted for above dx, PTA patient lived alone but mother will be home to provide assist upon DC. Pt reports using cane to ambulate and being independent in bADLs/iADLs PTA. Pt currently demonstrating good carryover of log roll technique and recalls 3/3 POB precautions. Reinforced back precautions, pt successfully doffed/donned bilat socks with fig four to perform lower body dressing but has some difficulty with position. Instructed pt to complete hip flexor stretches to allow for more fluid movement to perform dressing. Educated pt on functional car transfers, pt verbalized understanding, also educated pt on how to don/doff brace which they needed min A for new learning the proper sequence. Pt would benefit from continued acute skilled OT services to address above deficits and help transition to next level of care. No follow-up OT recommend     Recommendations for follow up therapy are one component of a multi-disciplinary discharge planning process, led by the attending physician.  Recommendations may be updated based on patient status, additional functional criteria and insurance authorization.   Assistance Recommended at Discharge Set up Supervision/Assistance  Patient can return home with the following Assist for transportation;Help with stairs or ramp for entrance    Functional Status Assessment  Patient has had a recent  decline in their functional status and demonstrates the ability to make significant improvements in function in a reasonable and predictable amount of time.  Equipment Recommendations  Tub/shower bench;BSC/3in1;Other (comment) (RW)    Recommendations for Other Services       Precautions / Restrictions Precautions Precautions: Fall Required Braces or Orthoses: Spinal Brace Spinal Brace: Thoracolumbosacral orthotic;Applied in sitting position Restrictions Weight Bearing Restrictions: No      Mobility Bed Mobility Overal bed mobility: Needs Assistance Bed Mobility: Sit to Supine, Sidelying to Sit   Sidelying to sit: Supervision, HOB elevated Supine to sit: Supervision, HOB elevated     General bed mobility comments: Pt demonstrating good carryover of log roll to maintain back precautions with bed mobility. Pt using rails to assist    Transfers Overall transfer level: Needs assistance Equipment used: Rolling walker (2 wheels) Transfers: Sit to/from Stand Sit to Stand: Supervision                  Balance Overall balance assessment: Needs assistance Sitting-balance support: No upper extremity supported, Feet supported Sitting balance-Leahy Scale: Fair     Standing balance support: Bilateral upper extremity supported, During functional activity Standing balance-Leahy Scale: Poor                             ADL either performed or assessed with clinical judgement   ADL Overall ADL's : Needs assistance/impaired Eating/Feeding: Independent;Sitting   Grooming: Standing;Min guard   Upper Body Bathing: Independent;Sitting   Lower Body Bathing: Min guard;Sitting/lateral leans   Upper Body Dressing : Sitting;Independent   Lower Body Dressing: Sitting/lateral leans;Min guard   Toilet Transfer: Ambulation;Min guard;Rolling walker (2 wheels)   Toileting- Clothing Manipulation and Hygiene:  Supervision/safety;Sitting/lateral lean   Tub/ Shower Transfer:  Min guard   Functional mobility during ADLs: Min guard;Rolling walker (2 wheels) General ADL Comments: Pt progressed to close supervision with hall level ambulation by the end of OT sessions     Vision         Perception     Praxis      Pertinent Vitals/Pain Pain Assessment Pain Assessment: 0-10 Pain Score: 5  Pain Location: back, posterior and lateral legs Pain Descriptors / Indicators: Sore, Discomfort Pain Intervention(s): Limited activity within patient's tolerance, Monitored during session     Hand Dominance Right   Extremity/Trunk Assessment Upper Extremity Assessment Upper Extremity Assessment: Overall WFL for tasks assessed   Lower Extremity Assessment Lower Extremity Assessment: Generalized weakness   Cervical / Trunk Assessment Cervical / Trunk Assessment: Back Surgery   Communication Communication Communication: No difficulties   Cognition Arousal/Alertness: Awake/alert Behavior During Therapy: WFL for tasks assessed/performed Overall Cognitive Status: Within Functional Limits for tasks assessed                                       General Comments       Exercises     Shoulder Instructions      Home Living Family/patient expects to be discharged to:: Private residence Living Arrangements: Alone Available Help at Discharge: Family (mom to stay with pt at d/c) Type of Home: House Home Access: Stairs to enter [employer] of Steps: 2   Home Layout: Two level;1/2 bath on main level Alternate Level Stairs-Number of Steps: plans to live on the first floor   Bathroom Shower/Tub: Chief Strategy Officer: Standard Bathroom Accessibility: Yes   Home Equipment: Cane - single point;Shower seat   Additional Comments: states her tub seat is "broke", wants an Art gallery manager      Prior Functioning/Environment Prior Level of Function : Independent/Modified Independent             Mobility Comments: pt  reports using cane for gait PTA ADLs Comments: independent        OT Problem List: Decreased strength;Decreased activity tolerance;Impaired balance (sitting and/or standing);Pain      OT Treatment/Interventions: Self-care/ADL training;Therapeutic exercise;DME and/or AE instruction;Energy conservation;Therapeutic activities;Patient/family education;Balance training    OT Goals(Current goals can be found in the care plan section) Acute Rehab OT Goals Patient Stated Goal: To go home OT Goal Formulation: With patient Time For Goal Achievement: 04/10/23 Potential to Achieve Goals: Good ADL Goals Pt Will Perform Tub/Shower Transfer: tub bench;with supervision Additional ADL Goal #1: Pt will independently demonstrate compliance with hip flexor stretching in preparation for lower body dressing  OT Frequency: Min 2X/week    Co-evaluation              AM-PAC OT "6 Clicks" Daily Activity     Outcome Measure Help from another person eating meals?: None Help from another person taking care of personal grooming?: A Little Help from another person toileting, which includes using toliet, bedpan, or urinal?: A Little Help from another person bathing (including washing, rinsing, drying)?: A Little Help from another person to put on and taking off regular upper body clothing?: None Help from another person to put on and taking off regular lower body clothing?: A Little 6 Click Score: 20   End of Session Equipment Utilized During Treatment: Rolling walker (2 wheels);Gait belt;Back brace Nurse Communication: Mobility status  Activity  Tolerance: Patient tolerated treatment well Patient left: in bed;with call bell/phone within reach;with family/visitor present  OT Visit Diagnosis: Unsteadiness on feet (R26.81);Other abnormalities of gait and mobility (R26.89);Pain Pain - part of body:  (back)                Time: 4098-1191 OT Time Calculation (min): 24 min Charges:  OT General Charges $OT  Visit: 1 Visit OT Evaluation $OT Eval Moderate Complexity: 1 Mod OT Treatments $Self Care/Home Management : 8-22 mins  03/27/2023  AB, OTR/L  Acute Rehabilitation Services  Office: [phone]   Tristan Schroeder 03/27/2023, 6:09 PM

## 2023-03-27 NOTE — Anesthesia Postprocedure Evaluation (Signed)
Anesthesia Post Note  Patient: ARASELI BAXTER  Procedure(s) Performed: POSTERIOR DECOMPRESSION AND FUSION LUMBAR 4- LUMBAR 5, LUMBAR 5- SACRUM 1 WITH INSTRUMENTATION AND ALLOGRAFT     Patient location during evaluation: PACU Anesthesia Type: General Level of consciousness: awake and sedated Pain management: pain level not controlled Vital Signs Assessment: post-procedure vital signs reviewed and stable Respiratory status: spontaneous breathing Cardiovascular status: stable Postop Assessment: no apparent nausea or vomiting Anesthetic complications: no  There were no known notable events for this encounter.  Last Vitals:  Vitals:   03/27/23 1122 03/27/23 1543  BP:  124/70  Pulse: 92 91  Resp:  16  Temp:  37.1 C  SpO2: 95% 96%    Last Pain:  Vitals:   03/27/23 1543  TempSrc: Oral  PainSc:                  Caren Macadam

## 2023-03-27 NOTE — Progress Notes (Signed)
OT Cancellation Note  Patient Details Name: Alicia Blake MRN: [id number] DOB: Aug 05, 1966   Cancelled Treatment:    Reason Eval/Treat Not Completed: Patient at procedure or test/ unavailable (In the OR this AM for posterior lumbar decompression and fusion. Unable to participate in OT eval. Will continue to follow up with pt and evaluate when able.)  Limmie Patricia, OTR/L,CBIS  Supplemental OT - [facility] Secure Chat Preferred   03/27/2023, 8:11 AM

## 2023-03-27 NOTE — Progress Notes (Addendum)
  Progress Note    03/27/2023 6:41 AM 1 Day Post-Op  Subjective:  pt seen in pre op; no complaints; wants to tell Dr. Chestine Spore he did a good job  afebrile  Vitals:   03/26/23 2246 03/27/23 0410  BP: 120/76 139/75  Pulse: 92 (!) 112  Resp: 18 16  Temp: 98.9 F (37.2 C) 98 F (36.7 C)  SpO2: 99% 98%    Physical Exam: General:  no distress Lungs:  non labored Incisions:  bandage is clean Extremities:  palpable DP pulses bilaterally Abdomen:  soft, NT  CBC    Component Value Date/Time   WBC 12.7 (H) 03/18/2023 1130   RBC 4.89 03/18/2023 1130   HGB 14.2 03/18/2023 1130   HCT 44.5 03/18/2023 1130   PLT 253 03/18/2023 1130   MCV 91.0 03/18/2023 1130   MCH 29.0 03/18/2023 1130   MCHC 31.9 03/18/2023 1130   RDW 15.1 03/18/2023 1130   LYMPHSABS 1.0 12/08/2022 0013   MONOABS 0.4 12/08/2022 0013   EOSABS 0.2 12/08/2022 0013   BASOSABS 0.0 12/08/2022 0013    BMET    Component Value Date/Time   NA 135 03/18/2023 1130   K 3.4 (L) 03/18/2023 1130   CL 98 03/18/2023 1130   CO2 29 03/18/2023 1130   GLUCOSE 97 03/18/2023 1130   BUN 10 03/18/2023 1130   CREATININE 0.95 03/18/2023 1130   CREATININE 0.87 03/15/2021 1503   CALCIUM 9.1 03/18/2023 1130   GFRNONAA >60 03/18/2023 1130   GFRNONAA 75 03/15/2021 1503   GFRAA 87 03/15/2021 1503    INR    Component Value Date/Time   INR 1.01 05/07/2016 1111     Intake/Output Summary (Last 24 hours) at 03/27/2023 0641 Last data filed at 03/27/2023 0400 Gross per 24 hour  Intake 3590 ml  Output 2050 ml  Net 1540 ml      Assessment/Plan:  57 y.o. female is s/p:  : Anterior spine exposure for L4-L5 and L5-S1 ALIF via anterior retroperitoneal approach   1 Day Post-Op   -pt doing well.  For surgery today with Dr. Yevette Edwards. -pt with palpable DP pulses bilaterally and abdomen is soft.  -follow up with vascular as needed.     Doreatha Massed, PA-C [facility] [phone] 03/27/2023 6:41 AM  I have  seen and evaluated the patient. I agree with the PA note as documented above.  Postop day 1 status post anterior retroperitoneal spine exposure for L4-L5 and L5-S1 ALIF.  States she walked last night.  Appropriate postop abdominal incisional tenderness.  Left DP palpable.  Looks good from my standpoint.  No nausea vomiting.  Going back to the OR today for posterior intervention with Dr. Yevette Edwards.  Cephus Shelling, MD [facility] [place] Office: [phone]

## 2023-03-27 NOTE — H&P (Signed)
Patient tolerated stage 1 of her procedure yesterday very well.  This morning, she denies pain in her back or legs.  She does present today for stage 2 of her procedure, specifically, a posterior lumbar decompression and fusion.  We will proceed as planned.

## 2023-03-27 NOTE — Transfer of Care (Signed)
Immediate Anesthesia Transfer of Care Note  Patient: Alicia Blake  Procedure(s) Performed: POSTERIOR DECOMPRESSION AND FUSION LUMBAR 4- LUMBAR 5, LUMBAR 5- SACRUM 1 WITH INSTRUMENTATION AND ALLOGRAFT  Patient Location: PACU  Anesthesia Type:General  Level of Consciousness: awake, alert , and oriented  Airway & Oxygen Therapy: Patient connected to face mask oxygen  Post-op Assessment: Report given to RN and Post -op Vital signs reviewed and stable  Post vital signs: Reviewed and stable  Last Vitals:  Vitals Value Taken Time  BP 133/50   Temp    Pulse 92   Resp    SpO2 99     Last Pain:  Vitals:   03/27/23 0500  TempSrc:   PainSc: 7       Patients Stated Pain Goal: 3 (03/27/23 0500)  Complications: There were no known notable events for this encounter.

## 2023-03-27 NOTE — Anesthesia Procedure Notes (Signed)
Procedure Name: Intubation Date/Time: 03/27/2023 7:44 AM  Performed by: Sharyn Dross, CRNAPre-anesthesia Checklist: Patient identified, Emergency Drugs available, Suction available and Patient being monitored Patient Re-evaluated:Patient Re-evaluated prior to induction Oxygen Delivery Method: Circle system utilized Preoxygenation: Pre-oxygenation with 100% oxygen Induction Type: IV induction Ventilation: Mask ventilation without difficulty and Oral airway inserted - appropriate to patient size Laryngoscope Size: Glidescope and 4 Grade View: Grade II Tube type: Oral Tube size: 7.0 mm Number of attempts: 2 Airway Equipment and Method: Oral airway and Rigid stylet Placement Confirmation: ETT inserted through vocal cords under direct vision, positive ETCO2 and breath sounds checked- equal and bilateral Secured at: 22 cm Tube secured with: Tape Dental Injury: Teeth and Oropharynx as per pre-operative assessment  Comments: 1st attempt with MAC 4 unsuccessful. Easy mask with an oral airway. Intubated using a Glidescope without difficulty. AE

## 2023-03-27 NOTE — Op Note (Signed)
PATIENT NAME: Alicia Blake   MEDICAL RECORD NO.:   [id number]    DATE OF BIRTH: 12/03/1965   DATE OF PROCEDURE: 03/27/2023                               OPERATIVE REPORT     PREOPERATIVE DIAGNOSES: 1.  Status post L4-5 and L5-S1 anterior lumbar fusion on 03/26/2023, requiring a posterior decompression and fusion with instrumentation   POSTOPERATIVE DIAGNOSES:   1.  Status post L4-5 and L5-S1 anterior lumbar fusion on 03/26/2023, requiring a posterior decompression and fusion with instrumentation     PROCEDURE (Stage 2): 1. Posterior spinal fusion, L4-5, L5-S1. 2.  L4-5, L5-S1 posterior lumbar decompression 3. Placement of posterior segmental instrumentation, L4, L5, S1 bilaterally. 4. Use of morselized allograft - Vivigen. 5. Intraoperative use of floroscopy   SURGEON:  Estill Bamberg, MD   ASSISTANT:  Jason Coop, PA-C   ANESTHESIA:  General endotracheal anesthesia.   COMPLICATIONS:  None.   DISPOSITION:  Stable.   ESTIMATED BLOOD LOSS:  Minimal   INDICATIONS FOR SURGERY: Briefly, Ms. Bean is one day status post an anterior lumbar fusion as noted above.  Please refer to my operative report dated 03/26/2023, for a full account of the patient's preoperative history and indications for surgery.  The patient did present today for stage 2 of what was to be a 2-staged procedure.   OPERATIVE DETAILS:  On 03/27/2023, the patient was brought to surgery and general endotracheal anesthesia was administered.  The patient was placed prone onto a Jackson spinal bed.  The back was then prepped and draped in the usual sterile fashion.  I then made paramedian incisions on the right and left sides, just lateral to the lateral borders of the pedicles at L4-5 and L5-S1.  On the left side, the lateral recess at L4-5 and L5-S1 was identified, and a partial facetectomy and foraminotomy was performed using a high-speed bur.  I was very pleased with the decompression.  At this point, the  posterolateral gutter and posterior elements at the L4-5 and L5-S1 levels were identified and exposed and decorticated. Vivigen was packed into the posterolateral gutter on the left side to aid in the success of the posterior fusion.  I then tapped the L4, L5, and S1 pedicles bilaterally using a 6 mm tap.  I then placed 7 mm screws bilaterally at S1, L4 and L5.  Rods were then secured into the tulip heads of the screws bilaterally.  Caps were then placed and a final locking procedure was performed.  I was very pleased with the final AP and lateral fluoroscopic images.  The wound was then copiously irrigated.  On the right and left sides, the fascia was closed using #1 Vicryl.  The subcutaneous layer was closed using 0 Vicryl followed by 2-0 Vicryl, and the skin was then closed using 2-0 nylon. Bacitracin and a sterile dressing was then applied bilaterally.  All instrument counts were correct at the termination of the procedure.    Of note, Jason Coop was my assistant throughout surgery, and did aid in retraction, suctioning, and closure for both the entire procedure.     Estill Bamberg, MD

## 2023-03-28 ENCOUNTER — Inpatient Hospital Stay (HOSPITAL_COMMUNITY): Payer: 59

## 2023-03-28 DIAGNOSIS — Z981 Arthrodesis status: Secondary | ICD-10-CM | POA: Diagnosis not present

## 2023-03-28 DIAGNOSIS — Z4789 Encounter for other orthopedic aftercare: Secondary | ICD-10-CM | POA: Diagnosis not present

## 2023-03-28 MED ORDER — OXYCODONE-ACETAMINOPHEN 5-325 MG PO TABS: 1.0000 | ORAL_TABLET | ORAL | 0 refills | Status: DC | PRN

## 2023-03-28 MED ORDER — METHOCARBAMOL 500 MG PO TABS: 500.0000 mg | ORAL_TABLET | Freq: Four times a day (QID) | ORAL | 2 refills | Status: DC | PRN

## 2023-03-28 MED FILL — Thrombin (Recombinant) For Soln 20000 Unit: CUTANEOUS | Qty: 1 | Status: AC

## 2023-03-28 NOTE — Progress Notes (Signed)
Patient alert and oriented, mae's well, voiding adequate amount of urine, swallowing without difficulty, no c/o pain at time of discharge. Patient discharged home with family. Script and discharged instructions given to patient. Patient and family stated understanding of instructions given. Patient has an appointment with Dr. Dumonski 

## 2023-03-28 NOTE — Progress Notes (Signed)
Physical Therapy Treatment Patient Details Name: Alicia Blake MRN: [id number] DOB: December 29, 1965 Today's Date: 03/28/2023   History of Present Illness 57 yo female s/p ALIF L4-S1, removal of posterior instrumentation bilat L5-S1 on 5/29; posterior spinal fusion L3-S1, L4-S1 posterior lumbar decompression and placement of posterior segmental instrumentation L4-S1 on 5/30. PMH includes bipolar disorder, GERD, preDM, PTSD, seizures, substance abuse none since 2021, previous lumbar laminectomy with nonunion 2018, L THA 2017, L ankle ORIF 2022.    PT Comments    Pt presented supine with HOB elevated and family present in the room. Discussed spinal precautions and set up for TSLO. Pt required min assist with rolling to the left and verbal cue for rolling technique. Required min guarding from supine to EOB, cued for foot placement. Demonstrated fair ability for sit to stand; verbally cued for hand placement on the walker. Pt improved with endurance with gait, able to ambulate 480 feet; requiring one standing rest break. Demonstrated fair ability to navigate staircase sideways with rail assistance; required verbal cue for foot placement and spinal precautions. End of session, pt left EOB with family present to assist in dressing; briefly reviewed spinal precautions and handout provided. Anticipating post acute rehab to address facilitating improvements in functional mobility.     Recommendations for follow up therapy are one component of a multi-disciplinary discharge planning process, led by the attending physician.  Recommendations may be updated based on patient status, additional functional criteria and insurance authorization.  Follow Up Recommendations       Assistance Recommended at Discharge Intermittent Supervision/Assistance  Patient can return home with the following A little help with walking and/or transfers;A little help with bathing/dressing/bathroom;Help with stairs or ramp for entrance    Equipment Recommendations  Rolling walker (2 wheels);BSC/3in1;Other (comment)    Recommendations for Other Services       Precautions / Restrictions Precautions Precautions: Fall, Back Required Braces or Orthoses: Spinal Brace Spinal Brace: Thoracolumbosacral orthotic;Applied in sitting position Restrictions Weight Bearing Restrictions: No     Mobility  Bed Mobility Overal bed mobility: Needs Assistance Bed Mobility: Rolling, Sidelying to Sit Rolling: Min assist Sidelying to sit: HOB elevated, Min guard       General bed mobility comments: Pt demonstrated fair ability with rolling at min assist to achieve sidelying; required min guarding from sidelying to EOB; required verbal cues for hand and foot placement. Reminder on spinal precautions    Transfers Overall transfer level: Needs assistance Equipment used: Rolling walker (2 wheels) Transfers: Sit to/from Stand Sit to Stand: Min guard           General transfer comment: Verbal cues to scoot EOB and hand placement for power up    Ambulation/Gait Ambulation/Gait assistance: Min guard Gait Distance (Feet): 480 Feet Assistive device: Rolling walker (2 wheels) Gait Pattern/deviations: Step-through pattern, Decreased stride length Gait velocity: decreased Gait velocity interpretation: <1.31 ft/sec, indicative of household ambulator   General Gait Details: Pt. demonstrated proper step through pattern and proper upright posture.   Stairs Stairs: Yes Stairs assistance: Min guard Stair Management: One rail Right, Step to pattern, Sideways Number of Stairs: 2 General stair comments: Patient navigated ascending steps x2 (2 steps first trial, 3 steps second trial); verbal cue for foot placement when ascending and spinal precautions to avoid twisting   Wheelchair Mobility    Modified Rankin (Stroke Patients Only)       Balance Overall balance assessment: Needs assistance Sitting-balance support: No upper  extremity supported Sitting balance-Leahy Scale: Fair  Standing balance support: Bilateral upper extremity supported Standing balance-Leahy Scale: Poor Standing balance comment: Utilized RW for support                            Cognition Arousal/Alertness: Awake/alert Behavior During Therapy: WFL for tasks assessed/performed Overall Cognitive Status: Within Functional Limits for tasks assessed                                          Exercises      General Comments        Pertinent Vitals/Pain Pain Assessment Pain Assessment: 0-10 Pain Score: 4  Pain Location: Back; incision site Pain Descriptors / Indicators: Discomfort Pain Intervention(s): Limited activity within patient's tolerance, Monitored during session, Repositioned    Home Living                          Prior Function            PT Goals (current goals can now be found in the care plan section) Acute Rehab PT Goals Patient Stated Goal: Mobilize for home PT Goal Formulation: With patient/family Time For Goal Achievement: 04/10/23 Potential to Achieve Goals: Good Progress towards PT goals: Progressing towards goals      Frequency    Min 5X/week      PT Plan Current plan remains appropriate    Co-evaluation              AM-PAC PT "6 Clicks" Mobility   Outcome Measure  Help needed turning from your back to your side while in a flat bed without using bedrails?: A Little Help needed moving from lying on your back to sitting on the side of a flat bed without using bedrails?: A Little Help needed moving to and from a bed to a chair (including a wheelchair)?: A Little Help needed standing up from a chair using your arms (e.g., wheelchair or bedside chair)?: A Little Help needed to walk in hospital room?: A Little Help needed climbing 3-5 steps with a railing? : A Little 6 Click Score: 18    End of Session Equipment Utilized During Treatment: Back  brace Activity Tolerance: Patient tolerated treatment well Patient left: in bed;with call bell/phone within reach;with family/visitor present Nurse Communication: Mobility status PT Visit Diagnosis: Other abnormalities of gait and mobility (R26.89);Muscle weakness (generalized) (M62.81);Pain     Time: 1030-1055 PT Time Calculation (min) (ACUTE ONLY): 25 min  Charges:  $ Gait Training 23-37 mins                      Christene Lye, [place] [facility] [phone] Secure chat preferred     Christene Lye 03/28/2023, 11:18 AM

## 2023-03-28 NOTE — Progress Notes (Signed)
    Patient doing well  Patient denies leg pain Has been ambulating   Physical Exam: Vitals:   03/28/23 0010 03/28/23 0514  BP: 129/71 115/73  Pulse: (!) 112 (!) 114  Resp:    Temp: 98.9 F (37.2 C) 98.3 F (36.8 C)  SpO2: 94% 96%    Dressing in place NVI  Pt s/p L4-S1 fusion, doing well  - up with PT/OT, encourage ambulation - Percocet for pain, Robaxin for muscle spasms - d/c home today with f/u in 2 weeks

## 2023-03-28 NOTE — Plan of Care (Signed)

## 2023-04-01 ENCOUNTER — Encounter (HOSPITAL_COMMUNITY): Payer: Self-pay | Admitting: Emergency Medicine

## 2023-04-01 ENCOUNTER — Other Ambulatory Visit: Payer: Self-pay

## 2023-04-01 ENCOUNTER — Inpatient Hospital Stay (HOSPITAL_COMMUNITY)
Admission: EM | Admit: 2023-04-01 | Discharge: 2023-04-03 | DRG: 460 | Disposition: A | Discharge status: Home / Self Care | Payer: 59 | Attending: Orthopedic Surgery | Admitting: Orthopedic Surgery

## 2023-04-01 DIAGNOSIS — Z7982 Long term (current) use of aspirin: Secondary | ICD-10-CM | POA: Diagnosis not present

## 2023-04-01 DIAGNOSIS — K219 Gastro-esophageal reflux disease without esophagitis: Secondary | ICD-10-CM | POA: Diagnosis not present

## 2023-04-01 DIAGNOSIS — F17210 Nicotine dependence, cigarettes, uncomplicated: Secondary | ICD-10-CM | POA: Diagnosis not present

## 2023-04-01 DIAGNOSIS — Z9889 Other specified postprocedural states: Secondary | ICD-10-CM | POA: Diagnosis not present

## 2023-04-01 DIAGNOSIS — Z79899 Other long term (current) drug therapy: Secondary | ICD-10-CM | POA: Diagnosis not present

## 2023-04-01 DIAGNOSIS — Z8249 Family history of ischemic heart disease and other diseases of the circulatory system: Secondary | ICD-10-CM

## 2023-04-01 DIAGNOSIS — M5117 Intervertebral disc disorders with radiculopathy, lumbosacral region: Secondary | ICD-10-CM | POA: Diagnosis not present

## 2023-04-01 DIAGNOSIS — Z818 Family history of other mental and behavioral disorders: Secondary | ICD-10-CM | POA: Diagnosis not present

## 2023-04-01 DIAGNOSIS — T84428A Displacement of other internal orthopedic devices, implants and grafts, initial encounter: Secondary | ICD-10-CM | POA: Diagnosis not present

## 2023-04-01 DIAGNOSIS — Z7951 Long term (current) use of inhaled steroids: Secondary | ICD-10-CM

## 2023-04-01 DIAGNOSIS — J4489 Other specified chronic obstructive pulmonary disease: Secondary | ICD-10-CM | POA: Diagnosis not present

## 2023-04-01 DIAGNOSIS — Y831 Surgical operation with implant of artificial internal device as the cause of abnormal reaction of the patient, or of later complication, without mention of misadventure at the time of the procedure: Secondary | ICD-10-CM | POA: Diagnosis not present

## 2023-04-01 DIAGNOSIS — R32 Unspecified urinary incontinence: Secondary | ICD-10-CM | POA: Diagnosis not present

## 2023-04-01 DIAGNOSIS — Z96642 Presence of left artificial hip joint: Secondary | ICD-10-CM | POA: Diagnosis present

## 2023-04-01 DIAGNOSIS — T84226A Displacement of internal fixation device of vertebrae, initial encounter: Secondary | ICD-10-CM | POA: Diagnosis not present

## 2023-04-01 DIAGNOSIS — Z6837 Body mass index (BMI) 37.0-37.9, adult: Secondary | ICD-10-CM | POA: Diagnosis not present

## 2023-04-01 DIAGNOSIS — I10 Essential (primary) hypertension: Secondary | ICD-10-CM | POA: Diagnosis not present

## 2023-04-01 DIAGNOSIS — R7303 Prediabetes: Secondary | ICD-10-CM | POA: Diagnosis not present

## 2023-04-01 DIAGNOSIS — M5459 Other low back pain: Secondary | ICD-10-CM | POA: Diagnosis not present

## 2023-04-01 DIAGNOSIS — J449 Chronic obstructive pulmonary disease, unspecified: Secondary | ICD-10-CM | POA: Diagnosis not present

## 2023-04-01 DIAGNOSIS — Z811 Family history of alcohol abuse and dependence: Secondary | ICD-10-CM

## 2023-04-01 DIAGNOSIS — Z888 Allergy status to other drugs, medicaments and biological substances status: Secondary | ICD-10-CM | POA: Diagnosis not present

## 2023-04-01 DIAGNOSIS — Z833 Family history of diabetes mellitus: Secondary | ICD-10-CM

## 2023-04-01 DIAGNOSIS — G8929 Other chronic pain: Secondary | ICD-10-CM | POA: Diagnosis not present

## 2023-04-01 DIAGNOSIS — Z4789 Encounter for other orthopedic aftercare: Secondary | ICD-10-CM | POA: Diagnosis not present

## 2023-04-01 DIAGNOSIS — F319 Bipolar disorder, unspecified: Secondary | ICD-10-CM | POA: Diagnosis present

## 2023-04-01 DIAGNOSIS — M5416 Radiculopathy, lumbar region: Secondary | ICD-10-CM | POA: Diagnosis present

## 2023-04-01 DIAGNOSIS — N3289 Other specified disorders of bladder: Secondary | ICD-10-CM | POA: Diagnosis not present

## 2023-04-01 DIAGNOSIS — M545 Low back pain, unspecified: Principal | ICD-10-CM

## 2023-04-01 DIAGNOSIS — Z981 Arthrodesis status: Secondary | ICD-10-CM | POA: Diagnosis not present

## 2023-04-01 DIAGNOSIS — F17200 Nicotine dependence, unspecified, uncomplicated: Secondary | ICD-10-CM | POA: Diagnosis not present

## 2023-04-01 LAB — CBC WITH DIFFERENTIAL/PLATELET
Abs Immature Granulocytes: 0.13 10*3/uL — ABNORMAL HIGH (ref 0.00–0.07)
Basophils Absolute: 0 10*3/uL (ref 0.0–0.1)
Basophils Relative: 0 %
Eosinophils Absolute: 0.1 10*3/uL (ref 0.0–0.5)
Eosinophils Relative: 1 %
HCT: 27.6 % — ABNORMAL LOW (ref 36.0–46.0)
Hemoglobin: 8.9 g/dL — ABNORMAL LOW (ref 12.0–15.0)
Immature Granulocytes: 1 %
Lymphocytes Relative: 16 %
Lymphs Abs: 2 10*3/uL (ref 0.7–4.0)
MCH: 29 pg (ref 26.0–34.0)
MCHC: 32.2 g/dL (ref 30.0–36.0)
MCV: 89.9 fL (ref 80.0–100.0)
Monocytes Absolute: 0.6 10*3/uL (ref 0.1–1.0)
Monocytes Relative: 5 %
Neutro Abs: 9.9 10*3/uL — ABNORMAL HIGH (ref 1.7–7.7)
Neutrophils Relative %: 77 %
Platelets: 286 10*3/uL (ref 150–400)
RBC: 3.07 MIL/uL — ABNORMAL LOW (ref 3.87–5.11)
RDW: 15.2 % (ref 11.5–15.5)
WBC: 12.9 10*3/uL — ABNORMAL HIGH (ref 4.0–10.5)
nRBC: 0.2 % (ref 0.0–0.2)

## 2023-04-01 LAB — BASIC METABOLIC PANEL
Anion gap: 8 (ref 5–15)
BUN: 9 mg/dL (ref 6–20)
CO2: 29 mmol/L (ref 22–32)
Calcium: 8.9 mg/dL (ref 8.9–10.3)
Chloride: 99 mmol/L (ref 98–111)
Creatinine, Ser: 0.83 mg/dL (ref 0.44–1.00)
GFR, Estimated: >60 mL/min (ref >60)
Glucose, Bld: 122 mg/dL — ABNORMAL HIGH (ref 70–99)
Potassium: 4.1 mmol/L (ref 3.5–5.1)
Sodium: 136 mmol/L (ref 135–145)

## 2023-04-01 MED ORDER — ZOLPIDEM TARTRATE 5 MG PO TABS: 5.0000 mg | ORAL_TABLET | Freq: Every evening | ORAL | Status: DC | PRN

## 2023-04-01 MED ORDER — OXYCODONE-ACETAMINOPHEN 5-325 MG PO TABS
1.0000 | ORAL_TABLET | ORAL | Status: DC | PRN
  Administered 2023-04-02 – 2023-04-03 (×6): 2 via ORAL
  Filled 2023-04-01 (×6): qty 2

## 2023-04-01 MED ORDER — LORATADINE 10 MG PO TABS
10.0000 mg | ORAL_TABLET | Freq: Every day | ORAL | Status: DC
  Administered 2023-04-03: 10 mg via ORAL
  Filled 2023-04-01: qty 1

## 2023-04-01 MED ORDER — ALBUTEROL SULFATE (2.5 MG/3ML) 0.083% IN NEBU: 2.5000 mg | INHALATION_SOLUTION | Freq: Four times a day (QID) | RESPIRATORY_TRACT | Status: DC | PRN

## 2023-04-01 MED ORDER — LOSARTAN POTASSIUM 50 MG PO TABS
25.0000 mg | ORAL_TABLET | Freq: Every day | ORAL | Status: DC
  Administered 2023-04-03: 25 mg via ORAL
  Filled 2023-04-01: qty 1

## 2023-04-01 MED ORDER — ADULT MULTIVITAMIN W/MINERALS CH
1.0000 | ORAL_TABLET | Freq: Every day | ORAL | Status: DC
  Administered 2023-04-03: 1 via ORAL
  Filled 2023-04-01: qty 1

## 2023-04-01 MED ORDER — FLEET ENEMA 7-19 GM/118ML RE ENEM: 1.0000 | ENEMA | Freq: Once | RECTAL | Status: DC | PRN

## 2023-04-01 MED ORDER — SENNOSIDES-DOCUSATE SODIUM 8.6-50 MG PO TABS: 1.0000 | ORAL_TABLET | Freq: Every evening | ORAL | Status: DC | PRN

## 2023-04-01 MED ORDER — BISACODYL 5 MG PO TBEC: 5.0000 mg | DELAYED_RELEASE_TABLET | Freq: Every day | ORAL | Status: DC | PRN

## 2023-04-01 MED ORDER — MONTELUKAST SODIUM 10 MG PO TABS
10.0000 mg | ORAL_TABLET | Freq: Every day | ORAL | Status: DC
  Administered 2023-04-03: 10 mg via ORAL
  Filled 2023-04-01: qty 1

## 2023-04-01 MED ORDER — DIPHENHYDRAMINE HCL 12.5 MG/5ML PO ELIX: 12.5000 mg | ORAL_SOLUTION | ORAL | Status: DC | PRN

## 2023-04-01 MED ORDER — ARIPIPRAZOLE ER 400 MG IM PRSY: 400.0000 mg | PREFILLED_SYRINGE | INTRAMUSCULAR | Status: DC

## 2023-04-01 MED ORDER — MOMETASONE FURO-FORMOTEROL FUM 200-5 MCG/ACT IN AERO
2.0000 | INHALATION_SPRAY | Freq: Two times a day (BID) | RESPIRATORY_TRACT | Status: DC
  Administered 2023-04-02 – 2023-04-03 (×2): 2 via RESPIRATORY_TRACT
  Filled 2023-04-01: qty 8.8

## 2023-04-01 MED ORDER — DOCUSATE SODIUM 100 MG PO CAPS
100.0000 mg | ORAL_CAPSULE | Freq: Two times a day (BID) | ORAL | Status: DC
  Administered 2023-04-01 – 2023-04-03 (×3): 100 mg via ORAL
  Filled 2023-04-01 (×3): qty 1

## 2023-04-01 MED ORDER — FUROSEMIDE 40 MG PO TABS
40.0000 mg | ORAL_TABLET | Freq: Two times a day (BID) | ORAL | Status: DC
  Administered 2023-04-01 – 2023-04-03 (×3): 40 mg via ORAL
  Filled 2023-04-01 (×3): qty 1

## 2023-04-01 MED ORDER — METHOCARBAMOL 500 MG PO TABS
500.0000 mg | ORAL_TABLET | Freq: Four times a day (QID) | ORAL | Status: DC | PRN
  Administered 2023-04-02 – 2023-04-03 (×3): 500 mg via ORAL
  Filled 2023-04-01 (×3): qty 1

## 2023-04-01 MED ORDER — FLUTICASONE PROPIONATE 50 MCG/ACT NA SUSP: 2.0000 | Freq: Every day | NASAL | Status: DC | PRN

## 2023-04-01 MED ORDER — LITHIUM CARBONATE ER 300 MG PO TBCR
300.0000 mg | EXTENDED_RELEASE_TABLET | Freq: Two times a day (BID) | ORAL | Status: DC
  Administered 2023-04-01 – 2023-04-03 (×3): 300 mg via ORAL
  Filled 2023-04-01 (×4): qty 1

## 2023-04-01 MED ORDER — QUETIAPINE FUMARATE 300 MG PO TABS
300.0000 mg | ORAL_TABLET | Freq: Every day | ORAL | Status: DC
  Administered 2023-04-01 – 2023-04-02 (×2): 300 mg via ORAL
  Filled 2023-04-01: qty 1
  Filled 2023-04-01: qty 3

## 2023-04-01 MED ORDER — OXYBUTYNIN CHLORIDE ER 10 MG PO TB24
10.0000 mg | ORAL_TABLET | Freq: Every day | ORAL | Status: DC
  Administered 2023-04-01 – 2023-04-03 (×2): 10 mg via ORAL
  Filled 2023-04-01 (×3): qty 1

## 2023-04-01 MED ORDER — CYCLOBENZAPRINE HCL 10 MG PO TABS
5.0000 mg | ORAL_TABLET | Freq: Every evening | ORAL | Status: DC | PRN
  Administered 2023-04-02: 10 mg via ORAL
  Filled 2023-04-01: qty 1

## 2023-04-01 MED ORDER — METHOCARBAMOL 1000 MG/10ML IJ SOLN: 500.0000 mg | Freq: Four times a day (QID) | INTRAVENOUS | Status: DC | PRN

## 2023-04-01 MED ORDER — ALBUTEROL SULFATE HFA 108 (90 BASE) MCG/ACT IN AERS: 2.0000 | INHALATION_SPRAY | Freq: Four times a day (QID) | RESPIRATORY_TRACT | Status: DC | PRN

## 2023-04-01 MED ORDER — ONDANSETRON HCL 4 MG/2ML IJ SOLN
4.0000 mg | Freq: Four times a day (QID) | INTRAMUSCULAR | Status: DC | PRN
  Administered 2023-04-02: 4 mg via INTRAVENOUS

## 2023-04-01 MED ORDER — PAROXETINE HCL ER 12.5 MG PO TB24
25.0000 mg | ORAL_TABLET | Freq: Every day | ORAL | Status: DC
  Administered 2023-04-01 – 2023-04-03 (×2): 25 mg via ORAL
  Filled 2023-04-01 (×3): qty 2

## 2023-04-01 MED ORDER — BENZTROPINE MESYLATE 0.5 MG PO TABS
0.5000 mg | ORAL_TABLET | Freq: Two times a day (BID) | ORAL | Status: DC
  Administered 2023-04-01 – 2023-04-03 (×3): 0.5 mg via ORAL
  Filled 2023-04-01 (×4): qty 1

## 2023-04-01 MED ORDER — PANTOPRAZOLE SODIUM 40 MG PO TBEC: 80.0000 mg | DELAYED_RELEASE_TABLET | Freq: Every day | ORAL | Status: DC

## 2023-04-01 MED ORDER — POTASSIUM CHLORIDE CRYS ER 10 MEQ PO TBCR
10.0000 meq | EXTENDED_RELEASE_TABLET | Freq: Every day | ORAL | Status: DC
  Administered 2023-04-01 – 2023-04-03 (×2): 10 meq via ORAL
  Filled 2023-04-01 (×3): qty 1

## 2023-04-01 MED ORDER — OXYCODONE-ACETAMINOPHEN 5-325 MG PO TABS
1.0000 | ORAL_TABLET | Freq: Once | ORAL | Status: AC
  Administered 2023-04-01: 1 via ORAL
  Filled 2023-04-01: qty 1

## 2023-04-01 MED ORDER — ONDANSETRON HCL 4 MG PO TABS: 4.0000 mg | ORAL_TABLET | Freq: Four times a day (QID) | ORAL | Status: DC | PRN

## 2023-04-01 MED ORDER — HYDROXYZINE HCL 10 MG PO TABS
25.0000 mg | ORAL_TABLET | Freq: Every day | ORAL | Status: DC
  Administered 2023-04-01 – 2023-04-03 (×2): 25 mg via ORAL
  Filled 2023-04-01 (×2): qty 3

## 2023-04-01 NOTE — ED Provider Notes (Signed)
[place] EMERGENCY DEPARTMENT AT [facility] Provider Note   CSN: [id number] Arrival date & time: 04/01/23  4098     History  Chief Complaint  Patient presents with   Back Pain    Alicia Blake is a 57 y.o. female with a past medical history of radiculopathy status post posterior decompression of L4-L5-S1, asthma, COPD, GERD, pneumonia, hypertension, seizure, diabetes presents today for evaluation of lower back pain.  Patient had a posterior decompression on 03/27/2023 by Dr. Yevette Edwards.  Patient was called by Dr. Yevette Edwards who said that the hardware was not in the right place so she was advised to return for surgery tomorrow.  Patient continues to have lower back pain that radiates to her left leg.  She denies any urinary incontinence, bowel incontinence, saddle anesthesia, distal weakness.  She denies any fever, chest pain, shortness of breath, abdominal pain, urinary symptoms, blood in his stool or urine. Back Pain     Past Medical History:  Diagnosis Date   Asthma    Bipolar affect, depressed (HCC)    Bronchitis    Chronic back pain    Closed left ankle fracture    COPD (chronic obstructive pulmonary disease) (HCC)    DJD (degenerative joint disease)    BACK   GERD (gastroesophageal reflux disease)    Hypertension    Pneumonia    Pre-diabetes    dr entered diabetes without complications- no meds does not test sugar at home   PTSD (post-traumatic stress disorder)    Scoliosis    Seizures (HCC)    "when drinking" last yrs ago   Shortness of breath dyspnea    exersion   Substance abuse (HCC)    crack cocaine, 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, none since   Thyroid disease    UTI (lower urinary tract infection)    Past Surgical History:  Procedure Laterality Date   2 LEFT TOES SURGERY  12/2014   finished only on right side   ABDOMINAL EXPOSURE N/A 03/26/2023   Procedure: ABDOMINAL EXPOSURE;  Surgeon: Cephus Shelling, MD;  Location: [facility] OR;  Service: Vascular;   Laterality: N/A;   ANTERIOR LUMBAR FUSION N/A 03/26/2023   Procedure: REMOVAL OF POSTERIOR HARDWARE;  Surgeon: Estill Bamberg, MD;  Location: [facility] OR;  Service: Orthopedics;  Laterality: N/A;   ANTERIOR LUMBAR FUSION N/A 03/26/2023   Procedure: LUMBAR FOUR - LUMBAR FIVE, LUMBAR FIVE - SACRUM ONE ANTERIOR LUMBAR INTERBODY FUSION WITH INSTRUMENTATION AND ALLOGRAFT;  Surgeon: Estill Bamberg, MD;  Location: [facility] OR;  Service: Orthopedics;  Laterality: N/A;   BACK SURGERY     BALLOON DILATION N/A 12/14/2015   Procedure: BALLOON DILATION;  Surgeon: [name], MD;  Location: [facility];  Service: Endoscopy;  Laterality: N/A;   COLONOSCOPY WITH PROPOFOL N/A 12/14/2015   Procedure: COLONOSCOPY WITH PROPOFOL;  Surgeon: [name], MD;  Location: [facility];  Service: Endoscopy;  Laterality: N/A;   ESOPHAGOGASTRODUODENOSCOPY (EGD) WITH PROPOFOL N/A 12/14/2015   Procedure: ESOPHAGOGASTRODUODENOSCOPY (EGD) WITH PROPOFOL;  Surgeon: [name], MD;  Location: [facility];  Service: Endoscopy;  Laterality: N/A;   FRACTURE SURGERY     JOINT REPLACEMENT     left hip   LEFT ANKLE SURGERY  12/2014   LUMBAR LAMINECTOMY/DECOMPRESSION MICRODISCECTOMY N/A 06/18/2017   Procedure: LAMINECTOMY AND FORAMINOTOMY LUMBAR FOUR - LUMBAR FIVE;  Surgeon: Coletta Memos, MD;  Location: [facility] OR;  Service: Neurosurgery;  Laterality: N/A;  LAMINECTOMY AND FORAMINOTOMY LUMBAR 4- LUMBAR 5   LUMBAR WOUND DEBRIDEMENT N/A 03/10/2020  Procedure: Lumbar Wound Debridement;  Surgeon: Coletta Memos, MD;  Location: [facility] OR;  Service: Neurosurgery;  Laterality: N/A;  posterior   NECK SURGERY  AS TEENAGER   STITCHES TO NECK    ORIF ANKLE FRACTURE Left 07/24/2021   Procedure: OPEN REDUCTION INTERNAL FIXATION (ORIF) ANKLE FRACTURE;  Surgeon: Sheral Apley, MD;  Location: [facility];  Service: Orthopedics;  Laterality: Left;   SURGERY FOR CUT ON STOMACH  TEENAGER   TOTAL HIP ARTHROPLASTY Left 05/14/2016   Procedure: TOTAL HIP  ARTHROPLASTY ANTERIOR APPROACH;  Surgeon: Sheral Apley, MD;  Location: [facility] OR;  Service: Orthopedics;  Laterality: Left;     Home Medications Prior to Admission medications   Medication Sig Start Date End Date Taking? Authorizing Provider  ABILIFY MAINTENA 400 MG PRSY prefilled syringe Inject 400 mg into the muscle every 28 (twenty-eight) days. 06/13/21   [provider]  acetaminophen (TYLENOL) 325 MG tablet Take 650 mg by mouth every 6 (six) hours as needed for moderate pain.    [provider]  albuterol (PROVENTIL) (2.5 MG/3ML) 0.083% nebulizer solution Take 3 mLs (2.5 mg total) by nebulization every 6 (six) hours as needed for wheezing or shortness of breath. 12/17/21   Raspet, Noberto Retort, PA-C  albuterol (VENTOLIN HFA) 108 (90 Base) MCG/ACT inhaler Inhale 2 puffs into the lungs every 6 (six) hours as needed for wheezing. 12/17/21   Raspet, Noberto Retort, PA-C  aspirin EC 81 MG tablet Take 1 tablet (81 mg total) by mouth 2 (two) times daily. For DVT prophylaxis for 30 days after surgery. Patient taking differently: Take 81 mg by mouth daily. 07/24/21   Jenne Pane, PA-C  benztropine (COGENTIN) 0.5 MG tablet Take 0.5 mg by mouth 2 (two) times daily.    [provider]  budesonide-formoterol (SYMBICORT) 160-4.5 MCG/ACT inhaler Inhale 2 puffs into the lungs in the morning and at bedtime. 11/27/21   Crain, Whitney L, PA  cetirizine (ZYRTEC) 10 MG tablet Take 10 mg by mouth daily. 05/30/22   [provider]  cyclobenzaprine (FLEXERIL) 10 MG tablet Take 1 tablet (10 mg total) by mouth 2 (two) times daily as needed for muscle spasms. Patient not taking: Reported on 03/14/2023 12/08/22   Evlyn Kanner T, PA-C  cyclobenzaprine (FLEXERIL) 5 MG tablet Take 5-10 mg by mouth at bedtime as needed for muscle spasms. 03/07/23   [provider]  esomeprazole (NEXIUM) 40 MG capsule Take 40 mg by mouth daily. 09/22/19   [provider]  fluticasone (FLONASE) 50 MCG/ACT  nasal spray Place 2 sprays into both nostrils daily as needed for allergies.    [provider]  furosemide (LASIX) 40 MG tablet Take 40 mg by mouth 2 (two) times daily. 10/30/22   [provider]  hydrOXYzine (VISTARIL) 25 MG capsule Take 25 mg by mouth daily. 06/16/19   [provider]  lidocaine (LIDODERM) 5 % Place 1 patch onto the skin daily. Remove & Discard patch within 12 hours or as directed by MD Patient not taking: Reported on 03/14/2023 02/25/23   Linwood Dibbles, MD  lithium 300 MG tablet Take 300 mg by mouth 2 (two) times daily. 10/07/22   [provider]  losartan (COZAAR) 25 MG tablet Take 25 mg by mouth daily. 09/18/19   Norm Salt, PA  methocarbamol (ROBAXIN) 500 MG tablet Take 1 tablet (500 mg total) by mouth every 6 (six) hours as needed for muscle spasms. 03/28/23   Estill Bamberg, MD  montelukast (  SINGULAIR) 10 MG tablet Take 10 mg by mouth daily. 04/24/22   [provider]  Multiple Vitamin (MULTIVITAMIN) tablet Take 1 tablet by mouth daily.    [provider]  oxybutynin (DITROPAN-XL) 10 MG 24 hr tablet Take 10 mg by mouth daily.    [provider]  oxyCODONE-acetaminophen (PERCOCET/ROXICET) 5-325 MG tablet Take 1-2 tablets by mouth every 4 (four) hours as needed for severe pain. 03/28/23   Estill Bamberg, MD  PARoxetine (PAXIL-CR) 25 MG 24 hr tablet Take 25 mg by mouth daily. 10/07/22   [provider]  potassium chloride (KLOR-CON) 10 MEQ tablet Take 10 mEq by mouth daily. 04/24/22   [provider]  QUEtiapine (SEROQUEL) 200 MG tablet Take 300 mg by mouth at bedtime. 10/29/22   [provider]  tiZANidine (ZANAFLEX) 4 MG tablet Take 4 mg by mouth 2 (two) times daily as needed for muscle spasms.    [provider]      Allergies    Risperidone and related    Review of Systems   Review of Systems  Musculoskeletal:  Positive for back pain.    Physical Exam Updated Vital  Signs BP 129/70 (BP Location: Right Arm)   Pulse (!) 102   Temp 98.3 F (36.8 C) (Oral)   Resp 16   Ht 5\' 6"  (1.676 m)   Wt 106.1 kg   LMP 12/09/2015 Comment: very irregular  SpO2 96%   BMI 37.77 kg/m  Physical Exam Vitals and nursing note reviewed.  Constitutional:      Appearance: Normal appearance.  HENT:     Head: Normocephalic and atraumatic.     Mouth/Throat:     Mouth: Mucous membranes are moist.  Eyes:     General: No scleral icterus. Cardiovascular:     Rate and Rhythm: Normal rate and regular rhythm.     Pulses: Normal pulses.     Heart sounds: Normal heart sounds.  Pulmonary:     Effort: Pulmonary effort is normal.     Breath sounds: Normal breath sounds.  Abdominal:     General: Abdomen is flat.     Palpations: Abdomen is soft.     Tenderness: There is no abdominal tenderness.  Musculoskeletal:        General: No deformity.     Comments: Back brace in place from the surgery.  Tenderness to palpation to paraspinal muscles of the left lumbar spine.  Skin:    General: Skin is warm.     Findings: No rash.  Neurological:     General: No focal deficit present.     Mental Status: She is alert.  Psychiatric:        Mood and Affect: Mood normal.     ED Results / Procedures / Treatments   Labs (all labs ordered are listed, but only abnormal results are displayed) Labs Reviewed  CBC WITH DIFFERENTIAL/PLATELET - Abnormal; Notable for the following components:      Result Value   WBC 12.9 (*)    RBC 3.07 (*)    Hemoglobin 8.9 (*)    HCT 27.6 (*)    Neutro Abs 9.9 (*)    Abs Immature Granulocytes 0.13 (*)    All other components within normal limits  BASIC METABOLIC PANEL - Abnormal; Notable for the following components:   Glucose, Bld 122 (*)    All other components within normal limits    EKG None  Radiology No results found.  Procedures Procedures  Medications Ordered in ED Medications - No data to display  ED Course/ Medical Decision  Making/ A&P                             Medical Decision Making Amount and/or Complexity of Data Reviewed Labs: ordered.   This patient presents to the ED for back pain, this involves an extensive number of treatment options, and is a complaint that carries with a high risk of complications and morbidity.  The differential diagnosis includes musculoskeletal pain, cauda equina, infected stone, surgery complications, herniated disc, radiculopathy.  This is not an exhaustive list.  Lab tests: I ordered and personally interpreted labs.  The pertinent results include: WBC 12.9. Hbg 8.9. Platelets unremarkable. Electrolytes unremarkable. BUN, creatinine unremarkable.   Problem list/ ED course/ Critical interventions/ Medical management: HPI: See above Vital signs within normal range and stable throughout visit. Laboratory/imaging studies significant for: See above. On physical examination, patient is afebrile and appears in no acute distress.  Patient presents with a chief complaint of low back pain.  Patient had a posterior decompression of L4-5 S1 on 03/27/2023.  Patient continued to have low back pain.  Patient was advised to go to the emergency room by Dr. Yevette Edwards for surgery tomorrow.  Will place a consult call to the on-call orthopedics.  At this point patient denies any urinary retention, bowel incontinence, saddle anesthesia or distal weakness.  Low suspicion for cauda equina.  Unlikely kidney stones, patient has no urinary symptoms. I have reviewed the patient home medicines and have made adjustments as needed.  Cardiac monitoring/EKG: The patient was maintained on a cardiac monitor.  I personally reviewed and interpreted the cardiac monitor which showed an underlying rhythm of: sinus rhythm.  Additional history obtained: External records from outside source obtained and reviewed including: Chart review including previous notes, labs, imaging.  Consultations obtained: I requested  consultation with Dr. Ave Filter who spoke to Dr. Yevette Edwards, and discussed lab and imaging findings as well as pertinent plan.  PA Kayla who is working with Dr. Yevette Edwards will come down to the ER to see the patient..  Disposition Patient is admitted to Dr. Marshell Levan service for surgery tomorrow. This chart was dictated using voice recognition software.  Despite best efforts to proofread,  errors can occur which can change the documentation meaning.          Final Clinical Impression(s) / ED Diagnoses Final diagnoses:  Acute low back pain, unspecified back pain laterality, unspecified whether sciatica present    Rx / DC Orders ED Discharge Orders     None         Jeanelle Malling, [place] 04/01/23 1906    Gloris Manchester, MD 04/02/23 [phone]

## 2023-04-01 NOTE — Consult Note (Signed)
Reason for Consult: Increased LBP Referring Physician: Taylir Blake is an 57 y.o. female.  HPI: Alicia Blake is S/P Lumbar ANT/POST fusion last week. She has been doing well with minimal well controlled PO pain. She Presented to PO appt in the office and was found to have progression of anterior migration of her L5-S1 intervertebral spacer compared to PO CT scan. Vascular surgery stated they could likely safely perform repeat abdominal exposure for 1 week PO and patient therefore was sent to ED for admission in anticipation of revision procedure to revise L5-S1 spacer. She has had slight increase in back pain and has presented to ED tonight. Dr Alicia Blake plans surgery 8:30am tomorrow morning. She denies any radicular symptoms at this time. Denies any abdominal pain. NL B/B function. She is hungry and desiring her routine medications   Past Medical History:  Diagnosis Date   Asthma    Bipolar affect, depressed (HCC)    Bronchitis    Chronic back pain    Closed left ankle fracture    COPD (chronic obstructive pulmonary disease) (HCC)    DJD (degenerative joint disease)    BACK   GERD (gastroesophageal reflux disease)    Hypertension    Pneumonia    Pre-diabetes    dr entered diabetes without complications- no meds does not test sugar at home   PTSD (post-traumatic stress disorder)    Scoliosis    Seizures (HCC)    "when drinking" last yrs ago   Shortness of breath dyspnea    exersion   Substance abuse (HCC)    crack cocaine, 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, none since   Thyroid disease    UTI (lower urinary tract infection)     Past Surgical History:  Procedure Laterality Date   2 LEFT TOES SURGERY  12/2014   finished only on right side   ABDOMINAL EXPOSURE N/A 03/26/2023   Procedure: ABDOMINAL EXPOSURE;  Surgeon: Cephus Shelling, MD;  Location: [facility] OR;  Service: Vascular;  Laterality: N/A;   ANTERIOR LUMBAR FUSION N/A 03/26/2023   Procedure: REMOVAL OF POSTERIOR HARDWARE;   Surgeon: Estill Bamberg, MD;  Location: [facility] OR;  Service: Orthopedics;  Laterality: N/A;   ANTERIOR LUMBAR FUSION N/A 03/26/2023   Procedure: LUMBAR FOUR - LUMBAR FIVE, LUMBAR FIVE - SACRUM ONE ANTERIOR LUMBAR INTERBODY FUSION WITH INSTRUMENTATION AND ALLOGRAFT;  Surgeon: Estill Bamberg, MD;  Location: [facility] OR;  Service: Orthopedics;  Laterality: N/A;   BACK SURGERY     BALLOON DILATION N/A 12/14/2015   Procedure: BALLOON DILATION;  Surgeon: [name], MD;  Location: [facility];  Service: Endoscopy;  Laterality: N/A;   COLONOSCOPY WITH PROPOFOL N/A 12/14/2015   Procedure: COLONOSCOPY WITH PROPOFOL;  Surgeon: [name], MD;  Location: [facility];  Service: Endoscopy;  Laterality: N/A;   ESOPHAGOGASTRODUODENOSCOPY (EGD) WITH PROPOFOL N/A 12/14/2015   Procedure: ESOPHAGOGASTRODUODENOSCOPY (EGD) WITH PROPOFOL;  Surgeon: [name], MD;  Location: [facility];  Service: Endoscopy;  Laterality: N/A;   FRACTURE SURGERY     JOINT REPLACEMENT     left hip   LEFT ANKLE SURGERY  12/2014   LUMBAR LAMINECTOMY/DECOMPRESSION MICRODISCECTOMY N/A 06/18/2017   Procedure: LAMINECTOMY AND FORAMINOTOMY LUMBAR FOUR - LUMBAR FIVE;  Surgeon: Coletta Memos, MD;  Location: [facility];  Service: Neurosurgery;  Laterality: N/A;  LAMINECTOMY AND FORAMINOTOMY LUMBAR 4- LUMBAR 5   LUMBAR WOUND DEBRIDEMENT N/A 03/10/2020   Procedure: Lumbar Wound Debridement;  Surgeon: Coletta Memos, MD;  Location: [facility] OR;  Service: Neurosurgery;  Laterality: N/A;  posterior   NECK SURGERY  AS TEENAGER   STITCHES TO NECK    ORIF ANKLE FRACTURE Left 07/24/2021   Procedure: OPEN REDUCTION INTERNAL FIXATION (ORIF) ANKLE FRACTURE;  Surgeon: Sheral Apley, MD;  Location: [facility];  Service: Orthopedics;  Laterality: Left;   SURGERY FOR CUT ON STOMACH  TEENAGER   TOTAL HIP ARTHROPLASTY Left 05/14/2016   Procedure: TOTAL HIP ARTHROPLASTY ANTERIOR APPROACH;  Surgeon: Sheral Apley, MD;  Location: [facility] OR;  Service: Orthopedics;   Laterality: Left;    Family History  Problem Relation Age of Onset   Hypertension Mother    Hypertension Maternal Grandmother    Hypertension Paternal Grandmother    Diabetes Paternal Grandmother    Hypertension Paternal Grandfather    Diabetes Paternal Grandfather    Alcohol abuse Paternal Aunt    Mental illness Cousin    Heart attack Cousin    Heart attack Maternal Uncle     Social History:  reports that she has been smoking cigarettes. She has a 13.00 pack-year smoking history. She has never used smokeless tobacco. She reports that she does not drink alcohol and does not use drugs.  Allergies:  Allergies  Allergen Reactions   Risperidone And Related Swelling and Other (See Comments)    Facial swelling    Medications: Continuous:  methocarbamol (ROBAXIN) IV      Results for orders placed or performed during the hospital encounter of 04/01/23 (from the past 48 hour(s))  CBC with Differential     Status: Abnormal   Collection Time: 04/01/23  8:25 AM  Result Value Ref Range   WBC 12.9 (H) 4.0 - 10.5 K/uL   RBC 3.07 (L) 3.87 - 5.11 MIL/uL   Hemoglobin 8.9 (L) 12.0 - 15.0 g/dL   HCT 57.8 (L) 46.9 - 62.9 %   MCV 89.9 80.0 - 100.0 fL   MCH 29.0 26.0 - 34.0 pg   MCHC 32.2 30.0 - 36.0 g/dL   RDW 52.8 41.3 - 24.4 %   Platelets 286 150 - 400 K/uL   nRBC 0.2 0.0 - 0.2 %   Neutrophils Relative % 77 %   Neutro Abs 9.9 (H) 1.7 - 7.7 K/uL   Lymphocytes Relative 16 %   Lymphs Abs 2.0 0.7 - 4.0 K/uL   Monocytes Relative 5 %   Monocytes Absolute 0.6 0.1 - 1.0 K/uL   Eosinophils Relative 1 %   Eosinophils Absolute 0.1 0.0 - 0.5 K/uL   Basophils Relative 0 %   Basophils Absolute 0.0 0.0 - 0.1 K/uL   Immature Granulocytes 1 %   Abs Immature Granulocytes 0.13 (H) 0.00 - 0.07 K/uL    Comment: Performed at [facility], [address], [place], [place] [address]  Basic metabolic panel     Status: Abnormal   Collection Time: 04/01/23  8:25 AM  Result Value Ref Range   Sodium  136 135 - 145 mmol/L   Potassium 4.1 3.5 - 5.1 mmol/L   Chloride 99 98 - 111 mmol/L   CO2 29 22 - 32 mmol/L   Glucose, Bld 122 (H) 70 - 99 mg/dL    Comment: Glucose reference range applies only to samples taken after fasting for at least 8 hours.   BUN 9 6 - 20 mg/dL   Creatinine, Ser 2.53 0.44 - 1.00 mg/dL   Calcium 8.9 8.9 - 66.4 mg/dL   GFR, Estimated >40 >34 mL/min    Comment: (NOTE) Calculated using the CKD-EPI Creatinine Equation (  2021)    Anion gap 8 5 - 15    Comment: Performed at [facility], [address], [place], [place] [address]    No results found.  Review of Systems  Constitutional:  Negative for chills and diaphoresis.  Respiratory:  Negative for apnea, cough and wheezing.   Cardiovascular:  Negative for leg swelling.  Gastrointestinal:  Negative for abdominal distention, abdominal pain, constipation, diarrhea, nausea and vomiting.  Musculoskeletal:  Positive for back pain.  Neurological: Negative.    Blood pressure 126/62, pulse 96, temperature 99.1 F (37.3 C), temperature source Oral, resp. rate 16, height 5\' 6"  (1.676 m), weight 106.1 kg, last menstrual period 12/09/2015, SpO2 100 %. Physical Exam Vitals reviewed.  Constitutional:      General: She is not in acute distress.    Appearance: Normal appearance. She is obese. She is not ill-appearing or diaphoretic.  HENT:     Head: Normocephalic and atraumatic.     Nose: Nose normal.     Mouth/Throat:     Pharynx: No oropharyngeal exudate.  Eyes:     Extraocular Movements: Extraocular movements intact.  Pulmonary:     Effort: Pulmonary effort is normal.  Abdominal:     General: There is no distension.     Palpations: Abdomen is soft.     Tenderness: There is no abdominal tenderness. There is no guarding.  Neurological:     Mental Status: She is alert.   TLSO brace at bedside pt resting comfortably in hallway bed.   Assessment/Plan:      1 Week PO ANT/POST L4-S1 fusion with anterior  migration of L5-S1 intervertebral spacer requiring revision and replacement.   -Vascular sx onboard for repeat exposure 8:30am  -Dr Alicia Blake taking to sx 8:30am  -NPO midnight, diet regular until then  -Cont Oxycodone/robaxin for pain control  -TLSO brace when OOB  Alicia Blake 04/01/2023, 6:09 PM

## 2023-04-01 NOTE — ED Triage Notes (Signed)
Pt reports had spinal fusion on 5/30, per patient was told by her surgeon to come to the ER for admission for another surgery tomorrow. Pt reports she was told surgical hardware is in the wrong place and needs to be moved. Patient rating pain 9/10.

## 2023-04-01 NOTE — Anesthesia Preprocedure Evaluation (Signed)
Anesthesia Evaluation  Patient identified by MRN, date of birth, ID band Patient awake    Reviewed: Allergy & Precautions, NPO status , Patient's Chart, lab work & pertinent test results  History of Anesthesia Complications Negative for: history of anesthetic complications  Airway Mallampati: II  TM Distance: >3 FB Neck ROM: Full    Dental  (+) Dental Advisory Given   Pulmonary asthma , COPD,  COPD inhaler, Current Smoker and Patient abstained from smoking.   Pulmonary exam normal        Cardiovascular hypertension, Pt. on medications Normal cardiovascular exam     Neuro/Psych Seizures -, Well Controlled,  PSYCHIATRIC DISORDERS Anxiety Depression Bipolar Disorder    Schizoaffective d/o  B/l hearing loss     GI/Hepatic ,GERD  Medicated and Controlled,,(+)     substance abuse  alcohol use, cocaine use and IV drug use  Endo/Other    Morbid obesity  Renal/GU negative Renal ROS     Musculoskeletal  (+) Arthritis ,  narcotic dependent Scoliosis    Abdominal  (+) + obese  Peds  Hematology negative hematology ROS (+)   Anesthesia Other Findings   Reproductive/Obstetrics                             Anesthesia Physical Anesthesia Plan  ASA: 3  Anesthesia Plan: General   Post-op Pain Management: Tylenol PO (pre-op)* and Dilaudid IV   Induction: Intravenous  PONV Risk Score and Plan: 2 and Treatment may vary due to age or medical condition, Ondansetron, Midazolam and Diphenhydramine  Airway Management Planned: Oral ETT  Additional Equipment: ClearSight  Intra-op Plan:   Post-operative Plan: Extubation in OR  Informed Consent: I have reviewed the patients History and Physical, chart, labs and discussed the procedure including the risks, benefits and alternatives for the proposed anesthesia with the patient or authorized representative who has indicated his/her understanding and  acceptance.     Dental advisory given  Plan Discussed with: CRNA and Anesthesiologist  Anesthesia Plan Comments:        Anesthesia Quick Evaluation

## 2023-04-01 NOTE — ED Notes (Signed)
ED TO INPATIENT HANDOFF REPORT  ED Nurse Name and Phone #: Edon Hoadley   [phone]  S Name/Age/Gender Alicia Blake 57 y.o. female Room/Bed: H013C/H013C  Code Status   Code Status: Full Code  Home/SNF/Other Home Patient oriented to: self, place, time, and situation Is this baseline? Yes   Triage Complete: Triage complete  Chief Complaint Radiculopathy, lumbar region [M54.16]  Triage Note Pt reports had spinal fusion on 5/30, per patient was told by her surgeon to come to the ER for admission for another surgery tomorrow. Pt reports she was told surgical hardware is in the wrong place and needs to be moved. Patient rating pain 9/10.    Allergies Allergies  Allergen Reactions   Risperidone And Related Swelling and Other (See Comments)    Facial swelling    Level of Care/Admitting Diagnosis ED Disposition     ED Disposition  Admit   Condition  --   Comment  Hospital Area: [facility] [[id number]]  Level of Care: Med-Surg [16]  May admit patient to [facility] or [facility] if equivalent level of care is available:: No  Covid Evaluation: Asymptomatic - no recent exposure (last 10 days) testing not required  Diagnosis: Radiculopathy, lumbar region [161096]  Admitting Physician: Estill Bamberg [[id number]]  Attending Physician: Estill Bamberg [[id number]]  Bed request comments: 5N  Certification:: I certify this patient is being admitted for an inpatient-only procedure  Estimated Length of Stay: 2          B Medical/Surgery History Past Medical History:  Diagnosis Date   Asthma    Bipolar affect, depressed (HCC)    Bronchitis    Chronic back pain    Closed left ankle fracture    COPD (chronic obstructive pulmonary disease) (HCC)    DJD (degenerative joint disease)    BACK   GERD (gastroesophageal reflux disease)    Hypertension    Pneumonia    Pre-diabetes    dr entered diabetes without complications- no meds does not test sugar at home   PTSD  (post-traumatic stress disorder)    Scoliosis    Seizures (HCC)    "when drinking" last yrs ago   Shortness of breath dyspnea    exersion   Substance abuse (HCC)    crack cocaine, 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, none since   Thyroid disease    UTI (lower urinary tract infection)    Past Surgical History:  Procedure Laterality Date   2 LEFT TOES SURGERY  12/2014   finished only on right side   ABDOMINAL EXPOSURE N/A 03/26/2023   Procedure: ABDOMINAL EXPOSURE;  Surgeon: Cephus Shelling, MD;  Location: [facility] OR;  Service: Vascular;  Laterality: N/A;   ANTERIOR LUMBAR FUSION N/A 03/26/2023   Procedure: REMOVAL OF POSTERIOR HARDWARE;  Surgeon: Estill Bamberg, MD;  Location[facility] OR;  Service: Orthopedics;  Laterality: N/A;   ANTERIOR LUMBAR FUSION N/A 03/26/2023   Procedure: LUMBAR FOUR - LUMBAR FIVE, LUMBAR FIVE - SACRUM ONE ANTERIOR LUMBAR INTERBODY FUSION WITH INSTRUMENTATION AND ALLOGRAFT;  Surgeon: Estill Bamberg, MD;  Location: [facility] OR;  Service: Orthopedics;  Laterality: N/A;   BACK SURGERY     BALLOON DILATION N/A 12/14/2015   Procedure: BALLOON DILATION;  Surgeon: Alicia Cookey, MD;  [facility]: [facility];  Service: Endoscopy;  Laterality: N/A;   COLONOSCOPY WITH PROPOFOL N/A 12/14/2015   Procedure: COLONOSCOPY WITH PROPOFOL;  Surgeon: Alicia Cookey, MD;  Location: [facility];  Service: Endoscopy;  Laterality: N/A;   ESOPHAGOGASTRODUODENOSCOPY (EGD) WITH PROPOFOL N/A  12/14/2015   Procedure: ESOPHAGOGASTRODUODENOSCOPY (EGD) WITH PROPOFOL;  Surgeon: Alicia Cookey, MD;  Location: [facility];  Service: Endoscopy;  Laterality: N/A;   FRACTURE SURGERY     JOINT REPLACEMENT     left hip   LEFT ANKLE SURGERY  12/2014   LUMBAR LAMINECTOMY/DECOMPRESSION MICRODISCECTOMY N/A 06/18/2017   Procedure: LAMINECTOMY AND FORAMINOTOMY LUMBAR FOUR - LUMBAR FIVE;  Surgeon: Coletta Memos, MD;  Location: [facility] OR;  Service: Neurosurgery;  Laterality: N/A;  LAMINECTOMY AND FORAMINOTOMY LUMBAR 4- LUMBAR 5   LUMBAR  WOUND DEBRIDEMENT N/A 03/10/2020   Procedure: Lumbar Wound Debridement;  Surgeon: Coletta Memos, MD;  Location: [facility] OR;  Service: Neurosurgery;  Laterality: N/A;  posterior   NECK SURGERY  AS TEENAGER   STITCHES TO NECK    ORIF ANKLE FRACTURE Left 07/24/2021   Procedure: OPEN REDUCTION INTERNAL FIXATION (ORIF) ANKLE FRACTURE;  Surgeon: Sheral Apley, MD;  Location:  [facility]ERY [facility];  Service: Orthopedics;  Laterality: Left;   SURGERY FOR CUT ON STOMACH  TEENAGER   TOTAL HIP ARTHROPLASTY Left 05/14/2016   Procedure: TOTAL HIP ARTHROPLASTY ANTERIOR APPROACH;  Surgeon: Sheral Apley, MD;  Location: [facility] OR;  Service: Orthopedics;  Laterality: Left;     A IV Location/Drains/Wounds Patient Lines/Drains/Airways Status     Active Line/Drains/Airways     None            Intake/Output Last 24 hours No intake or output data in the 24 hours ending 04/01/23 1829  Labs/Imaging Results for orders placed or performed during the hospital encounter of 04/01/23 (from the past 48 hour(s))  CBC with Differential     Status: Abnormal   Collection Time: 04/01/23  8:25 AM  Result Value Ref Range   WBC 12.9 (H) 4.0 - 10.5 K/uL   RBC 3.07 (L) 3.87 - 5.11 MIL/uL   Hemoglobin 8.9 (L) 12.0 - 15.0 g/dL   HCT 16.1 (L) 09.6 - 04.5 %   MCV 89.9 80.0 - 100.0 fL   MCH 29.0 26.0 - 34.0 pg   MCHC 32.2 30.0 - 36.0 g/dL   RDW 40.9 81.1 - 91.4 %   Platelets 286 150 - 400 K/uL   nRBC 0.2 0.0 - 0.2 %   Neutrophils Relative % 77 %   Neutro Abs 9.9 (H) 1.7 - 7.7 K/uL   Lymphocytes Relative 16 %   Lymphs Abs 2.0 0.7 - 4.0 K/uL   Monocytes Relative 5 %   Monocytes Absolute 0.6 0.1 - 1.0 K/uL   Eosinophils Relative 1 %   Eosinophils Absolute 0.1 0.0 - 0.5 K/uL   Basophils Relative 0 %   Basophils Absolute 0.0 0.0 - 0.1 K/uL   Immature Granulocytes 1 %   Abs Immature Granulocytes 0.13 (H) 0.00 - 0.07 K/uL    Comment: Performed at [facility], [address], [place], [place] [address]   Basic metabolic panel     Status: Abnormal   Collection Time: 04/01/23  8:25 AM  Result Value Ref Range   Sodium 136 135 - 145 mmol/L   Potassium 4.1 3.5 - 5.1 mmol/L   Chloride 99 98 - 111 mmol/L   CO2 29 22 - 32 mmol/L   Glucose, Bld 122 (H) 70 - 99 mg/dL    Comment: Glucose reference range applies only to samples taken after fasting for at least 8 hours.   BUN 9 6 - 20 mg/dL   Creatinine, Ser 6.21 0.44 - 1.00 mg/dL   Calcium 8.9 8.9 - 10.3  mg/dL   GFR, Estimated >40 >98 mL/min    Comment: (NOTE) Calculated using the CKD-EPI Creatinine Equation (2021)    Anion gap 8 5 - 15    Comment: Performed at [facility], [address] [place], [place] [address]   No results found.  Pending Labs Unresulted Labs (From admission, onward)    None       Vitals/Pain Today's Vitals   04/01/23 0809 04/01/23 0816 04/01/23 1808  BP: 129/70  126/62  Pulse: (!) 102  96  Resp: 16  16  Temp: 98.3 F (36.8 C)  99.1 F (37.3 C)  TempSrc: Oral  Oral  SpO2: 96%  100%  Weight:  106.1 kg   Height:  5\' 6"  (1.676 m)   PainSc:  9      Isolation Precautions No active isolations  Medications Medications  methocarbamol (ROBAXIN) tablet 500-1,000 mg (has no administration in time range)    Or  methocarbamol (ROBAXIN) 500 mg in dextrose 5 % 50 mL IVPB (has no administration in time range)  zolpidem (AMBIEN) tablet 5 mg (has no administration in time range)  diphenhydrAMINE (BENADRYL) 12.5 MG/5ML elixir 12.5-25 mg (has no administration in time range)  docusate sodium (COLACE) capsule 100 mg (has no administration in time range)  senna-docusate (Senokot-S) tablet 1 tablet (has no administration in time range)  bisacodyl (DULCOLAX) EC tablet 5 mg (has no administration in time range)  sodium phosphate (FLEET) 7-19 GM/118ML enema 1 enema (has no administration in time range)  ondansetron (ZOFRAN) tablet 4 mg (has no administration in time range)    Or  ondansetron (ZOFRAN) injection 4  mg (has no administration in time range)  oxyCODONE-acetaminophen (PERCOCET/ROXICET) 5-325 MG per tablet 1-2 tablet (has no administration in time range)  furosemide (LASIX) tablet 40 mg (has no administration in time range)  losartan (COZAAR) tablet 25 mg (has no administration in time range)  ARIPiprazole ER (ABILIFY MAINTENA) 400 MG prefilled syringe 400 mg (has no administration in time range)  hydrOXYzine (VISTARIL) capsule 25 mg (has no administration in time range)  lithium tablet 300 mg (has no administration in time range)  PARoxetine (PAXIL-CR) 24 hr tablet 25 mg (has no administration in time range)  QUEtiapine (SEROQUEL) tablet 300 mg (has no administration in time range)  pantoprazole (PROTONIX) EC tablet 80 mg (has no administration in time range)  oxybutynin (DITROPAN-XL) 24 hr tablet 10 mg (has no administration in time range)  benztropine (COGENTIN) tablet 0.5 mg (has no administration in time range)  cyclobenzaprine (FLEXERIL) tablet 5-10 mg (has no administration in time range)  multivitamin tablet 1 tablet (has no administration in time range)  potassium chloride (KLOR-CON) CR tablet 10 mEq (has no administration in time range)  albuterol (PROVENTIL) (2.5 MG/3ML) 0.083% nebulizer solution 2.5 mg (has no administration in time range)  albuterol (VENTOLIN HFA) 108 (90 Base) MCG/ACT inhaler 2 puff (has no administration in time range)  mometasone-formoterol (DULERA) 200-5 MCG/ACT inhaler 2 puff (has no administration in time range)  loratadine (CLARITIN) tablet 10 mg (has no administration in time range)  fluticasone (FLONASE) 50 MCG/ACT nasal spray 2 spray (has no administration in time range)  montelukast (SINGULAIR) tablet 10 mg (has no administration in time range)  oxyCODONE-acetaminophen (PERCOCET/ROXICET) 5-325 MG per tablet 1 tablet (1 tablet Oral Given 04/01/23 1755)    Mobility walks     Focused Assessments Neuro Assessment Handoff:  Swallow screen pass? Yes  Neuro Assessment:   Neuro Checks:      Has TPA been given? No If patient is a Neuro Trauma and patient is going to OR before floor call report to 4N Charge nurse: [phone] or [phone]   R Recommendations: See Admitting Provider Note  Report given to:   Additional Notes:  Patient just had spinal surgery on 5/30-was told by neuro surgery to come back to ED for further surgery due to hardware problem, complains oof back pain and arrived in brace. Admission orders have just been written

## 2023-04-02 ENCOUNTER — Other Ambulatory Visit: Payer: Self-pay

## 2023-04-02 ENCOUNTER — Encounter (HOSPITAL_COMMUNITY): Payer: Self-pay | Admitting: Orthopedic Surgery

## 2023-04-02 ENCOUNTER — Inpatient Hospital Stay (HOSPITAL_COMMUNITY)
Admission: EM | Disposition: A | Discharge status: Home / Self Care | Payer: Self-pay | Source: Home / Self Care | Attending: Orthopedic Surgery

## 2023-04-02 ENCOUNTER — Inpatient Hospital Stay (HOSPITAL_COMMUNITY): Payer: 59

## 2023-04-02 ENCOUNTER — Inpatient Hospital Stay (HOSPITAL_COMMUNITY): Payer: 59 | Admitting: Anesthesiology

## 2023-04-02 DIAGNOSIS — T84226A Displacement of internal fixation device of vertebrae, initial encounter: Secondary | ICD-10-CM

## 2023-04-02 DIAGNOSIS — I10 Essential (primary) hypertension: Secondary | ICD-10-CM | POA: Diagnosis not present

## 2023-04-02 DIAGNOSIS — J449 Chronic obstructive pulmonary disease, unspecified: Secondary | ICD-10-CM

## 2023-04-02 DIAGNOSIS — M5416 Radiculopathy, lumbar region: Secondary | ICD-10-CM

## 2023-04-02 DIAGNOSIS — F17200 Nicotine dependence, unspecified, uncomplicated: Secondary | ICD-10-CM | POA: Diagnosis not present

## 2023-04-02 DIAGNOSIS — F17210 Nicotine dependence, cigarettes, uncomplicated: Secondary | ICD-10-CM

## 2023-04-02 HISTORY — PX: ANTERIOR LUMBAR FUSION: SHX1170

## 2023-04-02 HISTORY — PX: ABDOMINAL EXPOSURE: SHX5708

## 2023-04-02 LAB — SURGICAL PCR SCREEN
MRSA, PCR: NEGATIVE
Staphylococcus aureus: NEGATIVE

## 2023-04-02 SURGERY — ANTERIOR LUMBAR FUSION 1 LEVEL
Anesthesia: General

## 2023-04-02 MED ORDER — PROMETHAZINE HCL 25 MG/ML IJ SOLN: 6.2500 mg | INTRAMUSCULAR | Status: DC | PRN

## 2023-04-02 MED ORDER — FENTANYL CITRATE (PF) 250 MCG/5ML IJ SOLN
INTRAMUSCULAR | Status: AC
  Filled 2023-04-02: qty 5

## 2023-04-02 MED ORDER — BACITRACIN ZINC 500 UNIT/GM EX OINT
TOPICAL_OINTMENT | CUTANEOUS | Status: AC
  Filled 2023-04-02: qty 28.35

## 2023-04-02 MED ORDER — LIDOCAINE 2% (20 MG/ML) 5 ML SYRINGE
INTRAMUSCULAR | Status: DC | PRN
  Administered 2023-04-02: 60 mg via INTRAVENOUS

## 2023-04-02 MED ORDER — THROMBIN 20000 UNITS EX SOLR
CUTANEOUS | Status: DC | PRN
  Administered 2023-04-02: 20 mL via TOPICAL

## 2023-04-02 MED ORDER — PHENYLEPHRINE 80 MCG/ML (10ML) SYRINGE FOR IV PUSH (FOR BLOOD PRESSURE SUPPORT)
PREFILLED_SYRINGE | INTRAVENOUS | Status: DC | PRN
  Administered 2023-04-02: 160 ug via INTRAVENOUS

## 2023-04-02 MED ORDER — SUGAMMADEX SODIUM 200 MG/2ML IV SOLN
INTRAVENOUS | Status: DC | PRN
  Administered 2023-04-02: 200 mg via INTRAVENOUS

## 2023-04-02 MED ORDER — ONDANSETRON HCL 4 MG/2ML IJ SOLN
INTRAMUSCULAR | Status: AC
  Filled 2023-04-02: qty 2

## 2023-04-02 MED ORDER — LACTATED RINGERS IV SOLN: INTRAVENOUS | Status: DC | PRN

## 2023-04-02 MED ORDER — LIDOCAINE 2% (20 MG/ML) 5 ML SYRINGE
INTRAMUSCULAR | Status: AC
  Filled 2023-04-02: qty 5

## 2023-04-02 MED ORDER — SODIUM CHLORIDE 0.9 % IR SOLN
Status: DC | PRN
  Administered 2023-04-02: 1000 mL

## 2023-04-02 MED ORDER — ROCURONIUM BROMIDE 10 MG/ML (PF) SYRINGE
PREFILLED_SYRINGE | INTRAVENOUS | Status: DC | PRN
  Administered 2023-04-02: 60 mg via INTRAVENOUS

## 2023-04-02 MED ORDER — OXYCODONE HCL 5 MG PO TABS: 5.0000 mg | ORAL_TABLET | Freq: Once | ORAL | Status: AC | PRN

## 2023-04-02 MED ORDER — CEFAZOLIN SODIUM-DEXTROSE 2-4 GM/100ML-% IV SOLN
INTRAVENOUS | Status: AC
  Filled 2023-04-02: qty 100

## 2023-04-02 MED ORDER — FENTANYL CITRATE (PF) 100 MCG/2ML IJ SOLN
INTRAMUSCULAR | Status: AC
  Filled 2023-04-02: qty 2

## 2023-04-02 MED ORDER — THROMBIN (RECOMBINANT) 20000 UNITS EX SOLR
CUTANEOUS | Status: AC
  Filled 2023-04-02: qty 20000

## 2023-04-02 MED ORDER — FENTANYL CITRATE (PF) 100 MCG/2ML IJ SOLN
25.0000 ug | INTRAMUSCULAR | Status: DC | PRN
  Administered 2023-04-02 (×3): 50 ug via INTRAVENOUS

## 2023-04-02 MED ORDER — VANCOMYCIN HCL 1000 MG IV SOLR
INTRAVENOUS | Status: DC | PRN
  Administered 2023-04-02: 1000 mg via INTRAVENOUS

## 2023-04-02 MED ORDER — DEXAMETHASONE SODIUM PHOSPHATE 10 MG/ML IJ SOLN
INTRAMUSCULAR | Status: AC
  Filled 2023-04-02: qty 1

## 2023-04-02 MED ORDER — VANCOMYCIN HCL 1 G IV SOLR: 1000.0000 mg | INTRAVENOUS | Status: DC

## 2023-04-02 MED ORDER — PROPOFOL 10 MG/ML IV BOLUS
INTRAVENOUS | Status: DC | PRN
  Administered 2023-04-02: 150 mg via INTRAVENOUS

## 2023-04-02 MED ORDER — ROCURONIUM BROMIDE 10 MG/ML (PF) SYRINGE
PREFILLED_SYRINGE | INTRAVENOUS | Status: AC
  Filled 2023-04-02: qty 10

## 2023-04-02 MED ORDER — SUCCINYLCHOLINE CHLORIDE 200 MG/10ML IV SOSY
PREFILLED_SYRINGE | INTRAVENOUS | Status: DC | PRN
  Administered 2023-04-02: 120 mg via INTRAVENOUS

## 2023-04-02 MED ORDER — MIDAZOLAM HCL 2 MG/2ML IJ SOLN
INTRAMUSCULAR | Status: AC
  Filled 2023-04-02: qty 2

## 2023-04-02 MED ORDER — BUPIVACAINE-EPINEPHRINE (PF) 0.25% -1:200000 IJ SOLN
INTRAMUSCULAR | Status: AC
  Filled 2023-04-02: qty 30

## 2023-04-02 MED ORDER — SUCCINYLCHOLINE CHLORIDE 200 MG/10ML IV SOSY
PREFILLED_SYRINGE | INTRAVENOUS | Status: AC
  Filled 2023-04-02: qty 10

## 2023-04-02 MED ORDER — DEXAMETHASONE SODIUM PHOSPHATE 10 MG/ML IJ SOLN
INTRAMUSCULAR | Status: DC | PRN
  Administered 2023-04-02: 10 mg via INTRAVENOUS

## 2023-04-02 MED ORDER — BUPIVACAINE LIPOSOME 1.3 % IJ SUSP
INTRAMUSCULAR | Status: AC
  Filled 2023-04-02: qty 20

## 2023-04-02 MED ORDER — PHENYLEPHRINE HCL-NACL 20-0.9 MG/250ML-% IV SOLN
INTRAVENOUS | Status: DC | PRN
  Administered 2023-04-02: 40 ug/min via INTRAVENOUS

## 2023-04-02 MED ORDER — OXYCODONE HCL 5 MG/5ML PO SOLN
ORAL | Status: AC
  Filled 2023-04-02: qty 5

## 2023-04-02 MED ORDER — FENTANYL CITRATE (PF) 250 MCG/5ML IJ SOLN
INTRAMUSCULAR | Status: DC | PRN
  Administered 2023-04-02: 50 ug via INTRAVENOUS
  Administered 2023-04-02 (×2): 25 ug via INTRAVENOUS
  Administered 2023-04-02: 50 ug via INTRAVENOUS

## 2023-04-02 MED ORDER — METHOCARBAMOL 500 MG PO TABS: 500.0000 mg | ORAL_TABLET | Freq: Four times a day (QID) | ORAL | 2 refills | Status: DC | PRN

## 2023-04-02 MED ORDER — 0.9 % SODIUM CHLORIDE (POUR BTL) OPTIME
TOPICAL | Status: DC | PRN
  Administered 2023-04-02: 1000 mL

## 2023-04-02 MED ORDER — OXYCODONE HCL 5 MG/5ML PO SOLN
5.0000 mg | Freq: Once | ORAL | Status: AC | PRN
  Administered 2023-04-02: 5 mg via ORAL

## 2023-04-02 MED ORDER — PROPOFOL 10 MG/ML IV BOLUS
INTRAVENOUS | Status: AC
  Filled 2023-04-02: qty 20

## 2023-04-02 MED ORDER — MIDAZOLAM HCL 2 MG/2ML IJ SOLN
INTRAMUSCULAR | Status: DC | PRN
  Administered 2023-04-02: 2 mg via INTRAVENOUS

## 2023-04-02 MED FILL — Heparin Sodium (Porcine) Inj 1000 Unit/ML: INTRAMUSCULAR | Qty: 30 | Status: AC

## 2023-04-02 MED FILL — Sodium Chloride IV Soln 0.9%: INTRAVENOUS | Qty: 2000 | Status: AC

## 2023-04-02 SURGICAL SUPPLY — 80 items
APL SKNCLS STERI-STRIP NONHPOA (GAUZE/BANDAGES/DRESSINGS) ×1
APPLIER CLIP 11 MED OPEN (CLIP) ×2
APR CLP MED 11 20 MLT OPN (CLIP) ×2
BAG COUNTER SPONGE SURGICOUNT (BAG) ×2 IMPLANT
BAG SPNG CNTER NS LX DISP (BAG) ×2
BENZOIN TINCTURE PRP APPL 2/3 (GAUZE/BANDAGES/DRESSINGS) IMPLANT
BLADE SURG 10 STRL SS (BLADE) ×1 IMPLANT
BONE VIVIGEN FORMABLE 5.4CC (Bone Implant) ×1 IMPLANT
CLIP APPLIE 11 MED OPEN (CLIP) ×2 IMPLANT
CLIP LIGATING EXTRA MED SLVR (CLIP) IMPLANT
CLSR STERI-STRIP ANTIMIC 1/2X4 (GAUZE/BANDAGES/DRESSINGS) IMPLANT
CORD BIPOLAR FORCEPS 12FT (ELECTRODE) ×1 IMPLANT
COVER SURGICAL LIGHT HANDLE (MISCELLANEOUS) ×1 IMPLANT
DRAPE C-ARM 42X72 X-RAY (DRAPES) ×2 IMPLANT
DRAPE POUCH INSTRU U-SHP 10X18 (DRAPES) ×1 IMPLANT
DRAPE SURG 17X23 STRL (DRAPES) ×3 IMPLANT
DRSG MEPILEX POST OP 4X12 (GAUZE/BANDAGES/DRESSINGS) ×1 IMPLANT
DURAPREP 26ML APPLICATOR (WOUND CARE) ×1 IMPLANT
ELECT BLADE 4.0 EZ CLEAN MEGAD (MISCELLANEOUS) ×2
ELECT CAUTERY BLADE 6.4 (BLADE) ×1 IMPLANT
ELECT REM PT RETURN 9FT ADLT (ELECTROSURGICAL) ×1
ELECTRODE BLDE 4.0 EZ CLN MEGD (MISCELLANEOUS) ×2 IMPLANT
ELECTRODE REM PT RTRN 9FT ADLT (ELECTROSURGICAL) ×1 IMPLANT
GAUZE SPONGE 4X4 12PLY STRL LF (GAUZE/BANDAGES/DRESSINGS) IMPLANT
GLOVE BIO SURGEON STRL SZ 6.5 (GLOVE) ×1 IMPLANT
GLOVE BIO SURGEON STRL SZ7.5 (GLOVE) ×1 IMPLANT
GLOVE BIO SURGEON STRL SZ8 (GLOVE) ×1 IMPLANT
GLOVE BIOGEL PI IND STRL 7.0 (GLOVE) ×1 IMPLANT
GLOVE BIOGEL PI IND STRL 8 (GLOVE) ×3 IMPLANT
GLOVE SURG ENC MOIS LTX SZ6.5 (GLOVE) ×1 IMPLANT
GOWN STRL REUS W/ TWL LRG LVL3 (GOWN DISPOSABLE) ×2 IMPLANT
GOWN STRL REUS W/ TWL XL LVL3 (GOWN DISPOSABLE) ×2 IMPLANT
GOWN STRL REUS W/TWL LRG LVL3 (GOWN DISPOSABLE) ×2
GOWN STRL REUS W/TWL XL LVL3 (GOWN DISPOSABLE) ×2
GRAFT BNE MATRIX VG FRMBL MD 5 (Bone Implant) IMPLANT
INSERT FOGARTY 61MM (MISCELLANEOUS) IMPLANT
INSERT FOGARTY SM (MISCELLANEOUS) IMPLANT
KIT BASIN OR (CUSTOM PROCEDURE TRAY) ×1 IMPLANT
KIT TURNOVER KIT B (KITS) ×1 IMPLANT
NDL HYPO 25GX1X1/2 BEV (NEEDLE) ×1 IMPLANT
NDL SPNL 18GX3.5 QUINCKE PK (NEEDLE) ×1 IMPLANT
NEEDLE HYPO 25GX1X1/2 BEV (NEEDLE) ×1 IMPLANT
NEEDLE SPNL 18GX3.5 QUINCKE PK (NEEDLE) ×1 IMPLANT
NS IRRIG 1000ML POUR BTL (IV SOLUTION) ×1 IMPLANT
PACK LAMINECTOMY ORTHO (CUSTOM PROCEDURE TRAY) ×1 IMPLANT
PACK UNIVERSAL I (CUSTOM PROCEDURE TRAY) ×1 IMPLANT
PAD ARMBOARD 7.5X6 YLW CONV (MISCELLANEOUS) ×4 IMPLANT
SCREW CERV FA SD 5.5X25 (Screw) IMPLANT
SPACER LORD MAG 34X8X26 15D (Spacer) IMPLANT
SPONGE INTESTINAL PEANUT (DISPOSABLE) ×4 IMPLANT
SPONGE SURGIFOAM ABS GEL 100 (HEMOSTASIS) ×2 IMPLANT
SPONGE T-LAP 18X18 ~~LOC~~+RFID (SPONGE) ×1 IMPLANT
SUT MNCRL AB 4-0 PS2 18 (SUTURE) ×1 IMPLANT
SUT PDS AB 1 CTX 36 (SUTURE) ×2 IMPLANT
SUT PROLENE 4 0 RB 1 (SUTURE)
SUT PROLENE 4-0 RB1 .5 CRCL 36 (SUTURE) IMPLANT
SUT PROLENE 5 0 CC1 (SUTURE) IMPLANT
SUT PROLENE 6 0 C 1 30 (SUTURE) IMPLANT
SUT PROLENE 6 0 CC (SUTURE) IMPLANT
SUT SILK 0 TIES 10X30 (SUTURE) IMPLANT
SUT SILK 2 0 TIES 10X30 (SUTURE) ×2 IMPLANT
SUT SILK 2 0SH CR/8 30 (SUTURE) IMPLANT
SUT SILK 3 0 TIES 10X30 (SUTURE) ×1 IMPLANT
SUT SILK 3 0 TIES 17X18 (SUTURE)
SUT SILK 3 0SH CR/8 30 (SUTURE) IMPLANT
SUT SILK 3-0 18XBRD TIE BLK (SUTURE) IMPLANT
SUT VIC AB 1 CT1 27 (SUTURE) ×2
SUT VIC AB 1 CT1 27XBRD ANBCTR (SUTURE) ×2 IMPLANT
SUT VIC AB 1 CTX 36 (SUTURE) ×2
SUT VIC AB 1 CTX36XBRD ANBCTR (SUTURE) ×2 IMPLANT
SUT VIC AB 2-0 CT2 18 VCP726D (SUTURE) ×1 IMPLANT
SUT VIC AB 2-0 SH 18 (SUTURE) IMPLANT
SYR BULB IRRIG 60ML STRL (SYRINGE) ×1 IMPLANT
TAPE CLOTH SURG 6X10 WHT LF (GAUZE/BANDAGES/DRESSINGS) IMPLANT
TOWEL GREEN STERILE (TOWEL DISPOSABLE) ×2 IMPLANT
TOWEL GREEN STERILE FF (TOWEL DISPOSABLE) ×1 IMPLANT
TRAY FOLEY W/BAG SLVR 16FR (SET/KITS/TRAYS/PACK) ×1
TRAY FOLEY W/BAG SLVR 16FR ST (SET/KITS/TRAYS/PACK) ×1 IMPLANT
WATER STERILE IRR 1000ML POUR (IV SOLUTION) ×1 IMPLANT
YANKAUER SUCT BULB TIP NO VENT (SUCTIONS) ×1 IMPLANT

## 2023-04-02 NOTE — H&P (Signed)
Alicia Blake is an 57 y.o. female.  HPI: Alicia Blake is S/P Lumbar ANT/POST fusion last week. She has been doing well with minimal well controlled PO pain. She Presented to PO appt in the office and was found to have progression of anterior migration of her L5-S1 intervertebral spacer compared to PO CT scan. Vascular surgery stated they could likely safely perform repeat abdominal exposure for 1 week PO and patient therefore was sent to ED for admission in anticipation of revision procedure to revise L5-S1 spacer. She has had slight increase in back pain and has presented to ED tonight. [name] plans surgery 8:30am tomorrow morning. She denies any radicular symptoms at this time. Denies any abdominal pain. NL B/B function. She is hungry and desiring her routine medications        Past Medical History:  Diagnosis Date   Asthma     Bipolar affect, depressed (HCC)     Bronchitis     Chronic back pain     Closed left ankle fracture     COPD (chronic obstructive pulmonary disease) (HCC)     DJD (degenerative joint disease)      BACK   GERD (gastroesophageal reflux disease)     Hypertension     Pneumonia     Pre-diabetes      dr entered diabetes without complications- no meds does not test sugar at home   PTSD (post-traumatic stress disorder)     Scoliosis     Seizures (HCC)      "when drinking" last yrs ago   Shortness of breath dyspnea      exersion   Substance abuse (HCC)      crack cocaine, 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, none since   Thyroid disease     UTI (lower urinary tract infection)             Past Surgical History:  Procedure Laterality Date   2 LEFT TOES SURGERY   12/2014    finished only on right side   ABDOMINAL EXPOSURE N/A 03/26/2023    Procedure: ABDOMINAL EXPOSURE;  Surgeon: Cephus Shelling, MD;  Location: [facility];  Service: Vascular;  Laterality: N/A;   ANTERIOR LUMBAR FUSION N/A 03/26/2023    Procedure: REMOVAL OF POSTERIOR HARDWARE;  Surgeon: Estill Bamberg, MD;   Location: [facility] OR;  Service: Orthopedics;  Laterality: N/A;   ANTERIOR LUMBAR FUSION N/A 03/26/2023    Procedure: LUMBAR FOUR - LUMBAR FIVE, LUMBAR FIVE - SACRUM ONE ANTERIOR LUMBAR INTERBODY FUSION WITH INSTRUMENTATION AND ALLOGRAFT;  Surgeon: Estill Bamberg, MD;  Location: [facility] OR;  Service: Orthopedics;  Laterality: N/A;   BACK SURGERY       BALLOON DILATION N/A 12/14/2015    Procedure: BALLOON DILATION;  Surgeon: [name], MD;  Location: [facility];  Service: Endoscopy;  Laterality: N/A;   COLONOSCOPY WITH PROPOFOL N/A 12/14/2015    Procedure: COLONOSCOPY WITH PROPOFOL;  Surgeon: [name], MD;  Location: [facility];  Service: Endoscopy;  Laterality: N/A;   ESOPHAGOGASTRODUODENOSCOPY (EGD) WITH PROPOFOL N/A 12/14/2015    Procedure: ESOPHAGOGASTRODUODENOSCOPY (EGD) WITH PROPOFOL;  Surgeon: [name], MD;  Location: [facility];  Service: Endoscopy;  Laterality: N/A;   FRACTURE SURGERY       JOINT REPLACEMENT        left hip   LEFT ANKLE SURGERY   12/2014   LUMBAR LAMINECTOMY/DECOMPRESSION MICRODISCECTOMY N/A 06/18/2017    Procedure: LAMINECTOMY AND FORAMINOTOMY LUMBAR FOUR - LUMBAR FIVE;  Surgeon: Coletta Memos, MD;  Location: [facility] OR;  Service: Neurosurgery;  Laterality: N/A;  LAMINECTOMY AND FORAMINOTOMY LUMBAR 4- LUMBAR 5   LUMBAR WOUND DEBRIDEMENT N/A 03/10/2020    Procedure: Lumbar Wound Debridement;  Surgeon: Coletta Memos, MD;  Location: [facility] OR;  Service: Neurosurgery;  Laterality: N/A;  posterior   NECK SURGERY   AS TEENAGER    STITCHES TO NECK    ORIF ANKLE FRACTURE Left 07/24/2021    Procedure: OPEN REDUCTION INTERNAL FIXATION (ORIF) ANKLE FRACTURE;  Surgeon: Sheral Apley, MD;  Location: [facility];  Service: Orthopedics;  Laterality: Left;   SURGERY FOR CUT ON STOMACH   TEENAGER   TOTAL HIP ARTHROPLASTY Left 05/14/2016    Procedure: TOTAL HIP ARTHROPLASTY ANTERIOR APPROACH;  Surgeon: Sheral Apley, MD;  Location: [facility] OR;  Service: Orthopedics;   Laterality: Left;           Family History  Problem Relation Age of Onset   Hypertension Mother     Hypertension Maternal Grandmother     Hypertension Paternal Grandmother     Diabetes Paternal Grandmother     Hypertension Paternal Grandfather     Diabetes Paternal Grandfather     Alcohol abuse Paternal Aunt     Mental illness Cousin     Heart attack Cousin     Heart attack Maternal Uncle        Social History:  reports that she has been smoking cigarettes. She has a 13.00 pack-year smoking history. She has never used smokeless tobacco. She reports that she does not drink alcohol and does not use drugs.   Allergies:       Allergies  Allergen Reactions   Risperidone And Related Swelling and Other (See Comments)      Facial swelling      Medications: Continuous:  methocarbamol (ROBAXIN) IV        Lab Results Last 48 Hours        Results for orders placed or performed during the hospital encounter of 04/01/23 (from the past 48 hour(s))  CBC with Differential     Status: Abnormal    Collection Time: 04/01/23  8:25 AM  Result Value Ref Range    WBC 12.9 (H) 4.0 - 10.5 K/uL    RBC 3.07 (L) 3.87 - 5.11 MIL/uL    Hemoglobin 8.9 (L) 12.0 - 15.0 g/dL    HCT 09.8 (L) 11.9 - 46.0 %    MCV 89.9 80.0 - 100.0 fL    MCH 29.0 26.0 - 34.0 pg    MCHC 32.2 30.0 - 36.0 g/dL    RDW 14.7 82.9 - 56.2 %    Platelets 286 150 - 400 K/uL    nRBC 0.2 0.0 - 0.2 %    Neutrophils Relative % 77 %    Neutro Abs 9.9 (H) 1.7 - 7.7 K/uL    Lymphocytes Relative 16 %    Lymphs Abs 2.0 0.7 - 4.0 K/uL    Monocytes Relative 5 %    Monocytes Absolute 0.6 0.1 - 1.0 K/uL    Eosinophils Relative 1 %    Eosinophils Absolute 0.1 0.0 - 0.5 K/uL    Basophils Relative 0 %    Basophils Absolute 0.0 0.0 - 0.1 K/uL    Immature Granulocytes 1 %    Abs Immature Granulocytes 0.13 (H) 0.00 - 0.07 K/uL      Comment: Performed at [facility], [address] [place], [place] [address]  Basic metabolic panel      Status: Abnormal  Collection Time: 04/01/23  8:25 AM  Result Value Ref Range    Sodium 136 135 - 145 mmol/L    Potassium 4.1 3.5 - 5.1 mmol/L    Chloride 99 98 - 111 mmol/L    CO2 29 22 - 32 mmol/L    Glucose, Bld 122 (H) 70 - 99 mg/dL      Comment: Glucose reference range applies only to samples taken after fasting for at least 8 hours.    BUN 9 6 - 20 mg/dL    Creatinine, Ser 7.25 0.44 - 1.00 mg/dL    Calcium 8.9 8.9 - 36.6 mg/dL    GFR, Estimated >44 >03 mL/min      Comment: (NOTE) Calculated using the CKD-EPI Creatinine Equation (2021)      Anion gap 8 5 - 15      Comment: Performed at [facility], [address] [place], [place] [address]        Imaging Results (Last 48 hours)  No results found.     Review of Systems  Constitutional:  Negative for chills and diaphoresis.  Respiratory:  Negative for apnea, cough and wheezing.   Cardiovascular:  Negative for leg swelling.  Gastrointestinal:  Negative for abdominal distention, abdominal pain, constipation, diarrhea, nausea and vomiting.  Musculoskeletal:  Positive for back pain.  Neurological: Negative.     Blood pressure 126/62, pulse 96, temperature 99.1 F (37.3 C), temperature source Oral, resp. rate 16, height 5\' 6"  (1.676 m), weight 106.1 kg, last menstrual period 12/09/2015, SpO2 100 %. Physical Exam Vitals reviewed.  Constitutional:      General: She is not in acute distress.    Appearance: Normal appearance. She is obese. She is not ill-appearing or diaphoretic.  HENT:     Head: Normocephalic and atraumatic.     Nose: Nose normal.     Mouth/Throat:     Pharynx: No oropharyngeal exudate.  Eyes:     Extraocular Movements: Extraocular movements intact.  Pulmonary:     Effort: Pulmonary effort is normal.  Abdominal:     General: There is no distension.     Palpations: Abdomen is soft.     Tenderness: There is no abdominal tenderness. There is no guarding.  Neurological:     Mental Status: She  is alert.    TLSO brace at bedside pt resting comfortably in hallway bed.    Assessment/Plan:      1 Week PO ANT/POST L4-S1 fusion with anterior migration of L5-S1 intervertebral spacer requiring revision and replacement.              -Vascular sx onboard for repeat exposure 8:30am             -[name] taking to sx 8:30am             -NPO midnight, diet regular until then             -Cont Oxycodone/robaxin for pain control             -TLSO brace when OOB

## 2023-04-02 NOTE — Plan of Care (Signed)

## 2023-04-02 NOTE — Anesthesia Postprocedure Evaluation (Signed)
Anesthesia Post Note  Patient: Alicia Blake  Procedure(s) Performed: REVISION ANTERIOR LUMBAR FUSION L5-S1 ABDOMINAL EXPOSURE     Patient location during evaluation: PACU Anesthesia Type: General Level of consciousness: awake and alert Pain management: pain level controlled Vital Signs Assessment: post-procedure vital signs reviewed and stable Respiratory status: spontaneous breathing, nonlabored ventilation and respiratory function stable Cardiovascular status: stable and blood pressure returned to baseline Anesthetic complications: no   No notable events documented.  Last Vitals:  Vitals:   04/02/23 1300 04/02/23 1347  BP: 125/67 (!) 151/65  Pulse: 91 (!) 110  Resp: 18 18  Temp: 36.4 C 36.5 C  SpO2: 92% 94%    Last Pain:  Vitals:   04/02/23 1300  TempSrc:   PainSc: Asleep                 Beryle Lathe

## 2023-04-02 NOTE — Op Note (Signed)
PATIENT NAME: Alicia Blake   MEDICAL RECORD NO.:   [id number]    DATE OF BIRTH: 1966-06-06   DATE OF PROCEDURE: 04/02/2023                              OPERATIVE REPORT   PREOPERATIVE DIAGNOSES: 1. 1 week status post L5-S1 anterior lumbar interbody fusion, with subsequent anterior migration of the patient's intervertebral spacer  POSTOPERATIVE DIAGNOSES: 1. 1 week status post L5-S1 anterior lumbar interbody fusion, with subsequent anterior migration of the patient's intervertebral spacer  PROCEDURE: 1. Revision anterior lumbar interbody fusion, L5-S1 (expsure peformed by Dr. Clotilde Dieter) 2. Removal and reinsertion of interbody device x1 (Magnify, expandable intervertebral spacer). 3. Placement of anterior instrumentation securing the L5-S1 level. 4. Intraoperative use of fluoroscopy. 5. Use of morselized allograft - Vivigen 6. Co-surgeon with Dr. Clotilde Dieter for retroperitoneal exposure  SURGEON:  Estill Bamberg, MD  ASSISTANT:  Jason Coop, PA-C  ANESTHESIA:  General endotracheal anesthesia.  COMPLICATIONS:  None.  DISPOSITION:  Stable.  ESTIMATED BLOOD LOSS:  minimal  INDICATIONS FOR SURGERY:  Briefly, Alicia Blake is a very pleasant 57 year old female who is status post an uneventful anterior lumbar interbody fusion from 7 days prior.  The patient did very well after surgery, but postoperative radiographs did reveal progressive anterior migration of her L5-S1 intervertebral spacer.  I did discuss the situation with the vascular surgeon performing the approach, Dr. Clotilde Dieter.  He did express to me that there was a very narrow window of time when we would be able to proceed with a revision anterior approach, in the event that the intervertebral spacer did additionally migrate.  Given this, I did discuss this with the patient, and the patient and myself did make the decision to proceed with a revision procedure, while we were still within the timeframe where it was safe to do  so.  The alternative would be to not proceed with surgery, and simply hope that the patient's intervertebral spacer did not continue to migrate, which I did not feel would be the best plan of action, because if this were to occur, we would not be able to proceed with an anterior approach after about another week.  Therefore, the patient did present to the emergency department yesterday, was admitted to the floor, made n.p.o. last night, and presented this morning for the surgery noted above. The patient was fully aware of the risks and limitations associated with surgery, and did wish to proceed.     OPERATIVE DETAILS:  On 04/02/2023, the patient was brought to surgery and general endotracheal anesthesia was administered.  The patient was placed supine on the hospital bed.  The patient's abdomen was prepped and draped in the usual sterile fashion.  The longitudinal wound was opened, and the previously placed stitches were identified and removed.  Pulsatile irrigation was utilized, to minimize the risk of infection. An anterior retroperitoneal approach was then performed by myself and Dr. Clotilde Dieter.  Once the anterior lumbar spine was noted, we did focus our attention on the L5-S1 intervertebral space.  The previously placed spacer was identified, and was noted to be anteriorly migrated by about 50%, which was even farther than what was noted on the patient's radiographs.  The spacer was removed.  I did irrigate out the intervertebral space and I did additionally gently decorticate the endplates at L5 and S1.  A Magnify, expandable intervertebral spacer was then  advanced into the intervertebral space and expanded to approximately 11 mm in height, after packing the implant with Vivigen.  A screw was then placed through the spacer into the S1 vertebral body uneventfully, securing the intervertebral spacer into the L5-S1 disc space.  I was very pleased with the final AP and lateral fluoroscopic images.  The  retractor was then removed.  The wound was then irrigated and closed in layers, using #1 PDS, followed by 2-0 Vicryl, followed by 4-0 Monocryl. Benzoin and Steri-Strips were applied followed by sterile dressing.  All instrument counts were correct at the termination of the procedure.   Of note, Jason Coop was my assistant throughout surgery, and did aid in retraction, suctioning, placement of the hardware, and closure for the surgery   Estill Bamberg, MD

## 2023-04-02 NOTE — Transfer of Care (Signed)
Immediate Anesthesia Transfer of Care Note  Patient: Alicia Blake  Procedure(s) Performed: REVISION ANTERIOR LUMBAR FUSION L5-S1 ABDOMINAL EXPOSURE  Patient Location: PACU  Anesthesia Type:General  Level of Consciousness: drowsy, patient cooperative, and responds to stimulation  Airway & Oxygen Therapy: Patient Spontanous Breathing and Patient connected to face mask oxygen  Post-op Assessment: Report given to RN, Post -op Vital signs reviewed and stable, and Patient moving all extremities X 4  Post vital signs: Reviewed and stable  Last Vitals:  Vitals Value Taken Time  BP 156/66 04/02/23 1138  Temp    Pulse 100 04/02/23 1139  Resp 20 04/02/23 1139  SpO2 98 % 04/02/23 1139  Vitals shown include unvalidated device data.  Last Pain:  Vitals:   04/02/23 0750  TempSrc:   PainSc: Asleep         Complications: No notable events documented.

## 2023-04-02 NOTE — Anesthesia Procedure Notes (Signed)
Procedure Name: Intubation Date/Time: 04/02/2023 8:53 AM  Performed by: Shary Decamp, CRNAPre-anesthesia Checklist: Patient identified, Patient being monitored, Timeout performed, Emergency Drugs available and Suction available Patient Re-evaluated:Patient Re-evaluated prior to induction Oxygen Delivery Method: Circle system utilized Preoxygenation: Pre-oxygenation with 100% oxygen Induction Type: IV induction Ventilation: Mask ventilation without difficulty Laryngoscope Size: Mac, 3 and Glidescope Grade View: Grade I Tube type: Oral Tube size: 7.0 mm Number of attempts: 1 Airway Equipment and Method: Video-laryngoscopy and Rigid stylet Placement Confirmation: ETT inserted through vocal cords under direct vision, positive ETCO2 and breath sounds checked- equal and bilateral Secured at: 21 cm Tube secured with: Tape Dental Injury: Teeth and Oropharynx as per pre-operative assessment  Difficulty Due To: Difficult Airway- due to limited oral opening and Difficult Airway- due to large tongue

## 2023-04-02 NOTE — H&P (Signed)
H&P     History of Present Illness: This is a 57 y.o. female with history of COPD and hypertension that underwent anterior spine exposure last week for L4-L5 and L5-S1 ALIF.  This was uncomplicated.  There was anterior migration of her L5-S1 hardware after surgery.  She presents for revision.  No new complaints.  Past Medical History:  Diagnosis Date   Asthma    Bipolar affect, depressed (HCC)    Bronchitis    Chronic back pain    Closed left ankle fracture    COPD (chronic obstructive pulmonary disease) (HCC)    DJD (degenerative joint disease)    BACK   GERD (gastroesophageal reflux disease)    Hypertension    Pneumonia    Pre-diabetes    dr entered diabetes without complications- no meds does not test sugar at home   PTSD (post-traumatic stress disorder)    Scoliosis    Seizures (HCC)    "when drinking" last yrs ago   Shortness of breath dyspnea    exersion   Substance abuse (HCC)    crack cocaine, 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, none since   Thyroid disease    UTI (lower urinary tract infection)     Past Surgical History:  Procedure Laterality Date   2 LEFT TOES SURGERY  12/2014   finished only on right side   ABDOMINAL EXPOSURE N/A 03/26/2023   Procedure: ABDOMINAL EXPOSURE;  Surgeon: Cephus Shelling, MD;  Location: [facility] OR;  Service: Vascular;  Laterality: N/A;   ANTERIOR LUMBAR FUSION N/A 03/26/2023   Procedure: REMOVAL OF POSTERIOR HARDWARE;  Surgeon: Estill Bamberg, MD;  Location: [facility] OR;  Service: Orthopedics;  Laterality: N/A;   ANTERIOR LUMBAR FUSION N/A 03/26/2023   Procedure: LUMBAR FOUR - LUMBAR FIVE, LUMBAR FIVE - SACRUM ONE ANTERIOR LUMBAR INTERBODY FUSION WITH INSTRUMENTATION AND ALLOGRAFT;  Surgeon: Estill Bamberg, MD;  Location: [facility] OR;  Service: Orthopedics;  Laterality: N/A;   BACK SURGERY     BALLOON DILATION N/A 12/14/2015   Procedure: BALLOON DILATION;  Surgeon: [name], MD;  Location: [facility] ENDOSCOPY;  Service: Endoscopy;  Laterality: N/A;    COLONOSCOPY WITH PROPOFOL N/A 12/14/2015   Procedure: COLONOSCOPY WITH PROPOFOL;  Surgeon: [name], MD;  Location: [facility]COPY;  Service: Endoscopy;  Laterality: N/A;   ESOPHAGOGASTRODUODENOSCOPY (EGD) WITH PROPOFOL N/A 12/14/2015   Procedure: ESOPHAGOGASTRODUODENOSCOPY (EGD) WITH PROPOFOL;  Surgeon: [name], MD;  Location: [facility];  Service: Endoscopy;  Laterality: N/A;   FRACTURE SURGERY     JOINT REPLACEMENT     left hip   LEFT ANKLE SURGERY  12/2014   LUMBAR LAMINECTOMY/DECOMPRESSION MICRODISCECTOMY N/A 06/18/2017   Procedure: LAMINECTOMY AND FORAMINOTOMY LUMBAR FOUR - LUMBAR FIVE;  Surgeon: Coletta Memos, MD;  Location: [facility] OR;  Service: Neurosurgery;  Laterality: N/A;  LAMINECTOMY AND FORAMINOTOMY LUMBAR 4- LUMBAR 5   LUMBAR WOUND DEBRIDEMENT N/A 03/10/2020   Procedure: Lumbar Wound Debridement;  Surgeon: Coletta Memos, MD;  Location: [facility] OR;  Service: Neurosurgery;  Laterality: N/A;  posterior   NECK SURGERY  AS TEENAGER   STITCHES TO NECK    ORIF ANKLE FRACTURE Left 07/24/2021   Procedure: OPEN REDUCTION INTERNAL FIXATION (ORIF) ANKLE FRACTURE;  Surgeon: Sheral Apley, MD;  Location: [facility];  Service: Orthopedics;  Laterality: Left;   SURGERY FOR CUT ON STOMACH  TEENAGER   TOTAL HIP ARTHROPLASTY Left 05/14/2016   Procedure: TOTAL HIP ARTHROPLASTY ANTERIOR APPROACH;  Surgeon: Sheral Apley, MD;  Location: [facility] OR;  Service: Orthopedics;  Laterality: Left;    Allergies  Allergen Reactions   Risperidone And Related Swelling and Other (See Comments)    Facial swelling    Prior to Admission medications   Medication Sig Start Date End Date Taking? Authorizing Provider  ABILIFY MAINTENA 400 MG PRSY prefilled syringe Inject 400 mg into the muscle every 28 (twenty-eight) days. 06/13/21  Yes [provider]  acetaminophen (TYLENOL) 325 MG tablet Take 650 mg by mouth every 6 (six) hours as needed for moderate pain.   Yes [provider]   albuterol (VENTOLIN HFA) 108 (90 Base) MCG/ACT inhaler Inhale 2 puffs into the lungs every 6 (six) hours as needed for wheezing. 12/17/21  Yes Raspet, Noberto Retort, PA-C  aspirin EC 81 MG tablet Take 1 tablet (81 mg total) by mouth 2 (two) times daily. For DVT prophylaxis for 30 days after surgery. 07/24/21  Yes Gawne, Meghan M, PA-C  benztropine (COGENTIN) 0.5 MG tablet Take 0.5 mg by mouth 2 (two) times daily.   Yes [provider]  budesonide-formoterol (SYMBICORT) 160-4.5 MCG/ACT inhaler Inhale 2 puffs into the lungs in the morning and at bedtime. 11/27/21  Yes Crain, Whitney L, PA  cetirizine (ZYRTEC) 10 MG tablet Take 10 mg by mouth daily. 05/30/22  Yes [provider]  cyclobenzaprine (FLEXERIL) 5 MG tablet Take 5-10 mg by mouth at bedtime as needed for muscle spasms. 03/07/23  Yes [provider]  esomeprazole (NEXIUM) 40 MG capsule Take 40 mg by mouth daily. 09/22/19  Yes [provider]  fluticasone (FLONASE) 50 MCG/ACT nasal spray Place 2 sprays into both nostrils daily as needed for allergies.   Yes [provider]  furosemide (LASIX) 40 MG tablet Take 40 mg by mouth 2 (two) times daily. 10/30/22  Yes [provider]  hydrOXYzine (VISTARIL) 25 MG capsule Take 25 mg by mouth daily. 06/16/19  Yes [provider]  lithium 300 MG tablet Take 300 mg by mouth 2 (two) times daily. 10/07/22  Yes [provider]  losartan (COZAAR) 25 MG tablet Take 25 mg by mouth daily. 09/18/19  Yes Norm Salt, PA  methocarbamol (ROBAXIN) 500 MG tablet Take 1 tablet (500 mg total) by mouth every 6 (six) hours as needed for muscle spasms. 03/28/23  Yes Estill Bamberg, MD  montelukast (SINGULAIR) 10 MG tablet Take 10 mg by mouth daily. 04/24/22  Yes [provider]  oxybutynin (DITROPAN-XL) 10 MG 24 hr tablet Take 10 mg by mouth daily.   Yes [provider]  oxyCODONE-acetaminophen (PERCOCET/ROXICET) 5-325 MG tablet Take 1-2 tablets  by mouth every 4 (four) hours as needed for severe pain. 03/28/23  Yes Estill Bamberg, MD  PARoxetine (PAXIL-CR) 25 MG 24 hr tablet Take 25 mg by mouth daily. 10/07/22  Yes [provider]  potassium chloride (KLOR-CON) 10 MEQ tablet Take 10 mEq by mouth daily. 04/24/22  Yes [provider]  QUEtiapine (SEROQUEL) 200 MG tablet Take 300 mg by mouth at bedtime. 10/29/22  Yes [provider]  tiZANidine (ZANAFLEX) 4 MG tablet Take 4 mg by mouth 2 (two) times daily as needed for muscle spasms.   Yes [provider]  albuterol (PROVENTIL) (2.5 MG/3ML) 0.083% nebulizer solution Take 3 mLs (2.5 mg total) by nebulization every 6 (six) hours as needed for wheezing or shortness of breath. Patient not taking: Reported on 04/01/2023 12/17/21   Raspet, Noberto Retort, PA-C    Social History   Socioeconomic History   Marital status: Single  Spouse name: Not on file   Number of children: Not on file   Years of education: Not on file   Highest education level: Not on file  Occupational History   Not on file  Tobacco Use   Smoking status: Every Day    Packs/day: 0.50    Years: 26.00    Additional pack years: 0.00    Total pack years: 13.00    Types: Cigarettes   Smokeless tobacco: Never  Vaping Use   Vaping Use: Never used  Substance and Sexual Activity   Alcohol use: No    Comment: quit date 01/17/2013   Drug use: No    Types: "Crack" cocaine, Heroin    Comment: none since 03-2020   Sexual activity: Yes    Birth control/protection: None, Post-menopausal  Other Topics Concern   Not on file  Social History Narrative   Not on file   Social Determinants of Health   Financial Resource Strain: Not on file  Food Insecurity: Not on file  Transportation Needs: Not on file  Physical Activity: Not on file  Stress: Not on file  Social Connections: Not on file  Intimate Partner Violence: Not on file     Family History  Problem Relation Age of Onset   Hypertension  Mother    Hypertension Maternal Grandmother    Hypertension Paternal Grandmother    Diabetes Paternal Grandmother    Hypertension Paternal Grandfather    Diabetes Paternal Grandfather    Alcohol abuse Paternal Aunt    Mental illness Cousin    Heart attack Cousin    Heart attack Maternal Uncle     ROS: [x]  Positive   [ ]  Negative   [ ]  All sytems reviewed and are negative  Cardiovascular: []  chest pain/pressure []  palpitations []  SOB lying flat []  DOE []  pain in legs while walking []  pain in legs at rest []  pain in legs at night []  non-healing ulcers []  hx of DVT []  swelling in legs  Pulmonary: []  productive cough []  asthma/wheezing []  home O2  Neurologic: []  weakness in []  arms []  legs []  numbness in []  arms []  legs []  hx of CVA []  mini stroke [] difficulty speaking or slurred speech []  temporary loss of vision in one eye []  dizziness  Hematologic: []  hx of cancer []  bleeding problems []  problems with blood clotting easily  Endocrine:   []  diabetes []  thyroid disease  GI []  vomiting blood []  blood in stool  GU: []  CKD/renal failure []  HD--[]  M/W/F or []  T/T/S []  burning with urination []  blood in urine  Psychiatric: []  anxiety []  depression  Musculoskeletal: []  arthritis []  joint pain  Integumentary: []  rashes []  ulcers  Constitutional: []  fever []  chills   Physical Examination  Vitals:   04/02/23 0441 04/02/23 0746  BP: (!) 104/55 114/72  Pulse: 91 78  Resp: 20   Temp: 99.3 F (37.4 C) 97.6 F (36.4 C)  SpO2:  97%   Body mass index is 37.77 kg/m.  General:  NAD Gait: Not observed HENT: WNL, normocephalic Pulmonary: normal non-labored breathing Cardiac: regular, without  Murmurs, rubs or gallops Abdomen:  soft, NT/ND; abdominal incision clean dry and intact with paramedian incision Vascular Exam/Pulses: Left DP palpable  CBC    Component Value Date/Time   WBC 12.9 (H) 04/01/2023 0825   RBC 3.07 (L) 04/01/2023 0825    HGB 8.9 (L) 04/01/2023 0825   HCT 27.6 (L) 04/01/2023 0825   PLT 286 04/01/2023 0825   MCV  89.9 04/01/2023 0825   MCH 29.0 04/01/2023 0825   MCHC 32.2 04/01/2023 0825   RDW 15.2 04/01/2023 0825   LYMPHSABS 2.0 04/01/2023 0825   MONOABS 0.6 04/01/2023 0825   EOSABS 0.1 04/01/2023 0825   BASOSABS 0.0 04/01/2023 0825    BMET    Component Value Date/Time   NA 136 04/01/2023 0825   K 4.1 04/01/2023 0825   CL 99 04/01/2023 0825   CO2 29 04/01/2023 0825   GLUCOSE 122 (H) 04/01/2023 0825   BUN 9 04/01/2023 0825   CREATININE 0.83 04/01/2023 0825   CREATININE 0.87 03/15/2021 1503   CALCIUM 8.9 04/01/2023 0825   GFRNONAA >60 04/01/2023 0825   GFRNONAA 75 03/15/2021 1503   GFRAA 87 03/15/2021 1503    COAGS: Lab Results  Component Value Date   INR 1.01 05/07/2016     Non-Invasive Vascular Imaging:    N/A  ASSESSMENT/PLAN: This is a 57 y.o. female with history of COPD and hypertension that underwent anterior spine exposure last week for L4-L5 and L5-S1 ALIF.  This was uncomplicated.  There was anterior migration of her L5-S1 hardware.  Discussed plan to reopen her left paramedian incision with redo exposure of the L5-S1 disc space so Dr. Yevette Edwards can revise her hardware.  Risk benefits discussed.  Increased risk of hernia and wound problems as discussed.  She wishes to proceed.  Cephus Shelling, MD [facility] Office: [phone]  Cephus Shelling

## 2023-04-02 NOTE — Op Note (Signed)
Date: April 02, 2023  Preoperative diagnosis: Chronic lower back pain  Postoperative diagnosis: Same  Procedure: Re-do anterior spine exposure for revision of L5-S1 ALIF via anterior retroperitoneal approach  Surgeon: Dr. Cephus Shelling, MD  Co-surgeon: Dr. Estill Bamberg, MD  Indications: 57 year old female with chronic lower back pain with neuropathic leg pain.  She previously underwent anterior lumbar interbody fusion at L4-L5 and L5-S1 last week on 03/26/2023.  This was uncomplicated.  Postoperatively it was noted that her L5-S1 implant had anterior migration.  She presents today for revision after discussion with Dr. Yevette Edwards.  Risk benefits discussed with the patient and also discussed increased risk of hernia and wound healing problems.  Findings: The paramedian incision over the left abdominal wall was reopened including the fascial closure.  I re-entered into the retroperitoneum by mobilizing peritoneum and left ureter to the midline through the previous dissection last week.  The L5-S1 disc space was already exposed anteriorly.  We visualized the L5-S1 implant.  We placed a fixed NuVasive retractor on the field and had good anterior exposure of the disc space.  Anesthesia: General  Details: Patient was taken to the operating room after informed consent was obtained.  Placed on the operative table in supine position.  General endotracheal anesthesia was induced.  The abdominal wall was then prepped and draped in standard sterile fashion.  Timeout was performed and antibiotics were given.  We reopened the previous paramedian incision over the left rectus muscle.  All the skin suture as well as the subcutaneous suture was reopened.  A lavage was done to irrigate the incision and subcutaenous tissue.  The fascial stitch was opened by cutting the PDS in the anterior rectus sheath.  I went lateral to the rectus muscle and then used hand-held Wiley retractors to pull the peritoneum and left  ureter to the midline.  I reentered into the retroperitoneum and visualized the left iliac artery and vein.  I visualized the implant at L5-S1.  We had already previously mobilized at this disc space anteriorly last week including on both sides of the disc space.  A fixed NuVasive retractor was placed on the field.  A 200 reverse lip was placed to the right of the disc space with a 180 reverse lip to the left of the disc space.  Placed a 140 reverse lip cranial.  We had good anterior exposure of the L5-S1 disc space.  Case was turned over to Dr. Yevette Edwards.  Please see Dr. Yevette Edwards dictation for the remainder of the case.  Complication: None  Condition: Stable  Cephus Shelling, MD [facility] Office: [phone]   Cephus Shelling

## 2023-04-02 NOTE — Evaluation (Signed)
Occupational Therapy Evaluation Patient Details Name: Alicia Blake MRN: [id number] DOB: 1966-02-27 Today's Date: 04/02/2023   History of Present Illness 57 yo female s/p ALIF L4-S1, removal of posterior instrumentation bilat L5-S1 on 5/29; posterior spinal fusion L3-S1, L4-S1 posterior lumbar decompression and placement of posterior segmental instrumentation L4-S1 on 5/30. She is now s/p revision anterior lumbar fusion L5-S1 04/01/22. PMH includes bipolar disorder, GERD, preDM, PTSD, seizures, substance abuse none since 2021, previous lumbar laminectomy with nonunion 2018, L THA 2017, L ankle ORIF 2022.   Clinical Impression   Pt admitted for above dx, recent readmit for revision of lumbar fusion, Prior to recent admission pt reports sleeping on couch and having mother assist with sponge bathing. Pt currently presenting with back pain that increases with OOB mobility, pt demonstrated ability to don/doff lower body clothing with min guard assist and ambulated in the hall with min guard assist. Pt with decreased recall of log rolling and POB precautions, reinforced/continued education on back precautions and log rolling during session and by end of session pt was also able to state 3/3 back precautions.  Discussed with patient and DME that was procured for home, pt has no tub bench which would reduce caregiver burden and promote independence. Pt would benefit from continued acute skilled OT to address above deficits and help transition to next level of care. No follow-up OT recommended, will attempt to follow-up with patient tomorrow to trial tub bench transfer if patient would like to progress from sponge bathing.     Recommendations for follow up therapy are one component of a multi-disciplinary discharge planning process, led by the attending physician.  Recommendations may be updated based on patient status, additional functional criteria and insurance authorization.   Assistance Recommended at  Discharge Set up Supervision/Assistance  Patient can return home with the following Assist for transportation;Help with stairs or ramp for entrance;Assistance with cooking/housework    Functional Status Assessment  Patient has had a recent decline in their functional status and demonstrates the ability to make significant improvements in function in a reasonable and predictable amount of time.  Equipment Recommendations  Tub/shower bench    Recommendations for Other Services       Precautions / Restrictions Precautions Precautions: Back Precaution Booklet Issued: Yes (comment) Required Braces or Orthoses: Spinal Brace Spinal Brace: Thoracolumbosacral orthotic;Applied in sitting position Restrictions Weight Bearing Restrictions: No      Mobility Bed Mobility Overal bed mobility: Needs Assistance Bed Mobility: Rolling, Sidelying to Sit Rolling: Min assist Sidelying to sit: Min guard       General bed mobility comments: Pt with decreased recall of log rolling technique, needing cues to sequence mobility. TLSO donned in session once EOB, pt requesting to be left seated EOB    Transfers Overall transfer level: Needs assistance Equipment used: Rolling walker (2 wheels) Transfers: Sit to/from Stand Sit to Stand: Min guard           General transfer comment: verbal cues for hand placement on RW      Balance Overall balance assessment: Needs assistance Sitting-balance support: No upper extremity supported Sitting balance-Leahy Scale: Fair     Standing balance support: Bilateral upper extremity supported Standing balance-Leahy Scale: Poor Standing balance comment: Using RW and walls of bathroom to support self during ambulation                           ADL either performed or assessed with clinical judgement  ADL   Eating/Feeding: Independent;Sitting   Grooming: Standing;Min guard   Upper Body Bathing: Independent;Sitting   Lower Body Bathing:  Sitting/lateral leans;Min guard   Upper Body Dressing : Sitting;Independent   Lower Body Dressing: Sitting/lateral leans;Min guard   Toilet Transfer: Ambulation;Min guard;Rolling walker (2 wheels)   Toileting- Clothing Manipulation and Hygiene: Supervision/safety;Sitting/lateral lean       Functional mobility during ADLs: Min guard;Rolling walker (2 wheels)       Vision         Perception     Praxis      Pertinent Vitals/Pain Pain Assessment Pain Assessment: 0-10 Pain Score: 6  Pain Location: Back; incision site Pain Descriptors / Indicators: Discomfort, Sore Pain Intervention(s): Monitored during session, Repositioned     Hand Dominance Right   Extremity/Trunk Assessment Upper Extremity Assessment Upper Extremity Assessment: Overall WFL for tasks assessed   Lower Extremity Assessment Lower Extremity Assessment: Defer to PT evaluation   Cervical / Trunk Assessment Cervical / Trunk Assessment: Back Surgery   Communication Communication Communication: No difficulties   Cognition Arousal/Alertness: Awake/alert Behavior During Therapy: WFL for tasks assessed/performed Overall Cognitive Status: Within Functional Limits for tasks assessed                                       General Comments  VSS on RA    Exercises     Shoulder Instructions      Home Living Family/patient expects to be discharged to:: Private residence Living Arrangements: Alone Available Help at Discharge: Family (mom to stay with pt at d/c) Type of Home: House Home Access: Stairs to enter [employer] of Steps: 2   Home Layout: Two level;1/2 bath on main level Alternate Level Stairs-Number of Steps: plans to live on the first floor   Bathroom Shower/Tub: Chief Strategy Officer: Standard Bathroom Accessibility: Yes   Home Equipment: Cane - single point;Shower seat;BSC/3in1;Rolling Environmental consultant (2 wheels)          Prior  Functioning/Environment Prior Level of Function : Needs assist             Mobility Comments: pt reports using cane for gait PTA ADLs Comments: ind before recent surgeries. Upon recent DC, pt has mother assisting with sponge bathing and sleeps on couch        OT Problem List: Decreased activity tolerance;Impaired balance (sitting and/or standing);Pain;Decreased knowledge of precautions      OT Treatment/Interventions: Self-care/ADL training;Therapeutic exercise;DME and/or AE instruction;Energy conservation;Therapeutic activities;Patient/family education;Balance training    OT Goals(Current goals can be found in the care plan section) Acute Rehab OT Goals Patient Stated Goal: To go home OT Goal Formulation: With patient Time For Goal Achievement: 04/16/23 Potential to Achieve Goals: Good  OT Frequency: Min 2X/week    Co-evaluation              AM-PAC OT "6 Clicks" Daily Activity     Outcome Measure Help from another person eating meals?: None Help from another person taking care of personal grooming?: A Little Help from another person toileting, which includes using toliet, bedpan, or urinal?: A Little Help from another person bathing (including washing, rinsing, drying)?: A Little Help from another person to put on and taking off regular upper body clothing?: None Help from another person to put on and taking off regular lower body clothing?: A Little 6 Click Score: 20   End of Session  Equipment Utilized During Treatment: Rolling walker (2 wheels);Gait belt;Back brace Nurse Communication: Mobility status  Activity Tolerance: Patient tolerated treatment well Patient left: in bed;with call bell/phone within reach  OT Visit Diagnosis: Unsteadiness on feet (R26.81);Other abnormalities of gait and mobility (R26.89);Pain Pain - part of body:  (back)                Time: 1725-1746 OT Time Calculation (min): 21 min Charges:  OT General Charges $OT Visit: 1 Visit OT  Evaluation $OT Eval Low Complexity: 1 Low  04/02/2023  AB, OTR/L  Acute Rehabilitation Services  Office: [phone]   Tristan Schroeder 04/02/2023, 6:04 PM

## 2023-04-03 MED FILL — Thrombin (Recombinant) For Soln 20000 Unit: CUTANEOUS | Qty: 1 | Status: AC

## 2023-04-03 NOTE — Evaluation (Signed)
Physical Therapy Evaluation Patient Details Name: Alicia Blake MRN: [id number] DOB: August 14, 1966 Today's Date: 04/03/2023  History of Present Illness  57 yo female s/p ALIF L4-S1, removal of posterior instrumentation bilat L5-S1 on 5/29; posterior spinal fusion L3-S1, L4-S1 posterior lumbar decompression and placement of posterior segmental instrumentation L4-S1 on 5/30. She is now s/p revision ALIF L5-S1 04/02/23. PMH includes bipolar disorder, GERD, preDM, PTSD, seizures, substance abuse none since 2021, previous lumbar laminectomy with nonunion 2018, L THA 2017, L ankle ORIF 2022.   Clinical Impression  PT eval complete. Pt min guard assist for bed mobility, transfers, and amb 100' with RW. Pt's mother able to provide needed level of assist at home. Back precautions reviewed. Pt has all needed DME.  Plan is for d/c home today. PT signing off.     Recommendations for follow up therapy are one component of a multi-disciplinary discharge planning process, led by the attending physician.  Recommendations may be updated based on patient status, additional functional criteria and insurance authorization.  Follow Up Recommendations       Assistance Recommended at Discharge Intermittent Supervision/Assistance  Patient can return home with the following  A little help with walking and/or transfers;A little help with bathing/dressing/bathroom;Assist for transportation;Assistance with cooking/housework;Help with stairs or ramp for entrance    Equipment Recommendations None recommended by PT  Recommendations for Other Services       Functional Status Assessment Patient has had a recent decline in their functional status and demonstrates the ability to make significant improvements in function in a reasonable and predictable amount of time.     Precautions / Restrictions Precautions Precautions: Back Precaution Comments: Reviewed 3/3 back precautions. Handout present in room from previous OT  session. Required Braces or Orthoses: Spinal Brace Spinal Brace: Thoracolumbosacral orthotic;Applied in sitting position      Mobility  Bed Mobility Overal bed mobility: Needs Assistance Bed Mobility: Rolling, Sidelying to Sit, Sit to Sidelying Rolling: Modified independent (Device/Increase time) Sidelying to sit: Min guard, HOB elevated     Sit to sidelying: Min guard General bed mobility comments: +rail, increased time    Transfers Overall transfer level: Needs assistance Equipment used: Rolling walker (2 wheels) Transfers: Sit to/from Stand Sit to Stand: Min guard           General transfer comment: increased time    Ambulation/Gait Ambulation/Gait assistance: Min guard Gait Distance (Feet): 100 Feet Assistive device: Rolling walker (2 wheels) Gait Pattern/deviations: Step-through pattern, Decreased stride length Gait velocity: decreased Gait velocity interpretation: <1.31 ft/sec, indicative of household ambulator   General Gait Details: cues for posture and proximity to [employer] stair comments: Pt declining need for stair training. Reports good carryover of education from recent admission.  Wheelchair Mobility    Modified Rankin (Stroke Patients Only)       Balance Overall balance assessment: Needs assistance Sitting-balance support: No upper extremity supported, Feet supported Sitting balance-Leahy Scale: Good     Standing balance support: Bilateral upper extremity supported, During functional activity, Reliant on assistive device for balance Standing balance-Leahy Scale: Poor                               Pertinent Vitals/Pain Pain Assessment Pain Assessment: 0-10 Pain Score: 7  Pain Location: back Pain Descriptors / Indicators: Discomfort, Sore, Grimacing, Guarding Pain Intervention(s): Monitored during session, Repositioned    Home Living Family/patient  expects to be discharged to:: Private  residence Living Arrangements: Alone Available Help at Discharge: Family;Available 24 hours/day (mom to stay with pt upon d/c) Type of Home: House Home Access: Stairs to enter   [employer] of Steps: 2   Home Layout: Two level;1/2 bath on main level;Able to live on main level with bedroom/bathroom Home Equipment: Gilmer Mor - single point;Shower seat;BSC/3in1;Rolling Environmental consultant (2 wheels)      Prior Function Prior Level of Function : Needs assist             Mobility Comments: pt reports using cane for gait PTA ADLs Comments: ind before recent surgeries. Upon recent DC, pt has mother assisting with sponge bathing and sleeps on couch     Hand Dominance   Dominant Hand: Right    Extremity/Trunk Assessment   Upper Extremity Assessment Upper Extremity Assessment: Defer to OT evaluation    Lower Extremity Assessment Lower Extremity Assessment: Generalized weakness    Cervical / Trunk Assessment Cervical / Trunk Assessment: Back Surgery  Communication   Communication: No difficulties  Cognition Arousal/Alertness: Awake/alert Behavior During Therapy: WFL for tasks assessed/performed Overall Cognitive Status: Within Functional Limits for tasks assessed                                          General Comments General comments (skin integrity, edema, etc.): VSS on RA    Exercises     Assessment/Plan    PT Assessment Patient does not need any further PT services  PT Problem List         PT Treatment Interventions      PT Goals (Current goals can be found in the Care Plan section)  Acute Rehab PT Goals Patient Stated Goal: home today    Frequency       Co-evaluation               AM-PAC PT "6 Clicks" Mobility  Outcome Measure Help needed turning from your back to your side while in a flat bed without using bedrails?: None Help needed moving from lying on your back to sitting on the side of a flat bed without using bedrails?: A  Little Help needed moving to and from a bed to a chair (including a wheelchair)?: A Little Help needed standing up from a chair using your arms (e.g., wheelchair or bedside chair)?: A Little Help needed to walk in hospital room?: A Little Help needed climbing 3-5 steps with a railing? : A Little 6 Click Score: 19    End of Session Equipment Utilized During Treatment: Gait belt;Back brace Activity Tolerance: Patient tolerated treatment well Patient left: in bed;with call bell/phone within reach Nurse Communication: Mobility status PT Visit Diagnosis: Other abnormalities of gait and mobility (R26.89);Muscle weakness (generalized) (M62.81);Pain    Time: 1610-9604 PT Time Calculation (min) (ACUTE ONLY): 16 min   Charges:   PT Evaluation $PT Eval Low Complexity: 1 Low          Ferd Glassing., PT  Office # [phone]   Ilda Foil 04/03/2023, 8:59 AM

## 2023-04-03 NOTE — Progress Notes (Signed)
Patient alert and oriented, void and ambulate. Surgical site clean and dry no sign of infection. D/c instructions explain and given all questions answered. Patient d/c home per order.

## 2023-04-03 NOTE — Plan of Care (Signed)

## 2023-04-03 NOTE — Progress Notes (Signed)
   04/02/23 2110  BiPAP/CPAP/SIPAP  Reason BIPAP/CPAP not in use Other(comment) (pt refused cpap. pt said does not wear cpap at home)

## 2023-04-03 NOTE — Progress Notes (Signed)
    Patient doing well  Denies leg pain Has been ambulating   Physical Exam: Vitals:   04/02/23 2318 04/03/23 0328  BP: 133/77 (!) 119/55  Pulse: 91 98  Resp: 18 18  Temp: 98.4 F (36.9 C) 97.8 F (36.6 C)  SpO2: 100% 98%   Patient looks excellent Dressing in place NVI  POD #1 s/p revision L5/S1 ALIF, doing well  - up with PT/OT, encourage ambulation - Percocet for pain, Robaxin for muscle spasms - d/c home today with f/u in 2 weeks

## 2023-04-03 NOTE — Progress Notes (Signed)
Occupational Therapy Treatment Patient Details Name: Alicia Blake MRN: [id number] DOB: 1966-10-19 Today's Date: 04/03/2023   History of present illness 57 yo female s/p ALIF L4-S1, removal of posterior instrumentation bilat L5-S1 on 5/29; posterior spinal fusion L3-S1, L4-S1 posterior lumbar decompression and placement of posterior segmental instrumentation L4-S1 on 5/30. She is now s/p revision ALIF L5-S1 04/02/23. PMH includes bipolar disorder, GERD, preDM, PTSD, seizures, substance abuse none since 2021, previous lumbar laminectomy with nonunion 2018, L THA 2017, L ankle ORIF 2022.   OT comments  Pt continuing to progress in OT sessions, demonstrated good carryover of log roll to maintain back precautions during bed mobility. Pt needing min guard for functional ambulation. Educated and trialed tub bench transfer, pt successfully completed tub transfer with tub bench at supervision level and min cues for new learning. Pt reported "this would be very helpful." Pt also demonstrated ability to don/doff back brace but needed 1 verbal cue for accuracy of straps. Pt has completed and demonstrated mastery in all OT related goals, DC from OT services at this time but reconsult if functional status changes. No follow-up OT recommended.    Recommendations for follow up therapy are one component of a multi-disciplinary discharge planning process, led by the attending physician.  Recommendations may be updated based on patient status, additional functional criteria and insurance authorization.    Assistance Recommended at Discharge Set up Supervision/Assistance  Patient can return home with the following  Assist for transportation;Help with stairs or ramp for entrance;Assistance with cooking/housework   Equipment Recommendations  Tub/shower bench    Recommendations for Other Services      Precautions / Restrictions Precautions Precautions: Back Precaution Booklet Issued: Yes (comment) Precaution  Comments: Reviewed 3/3 back precautions. Handout present in room from previous OT session. Required Braces or Orthoses: Spinal Brace Spinal Brace: Thoracolumbosacral orthotic;Applied in sitting position Restrictions Weight Bearing Restrictions: No       Mobility Bed Mobility Overal bed mobility: Needs Assistance Bed Mobility: Rolling, Sidelying to Sit Rolling: Modified independent (Device/Increase time) Sidelying to sit: Min guard, HOB elevated       General bed mobility comments: pt using bed rail to assit with mobility, demo's good log roll carryover    Transfers Overall transfer level: Needs assistance Equipment used: Rolling walker (2 wheels) Transfers: Sit to/from Stand, Bed to chair/wheelchair/BSC Sit to Stand: Min guard     Step pivot transfers: Min guard     General transfer comment: STS from EOB and STS from recliner     Balance Overall balance assessment: Needs assistance Sitting-balance support: No upper extremity supported, Feet supported Sitting balance-Leahy Scale: Good     Standing balance support: Bilateral upper extremity supported, During functional activity, Reliant on assistive device for balance Standing balance-Leahy Scale: Poor Standing balance comment: RW support                           ADL either performed or assessed with clinical judgement   ADL                                   Tub/ Shower Transfer: Supervision/safety;Ambulation;Rolling walker (2 wheels);Tub bench   Functional mobility during ADLs: Min guard;Rolling walker (2 wheels)      Extremity/Trunk Assessment Upper Extremity Assessment Upper Extremity Assessment: Defer to OT evaluation   Lower Extremity Assessment Lower Extremity Assessment: Generalized weakness   Cervical /  Trunk Assessment Cervical / Trunk Assessment: Back Surgery    Vision       Perception     Praxis      Cognition Arousal/Alertness: Awake/alert Behavior During  Therapy: WFL for tasks assessed/performed Overall Cognitive Status: Within Functional Limits for tasks assessed                                          Exercises      Shoulder Instructions       General Comments VSS on RA    Pertinent Vitals/ Pain       Pain Assessment Pain Assessment: No/denies pain Pain Score: 5  Pain Location: back Pain Descriptors / Indicators: Discomfort, Sore, Grimacing, Guarding Pain Intervention(s): Monitored during session, Repositioned  Home Living Family/patient expects to be discharged to:: Private residence Living Arrangements: Alone Available Help at Discharge: Family;Available 24 hours/day (mom to stay with pt upon d/c) Type of Home: House Home Access: Stairs to enter [employer] of Steps: 2   Home Layout: Two level;1/2 bath on main level;Able to live on main level with bedroom/bathroom     Bathroom Shower/Tub: Chief Strategy Officer: Standard     Home Equipment: Cane - single point;Shower Scientist, physiological (2 wheels)          Prior Functioning/Environment              Frequency  Min 2X/week        Progress Toward Goals  OT Goals(current goals can now be found in the care plan section)  Progress towards OT goals: Progressing toward goals  Acute Rehab OT Goals OT Goal Formulation: With patient Time For Goal Achievement: 04/16/23 Potential to Achieve Goals: Good ADL Goals Pt Will Perform Grooming: with supervision;standing Pt Will Perform Tub/Shower Transfer: with supervision;tub bench  Plan All goals met and education completed, patient discharged from OT services    Co-evaluation                 AM-PAC OT "6 Clicks" Daily Activity     Outcome Measure   Help from another person eating meals?: None Help from another person taking care of personal grooming?: A Little Help from another person toileting, which includes using toliet, bedpan, or urinal?: A  Little Help from another person bathing (including washing, rinsing, drying)?: A Little Help from another person to put on and taking off regular upper body clothing?: None Help from another person to put on and taking off regular lower body clothing?: A Little 6 Click Score: 20    End of Session Equipment Utilized During Treatment: Rolling walker (2 wheels);Gait belt;Back brace  OT Visit Diagnosis: Unsteadiness on feet (R26.81);Other abnormalities of gait and mobility (R26.89);Pain   Activity Tolerance Patient tolerated treatment well   Patient Left with call bell/phone within reach;in chair   Nurse Communication Mobility status        Time: 4098-1191 OT Time Calculation (min): 26 min  Charges: OT General Charges $OT Visit: 1 Visit OT Treatments $Therapeutic Activity: 23-37 mins  04/03/2023  AB, OTR/L  Acute Rehabilitation Services  Office: [phone]   Tristan Schroeder 04/03/2023, 9:25 AM

## 2023-04-03 NOTE — Progress Notes (Addendum)
  Progress Note    04/03/2023 6:38 AM 1 Day Post-Op  Subjective:  says she's a little sore  afebrile  Vitals:   04/02/23 2318 04/03/23 0328  BP: 133/77 (!) 119/55  Pulse: 91 98  Resp: 18 18  Temp: 98.4 F (36.9 C) 97.8 F (36.6 C)  SpO2: 100% 98%    Physical Exam: General:  no distress Lungs:  non labored Incisions:  abdominal incision dressing is clean without drainage Extremities:  palpable DP pulses bilaterally Abdomen:  soft  CBC    Component Value Date/Time   WBC 12.9 (H) 04/01/2023 0825   RBC 3.07 (L) 04/01/2023 0825   HGB 8.9 (L) 04/01/2023 0825   HCT 27.6 (L) 04/01/2023 0825   PLT 286 04/01/2023 0825   MCV 89.9 04/01/2023 0825   MCH 29.0 04/01/2023 0825   MCHC 32.2 04/01/2023 0825   RDW 15.2 04/01/2023 0825   LYMPHSABS 2.0 04/01/2023 0825   MONOABS 0.6 04/01/2023 0825   EOSABS 0.1 04/01/2023 0825   BASOSABS 0.0 04/01/2023 0825    BMET    Component Value Date/Time   NA 136 04/01/2023 0825   K 4.1 04/01/2023 0825   CL 99 04/01/2023 0825   CO2 29 04/01/2023 0825   GLUCOSE 122 (H) 04/01/2023 0825   BUN 9 04/01/2023 0825   CREATININE 0.83 04/01/2023 0825   CREATININE 0.87 03/15/2021 1503   CALCIUM 8.9 04/01/2023 0825   GFRNONAA >60 04/01/2023 0825   GFRNONAA 75 03/15/2021 1503   GFRAA 87 03/15/2021 1503    INR    Component Value Date/Time   INR 1.01 05/07/2016 1111     Intake/Output Summary (Last 24 hours) at 04/03/2023 9629 Last data filed at 04/02/2023 1354 Gross per 24 hour  Intake 1490 ml  Output 100 ml  Net 1390 ml      Assessment/Plan:  57 y.o. female is s/p:  Re-do anterior spine exposure for revision of L5-S1 ALIF via anterior retroperitoneal approach   1 Day Post-Op   -pt doing well this morning.   -bandage in place and is clean -palpable DP pulses bilaterally -doing well from vascular standpoint.  Follow up with Dr. Chestine Spore as needed.    Doreatha Massed, PA-C [facility] [phone] 04/03/2023 6:38  AM  I have seen and evaluated the patient. I agree with the PA note as documented above.  Postop day 1 status post redo anterior spine exposure for revision of L5-S1 hardware from ALIF last week.  Abdomen is soft.  Palpable DP pulse.  Looks good from our standpoint.  Cephus Shelling, MD [facility] [place] Office: [phone]

## 2023-04-06 NOTE — Discharge Summary (Signed)
Patient ID: Alicia Blake MRN: [id number] DOB/AGE: 1966/10/12 57 y.o.  Admit date: 03/26/2023 Discharge date: 03/28/2023  Admission Diagnoses:  Principal Problem:   Radiculopathy, lumbar region   Discharge Diagnoses:  Same  Past Medical History:  Diagnosis Date   Asthma    Bipolar affect, depressed (HCC)    Bronchitis    Chronic back pain    Closed left ankle fracture    COPD (chronic obstructive pulmonary disease) (HCC)    DJD (degenerative joint disease)    BACK   GERD (gastroesophageal reflux disease)    Hypertension    Pneumonia    Pre-diabetes    dr entered diabetes without complications- no meds does not test sugar at home   PTSD (post-traumatic stress disorder)    Scoliosis    Seizures (HCC)    "when drinking" last yrs ago   Shortness of breath dyspnea    exersion   Substance abuse (HCC)    crack cocaine, 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 female who was admitted 03/26/2023 for operative treatment of Radiculopathy, lumbar region. Patient has severe unremitting pain that affects sleep, daily activities, and work/hobbies. After pre-op clearance the patient was taken to the operating room on 03/27/2023 and underwent  Procedure(s): POSTERIOR DECOMPRESSION AND FUSION LUMBAR 4- LUMBAR 5, LUMBAR 5- SACRUM 1 WITH INSTRUMENTATION AND ALLOGRAFT.    Patient was given perioperative antibiotics:  Anti-infectives (From admission, onward)    Start     Dose/Rate Route Frequency Ordered Stop   03/27/23 0600  ceFAZolin (ANCEF) IVPB 2g/100 mL premix        2 g 200 mL/hr over 30 Minutes Intravenous On call to O.R. 03/26/23  2100 03/27/23 0820   03/26/23 2100  vancomycin (VANCOCIN) IVPB 1000 mg/200 mL premix        1,000 mg 200 mL/hr over 60 Minutes Intravenous  Once 03/26/23 1511 03/26/23 2129   03/26/23 0915  vancomycin (VANCOCIN) IVPB 1000 mg/200 mL premix  Status:  Discontinued        1,000 mg 200 mL/hr over 60 Minutes Intravenous  Once 03/26/23 0907 03/26/23 1449   03/26/23 0913  vancomycin (VANCOCIN) 1-5 GM/200ML-% IVPB       Note to Pharmacy: Cyndra Numbers (: cabinet override      03/26/23 0913 03/26/23 2129   03/26/23 0913  vancomycin (VANCOCIN) 1-5 GM/200ML-% IVPB       Note to Pharmacy: Lynetta Mare M: cabinet override      03/26/23 0913 03/26/23 2129   03/26/23 0600  ceFAZolin (ANCEF) IVPB 2g/100 mL premix        2 g 200 mL/hr over 30 Minutes Intravenous On call to O.R. 03/26/23 0548 03/26/23 1230        Patient was given sequential compression devices, early ambulation to prevent DVT.  Patient benefited maximally from hospital stay and there were no complications.    Recent vital signs: BP (!) 103/48 (BP Location: Left Arm)   Pulse (!) 110   Temp 98.1 F (36.7 C) (Oral)   Resp 20   Ht 5\' 6"  (1.676 m)   Wt 106.1 kg   LMP 12/09/2015 Comment: very  irregular  SpO2 98%   BMI 37.77 kg/m    Discharge Medications:   Allergies as of 03/28/2023       Reactions   Risperidone And Related Swelling, Other (See Comments)   Facial swelling        Medication List     TAKE these medications    Abilify Maintena 400 MG Prsy prefilled syringe Generic drug: ARIPiprazole ER Inject 400 mg into the muscle every 28 (twenty-eight) days.   acetaminophen 325 MG tablet Commonly known as: TYLENOL Take 650 mg by mouth every 6 (six) hours as needed for moderate pain.   albuterol (2.5 MG/3ML) 0.083% nebulizer solution Commonly known as: PROVENTIL Take 3 mLs (2.5 mg total) by nebulization every 6 (six) hours as needed for wheezing or shortness of breath.   albuterol 108 (90 Base) MCG/ACT  inhaler Commonly known as: VENTOLIN HFA Inhale 2 puffs into the lungs every 6 (six) hours as needed for wheezing.   aspirin EC 81 MG tablet Take 1 tablet (81 mg total) by mouth 2 (two) times daily. For DVT prophylaxis for 30 days after surgery.   benztropine 0.5 MG tablet Commonly known as: COGENTIN Take 0.5 mg by mouth 2 (two) times daily.   budesonide-formoterol 160-4.5 MCG/ACT inhaler Commonly known as: Symbicort Inhale 2 puffs into the lungs in the morning and at bedtime.   cetirizine 10 MG tablet Commonly known as: ZYRTEC Take 10 mg by mouth daily.   cyclobenzaprine 5 MG tablet Commonly known as: FLEXERIL Take 5-10 mg by mouth at bedtime as needed for muscle spasms.   esomeprazole 40 MG capsule Commonly known as: NEXIUM Take 40 mg by mouth daily.   fluticasone 50 MCG/ACT nasal spray Commonly known as: FLONASE Place 2 sprays into both nostrils daily as needed for allergies.   furosemide 40 MG tablet Commonly known as: LASIX Take 40 mg by mouth 2 (two) times daily.   hydrOXYzine 25 MG capsule Commonly known as: VISTARIL Take 25 mg by mouth daily.   lithium 300 MG tablet Take 300 mg by mouth 2 (two) times daily.   losartan 25 MG tablet Commonly known as: COZAAR Take 25 mg by mouth daily.   methocarbamol 500 MG tablet Commonly known as: ROBAXIN Take 1 tablet (500 mg total) by mouth every 6 (six) hours as needed for muscle spasms.   montelukast 10 MG tablet Commonly known as: SINGULAIR Take 10 mg by mouth daily.   oxybutynin 10 MG 24 hr tablet Commonly known as: DITROPAN-XL Take 10 mg by mouth daily.   oxyCODONE-acetaminophen 5-325 MG tablet Commonly known as: PERCOCET/ROXICET Take 1-2 tablets by mouth every 4 (four) hours as needed for severe pain.   PARoxetine 25 MG 24 hr tablet Commonly known as: PAXIL-CR Take 25 mg by mouth daily.   potassium chloride 10 MEQ tablet Commonly known as: KLOR-CON Take 10 mEq by mouth daily.   QUEtiapine 200 MG  tablet Commonly known as: SEROQUEL Take 300 mg by mouth at bedtime.   tiZANidine 4 MG tablet Commonly known as: ZANAFLEX Take 4 mg by mouth 2 (two) times daily as needed for muscle spasms.        Diagnostic Studies: DG Lumbar Spine 1 View  Result Date: 04/02/2023 CLINICAL DATA:  Elective surgery. EXAM: LUMBAR SPINE - 1 VIEW COMPARISON:  Radiograph 03/28/2023 FINDINGS: Single lateral fluoroscopic spot view of the lumbar spine obtained in the operating room. Posterior fusion hardware is partially included in the field of view. Fluoroscopy time 55 seconds. Dose  90.50 mGy IMPRESSION: Single lateral fluoroscopic spot view of the lumbar spine. Electronically Signed   By: Narda Rutherford M.D.   On: 04/02/2023 11:57   DG OR LOCAL ABDOMEN  Result Date: 04/02/2023 CLINICAL DATA:  Evaluate for retained foreign body EXAM: OR LOCAL ABDOMEN COMPARISON:  03/28/2023 FINDINGS: Again seen are postsurgical changes from L4 through S1 posterior hardware fixation and interbody fusion. Several tiny surgical clips are noted in the projection of the sacrum. Left hip arthroplasty device is again seen. No retained foreign bodies identified. IMPRESSION: No retained foreign bodies identified. Postsurgical changes of the lower lumbar spine. Electronically Signed   By: Signa Kell M.D.   On: 04/02/2023 11:32   DG C-Arm 1-60 Min-No Report  Result Date: 04/02/2023 Fluoroscopy was utilized by the requesting physician.  No radiographic interpretation.   DG Lumbar Spine 2-3 Views  Result Date: 03/28/2023 CLINICAL DATA:  Postop check. EXAM: LUMBAR SPINE - 2-3 VIEW COMPARISON:  CT of the lumbar spine Mar 27, 2023. FINDINGS: L4-S1 ALIF with posterior spinal fusion. No evidence of hardware complication. Vertebral body heights are maintained. No evidence of substantial sagittal subluxation. Degenerative changes better characterized on recent CT lumbar spine. Partially imaged left hip prosthesis. IMPRESSION: L4-S1 ALIF with  posterior spinal fusion. No evidence of hardware complication. Electronically Signed   By: Feliberto Harts M.D.   On: 03/28/2023 10:11   CT LUMBAR SPINE WO CONTRAST  Result Date: 03/27/2023 CLINICAL DATA:  Low back pain, recent surgery. EXAM: CT LUMBAR SPINE WITHOUT CONTRAST TECHNIQUE: Multidetector CT imaging of the lumbar spine was performed without intravenous contrast administration. Multiplanar CT image reconstructions were also generated. RADIATION DOSE REDUCTION: This exam was performed according to the departmental dose-optimization program which includes automated exposure control, adjustment of the mA and/or kV according to patient size and/or use of iterative reconstruction technique. COMPARISON:  Lumbar CT myelogram 09/24/2022. FINDINGS: Segmentation: Conventional numbering is assumed with 5 non-rib-bearing, lumbar type vertebral bodies. Alignment: Normal. Vertebrae: Postoperative changes of L4-S1 ALIF and posterior spinal fusion. Hardware is intact. No associated lucency or fracture. No evidence of epidural hematoma in the spinal canal. Paraspinal and other soft tissues: Expected postoperative edema, fat stranding, and few foci of gas in the left retroperitoneal soft tissues. Disc levels: Unchanged small central disc-osteophyte complex at L2-3. Unchanged right subarticular/foraminal disc protrusion at L4-5, resulting in moderate narrowing of the right lateral recess and right neural foramen. IMPRESSION: 1. Postoperative changes of L4-S1 ALIF and posterior spinal fusion without evidence of hardware complication. 2. Unchanged right subarticular/foraminal disc protrusion at L4-5, resulting in moderate narrowing of the right lateral recess and right neural foramen. Electronically Signed   By: Orvan Falconer M.D.   On: 03/27/2023 16:49   DG Lumbar Spine 2-3 Views  Result Date: 03/27/2023 CLINICAL DATA:  Elective surgery. EXAM: LUMBAR SPINE - 2-3 VIEW COMPARISON:  Lumbar radiograph 10/01/2022  FINDINGS: Two fluoroscopic spot views of the lumbar spine obtained in the operating room. The previous L5-S1 fusion hardware has been extended to L4-L5. There are interbody spacers in place. Fluoroscopy time 389.95 seconds. Dose 629.78 mGy. IMPRESSION: Intraoperative fluoroscopy during lumbar fusion. Electronically Signed   By: Narda Rutherford M.D.   On: 03/27/2023 11:25   DG C-Arm 1-60 Min-No Report  Result Date: 03/27/2023 Fluoroscopy was utilized by the requesting physician.  No radiographic interpretation.   DG C-Arm 1-60 Min-No Report  Result Date: 03/27/2023 Fluoroscopy was utilized by the requesting physician.  No radiographic interpretation.   DG Lumbar Spine 1  View  Result Date: 03/26/2023 CLINICAL DATA:  Elective surgery. EXAM: LUMBAR SPINE - 1 VIEW COMPARISON:  None Available. FINDINGS: Single lateral fluoroscopic spot view of the lumbar spine obtained in the operating room. Interbody spacers are in place at L4-L5 and L5-S1 assuming lower most lumbar vertebra is L5. Fluoroscopy time 1 minute 11 seconds. Fluoroscopy dose 97.41 mGy. IMPRESSION: Intraoperative fluoroscopy during lumbar surgery. Electronically Signed   By: Narda Rutherford M.D.   On: 03/26/2023 18:40   DG OR LOCAL ABDOMEN  Addendum Date: 03/26/2023   ADDENDUM REPORT: 03/26/2023 12:52 ADDENDUM: Findings were reported to the operating room at the time of the report. Findings were communicated to [facility] Electronically Signed   By: Marin Roberts M.D.   On: 03/26/2023 12:52   Result Date: 03/26/2023 CLINICAL DATA:  Anterior approach for interbody fusion at L4-5 and L5-S1. EXAM: OR LOCAL ABDOMEN COMPARISON:  CT of the abdomen and pelvis 12/08/2022 FINDINGS: Disc spacers are present at L4-5 and L5-S1. Surgical clips project over the sacral promontory. No other radiopaque foreign body is present at the operative site. Left total hip arthroplasty is incompletely imaged. IMPRESSION: Disc spacers at L4-5 and L5-S1.  Electronically Signed: By: Marin Roberts M.D. On: 03/26/2023 12:40   DG C-Arm 1-60 Min-No Report  Result Date: 03/26/2023 Fluoroscopy was utilized by the requesting physician.  No radiographic interpretation.   DG C-Arm 1-60 Min-No Report  Result Date: 03/26/2023 Fluoroscopy was utilized by the requesting physician.  No radiographic interpretation.   DG C-Arm 1-60 Min-No Report  Result Date: 03/26/2023 Fluoroscopy was utilized by the requesting physician.  No radiographic interpretation.   DG C-Arm 1-60 Min-No Report  Result Date: 03/26/2023 Fluoroscopy was utilized by the requesting physician.  No radiographic interpretation.    Disposition: Discharge disposition: 01-Home or Self Care        Pt s/p L4-S1 fusion, doing well   - up with PT/OT, encourage ambulation - Percocet for pain, Robaxin for muscle spasms -Scripts for pain sent to pharmacy electronically  -D/C instructions sheet printed and in chart -D/C today  -F/U in office 2 weeks   Signed: Georga Bora 04/06/2023, 9:48 AM

## 2023-04-06 NOTE — Discharge Summary (Signed)
Patient ID: Alicia Blake MRN: [id number] DOB/AGE: Feb 14, 1966 57 y.o.  Admit date: 04/01/2023 Discharge date: 04/03/2023  Admission Diagnoses:  Principal Problem:   Radiculopathy, lumbar region Migration of implant  Discharge Diagnoses:  Same  Past Medical History:  Diagnosis Date   Asthma    Bipolar affect, depressed (HCC)    Bronchitis    Chronic back pain    Closed left ankle fracture    COPD (chronic obstructive pulmonary disease) (HCC)    DJD (degenerative joint disease)    BACK   GERD (gastroesophageal reflux disease)    Hypertension    Pneumonia    Pre-diabetes    dr entered diabetes without complications- no meds does not test sugar at home   PTSD (post-traumatic stress disorder)    Scoliosis    Seizures (HCC)    "when drinking" last yrs ago   Shortness of breath dyspnea    exersion   Substance abuse (HCC)    crack cocaine, 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 female who was admitted 04/01/2023 for operative treatment of migration of implant. Patient has severe unremitting pain that affects sleep, daily activities, and work/hobbies. After pre-op clearance the patient was taken to the operating room on 04/02/2023 and underwent  Procedure(s): REVISION ANTERIOR LUMBAR FUSION L5-S1 ABDOMINAL EXPOSURE.    Patient was given perioperative antibiotics:  Anti-infectives (From admission, onward)    Start     Dose/Rate Route Frequency Ordered Stop   04/02/23 0900  vancomycin (VANCOCIN) powder 1,000 mg  Status:  Discontinued        1,000 mg Other To Surgery 04/02/23 0845 04/02/23 0911   04/02/23 0820  ceFAZolin (ANCEF) 2-4 GM/100ML-% IVPB       Note to Pharmacy: Cyndra Numbers (: cabinet override      04/02/23 0820 04/02/23 2029        Patient was given sequential compression devices, early ambulation to prevent DVT.  Patient benefited maximally from hospital stay and there were no complications.    Recent vital signs: BP (!) 141/69 (BP Location: Right Arm)   Pulse (!) 105   Temp 98.5 F (36.9 C)   Resp 17   Ht 5\' 6"  (1.676 m)   Wt 106.1 kg   LMP 12/09/2015 Comment: very irregular  SpO2 100%   BMI 37.77 kg/m    Discharge Medications:   Allergies as of 04/03/2023       Reactions   Risperidone And Related Swelling, Other (See Comments)   Facial swelling        Medication List     TAKE these medications    Abilify Maintena 400 MG Prsy prefilled syringe Generic drug: ARIPiprazole ER Inject 400 mg into the muscle every 28 (twenty-eight) days.   acetaminophen 325 MG tablet Commonly known as: TYLENOL Take 650 mg by mouth every 6 (six) hours as needed for moderate pain.   albuterol (2.5 MG/3ML) 0.083% nebulizer solution Commonly known as: PROVENTIL Take 3 mLs (2.5 mg total) by nebulization every 6 (six) hours as needed for wheezing or shortness of breath.   albuterol 108 (90 Base) MCG/ACT inhaler Commonly known as: VENTOLIN HFA Inhale 2 puffs into the lungs every 6 (six)  hours as needed for wheezing.   aspirin EC 81 MG tablet Take 1 tablet (81 mg total) by mouth 2 (two) times daily. For DVT prophylaxis for 30 days after surgery.   benztropine 0.5 MG tablet Commonly known as: COGENTIN Take 0.5 mg by mouth 2 (two) times daily.   budesonide-formoterol 160-4.5 MCG/ACT inhaler Commonly known as: Symbicort Inhale 2 puffs into the lungs in the morning and at bedtime.   cetirizine 10 MG tablet Commonly known as: ZYRTEC Take 10 mg by mouth daily.   cyclobenzaprine 5 MG tablet Commonly known as: FLEXERIL Take 5-10 mg by mouth at bedtime as needed for muscle spasms.   esomeprazole 40 MG capsule Commonly known as: NEXIUM Take  40 mg by mouth daily.   fluticasone 50 MCG/ACT nasal spray Commonly known as: FLONASE Place 2 sprays into both nostrils daily as needed for allergies.   furosemide 40 MG tablet Commonly known as: LASIX Take 40 mg by mouth 2 (two) times daily.   hydrOXYzine 25 MG capsule Commonly known as: VISTARIL Take 25 mg by mouth daily.   lithium 300 MG tablet Take 300 mg by mouth 2 (two) times daily.   losartan 25 MG tablet Commonly known as: COZAAR Take 25 mg by mouth daily.   methocarbamol 500 MG tablet Commonly known as: ROBAXIN Take 1 tablet (500 mg total) by mouth every 6 (six) hours as needed for muscle spasms. What changed: Another medication with the same name was added. Make sure you understand how and when to take each.   methocarbamol 500 MG tablet Commonly known as: ROBAXIN Take 1 tablet (500 mg total) by mouth every 6 (six) hours as needed for muscle spasms. What changed: You were already taking a medication with the same name, and this prescription was added. Make sure you understand how and when to take each.   montelukast 10 MG tablet Commonly known as: SINGULAIR Take 10 mg by mouth daily.   oxybutynin 10 MG 24 hr tablet Commonly known as: DITROPAN-XL Take 10 mg by mouth daily.   oxyCODONE-acetaminophen 5-325 MG tablet Commonly known as: PERCOCET/ROXICET Take 1-2 tablets by mouth every 4 (four) hours as needed for severe pain.   PARoxetine 25 MG 24 hr tablet Commonly known as: PAXIL-CR Take 25 mg by mouth daily.   potassium chloride 10 MEQ tablet Commonly known as: KLOR-CON Take 10 mEq by mouth daily.   QUEtiapine 200 MG tablet Commonly known as: SEROQUEL Take 300 mg by mouth at bedtime.   tiZANidine 4 MG tablet Commonly known as: ZANAFLEX Take 4 mg by mouth 2 (two) times daily as needed for muscle spasms.        Diagnostic Studies: DG Lumbar Spine 1 View  Result Date: 04/02/2023 CLINICAL DATA:  Elective surgery. EXAM: LUMBAR SPINE - 1 VIEW  COMPARISON:  Radiograph 03/28/2023 FINDINGS: Single lateral fluoroscopic spot view of the lumbar spine obtained in the operating room. Posterior fusion hardware is partially included in the field of view. Fluoroscopy time 55 seconds. Dose 90.50 mGy IMPRESSION: Single lateral fluoroscopic spot view of the lumbar spine. Electronically Signed   By: Narda Rutherford M.D.   On: 04/02/2023 11:57   DG OR LOCAL ABDOMEN  Result Date: 04/02/2023 CLINICAL DATA:  Evaluate for retained foreign body EXAM: OR LOCAL ABDOMEN COMPARISON:  03/28/2023 FINDINGS: Again seen are postsurgical changes from L4 through S1 posterior hardware fixation and interbody fusion. Several tiny surgical clips are noted in the projection of the sacrum. Left hip arthroplasty device is  again seen. No retained foreign bodies identified. IMPRESSION: No retained foreign bodies identified. Postsurgical changes of the lower lumbar spine. Electronically Signed   By: Signa Kell M.D.   On: 04/02/2023 11:32   DG C-Arm 1-60 Min-No Report  Result Date: 04/02/2023 Fluoroscopy was utilized by the requesting physician.  No radiographic interpretation.   DG Lumbar Spine 2-3 Views  Result Date: 03/28/2023 CLINICAL DATA:  Postop check. EXAM: LUMBAR SPINE - 2-3 VIEW COMPARISON:  CT of the lumbar spine Mar 27, 2023. FINDINGS: L4-S1 ALIF with posterior spinal fusion. No evidence of hardware complication. Vertebral body heights are maintained. No evidence of substantial sagittal subluxation. Degenerative changes better characterized on recent CT lumbar spine. Partially imaged left hip prosthesis. IMPRESSION: L4-S1 ALIF with posterior spinal fusion. No evidence of hardware complication. Electronically Signed   By: Feliberto Harts M.D.   On: 03/28/2023 10:11   CT LUMBAR SPINE WO CONTRAST  Result Date: 03/27/2023 CLINICAL DATA:  Low back pain, recent surgery. EXAM: CT LUMBAR SPINE WITHOUT CONTRAST TECHNIQUE: Multidetector CT imaging of the lumbar spine was  performed without intravenous contrast administration. Multiplanar CT image reconstructions were also generated. RADIATION DOSE REDUCTION: This exam was performed according to the departmental dose-optimization program which includes automated exposure control, adjustment of the mA and/or kV according to patient size and/or use of iterative reconstruction technique. COMPARISON:  Lumbar CT myelogram 09/24/2022. FINDINGS: Segmentation: Conventional numbering is assumed with 5 non-rib-bearing, lumbar type vertebral bodies. Alignment: Normal. Vertebrae: Postoperative changes of L4-S1 ALIF and posterior spinal fusion. Hardware is intact. No associated lucency or fracture. No evidence of epidural hematoma in the spinal canal. Paraspinal and other soft tissues: Expected postoperative edema, fat stranding, and few foci of gas in the left retroperitoneal soft tissues. Disc levels: Unchanged small central disc-osteophyte complex at L2-3. Unchanged right subarticular/foraminal disc protrusion at L4-5, resulting in moderate narrowing of the right lateral recess and right neural foramen. IMPRESSION: 1. Postoperative changes of L4-S1 ALIF and posterior spinal fusion without evidence of hardware complication. 2. Unchanged right subarticular/foraminal disc protrusion at L4-5, resulting in moderate narrowing of the right lateral recess and right neural foramen. Electronically Signed   By: Orvan Falconer M.D.   On: 03/27/2023 16:49   DG Lumbar Spine 2-3 Views  Result Date: 03/27/2023 CLINICAL DATA:  Elective surgery. EXAM: LUMBAR SPINE - 2-3 VIEW COMPARISON:  Lumbar radiograph 10/01/2022 FINDINGS: Two fluoroscopic spot views of the lumbar spine obtained in the operating room. The previous L5-S1 fusion hardware has been extended to L4-L5. There are interbody spacers in place. Fluoroscopy time 389.95 seconds. Dose 629.78 mGy. IMPRESSION: Intraoperative fluoroscopy during lumbar fusion. Electronically Signed   By: Narda Rutherford  M.D.   On: 03/27/2023 11:25   DG C-Arm 1-60 Min-No Report  Result Date: 03/27/2023 Fluoroscopy was utilized by the requesting physician.  No radiographic interpretation.   DG C-Arm 1-60 Min-No Report  Result Date: 03/27/2023 Fluoroscopy was utilized by the requesting physician.  No radiographic interpretation.   DG Lumbar Spine 1 View  Result Date: 03/26/2023 CLINICAL DATA:  Elective surgery. EXAM: LUMBAR SPINE - 1 VIEW COMPARISON:  None Available. FINDINGS: Single lateral fluoroscopic spot view of the lumbar spine obtained in the operating room. Interbody spacers are in place at L4-L5 and L5-S1 assuming lower most lumbar vertebra is L5. Fluoroscopy time 1 minute 11 seconds. Fluoroscopy dose 97.41 mGy. IMPRESSION: Intraoperative fluoroscopy during lumbar surgery. Electronically Signed   By: Narda Rutherford M.D.   On: 03/26/2023 18:40   DG  OR LOCAL ABDOMEN  Addendum Date: 03/26/2023   ADDENDUM REPORT: 03/26/2023 12:52 ADDENDUM: Findings were reported to the operating room at the time of the report. Findings were communicated to [facility]. Electronically Signed   By: Marin Roberts M.D.   On: 03/26/2023 12:52   Result Date: 03/26/2023 CLINICAL DATA:  Anterior approach for interbody fusion at L4-5 and L5-S1. EXAM: OR LOCAL ABDOMEN COMPARISON:  CT of the abdomen and pelvis 12/08/2022 FINDINGS: Disc spacers are present at L4-5 and L5-S1. Surgical clips project over the sacral promontory. No other radiopaque foreign body is present at the operative site. Left total hip arthroplasty is incompletely imaged. IMPRESSION: Disc spacers at L4-5 and L5-S1. Electronically Signed: By: Marin Roberts M.D. On: 03/26/2023 12:40   DG C-Arm 1-60 Min-No Report  Result Date: 03/26/2023 Fluoroscopy was utilized by the requesting physician.  No radiographic interpretation.   DG C-Arm 1-60 Min-No Report  Result Date: 03/26/2023 Fluoroscopy was utilized by the requesting physician.  No radiographic  interpretation.   DG C-Arm 1-60 Min-No Report  Result Date: 03/26/2023 Fluoroscopy was utilized by the requesting physician.  No radiographic interpretation.   DG C-Arm 1-60 Min-No Report  Result Date: 03/26/2023 Fluoroscopy was utilized by the requesting physician.  No radiographic interpretation.    Disposition: Discharge disposition: 01-Home or Self Care        POD #1 s/p revision L5/S1 ALIF, doing well   - up with PT/OT, encourage ambulation - Percocet for pain, Robaxin for muscle spasms -Scripts for pain sent to pharmacy electronically  -D/C instructions sheet printed and in chart -D/C today  -F/U in office 2 weeks   Signed: Eilene Ghazi Haasini Patnaude 04/06/2023, 9:51 AM

## 2023-04-10 ENCOUNTER — Encounter (HOSPITAL_COMMUNITY): Payer: Self-pay | Admitting: Orthopedic Surgery

## 2023-04-15 MED FILL — Heparin Sodium (Porcine) Inj 1000 Unit/ML: INTRAMUSCULAR | Qty: 30 | Status: AC

## 2023-04-15 MED FILL — Sodium Chloride IV Soln 0.9%: INTRAVENOUS | Qty: 1000 | Status: AC

## 2023-04-21 DIAGNOSIS — Z09 Encounter for follow-up examination after completed treatment for conditions other than malignant neoplasm: Secondary | ICD-10-CM | POA: Diagnosis not present

## 2023-04-21 DIAGNOSIS — M5117 Intervertebral disc disorders with radiculopathy, lumbosacral region: Secondary | ICD-10-CM | POA: Diagnosis not present

## 2023-04-24 DIAGNOSIS — Z981 Arthrodesis status: Secondary | ICD-10-CM | POA: Diagnosis not present

## 2023-05-05 DIAGNOSIS — F411 Generalized anxiety disorder: Secondary | ICD-10-CM | POA: Diagnosis not present

## 2023-05-05 DIAGNOSIS — F4310 Post-traumatic stress disorder, unspecified: Secondary | ICD-10-CM | POA: Diagnosis not present

## 2023-05-05 DIAGNOSIS — F3162 Bipolar disorder, current episode mixed, moderate: Secondary | ICD-10-CM | POA: Diagnosis not present

## 2023-05-12 DIAGNOSIS — R32 Unspecified urinary incontinence: Secondary | ICD-10-CM | POA: Diagnosis not present

## 2023-05-12 DIAGNOSIS — M5117 Intervertebral disc disorders with radiculopathy, lumbosacral region: Secondary | ICD-10-CM | POA: Diagnosis not present

## 2023-05-12 DIAGNOSIS — N3289 Other specified disorders of bladder: Secondary | ICD-10-CM | POA: Diagnosis not present

## 2023-05-21 ENCOUNTER — Other Ambulatory Visit: Payer: Self-pay | Admitting: Internal Medicine

## 2023-05-21 DIAGNOSIS — Z113 Encounter for screening for infections with a predominantly sexual mode of transmission: Secondary | ICD-10-CM | POA: Diagnosis not present

## 2023-06-04 DIAGNOSIS — F3162 Bipolar disorder, current episode mixed, moderate: Secondary | ICD-10-CM | POA: Diagnosis not present

## 2023-06-04 DIAGNOSIS — F4310 Post-traumatic stress disorder, unspecified: Secondary | ICD-10-CM | POA: Diagnosis not present

## 2023-06-04 DIAGNOSIS — F411 Generalized anxiety disorder: Secondary | ICD-10-CM | POA: Diagnosis not present

## 2023-06-09 DIAGNOSIS — M545 Low back pain, unspecified: Secondary | ICD-10-CM | POA: Diagnosis not present

## 2024-02-17 ENCOUNTER — Other Ambulatory Visit: Payer: Self-pay

## 2024-02-17 ENCOUNTER — Emergency Department (HOSPITAL_BASED_OUTPATIENT_CLINIC_OR_DEPARTMENT_OTHER)
Admission: EM | Admit: 2024-02-17 | Discharge: 2024-02-17 | Disposition: A | Discharge status: Home / Self Care | Payer: MEDICAID | Attending: Emergency Medicine | Admitting: Emergency Medicine

## 2024-02-17 ENCOUNTER — Encounter (HOSPITAL_BASED_OUTPATIENT_CLINIC_OR_DEPARTMENT_OTHER): Payer: Self-pay | Admitting: Emergency Medicine

## 2024-02-17 DIAGNOSIS — K047 Periapical abscess without sinus: Secondary | ICD-10-CM | POA: Insufficient documentation

## 2024-02-17 DIAGNOSIS — K1379 Other lesions of oral mucosa: Secondary | ICD-10-CM | POA: Diagnosis present

## 2024-02-17 DIAGNOSIS — Z7982 Long term (current) use of aspirin: Secondary | ICD-10-CM | POA: Diagnosis not present

## 2024-02-17 DIAGNOSIS — Z79899 Other long term (current) drug therapy: Secondary | ICD-10-CM | POA: Insufficient documentation

## 2024-02-17 MED ORDER — AMOXICILLIN 500 MG PO CAPS
1000.0000 mg | ORAL_CAPSULE | Freq: Once | ORAL | Status: AC
  Administered 2024-02-17: 1000 mg via ORAL
  Filled 2024-02-17: qty 2

## 2024-02-17 MED ORDER — AMOXICILLIN 500 MG PO CAPS: 1000.0000 mg | ORAL_CAPSULE | Freq: Two times a day (BID) | ORAL | 0 refills | Status: DC

## 2024-02-17 MED ORDER — HYDROCODONE-ACETAMINOPHEN 5-325 MG PO TABS: 1.0000 | ORAL_TABLET | ORAL | 0 refills | Status: DC | PRN

## 2024-02-17 MED ORDER — HYDROCODONE-ACETAMINOPHEN 5-325 MG PO TABS
1.0000 | ORAL_TABLET | Freq: Once | ORAL | Status: AC
  Administered 2024-02-17: 1 via ORAL
  Filled 2024-02-17: qty 1

## 2024-02-17 NOTE — ED Notes (Signed)
 Reviewed AVS/discharge instruction with patient. Time allotted for and all questions answered. Patient is agreeable for d/c and escorted to ed exit by staff.

## 2024-02-17 NOTE — ED Provider Notes (Signed)
 [place] EMERGENCY DEPARTMENT AT [facility] Provider Note   CSN: [id number] Arrival date & time: 02/17/24  1816     History {Add pertinent medical, surgical, social history, OB history to HPI:1} Chief Complaint  Patient presents with   Mouth Lesions    Alicia Blake is a 58 y.o. female.  The history is provided by the patient. No language interpreter was used.  Mouth Lesions      Home Medications Prior to Admission medications   Medication Sig Start Date End Date Taking? Authorizing Provider  ABILIFY  MAINTENA 400 MG PRSY prefilled syringe Inject 400 mg into the muscle every 28 (twenty-eight) days. 06/13/21   [provider]  acetaminophen  (TYLENOL ) 325 MG tablet Take 650 mg by mouth every 6 (six) hours as needed for moderate pain.    [provider]  albuterol  (PROVENTIL ) (2.5 MG/3ML) 0.083% nebulizer solution Take 3 mLs (2.5 mg total) by nebulization every 6 (six) hours as needed for wheezing or shortness of breath. Patient not taking: Reported on 04/01/2023 12/17/21   Raspet, Erin K, PA-C  albuterol  (VENTOLIN  HFA) 108 (90 Base) MCG/ACT inhaler Inhale 2 puffs into the lungs every 6 (six) hours as needed for wheezing. 12/17/21   Raspet, Betsey Brow, PA-C  aspirin  EC 81 MG tablet Take 1 tablet (81 mg total) by mouth 2 (two) times daily. For DVT prophylaxis for 30 days after surgery. 07/24/21   Gawne, Meghan M, PA-C  benztropine  (COGENTIN ) 0.5 MG tablet Take 0.5 mg by mouth 2 (two) times daily.    [provider]  budesonide -formoterol  (SYMBICORT ) 160-4.5 MCG/ACT inhaler Inhale 2 puffs into the lungs in the morning and at bedtime. 11/27/21   Crain, Whitney L, PA  cetirizine (ZYRTEC) 10 MG tablet Take 10 mg by mouth daily. 05/30/22   [provider]  cyclobenzaprine  (FLEXERIL ) 5 MG tablet Take 5-10 mg by mouth at bedtime as needed for muscle spasms. 03/07/23   [provider]  esomeprazole (NEXIUM) 40 MG capsule Take 40 mg by mouth daily.  09/22/19   [provider]  fluticasone  (FLONASE ) 50 MCG/ACT nasal spray Place 2 sprays into both nostrils daily as needed for allergies.    [provider]  furosemide  (LASIX ) 40 MG tablet Take 40 mg by mouth 2 (two) times daily. 10/30/22   [provider]  hydrOXYzine  (VISTARIL ) 25 MG capsule Take 25 mg by mouth daily. 06/16/19   [provider]  lithium  300 MG tablet Take 300 mg by mouth 2 (two) times daily. 10/07/22   [provider]  losartan  (COZAAR ) 25 MG tablet Take 25 mg by mouth daily. 09/18/19   Dianah Fort, PA  methocarbamol  (ROBAXIN ) 500 MG tablet Take 1 tablet (500 mg total) by mouth every 6 (six) hours as needed for muscle spasms. 03/28/23   Virl Grimes, MD  methocarbamol  (ROBAXIN ) 500 MG tablet Take 1 tablet (500 mg total) by mouth every 6 (six) hours as needed for muscle spasms. 04/02/23   Virl Grimes, MD  montelukast  (SINGULAIR ) 10 MG tablet Take 10 mg by mouth daily. 04/24/22   [provider]  oxybutynin  (DITROPAN -XL) 10 MG 24 hr tablet Take 10 mg by mouth daily.    [provider]  oxyCODONE -acetaminophen  (PERCOCET/ROXICET) 5-325 MG tablet Take 1-2 tablets by mouth every 4 (four) hours as needed for severe pain. 03/28/23   Virl Grimes, MD  PARoxetine  (PAXIL -CR) 25 MG 24 hr tablet Take 25 mg by mouth daily. 10/07/22   [provider]  potassium chloride  (KLOR-CON ) 10 MEQ tablet Take 10 mEq by mouth daily. 04/24/22   [provider]  QUEtiapine  (SEROQUEL ) 200 MG tablet Take 300 mg by mouth at bedtime. 10/29/22   [provider]  tiZANidine  (ZANAFLEX ) 4 MG tablet Take 4 mg by mouth 2 (two) times daily as needed for muscle spasms.    [provider]      Allergies    Risperidone and related    Review of Systems   Review of Systems  HENT:  Positive for mouth sores.     Physical Exam Updated Vital Signs BP (!) 151/77 (BP Location: Right Arm)   Pulse 93   Temp 98.6 F (37  C) (Oral)   Resp 16   LMP 12/09/2015 Comment: very irregular  SpO2 97%  Physical Exam  ED Results / Procedures / Treatments   Labs (all labs ordered are listed, but only abnormal results are displayed) Labs Reviewed - No data to display  EKG None  Radiology No results found.  Procedures Procedures  {Document cardiac monitor, telemetry assessment procedure when appropriate:1}  Medications Ordered in ED Medications - No data to display  ED Course/ Medical Decision Making/ A&P   {   Click here for ABCD2, HEART and other calculatorsREFRESH Note before signing :1}                              Medical Decision Making  ***  {Document critical care time when appropriate:1} {Document review of labs and clinical decision tools ie heart score, Chads2Vasc2 etc:1}  {Document your independent review of radiology images, and any outside records:1} {Document your discussion with family members, caretakers, and with consultants:1} {Document social determinants of health affecting pt's care:1} {Document your decision making why or why not admission, treatments were needed:1} Final Clinical Impression(s) / ED Diagnoses Final diagnoses:  None    Rx / DC Orders ED Discharge Orders     None

## 2024-02-17 NOTE — ED Triage Notes (Signed)
 Abscess in mouth Right side X 1 weeks

## 2024-02-17 NOTE — Discharge Instructions (Signed)
 Call your dentist for an appointment later this week or for Monday of next week. Take medications as prescribed. Return to the ED with any new or concerning symptoms.

## 2024-02-21 ENCOUNTER — Other Ambulatory Visit: Payer: Self-pay

## 2024-02-21 ENCOUNTER — Encounter (HOSPITAL_BASED_OUTPATIENT_CLINIC_OR_DEPARTMENT_OTHER): Payer: Self-pay | Admitting: Emergency Medicine

## 2024-02-21 ENCOUNTER — Emergency Department (HOSPITAL_BASED_OUTPATIENT_CLINIC_OR_DEPARTMENT_OTHER)
Admission: EM | Admit: 2024-02-21 | Discharge: 2024-02-21 | Disposition: A | Discharge status: Home / Self Care | Payer: MEDICAID | Attending: Emergency Medicine | Admitting: Emergency Medicine

## 2024-02-21 DIAGNOSIS — F17200 Nicotine dependence, unspecified, uncomplicated: Secondary | ICD-10-CM | POA: Insufficient documentation

## 2024-02-21 DIAGNOSIS — K0889 Other specified disorders of teeth and supporting structures: Secondary | ICD-10-CM | POA: Diagnosis present

## 2024-02-21 DIAGNOSIS — Z7982 Long term (current) use of aspirin: Secondary | ICD-10-CM | POA: Diagnosis not present

## 2024-02-21 DIAGNOSIS — K047 Periapical abscess without sinus: Secondary | ICD-10-CM | POA: Insufficient documentation

## 2024-02-21 MED ORDER — HYDROCODONE-ACETAMINOPHEN 5-325 MG PO TABS: 1.0000 | ORAL_TABLET | ORAL | 0 refills | Status: AC | PRN

## 2024-02-21 MED ORDER — HYDROCODONE-ACETAMINOPHEN 5-325 MG PO TABS
1.0000 | ORAL_TABLET | Freq: Once | ORAL | Status: AC
  Administered 2024-02-21: 1 via ORAL
  Filled 2024-02-21: qty 1

## 2024-02-21 NOTE — ED Triage Notes (Signed)
 Pt was seen for tooth abscess prior (right upper cheek), has been taking the antibiotics but is worse. Pt mentions that she did fall and bruised her right hip but states taht is not bothering her.

## 2024-02-21 NOTE — ED Provider Notes (Signed)
 [place] EMERGENCY DEPARTMENT AT [facility] Provider Note   CSN: [id number] Arrival date & time: 02/21/24  1130     History  Chief Complaint  Patient presents with   Abscess    Alicia Blake is a 58 y.o. female.  58 y.o female with a PMH tobacco use presents to the ED via EMS with a chief complaint of dental pain.  Patient reports she was evaluated in the emergency department 2 days ago, prescribed antibiotics to help with her dental abscess, reports that she has increased pain, has not had any relief.  She did schedule an appointment with a dentist which she will be seeing on this upcoming Monday.  She has not tried any other medication to help with symptoms.  She continues to endorse tobacco use and has been smoking since her diagnosis of abscess.  No fever, no facial swelling, no trouble with her tolerating her secretions.  The history is provided by the patient.  Abscess Associated symptoms: no fever        Home Medications Prior to Admission medications   Medication Sig Start Date End Date Taking? Authorizing Provider  HYDROcodone -acetaminophen  (NORCO/VICODIN) 5-325 MG tablet Take 1 tablet by mouth every 4 (four) hours as needed for up to 3 days. 02/21/24 02/24/24 Yes Victoriah Wilds, PA-C  ABILIFY  MAINTENA 400 MG PRSY prefilled syringe Inject 400 mg into the muscle every 28 (twenty-eight) days. 06/13/21   [provider]  acetaminophen  (TYLENOL ) 325 MG tablet Take 650 mg by mouth every 6 (six) hours as needed for moderate pain.    [provider]  albuterol  (PROVENTIL ) (2.5 MG/3ML) 0.083% nebulizer solution Take 3 mLs (2.5 mg total) by nebulization every 6 (six) hours as needed for wheezing or shortness of breath. Patient not taking: Reported on 04/01/2023 12/17/21   Raspet, Erin K, PA-C  albuterol  (VENTOLIN  HFA) 108 (90 Base) MCG/ACT inhaler Inhale 2 puffs into the lungs every 6 (six) hours as needed for wheezing. 12/17/21   Raspet, Betsey Brow, PA-C   amoxicillin  (AMOXIL ) 500 MG capsule Take 2 capsules (1,000 mg total) by mouth 2 (two) times daily. 02/17/24   Mandy Second, PA-C  aspirin  EC 81 MG tablet Take 1 tablet (81 mg total) by mouth 2 (two) times daily. For DVT prophylaxis for 30 days after surgery. 07/24/21   Gawne, Meghan M, PA-C  benztropine  (COGENTIN ) 0.5 MG tablet Take 0.5 mg by mouth 2 (two) times daily.    [provider]  budesonide -formoterol  (SYMBICORT ) 160-4.5 MCG/ACT inhaler Inhale 2 puffs into the lungs in the morning and at bedtime. 11/27/21   Crain, Whitney L, PA  cetirizine (ZYRTEC) 10 MG tablet Take 10 mg by mouth daily. 05/30/22   [provider]  cyclobenzaprine  (FLEXERIL ) 5 MG tablet Take 5-10 mg by mouth at bedtime as needed for muscle spasms. 03/07/23   [provider]  esomeprazole (NEXIUM) 40 MG capsule Take 40 mg by mouth daily. 09/22/19   [provider]  fluticasone  (FLONASE ) 50 MCG/ACT nasal spray Place 2 sprays into both nostrils daily as needed for allergies.    [provider]  furosemide  (LASIX ) 40 MG tablet Take 40 mg by mouth 2 (two) times daily. 10/30/22   [provider]  hydrOXYzine  (VISTARIL ) 25 MG capsule Take 25 mg by mouth daily. 06/16/19   [provider]  lithium  300 MG tablet Take 300 mg by mouth 2 (two) times daily. 10/07/22   [provider]  losartan  (COZAAR ) 25 MG tablet Take  25 mg by mouth daily. 09/18/19   Dianah Fort, PA  methocarbamol  (ROBAXIN ) 500 MG tablet Take 1 tablet (500 mg total) by mouth every 6 (six) hours as needed for muscle spasms. 03/28/23   Virl Grimes, MD  methocarbamol  (ROBAXIN ) 500 MG tablet Take 1 tablet (500 mg total) by mouth every 6 (six) hours as needed for muscle spasms. 04/02/23   Virl Grimes, MD  montelukast  (SINGULAIR ) 10 MG tablet Take 10 mg by mouth daily. 04/24/22   [provider]  oxybutynin  (DITROPAN -XL) 10 MG 24 hr tablet Take 10 mg by mouth daily.    [provider]   oxyCODONE -acetaminophen  (PERCOCET/ROXICET) 5-325 MG tablet Take 1-2 tablets by mouth every 4 (four) hours as needed for severe pain. 03/28/23   Virl Grimes, MD  PARoxetine  (PAXIL -CR) 25 MG 24 hr tablet Take 25 mg by mouth daily. 10/07/22   [provider]  potassium chloride  (KLOR-CON ) 10 MEQ tablet Take 10 mEq by mouth daily. 04/24/22   [provider]  QUEtiapine  (SEROQUEL ) 200 MG tablet Take 300 mg by mouth at bedtime. 10/29/22   [provider]  tiZANidine  (ZANAFLEX ) 4 MG tablet Take 4 mg by mouth 2 (two) times daily as needed for muscle spasms.    [provider]      Allergies    Risperidone and related    Review of Systems   Review of Systems  Constitutional:  Negative for fever.  HENT:  Positive for dental problem.     Physical Exam Updated Vital Signs BP (!) 144/103 (BP Location: Right Arm)   Pulse 86   Temp 97.7 F (36.5 C)   Resp 20   Ht 5\' 6"  (1.676 m)   Wt 101.6 kg   LMP 12/09/2015 Comment: very irregular  SpO2 98%   BMI 36.15 kg/m  Physical Exam Vitals and nursing note reviewed.  Constitutional:      Appearance: Normal appearance.  HENT:     Head: Normocephalic and atraumatic.     Mouth/Throat:     Mouth: Mucous membranes are moist.  Cardiovascular:     Rate and Rhythm: Normal rate.  Pulmonary:     Effort: Pulmonary effort is normal.  Abdominal:     General: Abdomen is flat.  Musculoskeletal:     Cervical back: Normal range of motion and neck supple.  Skin:    General: Skin is warm and dry.  Neurological:     Mental Status: She is alert and oriented to person, place, and time.     ED Results / Procedures / Treatments   Labs (all labs ordered are listed, but only abnormal results are displayed) Labs Reviewed - No data to display  EKG None  Radiology No results found.  Procedures Procedures    Medications Ordered in ED Medications  HYDROcodone -acetaminophen  (NORCO/VICODIN) 5-325 MG per tablet 1  tablet (1 tablet Oral Given 02/21/24 1329)    ED Course/ Medical Decision Making/ A&P                                 Medical Decision Making   Patient presents to the ED with dental abscess via EMS, complaining of worsening pain and facial swelling.  On exam she has no trouble tolerating her secretions, no trouble with her speech.  Face looks symmetric without any swelling noted by me.  There is 2 palpable abscesses on the outer and inner part of her gumline.  She does have poor dentition throughout, does endorse tobacco use.  No fever, no facial pain.  She was seen here 2 days ago and prescribed Percocet to help with pain control, has taken this medication but reports she ran out therefore she was given a refill by me on today's visit.  We discussed appropriate follow-up with dentist, she does have an appointment scheduled on Monday.  Return precautions discussed at length.  Patient stable for discharge.     Portions of this note were generated with Scientist, clinical (histocompatibility and immunogenetics). Dictation errors may occur despite best attempts at proofreading.   Final Clinical Impression(s) / ED Diagnoses Final diagnoses:  Dental abscess    Rx / DC Orders ED Discharge Orders          Ordered    HYDROcodone -acetaminophen  (NORCO/VICODIN) 5-325 MG tablet  Every 4 hours PRN        02/21/24 1328              Berkeley Veldman, PA-C 02/21/24 1333    Almond Army, MD 02/23/24 2212

## 2024-02-21 NOTE — Discharge Instructions (Addendum)
 You are given medication in order to help treat your dental infection, please take 1 tablet as needed to help with pain.  You may finish the antibiotic course that you were given by the emergency department.  You will need to follow-up with a dentist as early as possible.

## 2024-02-21 NOTE — ED Triage Notes (Signed)
 Originally called for fall/ but she was walking when ems saw her, she started complaining of abscess on gums pain. Was seen here prior . Pt came from home ,brought in by [facility].

## 2024-04-22 ENCOUNTER — Other Ambulatory Visit (INDEPENDENT_AMBULATORY_CARE_PROVIDER_SITE_OTHER): Payer: MEDICAID

## 2024-04-22 ENCOUNTER — Ambulatory Visit: Payer: MEDICAID | Admitting: Orthopedic Surgery

## 2024-04-22 DIAGNOSIS — G8929 Other chronic pain: Secondary | ICD-10-CM

## 2024-04-22 DIAGNOSIS — M5441 Lumbago with sciatica, right side: Secondary | ICD-10-CM

## 2024-04-22 DIAGNOSIS — M25551 Pain in right hip: Secondary | ICD-10-CM | POA: Diagnosis not present

## 2024-04-26 ENCOUNTER — Encounter: Payer: Self-pay | Admitting: Orthopedic Surgery

## 2024-04-26 NOTE — Progress Notes (Signed)
 Office Visit Note   Patient: Alicia Blake           Date of Birth: 11-30-1965           MRN: [id number] Visit Date: 04/22/2024              Requested by: Shelda Atlas, MD [address] [place],  [place] [address] PCP: Shelda Atlas, MD  Chief Complaint  Patient presents with   Right Hip - Pain   Lower Back - Pain      HPI: Patient is a 58 year old woman who is seen for evaluation of right lower extremity pain.  She states she has been having lower back and right hip pain for several months.  She states the pain radiates laterally from the buttocks radiates down the right leg to the ankle and also radiates over to the groin.  She states her leg feels heavy.  Patient states that she has undergone surgical intervention with lumbar spine fusion with Dr. Beuford.  Assessment & Plan: Visit Diagnoses:  1. Chronic right-sided low back pain with right-sided sciatica   2. Pain in right hip     Plan: Recommended follow-up with Dr. Beuford.  Most likely patient will need CT myelogram to further identify her pathology.  Follow-Up Instructions: No follow-ups on file.   Ortho Exam  Patient is alert, oriented, no adenopathy, well-dressed, normal affect, normal respiratory effort. Examination patient does not have an abductor lurch.  There is no pain with range of motion of the right hip knee or ankle.  She has pain in the right buttocks radiating to the lateral right ankle.  Patient states this pain is similar to the pain she had prior to surgery.  She has a positive straight leg raise on the right with no focal motor weakness on the right lower extremity.    Imaging: No results found. No images are attached to the encounter.  Labs: Lab Results  Component Value Date   HGBA1C 5.6 04/18/2020   HGBA1C 5.5 02/09/2020   HGBA1C 5.8 (H) 06/16/2017   REPTSTATUS 03/16/2020 FINAL 03/10/2020   GRAMSTAIN NO WBC SEEN NO ORGANISMS SEEN  03/10/2020   CULT  03/10/2020    No growth  aerobically or anaerobically. Performed at [facility], [address], [place], [place] [address]      Lab Results  Component Value Date   ALBUMIN  3.5 12/08/2022   ALBUMIN  2.9 (L) 06/22/2022   ALBUMIN  3.2 (L) 04/18/2020    No results found for: MG Lab Results  Component Value Date   VD25OH 37 03/15/2021    No results found for: PREALBUMIN    Latest Ref Rng & Units 04/01/2023    8:25 AM 03/18/2023   11:30 AM 12/08/2022   12:13 AM  CBC EXTENDED  WBC 4.0 - 10.5 K/uL 12.9  12.7  7.6   RBC 3.87 - 5.11 MIL/uL 3.07  4.89  4.31   Hemoglobin 12.0 - 15.0 g/dL 8.9  85.7  87.2   HCT 63.9 - 46.0 % 27.6  44.5  39.0   Platelets 150 - 400 K/uL 286  253  206   NEUT# 1.7 - 7.7 K/uL 9.9   6.0   Lymph# 0.7 - 4.0 K/uL 2.0   1.0      There is no height or weight on file to calculate BMI.  Orders:  Orders Placed This Encounter  Procedures   XR Lumbar Spine 2-3 Views   XR HIP UNILAT W OR  W/O PELVIS 2-3 VIEWS RIGHT   No orders of the defined types were placed in this encounter.    Procedures: No procedures performed  Clinical Data: No additional findings.  ROS:  All other systems negative, except as noted in the HPI. Review of Systems  Objective: Vital Signs: LMP 12/09/2015 Comment: very irregular  Specialty Comments:  No specialty comments available.  PMFS History: Patient Active Problem List   Diagnosis Date Noted   Radiculopathy, lumbar region 03/26/2023   Acid reflux 11/07/2022   Lumbar foraminal stenosis 04/21/2020   Tongue lesion 03/04/2019   Macromastia 08/11/2018   Laryngopharyngeal reflux (LPR) 12/16/2017   Pharyngeal dysphagia 12/16/2017   Right ear impacted cerumen 12/16/2017   Lumbar stenosis with neurogenic claudication 06/18/2017   Bilateral sensorineural hearing loss 10/14/2016   Subjective tinnitus, bilateral 10/14/2016   Tobacco abuse 04/19/2016   Primary osteoarthritis of left hip 04/18/2016   Opioid use disorder, mild, abuse (HCC)  09/08/2015   Schizoaffective disorder, bipolar type (HCC) 09/07/2015   Chronic pain syndrome 09/07/2015   Alcohol use disorder, moderate, in sustained remission (HCC) 09/07/2015   Cocaine use disorder, moderate, in sustained remission (HCC) 09/07/2015   Cannabis use disorder, moderate, in sustained remission (HCC) 09/07/2015   DDD (degenerative disc disease), lumbar 09/07/2015   Hx of fracture of ankle 09/07/2015   Arthropathy of ankle and foot 03/04/2014   Hammertoe 03/04/2014   Cystocele, unspecified 09/03/2013   Symptomatic Fibroids 08/09/2013   Breast lump on left side at 9 o'clock position 06/29/2013   Breast discharge 06/29/2013   Closed disp fx of lateral condyle of left tibia with routine healing 11/12/2012   Asthma 08/13/2011   History of gastroesophageal reflux (GERD) 08/13/2011   Hyperglycemia 08/13/2011   Post-traumatic stress disorder, unspecified 08/13/2011   Past Medical History:  Diagnosis Date   Asthma    Bipolar affect, depressed (HCC)    Bronchitis    Chronic back pain    Closed left ankle fracture    COPD (chronic obstructive pulmonary disease) (HCC)    DJD (degenerative joint disease)    BACK   GERD (gastroesophageal reflux disease)    Hypertension    Pneumonia    Pre-diabetes    dr entered diabetes without complications- no meds does not test sugar at home   PTSD (post-traumatic stress disorder)    Scoliosis    Seizures (HCC)    when drinking last yrs ago   Shortness of breath dyspnea    exersion   Substance abuse (HCC)    crack cocaine, 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, none since   Thyroid disease    UTI (lower urinary tract infection)     Family History  Problem Relation Age of Onset   Hypertension Mother    Hypertension Maternal Grandmother    Hypertension Paternal Grandmother    Diabetes Paternal Grandmother    Hypertension Paternal Grandfather    Diabetes Paternal Grandfather    Alcohol abuse Paternal Aunt    Mental illness Cousin     Heart attack Cousin    Heart attack Maternal Uncle     Past Surgical History:  Procedure Laterality Date   2 LEFT TOES SURGERY  12/2014   finished only on right side   ABDOMINAL EXPOSURE N/A 03/26/2023   Procedure: ABDOMINAL EXPOSURE;  Surgeon: Gretta Lonni PARAS, MD;  Location: [facility] OR;  Service: Vascular;  Laterality: N/A;   ABDOMINAL EXPOSURE N/A 04/02/2023   Procedure: ABDOMINAL EXPOSURE;  Surgeon: Gretta Lonni PARAS, MD;  Location: [facility] OR;  Service: Vascular;  Laterality: N/A;   ANTERIOR LUMBAR FUSION N/A 03/26/2023   Procedure: REMOVAL OF POSTERIOR HARDWARE;  Surgeon: Beuford Anes, MD;  Location: [facility] OR;  Service: Orthopedics;  Laterality: N/A;   ANTERIOR LUMBAR FUSION N/A 03/26/2023   Procedure: LUMBAR FOUR - LUMBAR FIVE, LUMBAR FIVE - SACRUM ONE ANTERIOR LUMBAR INTERBODY FUSION WITH INSTRUMENTATION AND ALLOGRAFT;  Surgeon: Beuford Anes, MD;  Location: [facility] OR;  Service: Orthopedics;  Laterality: N/A;   ANTERIOR LUMBAR FUSION N/A 04/02/2023   Procedure: REVISION ANTERIOR LUMBAR FUSION L5-S1;  Surgeon: Beuford Anes, MD;  Location: [facility] OR;  Service: Orthopedics;  Laterality: N/A;   BACK SURGERY     BALLOON DILATION N/A 12/14/2015   Procedure: BALLOON DILATION;  Surgeon: Norleen Hint, MD;  Location: [facility] END[facility]COPY;  Service: Endoscopy;  Laterality: N/A;   COLONOSCOPY WITH PROPOFOL  N/A 12/14/2015   Procedure: COLONOSCOPY WITH PROPOFOL ;  Surgeon: Norleen Hint, MD;  Location: [facility] END[facility]COPY;  Service: Endoscopy;  Laterality: N/A;   ESOPHAGOGASTRODUODENOSCOPY (EGD) WITH PROPOFOL  N/A 12/14/2015   Procedure: ESOPHAGOGASTRODUODENOSCOPY (EGD) WITH PROPOFOL ;  Surgeon: Norleen Hint, MD;  Location: [facility];  Service: Endoscopy;  Laterality: N/A;   FRACTURE SURGERY     JOINT REPLACEMENT     left hip   LEFT ANKLE SURGERY  12/2014   LUMBAR LAMINECTOMY/DECOMPRESSION MICRODISCECTOMY N/A 06/18/2017   Procedure: LAMINECTOMY AND FORAMINOTOMY LUMBAR FOUR - LUMBAR FIVE;  Surgeon: Gillie Duncans, MD;  Location: [facility]  OR;  Service: Neurosurgery;  Laterality: N/A;  LAMINECTOMY AND FORAMINOTOMY LUMBAR 4- LUMBAR 5   LUMBAR WOUND DEBRIDEMENT N/A 03/10/2020   Procedure: Lumbar Wound Debridement;  Surgeon: Gillie Duncans, MD;  Location: [facility] OR;  Service: Neurosurgery;  Laterality: N/A;  posterior   NECK SURGERY  AS TEENAGER   STITCHES TO NECK    ORIF ANKLE FRACTURE Left 07/24/2021   Procedure: OPEN REDUCTION INTERNAL FIXATION (ORIF) ANKLE FRACTURE;  Surgeon: Beverley Evalene BIRCH, MD;  Location: [facility];  Service: Orthopedics;  Laterality: Left;   SURGERY FOR CUT ON STOMACH  TEENAGER   TOTAL HIP ARTHROPLASTY Left 05/14/2016   Procedure: TOTAL HIP ARTHROPLASTY ANTERIOR APPROACH;  Surgeon: Evalene BIRCH Beverley, MD;  Location: [facility] OR;  Service: Orthopedics;  Laterality: Left;   Social History   Occupational History   Not on file  Tobacco Use   Smoking status: Every Day    Current packs/day: 0.50    Average packs/day: 0.5 packs/day for 26.0 years (13.0 ttl pk-yrs)    Types: Cigarettes   Smokeless tobacco: Never  Vaping Use   Vaping status: Never Used  Substance and Sexual Activity   Alcohol use: No    Comment: quit date 01/17/2013   Drug use: No    Types: Crack cocaine, Heroin    Comment: none since 03-2020   Sexual activity: Yes    Birth control/protection: None, Post-menopausal

## 2024-05-26 ENCOUNTER — Ambulatory Visit: Payer: MEDICAID | Admitting: Orthopedic Surgery

## 2024-08-17 NOTE — Progress Notes (Signed)
 Sent message, via epic in basket, requesting orders in epic from Careers adviser.

## 2024-08-19 NOTE — H&P (Signed)
 KNEE ARTHROPLASTY ADMISSION H&P  Patient ID: Alicia Blake MRN: [id number] DOB/AGE: 05/14/1966 59 y.o.  Chief Complaint: right knee pain  Planned Procedure Date: 08/31/24  Medical and cardiac Clearance by Alicia Amy  PA-C   HPI: Alicia Blake is a 58 y.o. female who presents for evaluation of OA RIGHT KNEE. The patient has a history of pain and functional disability in the right knee due to arthritis and has failed non-surgical conservative treatments for greater than 12 weeks to include NSAID's and/or analgesics, corticosteriod injections, use of assistive devices, and activity modification.  Onset of symptoms was gradual, starting 3 years ago with gradually worsening course since that time. The patient noted no past surgery on the right knee.  Patient currently rates pain at 10 out of 10 with activity. Patient has night pain, worsening of pain with activity and weight bearing, and pain that interferes with activities of daily living.  Patient has evidence of subchondral sclerosis, periarticular osteophytes, and joint space narrowing by imaging studies.  There is no active infection.  Past Medical History:  Diagnosis Date   Asthma    Bipolar affect, depressed (HCC)    Bronchitis    Chronic back pain    Closed left ankle fracture    COPD (chronic obstructive pulmonary disease) (HCC)    DJD (degenerative joint disease)    BACK   GERD (gastroesophageal reflux disease)    Hypertension    Pneumonia    Pre-diabetes    dr entered diabetes without complications- no meds does not test sugar at home   PTSD (post-traumatic stress disorder)    Scoliosis    Seizures (HCC)    when drinking last yrs ago   Shortness of breath dyspnea    exersion   Substance abuse (HCC)    crack cocaine, 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, none since   Thyroid disease    UTI (lower urinary tract infection)    Past Surgical History:  Procedure Laterality Date   2 LEFT TOES SURGERY  12/2014   finished only  on right side   ABDOMINAL EXPOSURE N/A 03/26/2023   Procedure: ABDOMINAL EXPOSURE;  Surgeon: Gretta Lonni PARAS, MD;  Location: [facility] OR;  Service: Vascular;  Laterality: N/A;   ABDOMINAL EXPOSURE N/A 04/02/2023   Procedure: ABDOMINAL EXPOSURE;  Surgeon: Gretta Lonni PARAS, MD;  Location: [facility] OR;  Service: Vascular;  Laterality: N/A;   ANTERIOR LUMBAR FUSION N/A 03/26/2023   Procedure: REMOVAL OF POSTERIOR HARDWARE;  Surgeon: Beuford Anes, MD;  Location: [facility] OR;  Service: Orthopedics;  Laterality: N/A;   ANTERIOR LUMBAR FUSION N/A 03/26/2023   Procedure: LUMBAR FOUR - LUMBAR FIVE, LUMBAR FIVE - SACRUM ONE ANTERIOR LUMBAR INTERBODY FUSION WITH INSTRUMENTATION AND ALLOGRAFT;  Surgeon: Beuford Anes, MD;  Location: [facility] OR;  Service: Orthopedics;  Laterality: N/A;   ANTERIOR LUMBAR FUSION N/A 04/02/2023   Procedure: REVISION ANTERIOR LUMBAR FUSION L5-S1;  Surgeon: Beuford Anes, MD;  Location: [facility] OR;  Service: Orthopedics;  Laterality: N/A;   BACK SURGERY     BALLOON DILATION N/A 12/14/2015   Procedure: BALLOON DILATION;  Surgeon: Norleen Hint, MD;  Location: [facility]COPY;  Service: Endoscopy;  Laterality: N/A;   COLONOSCOPY WITH PROPOFOL  N/A 12/14/2015   Procedure: COLONOSCOPY WITH PROPOFOL ;  Surgeon: Norleen Hint, MD;  Location: [facility];  Service: Endoscopy;  Laterality: N/A;   ESOPHAGOGASTRODUODENOSCOPY (EGD) WITH PROPOFOL  N/A 12/14/2015   Procedure: ESOPHAGOGASTRODUODENOSCOPY (EGD) WITH PROPOFOL ;  Surgeon: Norleen Hint, MD;  Location: [facility];  Service: Endoscopy;  Laterality: N/A;  FRACTURE SURGERY     JOINT REPLACEMENT     left hip   LEFT ANKLE SURGERY  12/2014   LUMBAR LAMINECTOMY/DECOMPRESSION MICRODISCECTOMY N/A 06/18/2017   Procedure: LAMINECTOMY AND FORAMINOTOMY LUMBAR FOUR - LUMBAR FIVE;  Surgeon: Gillie Duncans, MD;  Location: [facility] OR;  Service: Neurosurgery;  Laterality: N/A;  LAMINECTOMY AND FORAMINOTOMY LUMBAR 4- LUMBAR 5   LUMBAR WOUND DEBRIDEMENT N/A 03/10/2020   Procedure: Lumbar  Wound Debridement;  Surgeon: Gillie Duncans, MD;  Location: [facility] OR;  Service: Neurosurgery;  Laterality: N/A;  posterior   NECK SURGERY  AS TEENAGER   STITCHES TO NECK    ORIF ANKLE FRACTURE Left 07/24/2021   Procedure: OPEN REDUCTION INTERNAL FIXATION (ORIF) ANKLE FRACTURE;  Surgeon: Beverley Evalene BIRCH, MD;  Location: [facility];  Service: Orthopedics;  Laterality: Left;   SURGERY FOR CUT ON STOMACH  TEENAGER   TOTAL HIP ARTHROPLASTY Left 05/14/2016   Procedure: TOTAL HIP ARTHROPLASTY ANTERIOR APPROACH;  Surgeon: Evalene BIRCH Beverley, MD;  Location: [facility] OR;  Service: Orthopedics;  Laterality: Left;   Allergies  Allergen Reactions   Risperidone And Paliperidone Swelling and Other (See Comments)    Facial swelling   Prior to Admission medications   Medication Sig Start Date End Date Taking? Authorizing Provider  ABILIFY  MAINTENA 400 MG PRSY prefilled syringe Inject 400 mg into the muscle every 28 (twenty-eight) days. 06/13/21   [provider]  acetaminophen  (TYLENOL ) 325 MG tablet Take 650 mg by mouth every 6 (six) hours as needed for moderate pain.    [provider]  albuterol  (PROVENTIL ) (2.5 MG/3ML) 0.083% nebulizer solution Take 3 mLs (2.5 mg total) by nebulization every 6 (six) hours as needed for wheezing or shortness of breath. Patient not taking: Reported on 04/01/2023 12/17/21   Raspet, Erin K, PA-C  albuterol  (VENTOLIN  HFA) 108 (90 Base) MCG/ACT inhaler Inhale 2 puffs into the lungs every 6 (six) hours as needed for wheezing. 12/17/21   Raspet, Rocky K, PA-C  amoxicillin  (AMOXIL ) 500 MG capsule Take 2 capsules (1,000 mg total) by mouth 2 (two) times daily. 02/17/24   Alicia Balls, PA-C  aspirin  EC 81 MG tablet Take 1 tablet (81 mg total) by mouth 2 (two) times daily. For DVT prophylaxis for 30 days after surgery. 07/24/21   Alicia Blake M, PA-C  benztropine  (COGENTIN ) 0.5 MG tablet Take 0.5 mg by mouth 2 (two) times daily.    [provider]   budesonide -formoterol  (SYMBICORT ) 160-4.5 MCG/ACT inhaler Inhale 2 puffs into the lungs in the morning and at bedtime. 11/27/21   Crain, Whitney L, PA  cetirizine (ZYRTEC) 10 MG tablet Take 10 mg by mouth daily. 05/30/22   [provider]  cyclobenzaprine  (FLEXERIL ) 5 MG tablet Take 5-10 mg by mouth at bedtime as needed for muscle spasms. 03/07/23   [provider]  esomeprazole (NEXIUM) 40 MG capsule Take 40 mg by mouth daily. 09/22/19   [provider]  fluticasone  (FLONASE ) 50 MCG/ACT nasal spray Place 2 sprays into both nostrils daily as needed for allergies.    [provider]  furosemide  (LASIX ) 40 MG tablet Take 40 mg by mouth 2 (two) times daily. 10/30/22   [provider]  hydrOXYzine  (VISTARIL ) 25 MG capsule Take 25 mg by mouth daily. 06/16/19   [provider]  lithium  300 MG tablet Take 300 mg by mouth 2 (two) times daily. 10/07/22   [provider]  losartan  (COZAAR ) 25 MG tablet Take 25 mg by mouth daily. 09/18/19  Rosalea Alicia SAILOR, PA  methocarbamol  (ROBAXIN ) 500 MG tablet Take 1 tablet (500 mg total) by mouth every 6 (six) hours as needed for muscle spasms. 03/28/23   Beuford Anes, MD  methocarbamol  (ROBAXIN ) 500 MG tablet Take 1 tablet (500 mg total) by mouth every 6 (six) hours as needed for muscle spasms. 04/02/23   Beuford Anes, MD  montelukast  (SINGULAIR ) 10 MG tablet Take 10 mg by mouth daily. 04/24/22   [provider]  oxybutynin  (DITROPAN -XL) 10 MG 24 hr tablet Take 10 mg by mouth daily.    [provider]  oxyCODONE -acetaminophen  (PERCOCET/ROXICET) 5-325 MG tablet Take 1-2 tablets by mouth every 4 (four) hours as needed for severe pain. 03/28/23   Beuford Anes, MD  PARoxetine  (PAXIL -CR) 25 MG 24 hr tablet Take 25 mg by mouth daily. 10/07/22   [provider]  potassium chloride  (KLOR-CON ) 10 MEQ tablet Take 10 mEq by mouth daily. 04/24/22   [provider]  QUEtiapine  (SEROQUEL )  200 MG tablet Take 300 mg by mouth at bedtime. 10/29/22   [provider]  tiZANidine  (ZANAFLEX ) 4 MG tablet Take 4 mg by mouth 2 (two) times daily as needed for muscle spasms.    [provider]   Social History   Socioeconomic History   Marital status: Single    Spouse name: Not on file   Number of children: Not on file   Years of education: Not on file   Highest education level: Not on file  Occupational History   Not on file  Tobacco Use   Smoking status: Every Day    Current packs/day: 0.50    Average packs/day: 0.5 packs/day for 26.0 years (13.0 ttl pk-yrs)    Types: Cigarettes   Smokeless tobacco: Never  Vaping Use   Vaping status: Never Used  Substance and Sexual Activity   Alcohol use: No    Comment: quit date 01/17/2013   Drug use: No    Types: Crack cocaine, Heroin    Comment: none since 03-2020   Sexual activity: Yes    Birth control/protection: None, Post-menopausal  Other Topics Concern   Not on file  Social History Narrative   Not on file   Social Drivers of Health   Financial Resource Strain: Not on file  Food Insecurity: Not on file  Transportation Needs: Not on file  Physical Activity: Not on file  Stress: Not on file  Social Connections: Not on file   Family History  Problem Relation Age of Onset   Hypertension Mother    Hypertension Maternal Grandmother    Hypertension Paternal Grandmother    Diabetes Paternal Grandmother    Hypertension Paternal Grandfather    Diabetes Paternal Grandfather    Alcohol abuse Paternal Aunt    Mental illness Cousin    Heart attack Cousin    Heart attack Maternal Uncle     ROS: Currently denies lightheadedness, dizziness, Fever, chills, CP, SOB.   No personal history of DVT, PE, MI, or CVA. No loose teeth. Partial dentures present All other systems have been reviewed and were otherwise currently negative with the exception of those mentioned in the HPI and as above.  Objective: Vitals:  Ht: 5'5 Wt: 225 lbs Temp: 98.7 BP: 138/82 Pulse: 101 O2 92% on room air.   Physical Exam: General: Alert, NAD.  Antalgic Gait  HEENT: EOMI, Good Neck Extension  Pulm: No increased work of breathing.  Clear B/L A/P w/o rales or rhonchi. Slight wheezing heard on inspiration in  the upper lung fields. CV: Tachycardic rate with RR, No m/g/r appreciated  GI: soft, NT, ND. BS x 4 quadrants Neuro: CN II-XII grossly intact without focal deficit.  Sensation intact distally Skin: No lesions in the area of chief complaint MSK/Surgical Site:  + JLT. ROM 0-100 degrees.  4/5 strength in extension and flexion.  +EHL/FHL.  NVI.  Stable varus and valgus stress.    Imaging Review Plain radiographs demonstrate severe degenerative joint disease of the right knee.   The overall alignment ismild varus. The bone quality appears to be fair for age and reported activity level.  Preoperative templating of the joint replacement has been completed, documented, and submitted to the Operating Room personnel in order to optimize intra-operative equipment management.  Assessment: OA RIGHT KNEE Active Problems:   * No active hospital problems. *   Plan: Plan for Procedure(s): ARTHROPLASTY, KNEE, TOTAL  The patient history, physical exam, clinical judgement of the provider and imaging are consistent with end stage degenerative joint disease and total joint arthroplasty is deemed medically necessary. The treatment options including medical management, injection therapy, and arthroplasty were discussed at length. The risks and benefits of Procedure(s): ARTHROPLASTY, KNEE, TOTAL were presented and reviewed.  The risks of nonoperative treatment, versus surgical intervention including but not limited to continued pain, aseptic loosening, stiffness, dislocation/subluxation, infection, bleeding, nerve injury, blood clots, cardiopulmonary complications, morbidity, mortality, among others were discussed. The patient verbalizes  understanding and wishes to proceed with the plan.  Patient is being admitted for inpatient treatment for surgery, pain control, PT, prophylactic antibiotics, VTE prophylaxis, progressive ambulation, ADL's and discharge planning.   Dental prophylaxis discussed and recommended for 2 years postoperatively.  The patient does meet the criteria for TXA which will be used perioperatively.   ASA 81 mg BID will be used postoperatively for DVT prophylaxis in addition to SCDs, and early ambulation. Plan for Tylenol , Celebrex , oxycodone  for pain.   Robaxin  for muscle spasms.   Zofran  for nausea and vomiting. Senokot is for constipation prevention. Pharmacy- [facility] on [address] The patient is planning to be discharged home with OPPT and into the care of her mother Roseline who can be reached at [phone] Follow up appt 09/15/24 at 4:15pm     Gerard CHRISTELLA Large, PA-C Office [phone] 08/19/2024 3:00 PM

## 2024-08-22 NOTE — Progress Notes (Signed)
 COVID Vaccine received:  []  No [x]  Yes Date of any COVID positive Test in last 90 days:  none  PCP - Aliene Colon, MD Rosina Amy, PA at  [facility]   [phone]  Cardiologist -  none  Chest x-ray -  12-08-2022  2v  EKG -  12-08-2022   repeat Stress Test -  04-25-2016   ECHO - 04-25-2016  Cardiac Cath -  CT Coronary Calcium  score:   Pacemaker / ICD device [x]  No []  Yes   Spinal Cord Stimulator:[x]  No []  Yes       History of Sleep Apnea? [x]  No []  Yes   CPAP used?- [x]  No []  Yes    Patient has: []  NO Hx DM   [x]  Pre-DM   []  DM1  []   DM2 Does the patient monitor blood sugar?   []  N/A   [x]  No []  Yes  Last A1c was: 6.0  on  12-05-23     Blood Thinner / Instructions:   none Aspirin  Instructions:  none  Dental hx: []  Dentures:  []  N/A      [x]  Bridge or Partial: Top or bottom                  []  Loose or Damaged teeth:   Activity level: Able to walk up 2 flights of stairs without becoming significantly short of breath or having chest pain?  [x]  No   SOB and would have leg pain  []    Yes  Patient can perform ADLs without assistance. 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  Patient verbalized understanding and agreement to the Pre-Surgical Instructions that were given to them at this PAT appointment. Patient was also educated of the need to review these PAT instructions again prior to her surgery.I reviewed the appropriate phone numbers to call if they have any and questions or concerns.

## 2024-08-22 NOTE — Patient Instructions (Signed)
 SURGICAL WAITING ROOM VISITATION Patients having surgery or a procedure may have no more than 2 support people in the waiting area - these visitors may rotate in the visitor waiting room.   If the patient needs to stay at the hospital during part of their recovery, the visitor guidelines for inpatient rooms apply.  PRE-OP VISITATION  Pre-op nurse will coordinate an appropriate time for 1 support person to accompany the patient in pre-op.  This support person may not rotate.  This visitor will be contacted when the time is appropriate for the visitor to come back in the pre-op area.  Please refer to the [facility] website for the visitor guidelines for Inpatients (after your surgery is over and you are in a regular room).  You are not required to quarantine at this time prior to your surgery. However, you must do this: Hand Hygiene often Do NOT share personal items Notify your provider if you are in close contact with someone who has COVID or you develop fever 100.4 or greater, new onset of sneezing, cough, sore throat, shortness of breath or body aches.  If you test positive for Covid or have been in contact with anyone that has tested positive in the last 10 days please notify you surgeon.    Your procedure is scheduled on:  TUESDAY  August 31, 2024  Report to [facility] Main Entrance: [address] entrance where the [employer] is available.   Report to admitting at: 05:15    AM  Call this number if you have any questions or problems the morning of surgery [phone]  Do not eat food after Midnight the night prior to your surgery/procedure.  After Midnight you may have the following liquids until  04:30 AM DAY OF SURGERY  Clear Liquid Diet Water Black Coffee (sugar ok, NO MILK/CREAM OR CREAMERS)  Tea (sugar ok, NO MILK/CREAM OR CREAMERS) regular and decaf                             Plain Jell-O  with no fruit (NO RED)                                           Fruit  ices (not with fruit pulp, NO RED)                                     Popsicles (NO RED)                                                                  Juice: NO CITRUS JUICES: only apple, WHITE grape, WHITE cranberry Sports drinks like Gatorade or Powerade (NO RED)                   The day of surgery:  Drink ONE (1) Pre-Surgery G2 at   04:30 AM the morning of surgery. Drink in one sitting. Do not sip.  This drink was given to you during your hospital pre-op appointment visit. Nothing else to drink after completing  the Pre-Surgery G2 : No candy, chewing gum or throat lozenges.    FOLLOW ANY ADDITIONAL PRE OP INSTRUCTIONS YOU RECEIVED FROM YOUR SURGEON'S OFFICE!!!   Oral Hygiene is also important to reduce your risk of infection.        Remember - BRUSH YOUR TEETH THE MORNING OF SURGERY WITH YOUR REGULAR TOOTHPASTE  Do NOT smoke after Midnight the night before surgery.  STOP TAKING all Vitamins, Herbs and supplements 1 week before your surgery.   Take ONLY these medicines the morning of surgery with A SIP OF WATER: esomeprazole, cetirizine, lithium , paroxetine , montelukast , and Tylenol  if needed. You may use your Nasal spray and inhalers if needed. Please bring your Albuterol  inhaler with you on the day of surgery  DO NOT TAKE LOSARTAN  or FUROSEMIDE  the morning of your surgery.   You may not have any metal on your body including hair pins, jewelry, and body piercing  Do not wear make-up, lotions, powders, perfumes  or deodorant  Do not wear nail polish including gel and S&S, artificial / acrylic nails, or any other type of covering on natural nails including finger and toenails. If you have artificial nails, gel coating, etc., that needs to be removed by a nail salon, Please have this removed prior to surgery. Not doing so may mean that your surgery could be cancelled or delayed if the Surgeon or anesthesia staff feels like they are unable to monitor you safely.   Do not shave 48  hours prior to surgery to avoid nicks in your skin which may contribute to postoperative infections.   Contacts, Hearing Aids, dentures or bridgework may not be worn into surgery. DENTURES WILL BE REMOVED PRIOR TO SURGERY PLEASE DO NOT APPLY Poly grip OR ADHESIVES!!!  Patients discharged on the day of surgery will not be allowed to drive home.  Someone NEEDS to stay with you for the first 24 hours after anesthesia.  Do not bring your home medications to the hospital. The Pharmacy will dispense medications listed on your medication list to you during your admission in the Hospital.  Please read over the following fact sheets you were given: IF YOU HAVE QUESTIONS ABOUT YOUR PRE-OP INSTRUCTIONS, PLEASE CALL [phone].      Pre-operative 4 CHG Bath Instructions   You can play a key role in reducing the risk of infection after surgery. Your skin needs to be as free of germs as possible. You can reduce the number of germs on your skin by washing with CHG (chlorhexidine  gluconate) soap before surgery. CHG is an antiseptic soap that kills germs and continues to kill germs even after washing.   DO NOT use if you have an allergy to chlorhexidine /CHG or antibacterial soaps. If your skin becomes reddened or irritated, stop using the CHG and notify one of our RNs at   Please shower with the CHG soap starting 4 days before surgery using the following schedule: FRIDAY  August 27, 2024    Please keep in mind the following:  DO NOT shave, including legs and underarms, starting the day of your first shower.   You may shave your face at any point before/day of surgery.  Place clean sheets on your bed the day you start using CHG soap. Use a clean washcloth (not used since being washed) for each shower. DO NOT sleep with pets once you start using the CHG.  CHG Shower Instructions:  If you choose to wash your hair and private area, wash first with your normal  shampoo/soap.  After you use  shampoo/soap, rinse your hair and body thoroughly to remove shampoo/soap residue.  Turn the water OFF and apply about 3 tablespoons (45 ml) of CHG soap to a CLEAN washcloth.  Apply CHG soap ONLY FROM YOUR NECK DOWN TO YOUR TOES (washing for 3-5 minutes)  DO NOT use CHG soap on face, private areas, open wounds, or sores.  Pay special attention to the area where your surgery is being performed.  If you are having back surgery, having someone wash your back for you may be helpful. Wait 2 minutes after CHG soap is applied, then you may rinse off the CHG soap.  Pat dry with a clean towel  Put on clean clothes/pajamas   If you choose to wear lotion, please use ONLY the CHG-compatible lotions on the back of this paper.     Additional instructions for the day of surgery: DO NOT APPLY any CHG Soap,  lotions, deodorants, cologne, or perfumes on the day of surgery  Put on clean/comfortable clothes.  Brush your teeth.  Ask your nurse before applying any prescription medications to the skin.   CHG Compatible Lotions   Aveeno Moisturizing lotion  Cetaphil Moisturizing Cream  Cetaphil Moisturizing Lotion  Clairol Herbal Essence Moisturizing Lotion, Dry Skin  Clairol Herbal Essence Moisturizing Lotion, Extra Dry Skin  Clairol Herbal Essence Moisturizing Lotion, Normal Skin  Curel Age Defying Therapeutic Moisturizing Lotion with Alpha Hydroxy  Curel Extreme Care Body Lotion  Curel Soothing Hands Moisturizing Hand Lotion  Curel Therapeutic Moisturizing Cream, Fragrance-Free  Curel Therapeutic Moisturizing Lotion, Fragrance-Free  Curel Therapeutic Moisturizing Lotion, Original Formula  Eucerin Daily Replenishing Lotion  Eucerin Dry Skin Therapy Plus Alpha Hydroxy Crme  Eucerin Dry Skin Therapy Plus Alpha Hydroxy Lotion  Eucerin Original Crme  Eucerin Original Lotion  Eucerin Plus Crme Eucerin Plus Lotion  Eucerin TriLipid Replenishing Lotion  Keri Anti-Bacterial Hand Lotion  Keri Deep  Conditioning Original Lotion Dry Skin Formula Softly Scented  Keri Deep Conditioning Original Lotion, Fragrance Free Sensitive Skin Formula  Keri Lotion Fast Absorbing Fragrance Free Sensitive Skin Formula  Keri Lotion Fast Absorbing Softly Scented Dry Skin Formula  Keri Original Lotion  Keri Skin Renewal Lotion Keri Silky Smooth Lotion  Keri Silky Smooth Sensitive Skin Lotion  Nivea Body Creamy Conditioning Oil  Nivea Body Extra Enriched Lotion  Nivea Body Original Lotion  Nivea Body Sheer Moisturizing Lotion Nivea Crme  Nivea Skin Firming Lotion  NutraDerm 30 Skin Lotion  NutraDerm Skin Lotion  NutraDerm Therapeutic Skin Cream  NutraDerm Therapeutic Skin Lotion  ProShield Protective Hand Cream  Provon moisturizing lotion   FAILURE TO FOLLOW THESE INSTRUCTIONS MAY RESULT IN THE CANCELLATION OF YOUR SURGERY  PATIENT SIGNATURE_________________________________  NURSE SIGNATURE__________________________________  ________________________________________________________________________       Alicia Blake    An incentive spirometer is a tool that can help keep your lungs clear and active. This tool measures how well you are filling your lungs with each breath. Taking long deep breaths may help reverse or decrease the chance of developing breathing (pulmonary) problems (especially infection) following: A long period of time when you are unable to move or be active. BEFORE THE PROCEDURE  If the spirometer includes an indicator to show your best effort, your nurse or respiratory therapist will set it to a desired goal. If possible, sit up straight or lean slightly forward. Try not to slouch. Hold the incentive spirometer in an upright position. INSTRUCTIONS FOR USE  Sit on the edge of your  bed if possible, or sit up as far as you can in bed or on a chair. Hold the incentive spirometer in an upright position. Breathe out normally. Place the mouthpiece in your mouth and  seal your lips tightly around it. Breathe in slowly and as deeply as possible, raising the piston or the ball toward the top of the column. Hold your breath for 3-5 seconds or for as long as possible. Allow the piston or ball to fall to the bottom of the column. Remove the mouthpiece from your mouth and breathe out normally. Rest for a few seconds and repeat Steps 1 through 7 at least 10 times every 1-2 hours when you are awake. Take your time and take a few normal breaths between deep breaths. The spirometer may include an indicator to show your best effort. Use the indicator as a goal to work toward during each repetition. After each set of 10 deep breaths, practice coughing to be sure your lungs are clear. If you have an incision (the cut made at the time of surgery), support your incision when coughing by placing a pillow or rolled up towels firmly against it. Once you are able to get out of bed, walk around indoors and cough well. You may stop using the incentive spirometer when instructed by your caregiver.  RISKS AND COMPLICATIONS Take your time so you do not get dizzy or light-headed. If you are in pain, you may need to take or ask for pain medication before doing incentive spirometry. It is harder to take a deep breath if you are having pain. AFTER USE Rest and breathe slowly and easily. It can be helpful to keep track of a log of your progress. Your caregiver can provide you with a simple table to help with this. If you are using the spirometer at home, follow these instructions: SEEK MEDICAL CARE IF:  You are having difficultly using the spirometer. You have trouble using the spirometer as often as instructed. Your pain medication is not giving enough relief while using the spirometer. You develop fever of 100.5 F (38.1 C) or higher.                                                                                                    SEEK IMMEDIATE MEDICAL CARE IF:  You cough up bloody  sputum that had not been present before. You develop fever of 102 F (38.9 C) or greater. You develop worsening pain at or near the incision site. MAKE SURE YOU:  Understand these instructions. Will watch your condition. Will get help right away if you are not doing well or get worse. Document Released: 02/24/2007 Document Revised: 01/06/2012 Document Reviewed: 04/27/2007 [facility] Patient Information 2014 [place], [place].      If you would like to see a video about joint replacement:   [email or web address]

## 2024-08-24 ENCOUNTER — Encounter (HOSPITAL_COMMUNITY)
Admission: RE | Admit: 2024-08-24 | Discharge: 2024-08-24 | Disposition: A | Discharge status: Home / Self Care | Source: Ambulatory Visit | Attending: Orthopedic Surgery | Admitting: Orthopedic Surgery

## 2024-08-24 ENCOUNTER — Encounter (HOSPITAL_COMMUNITY): Payer: Self-pay

## 2024-08-24 ENCOUNTER — Other Ambulatory Visit: Payer: Self-pay

## 2024-08-24 VITALS — BP 125/72 | HR 101 | Temp 98.5°F | Resp 22 | Ht 66.0 in | Wt 220.0 lb

## 2024-08-24 DIAGNOSIS — F1021 Alcohol dependence, in remission: Secondary | ICD-10-CM | POA: Diagnosis not present

## 2024-08-24 DIAGNOSIS — M1711 Unilateral primary osteoarthritis, right knee: Secondary | ICD-10-CM | POA: Insufficient documentation

## 2024-08-24 DIAGNOSIS — Z01818 Encounter for other preprocedural examination: Secondary | ICD-10-CM | POA: Insufficient documentation

## 2024-08-24 DIAGNOSIS — I10 Essential (primary) hypertension: Secondary | ICD-10-CM | POA: Diagnosis not present

## 2024-08-24 DIAGNOSIS — K219 Gastro-esophageal reflux disease without esophagitis: Secondary | ICD-10-CM | POA: Diagnosis not present

## 2024-08-24 DIAGNOSIS — F17210 Nicotine dependence, cigarettes, uncomplicated: Secondary | ICD-10-CM | POA: Diagnosis not present

## 2024-08-24 DIAGNOSIS — J449 Chronic obstructive pulmonary disease, unspecified: Secondary | ICD-10-CM | POA: Diagnosis not present

## 2024-08-24 DIAGNOSIS — F1110 Opioid abuse, uncomplicated: Secondary | ICD-10-CM | POA: Diagnosis not present

## 2024-08-24 LAB — COMPREHENSIVE METABOLIC PANEL WITH GFR
ALT: 8 U/L (ref 0–44)
AST: 16 U/L (ref 15–41)
Albumin: 3.8 g/dL (ref 3.5–5.0)
Alkaline Phosphatase: 102 U/L (ref 38–126)
Anion gap: 5 (ref 5–15)
BUN: 7 mg/dL (ref 6–20)
CO2: 31 mmol/L (ref 22–32)
Calcium: 9.8 mg/dL (ref 8.9–10.3)
Chloride: 101 mmol/L (ref 98–111)
Creatinine, Ser: 0.85 mg/dL (ref 0.44–1.00)
GFR, Estimated: >60 mL/min (ref >60)
Glucose, Bld: 102 mg/dL — ABNORMAL HIGH (ref 70–99)
Potassium: 4.2 mmol/L (ref 3.5–5.1)
Sodium: 138 mmol/L (ref 135–145)
Total Bilirubin: 0.5 mg/dL (ref 0.0–1.2)
Total Protein: 8.7 g/dL — ABNORMAL HIGH (ref 6.5–8.1)

## 2024-08-24 LAB — CBC
HCT: 43 % (ref 36.0–46.0)
Hemoglobin: 13.6 g/dL (ref 12.0–15.0)
MCH: 28.6 pg (ref 26.0–34.0)
MCHC: 31.6 g/dL (ref 30.0–36.0)
MCV: 90.5 fL (ref 80.0–100.0)
Platelets: 207 K/uL (ref 150–400)
RBC: 4.75 MIL/uL (ref 3.87–5.11)
RDW: 14.8 % (ref 11.5–15.5)
WBC: 7.5 K/uL (ref 4.0–10.5)
nRBC: 0 % (ref 0.0–0.2)

## 2024-08-24 LAB — SURGICAL PCR SCREEN
MRSA, PCR: NEGATIVE
Staphylococcus aureus: NEGATIVE

## 2024-08-25 NOTE — Progress Notes (Signed)
 Anesthesia Chart Review   Case: [id number] Date/Time: 08/31/24 0715   Procedure: ARTHROPLASTY, KNEE, TOTAL (Right: Knee)   Anesthesia type: Spinal   Pre-op diagnosis: OA RIGHT KNEE   [facility]: [facility] ROOM 08 / [facility]   Surgeons: Beverley Evalene BIRCH, MD       DISCUSSION:58 y.o. every day smoker with h/o HTN, GERD, COPD, right knee OA scheduled for above procedure 08/31/2024 with Dr. Evalene Beverley.   H/o lumbar fusion L4-S1.   Pt has been referred for sleep study.   Per most recent anesthesia note 04/02/23, Difficult Airway- due to limited oral opening and Difficult Airway- due to large tongue.  Video-laryngoscopy and rigid stylet used.   H/o substance abuse, cocaine, heroine, ETOH.   Pt was seen by PCP 08/05/2024 for preoperative evaluation.  Per OV note pt cleared for surgery.  VS: BP 125/72 Comment: right arm sitting  Pulse (!) 101   Temp 36.9 C (Oral)   Resp (!) 22   Ht 5' 6 (1.676 m)   Wt 99.8 kg   LMP 12/09/2015 Comment: very irregular  SpO2 98%   BMI 35.51 kg/m   PROVIDERS: Shelda Atlas, MD is PCP    LABS: Labs reviewed: Acceptable for surgery. (all labs ordered are listed, but only abnormal results are displayed)  Labs Reviewed  COMPREHENSIVE METABOLIC PANEL WITH GFR - Abnormal; Notable for the following components:      Result Value   Glucose, Bld 102 (*)    Total Protein 8.7 (*)    All other components within normal limits  SURGICAL PCR SCREEN  CBC  NICOTINE /COTININE METABOLITES     IMAGES:   EKG:   CV: Echo 04/25/2016 - Left ventricle: The cavity size was normal. Wall thickness was    normal. Systolic function was normal. The estimated ejection    fraction was in the range of 55% to 60%. Wall motion was normal;    there were no regional wall motion abnormalities. Doppler    parameters are consistent with abnormal left ventricular    relaxation (grade 1 diastolic dysfunction).  - Aortic valve: There was no stenosis.  - Mitral valve: There was  trivial regurgitation.  - Right ventricle: The cavity size was normal. Systolic function    was normal.  - Pulmonary arteries: PA peak pressure: 27 mm Hg (S).  - Inferior vena cava: The vessel was normal in size. The    respirophasic diameter changes were in the normal range (= 50%),    consistent with normal central venous pressure.  - Pericardium, extracardiac: Small circumferential pericardial    effusion, no tamponade.  Past Medical History:  Diagnosis Date   Asthma    Bipolar affect, depressed (HCC)    Bronchitis    Chronic back pain    Closed left ankle fracture    COPD (chronic obstructive pulmonary disease) (HCC)    DJD (degenerative joint disease)    BACK   GERD (gastroesophageal reflux disease)    Hypertension    Pneumonia    Pre-diabetes    dr entered diabetes without complications- no meds does not test sugar at home   PTSD (post-traumatic stress disorder)    Scoliosis    Seizures (HCC)    when drinking last yrs ago   Shortness of breath dyspnea    exersion   Substance abuse (HCC)    crack cocaine, 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, none since   Thyroid disease    UTI (lower urinary tract infection)     Past  Surgical History:  Procedure Laterality Date   2 LEFT TOES SURGERY  12/2014   finished only on right side   ABDOMINAL EXPOSURE N/A 03/26/2023   Procedure: ABDOMINAL EXPOSURE;  Surgeon: Gretta Lonni PARAS, MD;  Location: [facility] OR;  Service: Vascular;  Laterality: N/A;   ABDOMINAL EXPOSURE N/A 04/02/2023   Procedure: ABDOMINAL EXPOSURE;  Surgeon: Gretta Lonni PARAS, MD;  Location: [facility] OR;  Service: Vascular;  Laterality: N/A;   ANTERIOR LUMBAR FUSION N/A 03/26/2023   Procedure: REMOVAL OF POSTERIOR HARDWARE;  Surgeon: Beuford Anes, MD;  Location: [facility] OR;  Service: Orthopedics;  Laterality: N/A;   ANTERIOR LUMBAR FUSION N/A 03/26/2023   Procedure: LUMBAR FOUR - LUMBAR FIVE, LUMBAR FIVE - SACRUM ONE ANTERIOR LUMBAR INTERBODY FUSION WITH INSTRUMENTATION AND  ALLOGRAFT;  Surgeon: Beuford Anes, MD;  Location: [facility] OR;  Service: Orthopedics;  Laterality: N/A;   ANTERIOR LUMBAR FUSION N/A 04/02/2023   Procedure: REVISION ANTERIOR LUMBAR FUSION L5-S1;  Surgeon: Beuford Anes, MD;  Location: [facility] OR;  Service: Orthopedics;  Laterality: N/A;   BACK SURGERY     BALLOON DILATION N/A 12/14/2015   Procedure: BALLOON DILATION;  Surgeon: Norleen Hint, MD;  [facility];  Service: Endoscopy;  Laterality: N/A;   COLONOSCOPY WITH PROPOFOL  N/A 12/14/2015   Procedure: COLONOSCOPY WITH PROPOFOL ;  Surgeon: Norleen Hint, MD;  [facility];  Service: Endoscopy;  Laterality: N/A;   ESOPHAGOGASTRODUODENOSCOPY (EGD) WITH PROPOFOL  N/A 12/14/2015   Procedure: ESOPHAGOGASTRODUODENOSCOPY (EGD) WITH PROPOFOL ;  Surgeon: Norleen Hint, MD;  [facility];  Service: Endoscopy;  Laterality: N/A;   FRACTURE SURGERY     JOINT REPLACEMENT     left hip   LEFT ANKLE SURGERY  12/2014   LUMBAR LAMINECTOMY/DECOMPRESSION MICRODISCECTOMY N/A 06/18/2017   Procedure: LAMINECTOMY AND FORAMINOTOMY LUMBAR FOUR - LUMBAR FIVE;  Surgeon: Gillie Duncans, MD;  Location[facility];  Service: Neurosurgery;  Laterality: N/A;  LAMINECTOMY AND FORAMINOTOMY LUMBAR 4- LUMBAR 5   LUMBAR WOUND DEBRIDEMENT N/A 03/10/2020   Procedure: Lumbar Wound Debridement;  Surgeon: Gillie Duncans, MD;  Location: [facility] OR;  Service: Neurosurgery;  Laterality: N/A;  posterior   NECK SURGERY  AS TEENAGER   STITCHES TO NECK    ORIF ANKLE FRACTURE Left 07/24/2021   Procedure: OPEN REDUCTION INTERNAL FIXATION (ORIF) ANKLE FRACTURE;  Surgeon: Beverley Evalene BIRCH, MD;  Location: [facility];  Service: Orthopedics;  Laterality: Left;   SURGERY FOR CUT ON STOMACH  TEENAGER   TOTAL HIP ARTHROPLASTY Left 05/14/2016   Procedure: TOTAL HIP ARTHROPLASTY ANTERIOR APPROACH;  Surgeon: Evalene BIRCH Beverley, MD;  Location[facility];  Service: Orthopedics;  Laterality: Left;    MEDICATIONS:  ABILIFY  ASIMTUFII 720 MG/2.4ML  PRSY   acetaminophen  (TYLENOL ) 325 MG tablet   ADVAIR DISKUS 250-50 MCG/ACT AEPB   albuterol  (PROVENTIL ) (2.5 MG/3ML) 0.083% nebulizer solution   albuterol  (VENTOLIN  HFA) 108 (90 Base) MCG/ACT inhaler   amoxicillin  (AMOXIL ) 500 MG capsule   aspirin  EC 81 MG tablet   budesonide -formoterol  (SYMBICORT ) 160-4.5 MCG/ACT inhaler   cetirizine (ZYRTEC) 10 MG tablet   esomeprazole (NEXIUM) 40 MG capsule   furosemide  (LASIX ) 20 MG tablet   hydrOXYzine  (VISTARIL ) 25 MG capsule   ibuprofen  (ADVIL ) 200 MG tablet   lithium  300 MG tablet   losartan  (COZAAR ) 25 MG tablet   methocarbamol  (ROBAXIN ) 500 MG tablet   methocarbamol  (ROBAXIN ) 500 MG tablet   montelukast  (SINGULAIR ) 10 MG tablet   oxyCODONE -acetaminophen  (PERCOCET/ROXICET) 5-325 MG tablet   PARoxetine  (PAXIL -CR) 25 MG 24 hr tablet  potassium chloride  (KLOR-CON ) 10 MEQ tablet   QUEtiapine  (SEROQUEL ) 300 MG tablet   tiZANidine  (ZANAFLEX ) 4 MG tablet   triamcinolone  (NASACORT  ALLERGY 24HR) 55 MCG/ACT AERO nasal inhaler   No current facility-administered medications for this encounter.     Harlene Hoots Ward, PA-C [facility]-Surgical Testing [phone]

## 2024-08-26 LAB — NICOTINE/COTININE METABOLITES
Cotinine: 275.3 ng/mL
Nicotine: 13.6 ng/mL

## 2024-08-30 ENCOUNTER — Encounter: Payer: Self-pay | Admitting: Radiology

## 2024-08-31 ENCOUNTER — Encounter (HOSPITAL_COMMUNITY): Payer: Self-pay | Admitting: Orthopedic Surgery

## 2024-08-31 ENCOUNTER — Encounter (HOSPITAL_COMMUNITY)
Admission: RE | Disposition: A | Discharge status: Home / Self Care | Payer: Self-pay | Source: Home / Self Care | Attending: Orthopedic Surgery

## 2024-08-31 ENCOUNTER — Ambulatory Visit (HOSPITAL_COMMUNITY): Payer: Self-pay | Admitting: Physician Assistant

## 2024-08-31 ENCOUNTER — Ambulatory Visit (HOSPITAL_COMMUNITY)
Admission: RE | Admit: 2024-08-31 | Discharge: 2024-08-31 | Disposition: A | Discharge status: Home / Self Care | Payer: Self-pay | Attending: Orthopedic Surgery | Admitting: Orthopedic Surgery

## 2024-08-31 ENCOUNTER — Other Ambulatory Visit: Payer: Self-pay

## 2024-08-31 ENCOUNTER — Ambulatory Visit (HOSPITAL_BASED_OUTPATIENT_CLINIC_OR_DEPARTMENT_OTHER)

## 2024-08-31 ENCOUNTER — Ambulatory Visit (HOSPITAL_COMMUNITY)

## 2024-08-31 DIAGNOSIS — F419 Anxiety disorder, unspecified: Secondary | ICD-10-CM | POA: Diagnosis not present

## 2024-08-31 DIAGNOSIS — R7303 Prediabetes: Secondary | ICD-10-CM | POA: Insufficient documentation

## 2024-08-31 DIAGNOSIS — Z981 Arthrodesis status: Secondary | ICD-10-CM | POA: Insufficient documentation

## 2024-08-31 DIAGNOSIS — M1711 Unilateral primary osteoarthritis, right knee: Secondary | ICD-10-CM | POA: Insufficient documentation

## 2024-08-31 DIAGNOSIS — F319 Bipolar disorder, unspecified: Secondary | ICD-10-CM | POA: Diagnosis not present

## 2024-08-31 DIAGNOSIS — I10 Essential (primary) hypertension: Secondary | ICD-10-CM | POA: Insufficient documentation

## 2024-08-31 DIAGNOSIS — F17210 Nicotine dependence, cigarettes, uncomplicated: Secondary | ICD-10-CM | POA: Diagnosis not present

## 2024-08-31 DIAGNOSIS — J4489 Other specified chronic obstructive pulmonary disease: Secondary | ICD-10-CM | POA: Insufficient documentation

## 2024-08-31 DIAGNOSIS — K219 Gastro-esophageal reflux disease without esophagitis: Secondary | ICD-10-CM | POA: Diagnosis not present

## 2024-08-31 DIAGNOSIS — M25761 Osteophyte, right knee: Secondary | ICD-10-CM | POA: Insufficient documentation

## 2024-08-31 DIAGNOSIS — Z96651 Presence of right artificial knee joint: Secondary | ICD-10-CM

## 2024-08-31 HISTORY — PX: TOTAL KNEE ARTHROPLASTY: SHX125

## 2024-08-31 SURGERY — ARTHROPLASTY, KNEE, TOTAL
Anesthesia: General | Site: Knee | Laterality: Right

## 2024-08-31 MED ORDER — METHOCARBAMOL 1000 MG/10ML IJ SOLN
500.0000 mg | Freq: Four times a day (QID) | INTRAMUSCULAR | Status: DC | PRN
Start: 2024-08-31 — End: 2024-08-31

## 2024-08-31 MED ORDER — ORAL CARE MOUTH RINSE: 15.0000 mL | Freq: Once | OROMUCOSAL | Status: AC

## 2024-08-31 MED ORDER — LACTATED RINGERS IV BOLUS
250.0000 mL | Freq: Once | INTRAVENOUS | Status: AC
  Administered 2024-08-31: 250 mL via INTRAVENOUS

## 2024-08-31 MED ORDER — POVIDONE-IODINE 10 % EX SWAB: 2.0000 | Freq: Once | CUTANEOUS | Status: DC

## 2024-08-31 MED ORDER — ACETAMINOPHEN 500 MG PO TABS
1000.0000 mg | ORAL_TABLET | Freq: Once | ORAL | Status: AC
  Administered 2024-08-31: 1000 mg via ORAL
  Filled 2024-08-31: qty 2

## 2024-08-31 MED ORDER — LIDOCAINE HCL (PF) 2 % IJ SOLN
INTRAMUSCULAR | Status: AC
  Filled 2024-08-31: qty 5

## 2024-08-31 MED ORDER — FENTANYL CITRATE (PF) 50 MCG/ML IJ SOSY: 25.0000 ug | PREFILLED_SYRINGE | INTRAMUSCULAR | Status: DC | PRN

## 2024-08-31 MED ORDER — PROPOFOL 10 MG/ML IV BOLUS
INTRAVENOUS | Status: DC | PRN
  Administered 2024-08-31: 10 mg via INTRAVENOUS
  Administered 2024-08-31: 80 mg via INTRAVENOUS
  Administered 2024-08-31 (×2): 20 mg via INTRAVENOUS

## 2024-08-31 MED ORDER — DROPERIDOL 2.5 MG/ML IJ SOLN: 0.6250 mg | Freq: Once | INTRAMUSCULAR | Status: DC | PRN

## 2024-08-31 MED ORDER — OXYCODONE HCL 5 MG PO TABS: 5.0000 mg | ORAL_TABLET | ORAL | 0 refills | Status: AC | PRN

## 2024-08-31 MED ORDER — ROCURONIUM BROMIDE 10 MG/ML (PF) SYRINGE
PREFILLED_SYRINGE | INTRAVENOUS | Status: DC | PRN
  Administered 2024-08-31: 60 mg via INTRAVENOUS

## 2024-08-31 MED ORDER — SODIUM CHLORIDE (PF) 0.9 % IJ SOLN
INTRAMUSCULAR | Status: AC
Start: 2024-08-31 — End: 2024-08-31
  Filled 2024-08-31: qty 30

## 2024-08-31 MED ORDER — FENTANYL CITRATE (PF) 100 MCG/2ML IJ SOLN
INTRAMUSCULAR | Status: DC | PRN
  Administered 2024-08-31 (×2): 50 ug via INTRAVENOUS

## 2024-08-31 MED ORDER — PHENYLEPHRINE 80 MCG/ML (10ML) SYRINGE FOR IV PUSH (FOR BLOOD PRESSURE SUPPORT)
PREFILLED_SYRINGE | INTRAVENOUS | Status: AC
  Filled 2024-08-31: qty 10

## 2024-08-31 MED ORDER — ACETAMINOPHEN 500 MG PO TABS: 1000.0000 mg | ORAL_TABLET | Freq: Four times a day (QID) | ORAL | Status: DC

## 2024-08-31 MED ORDER — SODIUM CHLORIDE (PF) 0.9 % IJ SOLN
INTRAMUSCULAR | Status: DC | PRN
  Administered 2024-08-31: 80 mL via INTRAMUSCULAR

## 2024-08-31 MED ORDER — BUPIVACAINE-EPINEPHRINE (PF) 0.25% -1:200000 IJ SOLN
INTRAMUSCULAR | Status: AC
  Filled 2024-08-31: qty 30

## 2024-08-31 MED ORDER — CEFAZOLIN SODIUM-DEXTROSE 2-4 GM/100ML-% IV SOLN
2.0000 g | INTRAVENOUS | Status: AC
  Administered 2024-08-31: 2 g via INTRAVENOUS
  Filled 2024-08-31: qty 100

## 2024-08-31 MED ORDER — ASPIRIN 81 MG PO TBEC: 81.0000 mg | DELAYED_RELEASE_TABLET | Freq: Two times a day (BID) | ORAL | 0 refills | Status: AC

## 2024-08-31 MED ORDER — WATER FOR IRRIGATION, STERILE IR SOLN
Status: DC | PRN
  Administered 2024-08-31: 2000 mL

## 2024-08-31 MED ORDER — ONDANSETRON HCL 4 MG PO TABS: 4.0000 mg | ORAL_TABLET | Freq: Four times a day (QID) | ORAL | Status: DC | PRN

## 2024-08-31 MED ORDER — ONDANSETRON 4 MG PO TBDP: 4.0000 mg | ORAL_TABLET | Freq: Three times a day (TID) | ORAL | 0 refills | Status: AC | PRN

## 2024-08-31 MED ORDER — CEFAZOLIN SODIUM-DEXTROSE 2-4 GM/100ML-% IV SOLN
INTRAVENOUS | Status: AC
  Filled 2024-08-31: qty 100

## 2024-08-31 MED ORDER — ALBUTEROL SULFATE HFA 108 (90 BASE) MCG/ACT IN AERS
INHALATION_SPRAY | RESPIRATORY_TRACT | Status: DC | PRN
  Administered 2024-08-31: 5 via RESPIRATORY_TRACT

## 2024-08-31 MED ORDER — TRANEXAMIC ACID-NACL 1000-0.7 MG/100ML-% IV SOLN
INTRAVENOUS | Status: AC
Start: 2024-08-31 — End: 2024-08-31
  Filled 2024-08-31: qty 100

## 2024-08-31 MED ORDER — ONDANSETRON HCL 4 MG/2ML IJ SOLN
4.0000 mg | Freq: Four times a day (QID) | INTRAMUSCULAR | Status: DC | PRN
  Administered 2024-08-31: 4 mg via INTRAVENOUS

## 2024-08-31 MED ORDER — CELECOXIB 200 MG PO CAPS: 200.0000 mg | ORAL_CAPSULE | Freq: Two times a day (BID) | ORAL | 0 refills | Status: AC

## 2024-08-31 MED ORDER — SODIUM CHLORIDE 0.9 % IR SOLN
Status: DC | PRN
  Administered 2024-08-31: 1000 mL

## 2024-08-31 MED ORDER — CHLORHEXIDINE GLUCONATE 0.12 % MT SOLN
15.0000 mL | Freq: Once | OROMUCOSAL | Status: AC
  Administered 2024-08-31: 15 mL via OROMUCOSAL

## 2024-08-31 MED ORDER — ROCURONIUM BROMIDE 10 MG/ML (PF) SYRINGE
PREFILLED_SYRINGE | INTRAVENOUS | Status: AC
  Filled 2024-08-31: qty 10

## 2024-08-31 MED ORDER — MIDAZOLAM HCL (PF) 2 MG/2ML IJ SOLN
INTRAMUSCULAR | Status: DC | PRN
  Administered 2024-08-31 (×2): 1 mg via INTRAVENOUS

## 2024-08-31 MED ORDER — SUGAMMADEX SODIUM 200 MG/2ML IV SOLN
INTRAVENOUS | Status: AC
  Filled 2024-08-31: qty 2

## 2024-08-31 MED ORDER — TRANEXAMIC ACID-NACL 1000-0.7 MG/100ML-% IV SOLN
1000.0000 mg | Freq: Once | INTRAVENOUS | Status: AC
  Administered 2024-08-31: 1000 mg via INTRAVENOUS

## 2024-08-31 MED ORDER — ONDANSETRON HCL 4 MG/2ML IJ SOLN: 4.0000 mg | Freq: Once | INTRAMUSCULAR | Status: DC | PRN

## 2024-08-31 MED ORDER — HYDROMORPHONE HCL 1 MG/ML IJ SOLN: 0.5000 mg | INTRAMUSCULAR | Status: DC | PRN

## 2024-08-31 MED ORDER — METOCLOPRAMIDE HCL 5 MG/ML IJ SOLN: 5.0000 mg | Freq: Three times a day (TID) | INTRAMUSCULAR | Status: DC | PRN

## 2024-08-31 MED ORDER — DEXAMETHASONE SOD PHOSPHATE PF 10 MG/ML IJ SOLN: 8.0000 mg | Freq: Once | INTRAMUSCULAR | Status: DC

## 2024-08-31 MED ORDER — ONDANSETRON HCL 4 MG/2ML IJ SOLN
INTRAMUSCULAR | Status: AC
  Filled 2024-08-31: qty 2

## 2024-08-31 MED ORDER — PHENYLEPHRINE HCL-NACL 20-0.9 MG/250ML-% IV SOLN
INTRAVENOUS | Status: DC | PRN
  Administered 2024-08-31: 40 ug via INTRAVENOUS
  Administered 2024-08-31: 120 ug via INTRAVENOUS
  Administered 2024-08-31: 80 ug via INTRAVENOUS

## 2024-08-31 MED ORDER — METOCLOPRAMIDE HCL 5 MG PO TABS: 5.0000 mg | ORAL_TABLET | Freq: Three times a day (TID) | ORAL | Status: DC | PRN

## 2024-08-31 MED ORDER — 0.9 % SODIUM CHLORIDE (POUR BTL) OPTIME
TOPICAL | Status: DC | PRN
  Administered 2024-08-31: 1000 mL

## 2024-08-31 MED ORDER — SENNA-DOCUSATE SODIUM 8.6-50 MG PO TABS: 2.0000 | ORAL_TABLET | Freq: Every day | ORAL | 1 refills | Status: AC | PRN

## 2024-08-31 MED ORDER — OXYCODONE HCL 5 MG PO TABS: 5.0000 mg | ORAL_TABLET | ORAL | Status: DC | PRN

## 2024-08-31 MED ORDER — ACETAMINOPHEN 325 MG PO TABS: 325.0000 mg | ORAL_TABLET | Freq: Four times a day (QID) | ORAL | Status: DC | PRN

## 2024-08-31 MED ORDER — ROPIVACAINE HCL 5 MG/ML IJ SOLN
INTRAMUSCULAR | Status: DC | PRN
  Administered 2024-08-31: 20 mL via PERINEURAL

## 2024-08-31 MED ORDER — KETAMINE HCL 50 MG/5ML IJ SOSY
PREFILLED_SYRINGE | INTRAMUSCULAR | Status: AC
Start: 2024-08-31 — End: 2024-08-31
  Filled 2024-08-31: qty 5

## 2024-08-31 MED ORDER — ONDANSETRON HCL 4 MG/2ML IJ SOLN
INTRAMUSCULAR | Status: DC | PRN
  Administered 2024-08-31: 4 mg via INTRAVENOUS

## 2024-08-31 MED ORDER — HYDROMORPHONE HCL 1 MG/ML IJ SOLN
INTRAMUSCULAR | Status: DC | PRN
  Administered 2024-08-31 (×3): .4 mg via INTRAVENOUS
  Administered 2024-08-31: .2 mg via INTRAVENOUS
  Administered 2024-08-31: .4 mg via INTRAVENOUS
  Administered 2024-08-31: .2 mg via INTRAVENOUS

## 2024-08-31 MED ORDER — ASPIRIN 81 MG PO CHEW
81.0000 mg | CHEWABLE_TABLET | Freq: Two times a day (BID) | ORAL | Status: DC
  Filled 2024-08-31: qty 1

## 2024-08-31 MED ORDER — ACETAMINOPHEN 500 MG PO TABS: 1000.0000 mg | ORAL_TABLET | Freq: Four times a day (QID) | ORAL | 0 refills | Status: AC | PRN

## 2024-08-31 MED ORDER — OXYCODONE HCL 5 MG PO TABS
ORAL_TABLET | ORAL | Status: AC
  Filled 2024-08-31: qty 2

## 2024-08-31 MED ORDER — OXYCODONE HCL 5 MG PO TABS
10.0000 mg | ORAL_TABLET | ORAL | Status: DC | PRN
  Administered 2024-08-31: 10 mg via ORAL

## 2024-08-31 MED ORDER — BUPIVACAINE LIPOSOME 1.3 % IJ SUSP: 20.0000 mL | Freq: Once | INTRAMUSCULAR | Status: DC

## 2024-08-31 MED ORDER — KETAMINE HCL 10 MG/ML IJ SOLN
INTRAMUSCULAR | Status: DC | PRN
  Administered 2024-08-31: 30 mg via INTRAVENOUS

## 2024-08-31 MED ORDER — ALBUTEROL SULFATE HFA 108 (90 BASE) MCG/ACT IN AERS
INHALATION_SPRAY | RESPIRATORY_TRACT | Status: AC
  Filled 2024-08-31: qty 6.7

## 2024-08-31 MED ORDER — ACETAMINOPHEN 10 MG/ML IV SOLN: 1000.0000 mg | Freq: Once | INTRAVENOUS | Status: DC | PRN

## 2024-08-31 MED ORDER — SUGAMMADEX SODIUM 200 MG/2ML IV SOLN
INTRAVENOUS | Status: DC | PRN
  Administered 2024-08-31: 200 mg via INTRAVENOUS

## 2024-08-31 MED ORDER — LIDOCAINE HCL (PF) 2 % IJ SOLN
INTRAMUSCULAR | Status: DC | PRN
  Administered 2024-08-31: 100 mg via INTRADERMAL

## 2024-08-31 MED ORDER — METHOCARBAMOL 500 MG PO TABS
ORAL_TABLET | ORAL | Status: AC
  Filled 2024-08-31: qty 1

## 2024-08-31 MED ORDER — PROPOFOL 10 MG/ML IV BOLUS
INTRAVENOUS | Status: AC
Start: 2024-08-31 — End: 2024-08-31
  Filled 2024-08-31: qty 20

## 2024-08-31 MED ORDER — LACTATED RINGERS IV BOLUS
500.0000 mL | Freq: Once | INTRAVENOUS | Status: AC
  Administered 2024-08-31: 500 mL via INTRAVENOUS

## 2024-08-31 MED ORDER — MIDAZOLAM HCL 2 MG/2ML IJ SOLN
INTRAMUSCULAR | Status: AC
  Filled 2024-08-31: qty 2

## 2024-08-31 MED ORDER — OXYCODONE HCL 5 MG/5ML PO SOLN: 5.0000 mg | Freq: Once | ORAL | Status: DC | PRN

## 2024-08-31 MED ORDER — METHOCARBAMOL 500 MG PO TABS
500.0000 mg | ORAL_TABLET | Freq: Four times a day (QID) | ORAL | Status: DC | PRN
  Administered 2024-08-31: 500 mg via ORAL

## 2024-08-31 MED ORDER — OXYCODONE HCL 5 MG PO TABS: 5.0000 mg | ORAL_TABLET | Freq: Once | ORAL | Status: DC | PRN

## 2024-08-31 MED ORDER — FENTANYL CITRATE (PF) 100 MCG/2ML IJ SOLN
INTRAMUSCULAR | Status: AC
  Filled 2024-08-31: qty 2

## 2024-08-31 MED ORDER — BUPIVACAINE LIPOSOME 1.3 % IJ SUSP
INTRAMUSCULAR | Status: AC
  Filled 2024-08-31: qty 20

## 2024-08-31 MED ORDER — HYDROMORPHONE HCL 2 MG/ML IJ SOLN
INTRAMUSCULAR | Status: AC
  Filled 2024-08-31: qty 1

## 2024-08-31 MED ORDER — LACTATED RINGERS IV SOLN
INTRAVENOUS | Status: DC
Start: 2024-08-31 — End: 2024-08-31

## 2024-08-31 MED ORDER — CEFAZOLIN SODIUM-DEXTROSE 2-4 GM/100ML-% IV SOLN: 2.0000 g | Freq: Four times a day (QID) | INTRAVENOUS | Status: DC

## 2024-08-31 MED ORDER — TRANEXAMIC ACID-NACL 1000-0.7 MG/100ML-% IV SOLN
1000.0000 mg | INTRAVENOUS | Status: AC
  Administered 2024-08-31: 1000 mg via INTRAVENOUS
  Filled 2024-08-31: qty 100

## 2024-08-31 MED ORDER — METHOCARBAMOL 750 MG PO TABS: 750.0000 mg | ORAL_TABLET | Freq: Three times a day (TID) | ORAL | 0 refills | Status: AC | PRN

## 2024-08-31 SURGICAL SUPPLY — 43 items
BAG COUNTER SPONGE SURGICOUNT (BAG) IMPLANT
BLADE SAG 18X100X1.27 (BLADE) ×1 IMPLANT
BLADE SAGITTAL 25.0X1.37X90 (BLADE) ×1 IMPLANT
BLADE SURG 15 STRL LF DISP TIS (BLADE) ×1 IMPLANT
BNDG ELASTIC 6X10 VLCR STRL LF (GAUZE/BANDAGES/DRESSINGS) ×1 IMPLANT
BOWL SMART MIX CTS (DISPOSABLE) IMPLANT
CLSR STERI-STRIP ANTIMIC 1/2X4 (GAUZE/BANDAGES/DRESSINGS) ×2 IMPLANT
COVER SURGICAL LIGHT HANDLE (MISCELLANEOUS) ×1 IMPLANT
CUFF TRNQT CYL 34X4.125X (TOURNIQUET CUFF) ×1 IMPLANT
DRAPE U-SHAPE 47X51 STRL (DRAPES) ×1 IMPLANT
DRSG MEPILEX POST OP 4X12 (GAUZE/BANDAGES/DRESSINGS) ×1 IMPLANT
DURAPREP 26ML APPLICATOR (WOUND CARE) ×2 IMPLANT
ELECT BLADE TIP CTD 4 INCH (ELECTRODE) IMPLANT
ELECT PENCIL ROCKER SW 15FT (MISCELLANEOUS) ×1 IMPLANT
ELECT REM PT RETURN 15FT ADLT (MISCELLANEOUS) ×1 IMPLANT
GLOVE BIO SURGEON STRL SZ7.5 (GLOVE) ×1 IMPLANT
GLOVE BIOGEL PI IND STRL 7.5 (GLOVE) ×1 IMPLANT
GLOVE BIOGEL PI IND STRL 8 (GLOVE) ×1 IMPLANT
GLOVE SURG SYN 7.0 PF PI (GLOVE) ×1 IMPLANT
GOWN STRL REUS W/ TWL LRG LVL3 (GOWN DISPOSABLE) ×1 IMPLANT
GOWN STRL REUS W/ TWL XL LVL3 (GOWN DISPOSABLE) ×1 IMPLANT
HOLDER FOLEY CATH W/STRAP (MISCELLANEOUS) IMPLANT
IMMOBILIZER KNEE 22 UNIV (SOFTGOODS) IMPLANT
INSERT TIB BEARING X3 9 SZ5 (Insert) IMPLANT
KIT TURNOVER KIT A (KITS) ×1 IMPLANT
KNEE FEMORAL COMP RT RETAIN (Knees) IMPLANT
KNEE PATELLA ASYMMETRIC 10X32 (Knees) IMPLANT
KNEE TIBIAL COMPONENT SZ5 (Knees) IMPLANT
MANIFOLD NEPTUNE II (INSTRUMENTS) ×1 IMPLANT
NS IRRIG 1000ML POUR BTL (IV SOLUTION) ×1 IMPLANT
PACK TOTAL KNEE CUSTOM (KITS) ×1 IMPLANT
PIN FLUTED HEDLESS FIX 3.5X1/8 (PIN) IMPLANT
PROTECTOR NERVE ULNAR (MISCELLANEOUS) ×1 IMPLANT
SET HNDPC FAN SPRY TIP SCT (DISPOSABLE) ×1 IMPLANT
SPIKE FLUID TRANSFER (MISCELLANEOUS) ×1 IMPLANT
SUT MNCRL AB 3-0 PS2 18 (SUTURE) ×1 IMPLANT
SUT VIC AB 0 CT1 36 (SUTURE) ×2 IMPLANT
SUT VIC AB 1 CT1 36 (SUTURE) ×1 IMPLANT
SUT VIC AB 2-0 CT1 TAPERPNT 27 (SUTURE) ×1 IMPLANT
TOWEL GREEN STERILE FF (TOWEL DISPOSABLE) ×1 IMPLANT
TRAY FOLEY MTR SLVR 16FR STAT (SET/KITS/TRAYS/PACK) IMPLANT
TUBE SUCTION HIGH CAP CLEAR NV (SUCTIONS) ×1 IMPLANT
WRAP KNEE MAXI GEL POST OP (GAUZE/BANDAGES/DRESSINGS) ×1 IMPLANT

## 2024-08-31 NOTE — Anesthesia Procedure Notes (Signed)
 Anesthesia Regional Block: Adductor canal block   Pre-Anesthetic Checklist: , timeout performed,  Correct Patient, Correct Site, Correct Laterality,  Correct Procedure, Correct Position, site marked,  Risks and benefits discussed,  Surgical consent,  Pre-op evaluation,  At surgeon's request and post-op pain management  Laterality: Lower and Right  Prep: chloraprep       Needles:  Injection technique: Single-shot  Needle Type: Echogenic Stimulator Needle     Needle Length: 10cm  Needle Gauge: 20     Additional Needles:   Procedures:,,,, ultrasound used (permanent image in chart),, #20gu IV placed    Narrative:  Start time: 08/31/2024 7:07 AM End time: 08/31/2024 7:07 AM Injection made incrementally with aspirations every 5 mL.  Performed by: Personally  Anesthesiologist: Waddell Lauraine NOVAK, MD  Additional Notes: Timeout performed with bedside RN - Name, DOB, allergies and laterality confirmed by the patient and RN. Surgical marking performed/confirmed. Anticoagulation status and most recent platelet count reviewed. Patient placed in a frog-leg position and sedation (as documented by RN) given via PIV. Peripheral nerve block performed as documented above. VSS throughout (see Flowchart).

## 2024-08-31 NOTE — Discharge Instructions (Signed)

## 2024-08-31 NOTE — Anesthesia Preprocedure Evaluation (Addendum)
 Anesthesia Evaluation  Patient identified by MRN, date of birth, ID band Patient awake    Reviewed: Allergy & Precautions, NPO status , Patient's Chart, lab work & pertinent test results  Airway Mallampati: II  TM Distance: <3 FB Neck ROM: Full    Dental  (+) Teeth Intact, Dental Advisory Given, Missing   Pulmonary asthma , COPD, Current Smoker   breath sounds clear to auscultation       Cardiovascular hypertension, Pt. on medications  Rhythm:Regular Rate:Normal  Echo 04/25/2016 - Left ventricle: The cavity size was normal. Wall thickness was    normal. Systolic function was normal. The estimated ejection    fraction was in the range of 55% to 60%. Wall motion was normal;    there were no regional wall motion abnormalities. Doppler    parameters are consistent with abnormal left ventricular    relaxation (grade 1 diastolic dysfunction).  - Aortic valve: There was no stenosis.  - Mitral valve: There was trivial regurgitation.  - Right ventricle: The cavity size was normal. Systolic function    was normal.  - Pulmonary arteries: PA peak pressure: 27 mm Hg (S).  - Inferior vena cava: The vessel was normal in size. The    respirophasic diameter changes were in the normal range (= 50%),    consistent with normal central venous pressure.  - Pericardium, extracardiac: Small circumferential pericardial    effusion, no tamponade.       Neuro/Psych Seizures - (Associated with EtOH use (several years ago)), Well Controlled,  PSYCHIATRIC DISORDERS Anxiety Depression Bipolar Disorder      GI/Hepatic ,GERD  Medicated,,(+)     substance abuse  alcohol use, cocaine use and IV drug use  Endo/Other  Diabetes: Pre-diabetic.    Renal/GU      Musculoskeletal  (+) Arthritis ,  Hx of L4-S1 Fusion   Abdominal   Peds  Hematology   Anesthesia Other Findings   Reproductive/Obstetrics                               Anesthesia Physical Anesthesia Plan  ASA: 3  Anesthesia Plan: General   Post-op Pain Management:    Induction: Intravenous  PONV Risk Score and Plan: 2 and Ondansetron , Dexamethasone , Treatment may vary due to age or medical condition and Midazolam   Airway Management Planned: Oral ETT and Video Laryngoscope Planned  Additional Equipment:   Intra-op Plan:   Post-operative Plan: Extubation in OR  Informed Consent:      Dental advisory given  Plan Discussed with: CRNA and Surgeon  Anesthesia Plan Comments:          Anesthesia Quick Evaluation

## 2024-08-31 NOTE — Progress Notes (Signed)
 Orthopedic Tech Progress Note Patient Details:  Alicia Blake 12-May-1966 [id number]  Ortho Devices Type of Ortho Device: Bone foam zero knee Ortho Device/Splint Location: right Ortho Device/Splint Interventions: Ordered, Application, Adjustment   Post Interventions Patient Tolerated: Well Instructions Provided: Adjustment of device, Care of device  [name] 08/31/2024, 11:41 AM

## 2024-08-31 NOTE — Transfer of Care (Signed)
 Immediate Anesthesia Transfer of Care Note  Patient: Alicia Blake  Procedure(s) Performed: Procedure(s): ARTHROPLASTY, KNEE, TOTAL (Right)  Patient Location: PACU  Anesthesia Type:General and Regional  Level of Consciousness: Patient resting comfortably, ventilating well.   Airway & Oxygen Therapy: Patient spontaneously breathing, ventilating well, oxygen via simple oxygen mask.  Post-op Assessment: Report given to PACU RN, vital signs reviewed and stable, moving all extremities.   Post vital signs: Reviewed and stable.  Complications: No apparent anesthesia complications  Last Vitals:  Vitals Value Taken Time  BP 154/118 08/31/24 09:21  Temp    Pulse 96 08/31/24 09:23  Resp 18 08/31/24 09:23  SpO2 100 % 08/31/24 09:23  Vitals shown include unfiled device data.  Last Pain:  Vitals:   08/31/24 0545  TempSrc:   PainSc: 0-No pain         Complications: No notable events documented.

## 2024-08-31 NOTE — Evaluation (Addendum)
 Physical Therapy Evaluation Patient Details Name: Alicia Blake MRN: [id number] DOB: [birth year] Today's Date: 08/31/2024  History of Present Illness  58 yo female presents to therapy s/p R TKA on 08/31/2024 due to failure of conservative measures. Pt PMH includes but is not limited to: asthma, chronic back pain s/p multiple surgeries, OA, DJD, L ankle fx s/p ORIF, COPD, PTSD, bipolar dz, substance abuse, thyroid dz, seizures, HTN and L THA (2017).  Clinical Impression      TANECIA MCCAY is a 58 y.o. female POD 0 s/p R TKA. Patient reports mod I with SPC for mobility at baseline. Patient is now limited by functional impairments (see PT problem list below) and requires CGA and cues for transfers and gait with RW. Patient was able to ambulate 45 feet x2 with RW and CGA and cues for safe walker management. Patient educated on safe sequencing for stair mobility with B handrails, fall risk prevention, slowly increasing activity, use of RW, CP/ice, bone foam and CMP, pain management and goals, and car transfers pt verbalized understanding. Patient instructed in exercises to facilitate ROM and circulation reviewed and HO provided. Patient will benefit from continued skilled PT interventions to address impairments and progress towards PLOF. Patient has met mobility goals at adequate level for discharge home with family support and OPPT services; will continue to follow if pt continues acute stay to progress towards Mod I goals.     If plan is discharge home, recommend the following: A little help with walking and/or transfers;A little help with bathing/dressing/bathroom;Assistance with cooking/housework;Assist for transportation;Help with stairs or ramp for entrance   Can travel by private vehicle        Equipment Recommendations None recommended by PT  Recommendations for Other Services       Functional Status Assessment Patient has had a recent decline in their functional status and demonstrates the  ability to make significant improvements in function in a reasonable and predictable amount of time.     Precautions / Restrictions Precautions Precautions: Fall;Knee Restrictions Weight Bearing Restrictions Per Provider Order: No      Mobility  Bed Mobility Overal bed mobility: Needs Assistance Bed Mobility: Supine to Sit     Supine to sit: Supervision     General bed mobility comments: min cues    Transfers Overall transfer level: Needs assistance Equipment used: Rolling walker (2 wheels) Transfers: Sit to/from Stand             General transfer comment: min cues for safety and RW and maintaing R LE inside RW    Ambulation/Gait Ambulation/Gait assistance: Contact guard assist Gait Distance (Feet): 50 Feet Assistive device: Rolling walker (2 wheels) Gait Pattern/deviations: Step-to pattern, Decreased stance time - right, Antalgic, Trunk flexed Gait velocity: decreased     General Gait Details: slight trunk flexion with B UE support at RW to offload R LE in stance phase, min cues for safety, RW management, posture and proper body position inside RW, pt noted to have R Toe out  Stairs Stairs: Yes Stairs assistance: Contact guard assist Stair Management: Two rails Number of Stairs: 3 General stair comments: min cues for safety, sequencing and technique with step to pattern  Wheelchair Mobility     Tilt Bed    Modified Rankin (Stroke Patients Only)       Balance Overall balance assessment: Needs assistance Sitting-balance support: Feet supported Sitting balance-Leahy Scale: Good     Standing balance support: Bilateral upper extremity supported, During functional activity, Rel[place]  on assistive device for balance Standing balance-Leahy Scale: Fair Standing balance comment: static standing no UE support at RW and no overt LOB                             Pertinent Vitals/Pain Pain Assessment Pain Assessment: 0-10 Pain Score: 7  Pain  Location: R knee and LE and LBP Pain Descriptors / Indicators: Aching, Constant, Discomfort, Dull, Operative site guarding Pain Intervention(s): Limited activity within patient's tolerance, Monitored during session, Premedicated before session, Repositioned, Ice applied    Home Living Family/patient expects to be discharged to:: Private residence Living Arrangements: Alone Available Help at Discharge: Family Type of Home: Apartment Home Access: Stairs to enter Entrance Stairs-Rails: Right;Left;Can reach both Entrance Stairs-Number of Steps: 4 Alternate Level Stairs-Number of Steps: plans to stay downstairs Home Layout: Two level;Able to live on main level with bedroom/bathroom Home Equipment: Rolling Walker (2 wheels);Cane - single point;BSC/3in1;Rollator (4 wheels)      Prior Function Prior Level of Function : Independent/Modified Independent;Driving             Mobility Comments: SPC for all  mobility tasks, mod I for ADLs, self care tasks and IADLS       Extremity/Trunk Assessment        Lower Extremity Assessment Lower Extremity Assessment: RLE deficits/detail RLE Deficits / Details: ankle DF/PF 5/5; SLR < 10 degree lag RLE Sensation: WNL    Cervical / Trunk Assessment Cervical / Trunk Assessment: Back Surgery  Communication   Communication Communication: No apparent difficulties    Cognition Arousal: Alert Behavior During Therapy: WFL for tasks assessed/performed   PT - Cognitive impairments: No apparent impairments                         Following commands: Intact       Cueing       General Comments      Exercises Total Joint Exercises Ankle Circles/Pumps: AROM, Both, 10 reps Quad Sets: AROM, 5 reps, Right Short Arc Quad: AROM, 5 reps, Right Heel Slides: AROM, 5 reps, Right Hip ABduction/ADduction: AROM, 5 reps, Right Straight Leg Raises: AROM, 5 reps, Right Knee Flexion: AROM, 5 reps, Seated, Right   Assessment/Plan    PT  Assessment Patient needs continued PT services  PT Problem List Decreased strength;Decreased range of motion;Decreased activity tolerance;Decreased balance;Decreased mobility;Pain       PT Treatment Interventions DME instruction;Gait training;Stair training;Functional mobility training;Therapeutic activities;Balance training;Therapeutic exercise;Neuromuscular re-education;Patient/family education;Modalities    PT Goals (Current goals can be found in the Care Plan section)  Acute Rehab PT Goals Patient Stated Goal: to be able to power walk for exercise and loose more weight PT Goal Formulation: With patient Time For Goal Achievement: 09/14/24 Potential to Achieve Goals: Good    Frequency 7X/week     Co-evaluation               AM-PAC PT 6 Clicks Mobility  Outcome Measure Help needed turning from your back to your side while in a flat bed without using bedrails?: None Help needed moving from lying on your back to sitting on the side of a flat bed without using bedrails?: A Little Help needed moving to and from a bed to a chair (including a wheelchair)?: A Little Help needed standing up from a chair using your arms (e.g., wheelchair or bedside chair)?: A Little Help needed to walk in hospital room?: A  Little Help needed climbing 3-5 steps with a railing? : A Little 6 Click Score: 19    End of Session Equipment Utilized During Treatment: Gait belt Activity Tolerance: Patient tolerated treatment well;No increased pain Patient left: in chair;with call bell/phone within reach Nurse Communication: Mobility status;Other (comment) (pt readiness for same day d/c from PT standpoint) PT Visit Diagnosis: Unsteadiness on feet (R26.81);Other abnormalities of gait and mobility (R26.89);Muscle weakness (generalized) (M62.81);Difficulty in walking, not elsewhere classified (R26.2);Pain Pain - Right/Left: Right Pain - part of body: Knee;Leg (back)    Time: 8748-8661 PT Time Calculation  (min) (ACUTE ONLY): 47 min   Charges:   PT Evaluation $PT Eval Low Complexity: 1 Low PT Treatments $Gait Training: 8-22 mins $Therapeutic Exercise: 8-22 mins PT General Charges $$ ACUTE PT VISIT: 1 Visit         [place], PT Acute Rehab   [place] VEAR Drone 08/31/2024, 2:59 PM

## 2024-08-31 NOTE — Interval H&P Note (Signed)
 History and Physical Interval Note:  08/31/2024 7:10 AM  Alicia Blake  has presented today for surgery, with the diagnosis of OA RIGHT KNEE.  The various methods of treatment have been discussed with the patient and family. After consideration of risks, benefits and other options for treatment, the patient has consented to  Procedure(s): ARTHROPLASTY, KNEE, TOTAL (Right) as a surgical intervention.  The patient's history has been reviewed, patient examined, no change in status, stable for surgery.  I have reviewed the patient's chart and labs.  Questions were answered to the patient's satisfaction.     [name]

## 2024-08-31 NOTE — Op Note (Signed)
 DATE OF SURGERY:  08/31/2024 TIME: 8:51 AM  PATIENT NAME:  Alicia Blake   AGE: 58 y.o.    PRE-OPERATIVE DIAGNOSIS:  OA RIGHT KNEE  POST-OPERATIVE DIAGNOSIS:  Same  PROCEDURE:  Procedure(s): ARTHROPLASTY, KNEE, TOTAL   SURGEON:  [name], MD   ASSISTANT:  Gerard Large, PA-C, she was present and scrubbed throughout the case, critical for completion in a timely fashion, and for retraction, instrumentation, and closure.    OPERATIVE IMPLANTS: [employer] Triathlon CR. Press fit knee  Femur size 4, Tibia size 5, Patella size 32 3-peg oval button, with a 9 mm polyethylene insert.   PREOPERATIVE INDICATIONS:  Alicia Blake is a 58 y.o. year old female with end stage bone on bone degenerative arthritis of the knee who failed conservative treatment, including injections, antiinflammatories, activity modification, and assistive devices, and had significant impairment of their activities of daily living, and elected for Total Knee Arthroplasty.   The risks, benefits, and alternatives were discussed at length including but not limited to the risks of infection, bleeding, nerve injury, stiffness, blood clots, the need for revision surgery, cardiopulmonary complications, among others, and they were willing to proceed.   OPERATIVE DESCRIPTION:  The patient was brought to the operative room and placed in a supine position.  General anesthesia was administered.  IV antibiotics were given.  The lower extremity was prepped and draped in the usual sterile fashion.  Time out was performed.  The leg was elevated and exsanguinated and the tourniquet was inflated.  Anterior approach was performed.  The patella was everted and osteophytes were removed.  The anterior horn of the medial and lateral meniscus was removed.   The distal femur was opened with the drill and the intramedullary distal femoral cutting jig was utilized, set at 5 degrees resecting 9 mm off the distal femur.  Care was taken to  protect the collateral ligaments.  The distal femoral sizing jig was applied, taking care to avoid notching.  Then the 4-in-1 cutting jig was applied and the anterior and posterior femur was cut, along with the chamfer cuts.  All posterior osteophytes were removed.  The flexion gap was then measured and was symmetric with the extension gap.  Then the extramedullary tibial cutting jig was utilized making the appropriate cut using the anterior tibial crest as a reference building in appropriate posterior slope.  Care was taken during the cut to protect the medial and collateral ligaments.  The proximal tibia was removed along with the posterior horns of the menisci.    I completed the distal femoral preparation using the appropriate jig to prepare the box.  The patella was then measured, and cut with the saw.    The proximal tibia sized and prepared accordingly with the reamer and the punch, and then all components were trialed with the above sized poly insert.  The knee was found to have excellent balance and full motion.    The above named components were then impacted into place and Poly tibial piece and patella were inserted.  I was very happy with his stability and ROM  I performed a periarticular injection with Exparel   The knee was easily taken through a range of motion and the patella tracked well and the knee irrigated copiously and the parapatellar and subcutaneous tissue closed with vicryl, and monocryl with steri strips for the skin.  The incision was dressed with sterile gauze and the tourniquet released and the patient was awakened and returned to the  PACU in stable and satisfactory condition.  There were no complications.  Total tourniquet time was roughly 60 minutes.   POSTOPERATIVE PLAN: post op Abx, DVT px: SCD's, TED's, Early ambulation and chemical px

## 2024-08-31 NOTE — Anesthesia Procedure Notes (Signed)
 Procedure Name: Intubation Date/Time: 08/31/2024 7:40 AM  Performed by: Joshua Vernell BROCKS, CRNAPre-anesthesia Checklist: Patient identified, Emergency Drugs available, Suction available and Patient being monitored Patient Re-evaluated:Patient Re-evaluated prior to induction Oxygen Delivery Method: Circle system utilized Preoxygenation: Pre-oxygenation with 100% oxygen Induction Type: IV induction Ventilation: Mask ventilation without difficulty Laryngoscope Size: Glidescope and 3 Grade View: Grade I Tube type: Oral Tube size: 7.0 mm Number of attempts: 1 Airway Equipment and Method: Stylet and Video-laryngoscopy Placement Confirmation: ETT inserted through vocal cords under direct vision, positive ETCO2 and breath sounds checked- equal and bilateral Secured at: 21 cm Tube secured with: Tape Dental Injury: Teeth and Oropharynx as per pre-operative assessment  Comments: Glidescope used because of previous glidescope use. Easy intubation with glidescope

## 2024-08-31 NOTE — Anesthesia Postprocedure Evaluation (Signed)
 Anesthesia Post Note  Patient: Alicia Blake  Procedure(s) Performed: ARTHROPLASTY, KNEE, TOTAL (Right: Knee)     Patient location during evaluation: PACU Anesthesia Type: General Level of consciousness: awake Pain management: pain level controlled Vital Signs Assessment: post-procedure vital signs reviewed and stable Respiratory status: spontaneous breathing Cardiovascular status: blood pressure returned to baseline Postop Assessment: no apparent nausea or vomiting Anesthetic complications: no   No notable events documented.  Last Vitals:  Vitals:   08/31/24 1045 08/31/24 1115  BP: 136/79 (!) 147/93  Pulse: 90 94  Resp: 15 16  Temp:  36.7 C  SpO2: 94% 94%                    Lauraine KATHEE [place]

## 2024-09-01 ENCOUNTER — Encounter (HOSPITAL_COMMUNITY): Payer: Self-pay | Admitting: Orthopedic Surgery

## 2024-09-03 ENCOUNTER — Ambulatory Visit: Attending: Orthopedic Surgery

## 2024-09-03 ENCOUNTER — Other Ambulatory Visit: Payer: Self-pay

## 2024-09-03 DIAGNOSIS — M6281 Muscle weakness (generalized): Secondary | ICD-10-CM | POA: Diagnosis present

## 2024-09-03 DIAGNOSIS — R2689 Other abnormalities of gait and mobility: Secondary | ICD-10-CM | POA: Insufficient documentation

## 2024-09-03 DIAGNOSIS — M25561 Pain in right knee: Secondary | ICD-10-CM | POA: Diagnosis present

## 2024-09-03 NOTE — Therapy (Signed)
 OUTPATIENT PHYSICAL THERAPY LOWER EXTREMITY EVALUATION   Patient Name: Alicia Blake MRN: [id number] DOB:[birth year], 58 y.o., female Today's Date: 09/03/2024  END OF SESSION:  PT End of Session - 09/03/24 1138     Visit Number 1    Number of Visits 24    Date for Recertification  11/26/24    Authorization Type [employer]    PT Start Time 1045    PT Stop Time 1130    PT Time Calculation (min) 45 min    Activity Tolerance Patient limited by pain;Patient limited by lethargy;Other (comment)    Behavior During Therapy WFL for tasks assessed/performed          Past Medical History:  Diagnosis Date   Asthma    Bipolar affect, depressed (HCC)    Bronchitis    Chronic back pain    Closed left ankle fracture    COPD (chronic obstructive pulmonary disease) (HCC)    DJD (degenerative joint disease)    BACK   GERD (gastroesophageal reflux disease)    Hypertension    Pneumonia    Pre-diabetes    dr entered diabetes without complications- no meds does not test sugar at home   PTSD (post-traumatic stress disorder)    Scoliosis    Seizures (HCC)    when drinking last yrs ago   Shortness of breath dyspnea    exersion   Substance abuse (HCC)    crack cocaine, 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, none since   Thyroid disease    UTI (lower urinary tract infection)    Past Surgical History:  Procedure Laterality Date   2 LEFT TOES SURGERY  12/2014   finished only on right side   ABDOMINAL EXPOSURE N/A 03/26/2023   Procedure: ABDOMINAL EXPOSURE;  Surgeon: Gretta Lonni PARAS, MD;  Location: [facility] OR;  Service: Vascular;  Laterality: N/A;   ABDOMINAL EXPOSURE N/A 04/02/2023   Procedure: ABDOMINAL EXPOSURE;  Surgeon: Gretta Lonni PARAS, MD;  Location: [facility] OR;  Service: Vascular;  Laterality: N/A;   ANTERIOR LUMBAR FUSION N/A 03/26/2023   Procedure: REMOVAL OF POSTERIOR HARDWARE;  Surgeon: Beuford Anes, MD;  Location: [facility] OR;  Service: Orthopedics;  Laterality: N/A;   ANTERIOR LUMBAR  FUSION N/A 03/26/2023   Procedure: LUMBAR FOUR - LUMBAR FIVE, LUMBAR FIVE - SACRUM ONE ANTERIOR LUMBAR INTERBODY FUSION WITH INSTRUMENTATION AND ALLOGRAFT;  Surgeon: Beuford Anes, MD;  Location: [facility] OR;  Service: Orthopedics;  Laterality: N/A;   ANTERIOR LUMBAR FUSION N/A 04/02/2023   Procedure: REVISION ANTERIOR LUMBAR FUSION L5-S1;  Surgeon: Beuford Anes, MD;  Location: [facility] OR;  Service: Orthopedics;  Laterality: N/A;   BACK SURGERY     BALLOON DILATION N/A 12/14/2015   Procedure: BALLOON DILATION;  Surgeon: Norleen Hint, MD;  Location: [facility];  Service: Endoscopy;  Laterality: N/A;   COLONOSCOPY WITH PROPOFOL  N/A 12/14/2015   Procedure: COLONOSCOPY WITH PROPOFOL ;  Surgeon: Norleen Hint, MD;  Location: [facility];  Service: Endoscopy;  Laterality: N/A;   ESOPHAGOGASTRODUODENOSCOPY (EGD) WITH PROPOFOL  N/A 12/14/2015   Procedure: ESOPHAGOGASTRODUODENOSCOPY (EGD) WITH PROPOFOL ;  Surgeon: Norleen Hint, MD;  Location: [facility];  Service: Endoscopy;  Laterality: N/A;   FRACTURE SURGERY     JOINT REPLACEMENT     left hip   LEFT ANKLE SURGERY  12/2014   LUMBAR LAMINECTOMY/DECOMPRESSION MICRODISCECTOMY N/A 06/18/2017   Procedure: LAMINECTOMY AND FORAMINOTOMY LUMBAR FOUR - LUMBAR FIVE;  Surgeon: Gillie Duncans, MD;  Location: [facility] OR;  Service: Neurosurgery;  Laterality: N/A;  LAMINECTOMY AND FORAMINOTOMY LUMBAR 4-  LUMBAR 5   LUMBAR WOUND DEBRIDEMENT N/A 03/10/2020   Procedure: Lumbar Wound Debridement;  Surgeon: Gillie Duncans, MD;  Location: [facility] OR;  Service: Neurosurgery;  Laterality: N/A;  posterior   NECK SURGERY  AS TEENAGER   STITCHES TO NECK    ORIF ANKLE FRACTURE Left 07/24/2021   Procedure: OPEN REDUCTION INTERNAL FIXATION (ORIF) ANKLE FRACTURE;  Surgeon: Beverley Evalene BIRCH, MD;  Location: [facility];  Service: Orthopedics;  Laterality: Left;   SURGERY FOR CUT ON STOMACH  TEENAGER   TOTAL HIP ARTHROPLASTY Left 05/14/2016   Procedure: TOTAL HIP ARTHROPLASTY ANTERIOR APPROACH;   Surgeon: Evalene BIRCH Beverley, MD;  Location: [facility] OR;  Service: Orthopedics;  Laterality: Left;   TOTAL KNEE ARTHROPLASTY Right 08/31/2024   Procedure: ARTHROPLASTY, KNEE, TOTAL;  Surgeon: Beverley Evalene BIRCH, MD;  Location: [facility] ORS;  Service: Orthopedics;  Laterality: Right;   Patient Active Problem List   Diagnosis Date Noted   S/P total knee arthroplasty, right 08/31/2024   Radiculopathy, lumbar region 03/26/2023   Acid reflux 11/07/2022   Lumbar foraminal stenosis 04/21/2020   Tongue lesion 03/04/2019   Macromastia 08/11/2018   Laryngopharyngeal reflux (LPR) 12/16/2017   Pharyngeal dysphagia 12/16/2017   Right ear impacted cerumen 12/16/2017   Lumbar stenosis with neurogenic claudication 06/18/2017   Bilateral sensorineural hearing loss 10/14/2016   Subjective tinnitus, bilateral 10/14/2016   Tobacco abuse 04/19/2016   Primary osteoarthritis of left hip 04/18/2016   Opioid use disorder, mild, abuse (HCC) 09/08/2015   Schizoaffective disorder, bipolar type (HCC) 09/07/2015   Chronic pain syndrome 09/07/2015   Alcohol use disorder, moderate, in sustained remission (HCC) 09/07/2015   Cocaine use disorder, moderate, in sustained remission (HCC) 09/07/2015   Cannabis use disorder, moderate, in sustained remission (HCC) 09/07/2015   DDD (degenerative disc disease), lumbar 09/07/2015   Hx of fracture of ankle 09/07/2015   Arthropathy of ankle and foot 03/04/2014   Hammertoe 03/04/2014   Cystocele, unspecified 09/03/2013   Symptomatic Fibroids 08/09/2013   Breast lump on left side at 9 o'clock position 06/29/2013   Breast discharge 06/29/2013   Closed disp fx of lateral condyle of left tibia with routine healing 11/12/2012   Asthma 08/13/2011   History of gastroesophageal reflux (GERD) 08/13/2011   Hyperglycemia 08/13/2011   Post-traumatic stress disorder, unspecified 08/13/2011    PCP: Shelda Atlas, MD  REFERRING PROVIDER: Beverley Evalene BIRCH, MD  REFERRING DIAG: [phone] (ICD-10-CM)  - Presence of right artificial knee joint   THERAPY DIAG:  Acute pain of right knee  Muscle weakness (generalized)  Other abnormalities of gait and mobility  Rationale for Evaluation and Treatment: Rehabilitation  ONSET DATE: 08/31/2024  SUBJECTIVE:   SUBJECTIVE STATEMENT: Pt presents to PT s/p physical therapy evaluation and treatment for R TKA performed by Dr. Beverley on 08/31/2024. On arrival and start of evaluation she notes that she is feeling very dizzy while walking back to gym. We checked her BP which was very low, she states that she has not eaten since noon the previous day. Has had a lot of pain in R knee post surgery, difficult getting around her house and her family is there to help her.   PERTINENT HISTORY: L THA, lumbar fusion, HTN  PAIN:  Are you having pain?  Yes: NPRS scale: 9/10 Worst: 10/10 Pain location: R anterior knee Pain description: sharp, sore Aggravating factors: transfers, standing, bending Relieving factors: medication   PRECAUTIONS: None  RED FLAGS: None   WEIGHT BEARING RESTRICTIONS: Yes WBAT  FALLS:  Has patient fallen in last 6 months? No  LIVING ENVIRONMENT: Lives with: lives alone Lives in: House/apartment Stairs: Yes: External: 3 steps; bilateral but cannot reach both Has following equipment at home: Single point cane and Walker - 2 wheeled  OCCUPATION: Not working   PLOF: Independent  PATIENT GOALS: improve knee motion, get back to walking recreationally, improve knee ROM  NEXT MD VISIT: 09/15/2024  OBJECTIVE:  Note: Objective measures were completed at Evaluation unless otherwise noted.  DIAGNOSTIC FINDINGS: See imaging   PATIENT SURVEYS:  LEFS  Extreme difficulty/unable (0), Quite a bit of difficulty (1), Moderate difficulty (2), Little difficulty (3), No difficulty (4) Survey date:  09/03/2024  Any of your usual work, housework or school activities 0  2. Usual hobbies, recreational or sporting activities 0  3.  Getting into/out of the bath 0  4. Walking between rooms 0  5. Putting on socks/shoes 0  6. Squatting  0  7. Lifting an object, like a bag of groceries from the floor 0  8. Performing light activities around your home 0  9. Performing heavy activities around your home 0  10. Getting into/out of a car 0  11. Walking 2 blocks 0  12. Walking 1 mile 0  13. Going up/down 10 stairs (1 flight) 0  14. Standing for 1 hour 0  15.  sitting for 1 hour 0  16. Running on even ground 0  17. Running on uneven ground 0  18. Making sharp turns while running fast 0  19. Hopping  0  20. Rolling over in bed 0  Score total:  0/80     COGNITION: Overall cognitive status: Within functional limits for tasks assessed     SENSATION: Light touch: Impaired - lateral R knee  POSTURE: rounded shoulders and forward head and R LE externally rotated   PALPATION: TTP to distal R quad  LOWER EXTREMITY ROM:  Active ROM Right eval Left eval  Knee flexion 75 120  Knee extension 17 0   (Blank rows = not tested)  LOWER EXTREMITY MMT:  MMT Right eval Left eval  Hip flexion    Hip extension    Hip abduction    Hip adduction    Hip internal rotation    Hip external rotation    Knee flexion DNT   Knee extension DNT   Ankle dorsiflexion    Ankle plantarflexion    Ankle inversion    Ankle eversion     (Blank rows = not tested)  LOWER EXTREMITY SPECIAL TESTS:  DNT  FUNCTIONAL TESTS:  30 Second Sit to Stand: deferred due to BP  GAIT: Distance walked: 63ft Assistive device utilized: Environmental Consultant - 2 wheeled Level of assistance: CGA Comments: R LE out-toeing, antalgic gait R, decreased gait speed  VITALS: Sitting left arm:  1st: 92/55  2nd: 92/58  TREATMENT: [facility] Adult PT Treatment:                                                DATE: 09/03/2024 Therapeutic Exercise: Supine ankle pumps x 20 R Seated QS x 5 - 5 hold Seated heel slide x 5 - 5 hold R Seated hamstring stretch x 30  R Modalites: Ice to R knee in sitting  PATIENT EDUCATION:  Education details: eval findings, LEFS, HEP, POC Person educated: Patient Education method: Explanation, Demonstration, and Handouts  Education comprehension: verbalized understanding and returned demonstration  HOME EXERCISE PROGRAM: Access Code: [email or web address] URL: [email or web address] Date: 09/03/2024 Prepared by: Alm Kingdom  Exercises - Supine Ankle Pumps  - 8 x daily - 7 x weekly - 2 sets - 20-30 reps - Long Sitting Quad Set  - 4-5 x daily - 7 x weekly - 3 sets - 10 reps - 5 sec hold - Seated Heel Slide  - 4-5 x daily - 7 x weekly - 2-3 sets - 10 reps - 5 sec hold - Seated Hamstring Stretch  - 4-5 x daily - 7 x weekly - 2-3 reps - 30 sec hold  ASSESSMENT:  CLINICAL IMPRESSION: Patient is a 58 y.o. F who was seen today s/p physical therapy evaluation and treatment for R TKA performed by Dr. Beverley on 08/31/2024. Physical findings are consistent with surgery and recovery timeline as pt demonstrates decreased R knee ROM, strength, and functional mobility. Today we could not assess or treatment very much due to symptoms of dizziness and orthostasis, measured BP twice which was very low and had no symptom resolvement with gingerale and simple sugars. PT assisted pt back to car via w/c, she refused ED transport and notes that she needs to eat something. LEFS shows shows severe disability in performance of home ADLs and community activities. Pt would benefit from skilled PT services working on improving knee ROM, strength, and gait post TKA. Will assess vitals and symptoms of orthostasis next session.   OBJECTIVE IMPAIRMENTS: Abnormal gait, decreased activity tolerance, decreased balance, decreased endurance, decreased mobility, difficulty walking, decreased ROM, decreased strength, and pain.   ACTIVITY LIMITATIONS: carrying, lifting, bending, sitting, standing, squatting, stairs, transfers, bed mobility, and locomotion  level  PARTICIPATION LIMITATIONS: meal prep, cleaning, driving, shopping, community activity, occupation, yard work, and school  PERSONAL FACTORS: Fitness and 3+ comorbidities: L THA, lumbar fusion, HTN are also affecting patient's functional outcome.   REHAB POTENTIAL: Good  CLINICAL DECISION MAKING: Evolving/moderate complexity  EVALUATION COMPLEXITY: Moderate   GOALS: Goals reviewed with patient? No  SHORT TERM GOALS: Target date: 09/24/2024   Pt will be compliant and knowledgeable with initial HEP for improved comfort and carryover Baseline: initial HEP given  Goal status: INITIAL  2.  Pt will self report right knee pain no greater than 8/10 for improved comfort and functional ability Baseline: 10/10 at worst Goal status: INITIAL   LONG TERM GOALS: Target date: 11/26/2024   Pt will improve LEFS to no less than 30/80 as proxy for functional improvement with home ADLs and higher level community activity Baseline: 0/80 Goal status: INITIAL   2.  Pt will self report right knee pain no greater than 6/10 for improved comfort and functional ability Baseline: 10/10 at worst Goal status: INITIAL   3.  Pt will increase 30 Second Sit to Stand rep count by no less than 2 reps for improved balance, strength, and functional mobility Baseline: deferred due to BP  Goal status: INITIAL   4.  Pt will improve R knee AROM to at least range of 3-115 degrees for improved comfort and functional mobility Baseline: 17-75 Goal status: INITIAL  5.  Pt will be able to walk recreationally with no increase in R knee pain for improved comfort and return to desired recreational activity Baseline:  Goal status: INITIAL   PLAN:  PT FREQUENCY: 2x/week  PT DURATION: 12 weeks  PLANNED INTERVENTIONS: 97164- PT Re-evaluation, 97110-Therapeutic exercises, 97530- Therapeutic activity, [id number]- Neuromuscular re-education, 97535- Self Care, 02859- Manual therapy, 02883-  Gait training, [phone]- Electrical  stimulation (unattended), [id number]- Electrical stimulation (manual), 02983- Vasopneumatic device, and Patient/Family education  PLAN FOR NEXT SESSION: assess BP, HEP, R knee ROM and gait, quad strength  For all possible CPT codes, reference the Planned Interventions line above.     Check all conditions that are expected to impact treatment: {Conditions expected to impact treatment:Musculoskeletal disorders   If treatment provided at initial evaluation, no treatment charged due to lack of authorization.       Alm JAYSON Kingdom, PT 09/03/2024, 11:39 AM

## 2024-09-07 ENCOUNTER — Ambulatory Visit

## 2024-09-07 DIAGNOSIS — M25561 Pain in right knee: Secondary | ICD-10-CM

## 2024-09-07 DIAGNOSIS — M6281 Muscle weakness (generalized): Secondary | ICD-10-CM

## 2024-09-07 DIAGNOSIS — R2689 Other abnormalities of gait and mobility: Secondary | ICD-10-CM

## 2024-09-07 LAB — NICOTINE/COTININE METABOLITES
Cotinine: 316.1 ng/mL
Nicotine: 16.8 ng/mL

## 2024-09-07 NOTE — Therapy (Signed)
 OUTPATIENT PHYSICAL THERAPY TREATMENT   Patient Name: Alicia Blake MRN: [id number] DOB:Oct 18, 1966, 58 y.o., female Today's Date: 09/07/2024  END OF SESSION:  PT End of Session - 09/07/24 1041     Visit Number 2    Number of Visits 24    Date for Recertification  11/26/24    Authorization Type [employer]    PT Start Time 1100    PT Stop Time 1140    PT Time Calculation (min) 40 min    Activity Tolerance Patient limited by pain;Patient limited by lethargy;Other (comment)    Behavior During Therapy WFL for tasks assessed/performed           Past Medical History:  Diagnosis Date   Asthma    Bipolar affect, depressed (HCC)    Bronchitis    Chronic back pain    Closed left ankle fracture    COPD (chronic obstructive pulmonary disease) (HCC)    DJD (degenerative joint disease)    BACK   GERD (gastroesophageal reflux disease)    Hypertension    Pneumonia    Pre-diabetes    dr entered diabetes without complications- no meds does not test sugar at home   PTSD (post-traumatic stress disorder)    Scoliosis    Seizures (HCC)    when drinking last yrs ago   Shortness of breath dyspnea    exersion   Substance abuse (HCC)    crack cocaine, 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, none since   Thyroid disease    UTI (lower urinary tract infection)    Past Surgical History:  Procedure Laterality Date   2 LEFT TOES SURGERY  12/2014   finished only on right side   ABDOMINAL EXPOSURE N/A 03/26/2023   Procedure: ABDOMINAL EXPOSURE;  Surgeon: Gretta Lonni PARAS, MD;  Location: [facility] OR;  Service: Vascular;  Laterality: N/A;   ABDOMINAL EXPOSURE N/A 04/02/2023   Procedure: ABDOMINAL EXPOSURE;  Surgeon: Gretta Lonni PARAS, MD;  Location: [facility] OR;  Service: Vascular;  Laterality: N/A;   ANTERIOR LUMBAR FUSION N/A 03/26/2023   Procedure: REMOVAL OF POSTERIOR HARDWARE;  Surgeon: Beuford Anes, MD;  Location: [facility] OR;  Service: Orthopedics;  Laterality: N/A;   ANTERIOR LUMBAR FUSION N/A  03/26/2023   Procedure: LUMBAR FOUR - LUMBAR FIVE, LUMBAR FIVE - SACRUM ONE ANTERIOR LUMBAR INTERBODY FUSION WITH INSTRUMENTATION AND ALLOGRAFT;  Surgeon: Beuford Anes, MD;  Location: [facility] OR;  Service: Orthopedics;  Laterality: N/A;   ANTERIOR LUMBAR FUSION N/A 04/02/2023   Procedure: REVISION ANTERIOR LUMBAR FUSION L5-S1;  Surgeon: Beuford Anes, MD;  Location: [facility] OR;  Service: Orthopedics;  Laterality: N/A;   BACK SURGERY     BALLOON DILATION N/A 12/14/2015   Procedure: BALLOON DILATION;  Surgeon: Norleen Hint, MD;  Location: [facility];  Service: Endoscopy;  Laterality: N/A;   COLONOSCOPY WITH PROPOFOL  N/A 12/14/2015   Procedure: COLONOSCOPY WITH PROPOFOL ;  Surgeon: Norleen Hint, MD;  Location: [facility];  Service: Endoscopy;  Laterality: N/A;   ESOPHAGOGASTRODUODENOSCOPY (EGD) WITH PROPOFOL  N/A 12/14/2015   Procedure: ESOPHAGOGASTRODUODENOSCOPY (EGD) WITH PROPOFOL ;  Surgeon: Norleen Hint, MD;  Location: [facility];  Service: Endoscopy;  Laterality: N/A;   FRACTURE SURGERY     JOINT REPLACEMENT     left hip   LEFT ANKLE SURGERY  12/2014   LUMBAR LAMINECTOMY/DECOMPRESSION MICRODISCECTOMY N/A 06/18/2017   Procedure: LAMINECTOMY AND FORAMINOTOMY LUMBAR FOUR - LUMBAR FIVE;  Surgeon: Gillie Duncans, MD;  Location: [facility] OR;  Service: Neurosurgery;  Laterality: N/A;  LAMINECTOMY AND FORAMINOTOMY LUMBAR 4- LUMBAR  5   LUMBAR WOUND DEBRIDEMENT N/A 03/10/2020   Procedure: Lumbar Wound Debridement;  Surgeon: Gillie Duncans, MD;  Location: [facility] OR;  Service: Neurosurgery;  Laterality: N/A;  posterior   NECK SURGERY  AS TEENAGER   STITCHES TO NECK    ORIF ANKLE FRACTURE Left 07/24/2021   Procedure: OPEN REDUCTION INTERNAL FIXATION (ORIF) ANKLE FRACTURE;  Surgeon: Beverley Evalene BIRCH, MD;  Location: [facility];  Service: Orthopedics;  Laterality: Left;   SURGERY FOR CUT ON STOMACH  TEENAGER   TOTAL HIP ARTHROPLASTY Left 05/14/2016   Procedure: TOTAL HIP ARTHROPLASTY ANTERIOR APPROACH;  Surgeon:  Evalene BIRCH Beverley, MD;  Location: [facility] OR;  Service: Orthopedics;  Laterality: Left;   TOTAL KNEE ARTHROPLASTY Right 08/31/2024   Procedure: ARTHROPLASTY, KNEE, TOTAL;  Surgeon: Beverley Evalene BIRCH, MD;  Location: [facility] ORS;  Service: Orthopedics;  Laterality: Right;   Patient Active Problem List   Diagnosis Date Noted   S/P total knee arthroplasty, right 08/31/2024   Radiculopathy, lumbar region 03/26/2023   Acid reflux 11/07/2022   Lumbar foraminal stenosis 04/21/2020   Tongue lesion 03/04/2019   Macromastia 08/11/2018   Laryngopharyngeal reflux (LPR) 12/16/2017   Pharyngeal dysphagia 12/16/2017   Right ear impacted cerumen 12/16/2017   Lumbar stenosis with neurogenic claudication 06/18/2017   Bilateral sensorineural hearing loss 10/14/2016   Subjective tinnitus, bilateral 10/14/2016   Tobacco abuse 04/19/2016   Primary osteoarthritis of left hip 04/18/2016   Opioid use disorder, mild, abuse (HCC) 09/08/2015   Schizoaffective disorder, bipolar type (HCC) 09/07/2015   Chronic pain syndrome 09/07/2015   Alcohol use disorder, moderate, in sustained remission (HCC) 09/07/2015   Cocaine use disorder, moderate, in sustained remission (HCC) 09/07/2015   Cannabis use disorder, moderate, in sustained remission (HCC) 09/07/2015   DDD (degenerative disc disease), lumbar 09/07/2015   Hx of fracture of ankle 09/07/2015   Arthropathy of ankle and foot 03/04/2014   Hammertoe 03/04/2014   Cystocele, unspecified 09/03/2013   Symptomatic Fibroids 08/09/2013   Breast lump on left side at 9 o'clock position 06/29/2013   Breast discharge 06/29/2013   Closed disp fx of lateral condyle of left tibia with routine healing 11/12/2012   Asthma 08/13/2011   History of gastroesophageal reflux (GERD) 08/13/2011   Hyperglycemia 08/13/2011   Post-traumatic stress disorder, unspecified 08/13/2011    PCP: Shelda Atlas, MD  REFERRING PROVIDER: Beverley Evalene BIRCH, MD  REFERRING DIAG: [phone] (ICD-10-CM) -  Presence of right artificial knee joint   THERAPY DIAG:  Acute pain of right knee  Muscle weakness (generalized)  Other abnormalities of gait and mobility  Rationale for Evaluation and Treatment: Rehabilitation  ONSET DATE: 08/31/2024  SUBJECTIVE:   SUBJECTIVE STATEMENT: Pt presents to PT with reports of 9/10 pain in R knee. Has a large amount of dried blood underneath bandage. Not feeling dizzy today.   EVAL: Pt presents to PT s/p physical therapy evaluation and treatment for R TKA performed by Dr. Beverley on 08/31/2024. On arrival and start of evaluation she notes that she is feeling very dizzy while walking back to gym. We checked her BP which was very low, she states that she has not eaten since noon the previous day. Has had a lot of pain in R knee post surgery, difficult getting around her house and her family is there to help her.   PERTINENT HISTORY: L THA, lumbar fusion, HTN  PAIN:  Are you having pain?  Yes: NPRS scale: 9/10 Worst: 10/10 Pain location: R anterior knee Pain description:  sharp, sore Aggravating factors: transfers, standing, bending Relieving factors: medication   PRECAUTIONS: None  RED FLAGS: None   WEIGHT BEARING RESTRICTIONS: Yes WBAT  FALLS:  Has patient fallen in last 6 months? No  LIVING ENVIRONMENT: Lives with: lives alone Lives in: House/apartment Stairs: Yes: External: 3 steps; bilateral but cannot reach both Has following equipment at home: Single point cane and Walker - 2 wheeled  OCCUPATION: Not working   PLOF: Independent  PATIENT GOALS: improve knee motion, get back to walking recreationally, improve knee ROM  NEXT MD VISIT: 09/15/2024  OBJECTIVE:  Note: Objective measures were completed at Evaluation unless otherwise noted.  DIAGNOSTIC FINDINGS: See imaging   PATIENT SURVEYS:  LEFS  Extreme difficulty/unable (0), Quite a bit of difficulty (1), Moderate difficulty (2), Little difficulty (3), No difficulty (4) Survey  date:  09/03/2024  Any of your usual work, housework or school activities 0  2. Usual hobbies, recreational or sporting activities 0  3. Getting into/out of the bath 0  4. Walking between rooms 0  5. Putting on socks/shoes 0  6. Squatting  0  7. Lifting an object, like a bag of groceries from the floor 0  8. Performing light activities around your home 0  9. Performing heavy activities around your home 0  10. Getting into/out of a car 0  11. Walking 2 blocks 0  12. Walking 1 mile 0  13. Going up/down 10 stairs (1 flight) 0  14. Standing for 1 hour 0  15.  sitting for 1 hour 0  16. Running on even ground 0  17. Running on uneven ground 0  18. Making sharp turns while running fast 0  19. Hopping  0  20. Rolling over in bed 0  Score total:  0/80     COGNITION: Overall cognitive status: Within functional limits for tasks assessed     SENSATION: Light touch: Impaired - lateral R knee  POSTURE: rounded shoulders and forward head and R LE externally rotated   PALPATION: TTP to distal R quad  LOWER EXTREMITY ROM:  Active ROM Right eval Left eval Right 09/07/24  Knee flexion 75 120 88 AAROM  Knee extension 17 0 17   (Blank rows = not tested)  LOWER EXTREMITY MMT:  MMT Right eval Left eval  Hip flexion    Hip extension    Hip abduction    Hip adduction    Hip internal rotation    Hip external rotation    Knee flexion DNT   Knee extension DNT   Ankle dorsiflexion    Ankle plantarflexion    Ankle inversion    Ankle eversion     (Blank rows = not tested)  FUNCTIONAL TESTS:  30 Second Sit to Stand: 6 reps with UE  TREATMENT: [facility] Adult PT Treatment:                                                DATE: 09/07/2024 BP: 99/54 Supine QS 2x10 - 5 hold Supine heel slide with strap x 10 - 5 hold R Seated hamstring stretch 2x30 R Seated heel slide 2x10 - 5 hold NuStep lvl 3 UE/LE x 4 min working on knee ROM Modalites: GameReady: Right Knee Medium compression   10 minutes  36 degrees  [facility] Adult PT Treatment:  DATE: 09/03/2024 Therapeutic Exercise: Supine ankle pumps x 20 R Seated QS x 5 - 5 hold Seated heel slide x 5 - 5 hold R Seated hamstring stretch x 30 R Modalites: Ice to R knee in sitting  PATIENT EDUCATION:  Education details: eval findings, LEFS, HEP, POC Person educated: Patient Education method: Explanation, Demonstration, and Handouts Education comprehension: verbalized understanding and returned demonstration  HOME EXERCISE PROGRAM: Access Code: [email or web address] URL: [email or web address] Date: 09/03/2024 Prepared by: Alm Kingdom  Exercises - Supine Ankle Pumps  - 8 x daily - 7 x weekly - 2 sets - 20-30 reps - Long Sitting Quad Set  - 4-5 x daily - 7 x weekly - 3 sets - 10 reps - 5 sec hold - Seated Heel Slide  - 4-5 x daily - 7 x weekly - 2-3 sets - 10 reps - 5 sec hold - Seated Hamstring Stretch  - 4-5 x daily - 7 x weekly - 2-3 reps - 30 sec hold  ASSESSMENT:  CLINICAL IMPRESSION: Pt was able to complete all prescribed exercises with no adverse effect. We focused on intitail exercises for TKA rehab, pt demos greater decrease in extension vs flexion. Continues to benefit from skilled PT services, will continue per POC.   EVAL: Patient is a 58 y.o. F who was seen today s/p physical therapy evaluation and treatment for R TKA performed by Dr. Beverley on 08/31/2024. Physical findings are consistent with surgery and recovery timeline as pt demonstrates decreased R knee ROM, strength, and functional mobility. Today we could not assess or treatment very much due to symptoms of dizziness and orthostasis, measured BP twice which was very low and had no symptom resolvement with gingerale and simple sugars. PT assisted pt back to car via w/c, she refused ED transport and notes that she needs to eat something. LEFS shows shows severe disability in performance of home ADLs and  community activities. Pt would benefit from skilled PT services working on improving knee ROM, strength, and gait post TKA. Will assess vitals and symptoms of orthostasis next session.   OBJECTIVE IMPAIRMENTS: Abnormal gait, decreased activity tolerance, decreased balance, decreased endurance, decreased mobility, difficulty walking, decreased ROM, decreased strength, and pain.   ACTIVITY LIMITATIONS: carrying, lifting, bending, sitting, standing, squatting, stairs, transfers, bed mobility, and locomotion level  PARTICIPATION LIMITATIONS: meal prep, cleaning, driving, shopping, community activity, occupation, yard work, and school  PERSONAL FACTORS: Fitness and 3+ comorbidities: L THA, lumbar fusion, HTN are also affecting patient's functional outcome.   REHAB POTENTIAL: Good  CLINICAL DECISION MAKING: Evolving/moderate complexity  EVALUATION COMPLEXITY: Moderate   GOALS: Goals reviewed with patient? No  SHORT TERM GOALS: Target date: 09/24/2024   Pt will be compliant and knowledgeable with initial HEP for improved comfort and carryover Baseline: initial HEP given  Goal status: INITIAL  2.  Pt will self report right knee pain no greater than 8/10 for improved comfort and functional ability Baseline: 10/10 at worst Goal status: INITIAL   LONG TERM GOALS: Target date: 11/26/2024   Pt will improve LEFS to no less than 30/80 as proxy for functional improvement with home ADLs and higher level community activity Baseline: 0/80 Goal status: INITIAL   2.  Pt will self report right knee pain no greater than 6/10 for improved comfort and functional ability Baseline: 10/10 at worst Goal status: INITIAL   3.  Pt will increase 30 Second Sit to Stand rep count by no less than 2 reps for improved balance, strength, and functional mobility  Baseline: deferred due to BP  Goal status: INITIAL   4.  Pt will improve R knee AROM to at least range of 3-115 degrees for improved comfort and  functional mobility Baseline: 17-75 Goal status: INITIAL  5.  Pt will be able to walk recreationally with no increase in R knee pain for improved comfort and return to desired recreational activity Baseline:  Goal status: INITIAL   PLAN:  PT FREQUENCY: 2x/week  PT DURATION: 12 weeks  PLANNED INTERVENTIONS: 97164- PT Re-evaluation, 97110-Therapeutic exercises, 97530- Therapeutic activity, [id number]- Neuromuscular re-education, 97535- Self Care, [id number]- Manual therapy, [id number]- Gait training, [id number]- Electrical stimulation (unattended), [id number]- Electrical stimulation (manual), 97016- Vasopneumatic device, and Patient/Family education  PLAN FOR NEXT SESSION: assess BP, HEP, R knee ROM and gait, quad strength  For all possible CPT codes, reference the Planned Interventions line above.     Check all conditions that are expected to impact treatment: {Conditions expected to impact treatment:Musculoskeletal disorders   If treatment provided at initial evaluation, no treatment charged due to lack of authorization.       Alm JAYSON Kingdom, PT 09/07/2024, 12:03 PM

## 2024-09-09 ENCOUNTER — Ambulatory Visit

## 2024-09-09 DIAGNOSIS — M6281 Muscle weakness (generalized): Secondary | ICD-10-CM

## 2024-09-09 DIAGNOSIS — M25561 Pain in right knee: Secondary | ICD-10-CM | POA: Diagnosis not present

## 2024-09-09 DIAGNOSIS — R2689 Other abnormalities of gait and mobility: Secondary | ICD-10-CM

## 2024-09-09 NOTE — Addendum Note (Signed)
 Addendum  created 09/09/24 1620 by Waddell Lauraine NOVAK, MD   [facility]procedure Staff edited

## 2024-09-09 NOTE — Therapy (Signed)
 OUTPATIENT PHYSICAL THERAPY TREATMENT   Patient Name: Alicia Blake MRN: [id number] DOB:Sep 13, 1966, 59 y.o., female Today's Date: 09/09/2024  END OF SESSION:  PT End of Session - 09/09/24 1052     Visit Number 3    Number of Visits 24    Date for Recertification  11/26/24    Authorization Type [employer]    PT Start Time 1054    PT Stop Time 1145    PT Time Calculation (min) 51 min    Activity Tolerance Patient limited by pain;Patient limited by lethargy;Other (comment)    Behavior During Therapy WFL for tasks assessed/performed            Past Medical History:  Diagnosis Date   Asthma    Bipolar affect, depressed (HCC)    Bronchitis    Chronic back pain    Closed left ankle fracture    COPD (chronic obstructive pulmonary disease) (HCC)    DJD (degenerative joint disease)    BACK   GERD (gastroesophageal reflux disease)    Hypertension    Pneumonia    Pre-diabetes    dr entered diabetes without complications- no meds does not test sugar at home   PTSD (post-traumatic stress disorder)    Scoliosis    Seizures (HCC)    when drinking last yrs ago   Shortness of breath dyspnea    exersion   Substance abuse (HCC)    crack cocaine, 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, none since   Thyroid disease    UTI (lower urinary tract infection)    Past Surgical History:  Procedure Laterality Date   2 LEFT TOES SURGERY  12/2014   finished only on right side   ABDOMINAL EXPOSURE N/A 03/26/2023   Procedure: ABDOMINAL EXPOSURE;  Surgeon: Gretta Lonni PARAS, MD;  Location: [facility] OR;  Service: Vascular;  Laterality: N/A;   ABDOMINAL EXPOSURE N/A 04/02/2023   Procedure: ABDOMINAL EXPOSURE;  Surgeon: Gretta Lonni PARAS, MD;  Location: [facility] OR;  Service: Vascular;  Laterality: N/A;   ANTERIOR LUMBAR FUSION N/A 03/26/2023   Procedure: REMOVAL OF POSTERIOR HARDWARE;  Surgeon: Beuford Anes, MD;  Location: [facility] OR;  Service: Orthopedics;  Laterality: N/A;   ANTERIOR LUMBAR FUSION N/A  03/26/2023   Procedure: LUMBAR FOUR - LUMBAR FIVE, LUMBAR FIVE - SACRUM ONE ANTERIOR LUMBAR INTERBODY FUSION WITH INSTRUMENTATION AND ALLOGRAFT;  Surgeon: Beuford Anes, MD;  Location: [facility] OR;  Service: Orthopedics;  Laterality: N/A;   ANTERIOR LUMBAR FUSION N/A 04/02/2023   Procedure: REVISION ANTERIOR LUMBAR FUSION L5-S1;  Surgeon: Beuford Anes, MD;  Location: [facility] OR;  Service: Orthopedics;  Laterality: N/A;   BACK SURGERY     BALLOON DILATION N/A 12/14/2015   Procedure: BALLOON DILATION;  Surgeon: Norleen Hint, MD;  Location: [facility];  Service: Endoscopy;  Laterality: N/A;   COLONOSCOPY WITH PROPOFOL  N/A 12/14/2015   Procedure: COLONOSCOPY WITH PROPOFOL ;  Surgeon: Norleen Hint, MD;  Location: [facility];  Service: Endoscopy;  Laterality: N/A;   ESOPHAGOGASTRODUODENOSCOPY (EGD) WITH PROPOFOL  N/A 12/14/2015   Procedure: ESOPHAGOGASTRODUODENOSCOPY (EGD) WITH PROPOFOL ;  Surgeon: Norleen Hint, MD;  Location: [facility];  Service: Endoscopy;  Laterality: N/A;   FRACTURE SURGERY     JOINT REPLACEMENT     left hip   LEFT ANKLE SURGERY  12/2014   LUMBAR LAMINECTOMY/DECOMPRESSION MICRODISCECTOMY N/A 06/18/2017   Procedure: LAMINECTOMY AND FORAMINOTOMY LUMBAR FOUR - LUMBAR FIVE;  Surgeon: Gillie Duncans, MD;  Location: [facility] OR;  Service: Neurosurgery;  Laterality: N/A;  LAMINECTOMY AND FORAMINOTOMY LUMBAR 4-  LUMBAR 5   LUMBAR WOUND DEBRIDEMENT N/A 03/10/2020   Procedure: Lumbar Wound Debridement;  Surgeon: Gillie Duncans, MD;  Location: [facility] OR;  Service: Neurosurgery;  Laterality: N/A;  posterior   NECK SURGERY  AS TEENAGER   STITCHES TO NECK    ORIF ANKLE FRACTURE Left 07/24/2021   Procedure: OPEN REDUCTION INTERNAL FIXATION (ORIF) ANKLE FRACTURE;  Surgeon: Beverley Evalene BIRCH, MD;  Location: [facility];  Service: Orthopedics;  Laterality: Left;   SURGERY FOR CUT ON STOMACH  TEENAGER   TOTAL HIP ARTHROPLASTY Left 05/14/2016   Procedure: TOTAL HIP ARTHROPLASTY ANTERIOR APPROACH;  Surgeon:  Evalene BIRCH Beverley, MD;  Location: [facility] OR;  Service: Orthopedics;  Laterality: Left;   TOTAL KNEE ARTHROPLASTY Right 08/31/2024   Procedure: ARTHROPLASTY, KNEE, TOTAL;  Surgeon: Beverley Evalene BIRCH, MD;  Location: [facility] ORS;  Service: Orthopedics;  Laterality: Right;   Patient Active Problem List   Diagnosis Date Noted   S/P total knee arthroplasty, right 08/31/2024   Radiculopathy, lumbar region 03/26/2023   Acid reflux 11/07/2022   Lumbar foraminal stenosis 04/21/2020   Tongue lesion 03/04/2019   Macromastia 08/11/2018   Laryngopharyngeal reflux (LPR) 12/16/2017   Pharyngeal dysphagia 12/16/2017   Right ear impacted cerumen 12/16/2017   Lumbar stenosis with neurogenic claudication 06/18/2017   Bilateral sensorineural hearing loss 10/14/2016   Subjective tinnitus, bilateral 10/14/2016   Tobacco abuse 04/19/2016   Primary osteoarthritis of left hip 04/18/2016   Opioid use disorder, mild, abuse (HCC) 09/08/2015   Schizoaffective disorder, bipolar type (HCC) 09/07/2015   Chronic pain syndrome 09/07/2015   Alcohol use disorder, moderate, in sustained remission (HCC) 09/07/2015   Cocaine use disorder, moderate, in sustained remission (HCC) 09/07/2015   Cannabis use disorder, moderate, in sustained remission (HCC) 09/07/2015   DDD (degenerative disc disease), lumbar 09/07/2015   Hx of fracture of ankle 09/07/2015   Arthropathy of ankle and foot 03/04/2014   Hammertoe 03/04/2014   Cystocele, unspecified 09/03/2013   Symptomatic Fibroids 08/09/2013   Breast lump on left side at 9 o'clock position 06/29/2013   Breast discharge 06/29/2013   Closed disp fx of lateral condyle of left tibia with routine healing 11/12/2012   Asthma 08/13/2011   History of gastroesophageal reflux (GERD) 08/13/2011   Hyperglycemia 08/13/2011   Post-traumatic stress disorder, unspecified 08/13/2011    PCP: Shelda Atlas, MD  REFERRING PROVIDER: Beverley Evalene BIRCH, MD  REFERRING DIAG: [phone] (ICD-10-CM) -  Presence of right artificial knee joint   THERAPY DIAG:  Acute pain of right knee  Muscle weakness (generalized)  Other abnormalities of gait and mobility  Rationale for Evaluation and Treatment: Rehabilitation  ONSET DATE: 08/31/2024  SUBJECTIVE:   SUBJECTIVE STATEMENT: Pt presents to PT with continued 8/10 R knee pain. Has been compliant with HEP.   EVAL: Pt presents to PT s/p physical therapy evaluation and treatment for R TKA performed by Dr. Beverley on 08/31/2024. On arrival and start of evaluation she notes that she is feeling very dizzy while walking back to gym. We checked her BP which was very low, she states that she has not eaten since noon the previous day. Has had a lot of pain in R knee post surgery, difficult getting around her house and her family is there to help her.   PERTINENT HISTORY: L THA, lumbar fusion, HTN  PAIN:  Are you having pain?  Yes: NPRS scale: 9/10 Worst: 10/10 Pain location: R anterior knee Pain description: sharp, sore Aggravating factors: transfers, standing, bending Relieving factors:  medication   PRECAUTIONS: None  RED FLAGS: None   WEIGHT BEARING RESTRICTIONS: Yes WBAT  FALLS:  Has patient fallen in last 6 months? No  LIVING ENVIRONMENT: Lives with: lives alone Lives in: House/apartment Stairs: Yes: External: 3 steps; bilateral but cannot reach both Has following equipment at home: Single point cane and Walker - 2 wheeled  OCCUPATION: Not working   PLOF: Independent  PATIENT GOALS: improve knee motion, get back to walking recreationally, improve knee ROM  NEXT MD VISIT: 09/15/2024  OBJECTIVE:  Note: Objective measures were completed at Evaluation unless otherwise noted.  DIAGNOSTIC FINDINGS: See imaging   PATIENT SURVEYS:  LEFS  Extreme difficulty/unable (0), Quite a bit of difficulty (1), Moderate difficulty (2), Little difficulty (3), No difficulty (4) Survey date:  09/03/2024  Any of your usual work, housework or  school activities 0  2. Usual hobbies, recreational or sporting activities 0  3. Getting into/out of the bath 0  4. Walking between rooms 0  5. Putting on socks/shoes 0  6. Squatting  0  7. Lifting an object, like a bag of groceries from the floor 0  8. Performing light activities around your home 0  9. Performing heavy activities around your home 0  10. Getting into/out of a car 0  11. Walking 2 blocks 0  12. Walking 1 mile 0  13. Going up/down 10 stairs (1 flight) 0  14. Standing for 1 hour 0  15.  sitting for 1 hour 0  16. Running on even ground 0  17. Running on uneven ground 0  18. Making sharp turns while running fast 0  19. Hopping  0  20. Rolling over in bed 0  Score total:  0/80     COGNITION: Overall cognitive status: Within functional limits for tasks assessed     SENSATION: Light touch: Impaired - lateral R knee  POSTURE: rounded shoulders and forward head and R LE externally rotated   PALPATION: TTP to distal R quad  LOWER EXTREMITY ROM:  Active ROM Right eval Left eval Right 09/07/24 Right 09/09/24  Knee flexion 75 120 88 AAROM 92 AAROM  Knee extension 17 0 17 15   (Blank rows = not tested)  LOWER EXTREMITY MMT:  MMT Right eval Left eval  Hip flexion    Hip extension    Hip abduction    Hip adduction    Hip internal rotation    Hip external rotation    Knee flexion DNT   Knee extension DNT   Ankle dorsiflexion    Ankle plantarflexion    Ankle inversion    Ankle eversion     (Blank rows = not tested)  FUNCTIONAL TESTS:  30 Second Sit to Stand: 6 reps with UE  TREATMENT: [facility] Adult PT Treatment:                                                DATE: 09/09/2024 BP: 124/74 NuStep lvl 4 UE/LE x 4 min working on knee ROM Seated hamstring stretch 2x30 R LAQ 2x10 R STS 3x5 BUE support  Supine heel slide with strap 2x10 - 5 hold R Supine QS 2x10 - 5 hold SAQ 2x10 R Modalites: GameReady: Right Knee Medium compression  10 minutes   36 degrees  [facility] Adult PT Treatment:  DATE: 09/07/2024 BP: 99/54 Supine QS 2x10 - 5 hold Supine heel slide with strap x 10 - 5 hold R Seated hamstring stretch 2x30 R Seated heel slide 2x10 - 5 hold NuStep lvl 3 UE/LE x 4 min working on knee ROM Modalites: GameReady: Right Knee Medium compression  10 minutes  36 degrees  [facility] Adult PT Treatment:                                                DATE: 09/03/2024 Therapeutic Exercise: Supine ankle pumps x 20 R Seated QS x 5 - 5 hold Seated heel slide x 5 - 5 hold R Seated hamstring stretch x 30 R Modalites: Ice to R knee in sitting  PATIENT EDUCATION:  Education details: eval findings, LEFS, HEP, POC Person educated: Patient Education method: Explanation, Demonstration, and Handouts Education comprehension: verbalized understanding and returned demonstration  HOME EXERCISE PROGRAM: Access Code: [email or web address] URL: [email or web address] Date: 09/03/2024 Prepared by: Alm Kingdom  Exercises - Supine Ankle Pumps  - 8 x daily - 7 x weekly - 2 sets - 20-30 reps - Long Sitting Quad Set  - 4-5 x daily - 7 x weekly - 3 sets - 10 reps - 5 sec hold - Seated Heel Slide  - 4-5 x daily - 7 x weekly - 2-3 sets - 10 reps - 5 sec hold - Seated Hamstring Stretch  - 4-5 x daily - 7 x weekly - 2-3 reps - 30 sec hold  ASSESSMENT:  CLINICAL IMPRESSION: Pt was able to complete all prescribed exercises with no adverse effect. We focused on intitail exercises for TKA rehab, pt demos greater decrease in extension vs flexion although both improved today. Continues to benefit from skilled PT services, will continue per POC.   EVAL: Patient is a 58 y.o. F who was seen today s/p physical therapy evaluation and treatment for R TKA performed by Dr. Beverley on 08/31/2024. Physical findings are consistent with surgery and recovery timeline as pt demonstrates decreased R knee ROM, strength, and  functional mobility. Today we could not assess or treatment very much due to symptoms of dizziness and orthostasis, measured BP twice which was very low and had no symptom resolvement with gingerale and simple sugars. PT assisted pt back to car via w/c, she refused ED transport and notes that she needs to eat something. LEFS shows shows severe disability in performance of home ADLs and community activities. Pt would benefit from skilled PT services working on improving knee ROM, strength, and gait post TKA. Will assess vitals and symptoms of orthostasis next session.   OBJECTIVE IMPAIRMENTS: Abnormal gait, decreased activity tolerance, decreased balance, decreased endurance, decreased mobility, difficulty walking, decreased ROM, decreased strength, and pain.   ACTIVITY LIMITATIONS: carrying, lifting, bending, sitting, standing, squatting, stairs, transfers, bed mobility, and locomotion level  PARTICIPATION LIMITATIONS: meal prep, cleaning, driving, shopping, community activity, occupation, yard work, and school  PERSONAL FACTORS: Fitness and 3+ comorbidities: L THA, lumbar fusion, HTN are also affecting patient's functional outcome.   REHAB POTENTIAL: Good  CLINICAL DECISION MAKING: Evolving/moderate complexity  EVALUATION COMPLEXITY: Moderate   GOALS: Goals reviewed with patient? No  SHORT TERM GOALS: Target date: 09/24/2024   Pt will be compliant and knowledgeable with initial HEP for improved comfort and carryover Baseline: initial HEP given  Goal status: INITIAL  2.  Pt will self report  right knee pain no greater than 8/10 for improved comfort and functional ability Baseline: 10/10 at worst Goal status: INITIAL   LONG TERM GOALS: Target date: 11/26/2024   Pt will improve LEFS to no less than 30/80 as proxy for functional improvement with home ADLs and higher level community activity Baseline: 0/80 Goal status: INITIAL   2.  Pt will self report right knee pain no greater than  6/10 for improved comfort and functional ability Baseline: 10/10 at worst Goal status: INITIAL   3.  Pt will increase 30 Second Sit to Stand rep count by no less than 2 reps for improved balance, strength, and functional mobility Baseline: deferred due to BP  Goal status: INITIAL   4.  Pt will improve R knee AROM to at least range of 3-115 degrees for improved comfort and functional mobility Baseline: 17-75 Goal status: INITIAL  5.  Pt will be able to walk recreationally with no increase in R knee pain for improved comfort and return to desired recreational activity Baseline:  Goal status: INITIAL   PLAN:  PT FREQUENCY: 2x/week  PT DURATION: 12 weeks  PLANNED INTERVENTIONS: 97164- PT Re-evaluation, 97110-Therapeutic exercises, 97530- Therapeutic activity, [id number]- Neuromuscular re-education, 97535- Self Care, [id number]- Manual therapy, [id number]- Gait training, [id number]- Electrical stimulation (unattended), [id number]- Electrical stimulation (manual), 97016- Vasopneumatic device, and Patient/Family education  PLAN FOR NEXT SESSION: assess BP, HEP, R knee ROM and gait, quad strength  For all possible CPT codes, reference the Planned Interventions line above.     Check all conditions that are expected to impact treatment: {Conditions expected to impact treatment:Musculoskeletal disorders   If treatment provided at initial evaluation, no treatment charged due to lack of authorization.       Alm JAYSON Kingdom, PT 09/09/2024, 12:10 PM

## 2024-09-14 ENCOUNTER — Ambulatory Visit

## 2024-09-14 DIAGNOSIS — M25561 Pain in right knee: Secondary | ICD-10-CM | POA: Diagnosis not present

## 2024-09-14 DIAGNOSIS — M6281 Muscle weakness (generalized): Secondary | ICD-10-CM

## 2024-09-14 DIAGNOSIS — R2689 Other abnormalities of gait and mobility: Secondary | ICD-10-CM

## 2024-09-14 NOTE — Therapy (Signed)
 OUTPATIENT PHYSICAL THERAPY TREATMENT   Patient Name: Alicia Blake MRN: [id number] DOB:May 04, 1966, 58 y.o., female Today's Date: 09/14/2024  END OF SESSION:  PT End of Session - 09/14/24 1058     Visit Number 4    Number of Visits 24    Date for Recertification  11/26/24    Authorization Type [employer]    PT Start Time 1100    PT Stop Time 1140    PT Time Calculation (min) 40 min    Activity Tolerance Patient limited by pain;Patient limited by lethargy;Other (comment)    Behavior During Therapy WFL for tasks assessed/performed             Past Medical History:  Diagnosis Date   Asthma    Bipolar affect, depressed (HCC)    Bronchitis    Chronic back pain    Closed left ankle fracture    COPD (chronic obstructive pulmonary disease) (HCC)    DJD (degenerative joint disease)    BACK   GERD (gastroesophageal reflux disease)    Hypertension    Pneumonia    Pre-diabetes    dr entered diabetes without complications- no meds does not test sugar at home   PTSD (post-traumatic stress disorder)    Scoliosis    Seizures (HCC)    when drinking last yrs ago   Shortness of breath dyspnea    exersion   Substance abuse (HCC)    crack cocaine, 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, none since   Thyroid disease    UTI (lower urinary tract infection)    Past Surgical History:  Procedure Laterality Date   2 LEFT TOES SURGERY  12/2014   finished only on right side   ABDOMINAL EXPOSURE N/A 03/26/2023   Procedure: ABDOMINAL EXPOSURE;  Surgeon: Gretta Lonni PARAS, MD;  Location: [facility] OR;  Service: Vascular;  Laterality: N/A;   ABDOMINAL EXPOSURE N/A 04/02/2023   Procedure: ABDOMINAL EXPOSURE;  Surgeon: Gretta Lonni PARAS, MD;  Location: [facility] OR;  Service: Vascular;  Laterality: N/A;   ANTERIOR LUMBAR FUSION N/A 03/26/2023   Procedure: REMOVAL OF POSTERIOR HARDWARE;  Surgeon: Beuford Anes, MD;  Location: [facility] OR;  Service: Orthopedics;  Laterality: N/A;   ANTERIOR LUMBAR FUSION N/A  03/26/2023   Procedure: LUMBAR FOUR - LUMBAR FIVE, LUMBAR FIVE - SACRUM ONE ANTERIOR LUMBAR INTERBODY FUSION WITH INSTRUMENTATION AND ALLOGRAFT;  Surgeon: Beuford Anes, MD;  Location: [facility] OR;  Service: Orthopedics;  Laterality: N/A;   ANTERIOR LUMBAR FUSION N/A 04/02/2023   Procedure: REVISION ANTERIOR LUMBAR FUSION L5-S1;  Surgeon: Beuford Anes, MD;  Location: [facility] OR;  Service: Orthopedics;  Laterality: N/A;   BACK SURGERY     BALLOON DILATION N/A 12/14/2015   Procedure: BALLOON DILATION;  Surgeon: Norleen Hint, MD;  Location: [facility]COPY;  Service: Endoscopy;  Laterality: N/A;   COLONOSCOPY WITH PROPOFOL  N/A 12/14/2015   Procedure: COLONOSCOPY WITH PROPOFOL ;  Surgeon: Norleen Hint, MD;  Location: [facility];  Service: Endoscopy;  Laterality: N/A;   ESOPHAGOGASTRODUODENOSCOPY (EGD) WITH PROPOFOL  N/A 12/14/2015   Procedure: ESOPHAGOGASTRODUODENOSCOPY (EGD) WITH PROPOFOL ;  Surgeon: Norleen Hint, MD;  Location: [facility];  Service: Endoscopy;  Laterality: N/A;   FRACTURE SURGERY     JOINT REPLACEMENT     left hip   LEFT ANKLE SURGERY  12/2014   LUMBAR LAMINECTOMY/DECOMPRESSION MICRODISCECTOMY N/A 06/18/2017   Procedure: LAMINECTOMY AND FORAMINOTOMY LUMBAR FOUR - LUMBAR FIVE;  Surgeon: Gillie Duncans, MD;  Location: [facility] OR;  Service: Neurosurgery;  Laterality: N/A;  LAMINECTOMY AND FORAMINOTOMY LUMBAR  4- LUMBAR 5   LUMBAR WOUND DEBRIDEMENT N/A 03/10/2020   Procedure: Lumbar Wound Debridement;  Surgeon: Gillie Duncans, MD;  Location: [facility] OR;  Service: Neurosurgery;  Laterality: N/A;  posterior   NECK SURGERY  AS TEENAGER   STITCHES TO NECK    ORIF ANKLE FRACTURE Left 07/24/2021   Procedure: OPEN REDUCTION INTERNAL FIXATION (ORIF) ANKLE FRACTURE;  Surgeon: Beverley Evalene BIRCH, MD;  Location: [facility];  Service: Orthopedics;  Laterality: Left;   SURGERY FOR CUT ON STOMACH  TEENAGER   TOTAL HIP ARTHROPLASTY Left 05/14/2016   Procedure: TOTAL HIP ARTHROPLASTY ANTERIOR APPROACH;  Surgeon:  Evalene BIRCH Beverley, MD;  Location: [facility] OR;  Service: Orthopedics;  Laterality: Left;   TOTAL KNEE ARTHROPLASTY Right 08/31/2024   Procedure: ARTHROPLASTY, KNEE, TOTAL;  Surgeon: Beverley Evalene BIRCH, MD;  Location: [facility] ORS;  Service: Orthopedics;  Laterality: Right;   Patient Active Problem List   Diagnosis Date Noted   S/P total knee arthroplasty, right 08/31/2024   Radiculopathy, lumbar region 03/26/2023   Acid reflux 11/07/2022   Lumbar foraminal stenosis 04/21/2020   Tongue lesion 03/04/2019   Macromastia 08/11/2018   Laryngopharyngeal reflux (LPR) 12/16/2017   Pharyngeal dysphagia 12/16/2017   Right ear impacted cerumen 12/16/2017   Lumbar stenosis with neurogenic claudication 06/18/2017   Bilateral sensorineural hearing loss 10/14/2016   Subjective tinnitus, bilateral 10/14/2016   Tobacco abuse 04/19/2016   Primary osteoarthritis of left hip 04/18/2016   Opioid use disorder, mild, abuse (HCC) 09/08/2015   Schizoaffective disorder, bipolar type (HCC) 09/07/2015   Chronic pain syndrome 09/07/2015   Alcohol use disorder, moderate, in sustained remission (HCC) 09/07/2015   Cocaine use disorder, moderate, in sustained remission (HCC) 09/07/2015   Cannabis use disorder, moderate, in sustained remission (HCC) 09/07/2015   DDD (degenerative disc disease), lumbar 09/07/2015   Hx of fracture of ankle 09/07/2015   Arthropathy of ankle and foot 03/04/2014   Hammertoe 03/04/2014   Cystocele, unspecified 09/03/2013   Symptomatic Fibroids 08/09/2013   Breast lump on left side at 9 o'clock position 06/29/2013   Breast discharge 06/29/2013   Closed disp fx of lateral condyle of left tibia with routine healing 11/12/2012   Asthma 08/13/2011   History of gastroesophageal reflux (GERD) 08/13/2011   Hyperglycemia 08/13/2011   Post-traumatic stress disorder, unspecified 08/13/2011    PCP: Shelda Atlas, MD  REFERRING PROVIDER: Beverley Evalene BIRCH, MD  REFERRING DIAG: [phone] (ICD-10-CM) -  Presence of right artificial knee joint   THERAPY DIAG:  Acute pain of right knee  Muscle weakness (generalized)  Other abnormalities of gait and mobility  Rationale for Evaluation and Treatment: Rehabilitation  ONSET DATE: 08/31/2024  SUBJECTIVE:   SUBJECTIVE STATEMENT: Pt presents to PT with reports of slight improvement in R knee pain. Has been compliant with HEP.   EVAL: Pt presents to PT s/p physical therapy evaluation and treatment for R TKA performed by Dr. Beverley on 08/31/2024. On arrival and start of evaluation she notes that she is feeling very dizzy while walking back to gym. We checked her BP which was very low, she states that she has not eaten since noon the previous day. Has had a lot of pain in R knee post surgery, difficult getting around her house and her family is there to help her.   PERTINENT HISTORY: L THA, lumbar fusion, HTN  PAIN:  Are you having pain?  Yes: NPRS scale: 7/10 Worst: 10/10 Pain location: R anterior knee Pain description: sharp, sore Aggravating factors: transfers,  standing, bending Relieving factors: medication   PRECAUTIONS: None  RED FLAGS: None   WEIGHT BEARING RESTRICTIONS: Yes WBAT  FALLS:  Has patient fallen in last 6 months? No  LIVING ENVIRONMENT: Lives with: lives alone Lives in: House/apartment Stairs: Yes: External: 3 steps; bilateral but cannot reach both Has following equipment at home: Single point cane and Walker - 2 wheeled  OCCUPATION: Not working   PLOF: Independent  PATIENT GOALS: improve knee motion, get back to walking recreationally, improve knee ROM  NEXT MD VISIT: 09/15/2024  OBJECTIVE:  Note: Objective measures were completed at Evaluation unless otherwise noted.  DIAGNOSTIC FINDINGS: See imaging   PATIENT SURVEYS:  LEFS  Extreme difficulty/unable (0), Quite a bit of difficulty (1), Moderate difficulty (2), Little difficulty (3), No difficulty (4) Survey date:  09/03/2024  Any of your usual  work, housework or school activities 0  2. Usual hobbies, recreational or sporting activities 0  3. Getting into/out of the bath 0  4. Walking between rooms 0  5. Putting on socks/shoes 0  6. Squatting  0  7. Lifting an object, like a bag of groceries from the floor 0  8. Performing light activities around your home 0  9. Performing heavy activities around your home 0  10. Getting into/out of a car 0  11. Walking 2 blocks 0  12. Walking 1 mile 0  13. Going up/down 10 stairs (1 flight) 0  14. Standing for 1 hour 0  15.  sitting for 1 hour 0  16. Running on even ground 0  17. Running on uneven ground 0  18. Making sharp turns while running fast 0  19. Hopping  0  20. Rolling over in bed 0  Score total:  0/80     COGNITION: Overall cognitive status: Within functional limits for tasks assessed     SENSATION: Light touch: Impaired - lateral R knee  POSTURE: rounded shoulders and forward head and R LE externally rotated   PALPATION: TTP to distal R quad  LOWER EXTREMITY ROM:  Active ROM Right eval Left eval Right 09/07/24 Right 09/09/24 Right 09/14/24  Knee flexion 75 120 88 AAROM 92 AAROM 90 AAROM  Knee extension 17 0 17 15 15    (Blank rows = not tested)  LOWER EXTREMITY MMT:  MMT Right eval Left eval  Hip flexion    Hip extension    Hip abduction    Hip adduction    Hip internal rotation    Hip external rotation    Knee flexion DNT   Knee extension DNT   Ankle dorsiflexion    Ankle plantarflexion    Ankle inversion    Ankle eversion     (Blank rows = not tested)  FUNCTIONAL TESTS:  30 Second Sit to Stand: 6 reps with UE  TREATMENT: [facility] Adult PT Treatment:                                                DATE: 09/14/2024 BP: 124/74 NuStep lvl 5 UE/LE x 4 min working on knee ROM STS 2x5 BUE support R foot back Seated hamstring stretch 2x30 R LAQ 2x10 R Supine QS 2x10 - 3 hold Supine heel slide with strap 2x10 - 5 hold  R Modalites: GameReady: Right Knee Medium compression  10 minutes  36 degrees  [facility] Adult PT Treatment:  DATE: 09/09/2024 BP: 124/74 NuStep lvl 4 UE/LE x 4 min working on knee ROM Seated hamstring stretch 2x30 R LAQ 2x10 R STS 3x5 BUE support  Supine heel slide with strap 2x10 - 5 hold R Supine QS 2x10 - 5 hold SAQ 2x10 R Modalites: GameReady: Right Knee Medium compression  10 minutes  36 degrees  [facility] Adult PT Treatment:                                                DATE: 09/07/2024 BP: 99/54 Supine QS 2x10 - 5 hold Supine heel slide with strap x 10 - 5 hold R Seated hamstring stretch 2x30 R Seated heel slide 2x10 - 5 hold NuStep lvl 3 UE/LE x 4 min working on knee ROM Modalites: GameReady: Right Knee Medium compression  10 minutes  36 degrees  [facility] Adult PT Treatment:                                                DATE: 09/03/2024 Therapeutic Exercise: Supine ankle pumps x 20 R Seated QS x 5 - 5 hold Seated heel slide x 5 - 5 hold R Seated hamstring stretch x 30 R Modalites: Ice to R knee in sitting  PATIENT EDUCATION:  Education details: eval findings, LEFS, HEP, POC Person educated: Patient Education method: Explanation, Demonstration, and Handouts Education comprehension: verbalized understanding and returned demonstration  HOME EXERCISE PROGRAM: Access Code: [email or web address] URL: [email or web address] Date: 09/14/2024 Prepared by: Alm Kingdom  Exercises - Supine Ankle Pumps  - 8 x daily - 7 x weekly - 2 sets - 20-30 reps - Long Sitting Quad Set  - 4-5 x daily - 7 x weekly - 3 sets - 10 reps - 5 sec hold - Seated Heel Slide  - 4-5 x daily - 7 x weekly - 2-3 sets - 10 reps - 5 sec hold - Seated Hamstring Stretch  - 4-5 x daily - 7 x weekly - 2-3 reps - 30 sec hold - Seated Knee Extension Stretch with Chair  - 3 x daily - 7 x weekly - 2 min hold  ASSESSMENT:  CLINICAL IMPRESSION: Pt  was able to complete all prescribed exercises with no adverse effect. We focused on continued exercises for TKA rehab focused on knee flexion and extension ROM. Continues to benefit from skilled PT services, will continue per POC.   EVAL: Patient is a 58 y.o. F who was seen today s/p physical therapy evaluation and treatment for R TKA performed by Dr. Beverley on 08/31/2024. Physical findings are consistent with surgery and recovery timeline as pt demonstrates decreased R knee ROM, strength, and functional mobility. Today we could not assess or treatment very much due to symptoms of dizziness and orthostasis, measured BP twice which was very low and had no symptom resolvement with gingerale and simple sugars. PT assisted pt back to car via w/c, she refused ED transport and notes that she needs to eat something. LEFS shows shows severe disability in performance of home ADLs and community activities. Pt would benefit from skilled PT services working on improving knee ROM, strength, and gait post TKA. Will assess vitals and symptoms of orthostasis next session.   OBJECTIVE IMPAIRMENTS: Abnormal gait, decreased activity  tolerance, decreased balance, decreased endurance, decreased mobility, difficulty walking, decreased ROM, decreased strength, and pain.   ACTIVITY LIMITATIONS: carrying, lifting, bending, sitting, standing, squatting, stairs, transfers, bed mobility, and locomotion level  PARTICIPATION LIMITATIONS: meal prep, cleaning, driving, shopping, community activity, occupation, yard work, and school  PERSONAL FACTORS: Fitness and 3+ comorbidities: L THA, lumbar fusion, HTN are also affecting patient's functional outcome.   REHAB POTENTIAL: Good  CLINICAL DECISION MAKING: Evolving/moderate complexity  EVALUATION COMPLEXITY: Moderate   GOALS: Goals reviewed with patient? No  SHORT TERM GOALS: Target date: 09/24/2024   Pt will be compliant and knowledgeable with initial HEP for improved comfort  and carryover Baseline: initial HEP given  Goal status: INITIAL  2.  Pt will self report right knee pain no greater than 8/10 for improved comfort and functional ability Baseline: 10/10 at worst Goal status: INITIAL   LONG TERM GOALS: Target date: 11/26/2024   Pt will improve LEFS to no less than 30/80 as proxy for functional improvement with home ADLs and higher level community activity Baseline: 0/80 Goal status: INITIAL   2.  Pt will self report right knee pain no greater than 6/10 for improved comfort and functional ability Baseline: 10/10 at worst Goal status: INITIAL   3.  Pt will increase 30 Second Sit to Stand rep count by no less than 2 reps for improved balance, strength, and functional mobility Baseline: deferred due to BP  Goal status: INITIAL   4.  Pt will improve R knee AROM to at least range of 3-115 degrees for improved comfort and functional mobility Baseline: 17-75 Goal status: INITIAL  5.  Pt will be able to walk recreationally with no increase in R knee pain for improved comfort and return to desired recreational activity Baseline:  Goal status: INITIAL   PLAN:  PT FREQUENCY: 2x/week  PT DURATION: 12 weeks  PLANNED INTERVENTIONS: 97164- PT Re-evaluation, 97110-Therapeutic exercises, 97530- Therapeutic activity, [id number]- Neuromuscular re-education, 97535- Self Care, [id number]- Manual therapy, [id number]- Gait training, [id number]- Electrical stimulation (unattended), [id number]- Electrical stimulation (manual), 97016- Vasopneumatic device, and Patient/Family education  PLAN FOR NEXT SESSION: assess BP, HEP, R knee ROM and gait, quad strength  For all possible CPT codes, reference the Planned Interventions line above.     Check all conditions that are expected to impact treatment: {Conditions expected to impact treatment:Musculoskeletal disorders   If treatment provided at initial evaluation, no treatment charged due to lack of authorization.       Alm JAYSON Kingdom,  PT 09/14/2024, 1:03 PM

## 2024-09-16 ENCOUNTER — Ambulatory Visit

## 2024-09-16 DIAGNOSIS — M25561 Pain in right knee: Secondary | ICD-10-CM | POA: Diagnosis not present

## 2024-09-16 DIAGNOSIS — R2689 Other abnormalities of gait and mobility: Secondary | ICD-10-CM

## 2024-09-16 DIAGNOSIS — M6281 Muscle weakness (generalized): Secondary | ICD-10-CM

## 2024-09-16 NOTE — Therapy (Signed)
 OUTPATIENT PHYSICAL THERAPY TREATMENT   Patient Name: Alicia Blake MRN: [id number] DOB:Mar 20, 1966, 58 y.o., female Today's Date: 09/16/2024  END OF SESSION:  PT End of Session - 09/16/24 1048     Visit Number 5    Number of Visits 24    Date for Recertification  11/26/24    Authorization Type [employer]    Authorization Time Period Approved 10 PT visits from 09/03/24-12/01/24    Authorization - Visit Number 4    Authorization - Number of Visits 10    PT Start Time 1100    PT Stop Time 1140    PT Time Calculation (min) 40 min    Activity Tolerance Patient limited by pain;Patient limited by lethargy;Other (comment)    Behavior During Therapy WFL for tasks assessed/performed              Past Medical History:  Diagnosis Date   Asthma    Bipolar affect, depressed (HCC)    Bronchitis    Chronic back pain    Closed left ankle fracture    COPD (chronic obstructive pulmonary disease) (HCC)    DJD (degenerative joint disease)    BACK   GERD (gastroesophageal reflux disease)    Hypertension    Pneumonia    Pre-diabetes    dr entered diabetes without complications- no meds does not test sugar at home   PTSD (post-traumatic stress disorder)    Scoliosis    Seizures (HCC)    when drinking last yrs ago   Shortness of breath dyspnea    exersion   Substance abuse (HCC)    crack cocaine, 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, none since   Thyroid disease    UTI (lower urinary tract infection)    Past Surgical History:  Procedure Laterality Date   2 LEFT TOES SURGERY  12/2014   finished only on right side   ABDOMINAL EXPOSURE N/A 03/26/2023   Procedure: ABDOMINAL EXPOSURE;  Surgeon: Gretta Lonni PARAS, MD;  Location: [facility] OR;  Service: Vascular;  Laterality: N/A;   ABDOMINAL EXPOSURE N/A 04/02/2023   Procedure: ABDOMINAL EXPOSURE;  Surgeon: Gretta Lonni PARAS, MD;  Location: [facility] OR;  Service: Vascular;  Laterality: N/A;   ANTERIOR LUMBAR FUSION N/A 03/26/2023   Procedure:  REMOVAL OF POSTERIOR HARDWARE;  Surgeon: Beuford Anes, MD;  Location: [facility] OR;  Service: Orthopedics;  Laterality: N/A;   ANTERIOR LUMBAR FUSION N/A 03/26/2023   Procedure: LUMBAR FOUR - LUMBAR FIVE, LUMBAR FIVE - SACRUM ONE ANTERIOR LUMBAR INTERBODY FUSION WITH INSTRUMENTATION AND ALLOGRAFT;  Surgeon: Beuford Anes, MD;  Location: [facility] OR;  Service: Orthopedics;  Laterality: N/A;   ANTERIOR LUMBAR FUSION N/A 04/02/2023   Procedure: REVISION ANTERIOR LUMBAR FUSION L5-S1;  Surgeon: Beuford Anes, MD;  Location: [facility] OR;  Service: Orthopedics;  Laterality: N/A;   BACK SURGERY     BALLOON DILATION N/A 12/14/2015   Procedure: BALLOON DILATION;  Surgeon: Norleen Hint, MD;  Location: [facility];  Service: Endoscopy;  Laterality: N/A;   COLONOSCOPY WITH PROPOFOL  N/A 12/14/2015   Procedure: COLONOSCOPY WITH PROPOFOL ;  Surgeon: Norleen Hint, MD;  Location: [facility];  Service: Endoscopy;  Laterality: N/A;   ESOPHAGOGASTRODUODENOSCOPY (EGD) WITH PROPOFOL  N/A 12/14/2015   Procedure: ESOPHAGOGASTRODUODENOSCOPY (EGD) WITH PROPOFOL ;  Surgeon: Norleen Hint, MD;  Location: [facility];  Service: Endoscopy;  Laterality: N/A;   FRACTURE SURGERY     JOINT REPLACEMENT     left hip   LEFT ANKLE SURGERY  12/2014   LUMBAR LAMINECTOMY/DECOMPRESSION MICRODISCECTOMY N/A 06/18/2017  Procedure: LAMINECTOMY AND FORAMINOTOMY LUMBAR FOUR - LUMBAR FIVE;  Surgeon: Gillie Duncans, MD;  Location: [facility] OR;  Service: Neurosurgery;  Laterality: N/A;  LAMINECTOMY AND FORAMINOTOMY LUMBAR 4- LUMBAR 5   LUMBAR WOUND DEBRIDEMENT N/A 03/10/2020   Procedure: Lumbar Wound Debridement;  Surgeon: Gillie Duncans, MD;  Location: [facility] OR;  Service: Neurosurgery;  Laterality: N/A;  posterior   NECK SURGERY  AS TEENAGER   STITCHES TO NECK    ORIF ANKLE FRACTURE Left 07/24/2021   Procedure: OPEN REDUCTION INTERNAL FIXATION (ORIF) ANKLE FRACTURE;  Surgeon: Beverley Evalene BIRCH, MD;  Location: [facility];  Service: Orthopedics;  Laterality: Left;    SURGERY FOR CUT ON STOMACH  TEENAGER   TOTAL HIP ARTHROPLASTY Left 05/14/2016   Procedure: TOTAL HIP ARTHROPLASTY ANTERIOR APPROACH;  Surgeon: Evalene BIRCH Beverley, MD;  Location: [facility] OR;  Service: Orthopedics;  Laterality: Left;   TOTAL KNEE ARTHROPLASTY Right 08/31/2024   Procedure: ARTHROPLASTY, KNEE, TOTAL;  Surgeon: Beverley Evalene BIRCH, MD;  Location: [facility];  Service: Orthopedics;  Laterality: Right;   Patient Active Problem List   Diagnosis Date Noted   S/P total knee arthroplasty, right 08/31/2024   Radiculopathy, lumbar region 03/26/2023   Acid reflux 11/07/2022   Lumbar foraminal stenosis 04/21/2020   Tongue lesion 03/04/2019   Macromastia 08/11/2018   Laryngopharyngeal reflux (LPR) 12/16/2017   Pharyngeal dysphagia 12/16/2017   Right ear impacted cerumen 12/16/2017   Lumbar stenosis with neurogenic claudication 06/18/2017   Bilateral sensorineural hearing loss 10/14/2016   Subjective tinnitus, bilateral 10/14/2016   Tobacco abuse 04/19/2016   Primary osteoarthritis of left hip 04/18/2016   Opioid use disorder, mild, abuse (HCC) 09/08/2015   Schizoaffective disorder, bipolar type (HCC) 09/07/2015   Chronic pain syndrome 09/07/2015   Alcohol use disorder, moderate, in sustained remission (HCC) 09/07/2015   Cocaine use disorder, moderate, in sustained remission (HCC) 09/07/2015   Cannabis use disorder, moderate, in sustained remission (HCC) 09/07/2015   DDD (degenerative disc disease), lumbar 09/07/2015   Hx of fracture of ankle 09/07/2015   Arthropathy of ankle and foot 03/04/2014   Hammertoe 03/04/2014   Cystocele, unspecified 09/03/2013   Symptomatic Fibroids 08/09/2013   Breast lump on left side at 9 o'clock position 06/29/2013   Breast discharge 06/29/2013   Closed disp fx of lateral condyle of left tibia with routine healing 11/12/2012   Asthma 08/13/2011   History of gastroesophageal reflux (GERD) 08/13/2011   Hyperglycemia 08/13/2011   Post-traumatic stress  disorder, unspecified 08/13/2011    PCP: Shelda Atlas, MD  REFERRING PROVIDER: Beverley Evalene BIRCH, MD  REFERRING DIAG: [phone] (ICD-10-CM) - Presence of right artificial knee joint   THERAPY DIAG:  Acute pain of right knee  Muscle weakness (generalized)  Other abnormalities of gait and mobility  Rationale for Evaluation and Treatment: Rehabilitation  ONSET DATE: 08/31/2024  SUBJECTIVE:   SUBJECTIVE STATEMENT: Pt presents to PT with reports of continued R knee pain. Had a good f/u with MD.   EVAL: Pt presents to PT s/p physical therapy evaluation and treatment for R TKA performed by Dr. Beverley on 08/31/2024. On arrival and start of evaluation she notes that she is feeling very dizzy while walking back to gym. We checked her BP which was very low, she states that she has not eaten since noon the previous day. Has had a lot of pain in R knee post surgery, difficult getting around her house and her family is there to help her.   PERTINENT HISTORY: L THA, lumbar  fusion, HTN  PAIN:  Are you having pain?  Yes: NPRS scale: 7/10 Worst: 10/10 Pain location: R anterior knee Pain description: sharp, sore Aggravating factors: transfers, standing, bending Relieving factors: medication   PRECAUTIONS: None  RED FLAGS: None   WEIGHT BEARING RESTRICTIONS: Yes WBAT  FALLS:  Has patient fallen in last 6 months? No  LIVING ENVIRONMENT: Lives with: lives alone Lives in: House/apartment Stairs: Yes: External: 3 steps; bilateral but cannot reach both Has following equipment at home: Single point cane and Walker - 2 wheeled  OCCUPATION: Not working   PLOF: Independent  PATIENT GOALS: improve knee motion, get back to walking recreationally, improve knee ROM  NEXT MD VISIT: 09/15/2024  OBJECTIVE:  Note: Objective measures were completed at Evaluation unless otherwise noted.  DIAGNOSTIC FINDINGS: See imaging   PATIENT SURVEYS:  LEFS  Extreme difficulty/unable (0), Quite a bit  of difficulty (1), Moderate difficulty (2), Little difficulty (3), No difficulty (4) Survey date:  09/03/2024  Any of your usual work, housework or school activities 0  2. Usual hobbies, recreational or sporting activities 0  3. Getting into/out of the bath 0  4. Walking between rooms 0  5. Putting on socks/shoes 0  6. Squatting  0  7. Lifting an object, like a bag of groceries from the floor 0  8. Performing light activities around your home 0  9. Performing heavy activities around your home 0  10. Getting into/out of a car 0  11. Walking 2 blocks 0  12. Walking 1 mile 0  13. Going up/down 10 stairs (1 flight) 0  14. Standing for 1 hour 0  15.  sitting for 1 hour 0  16. Running on even ground 0  17. Running on uneven ground 0  18. Making sharp turns while running fast 0  19. Hopping  0  20. Rolling over in bed 0  Score total:  0/80     COGNITION: Overall cognitive status: Within functional limits for tasks assessed     SENSATION: Light touch: Impaired - lateral R knee  POSTURE: rounded shoulders and forward head and R LE externally rotated   PALPATION: TTP to distal R quad  LOWER EXTREMITY ROM:  Active ROM Right eval Left eval Right 09/07/24 Right 09/09/24 Right 09/14/24 09/16/24  Knee flexion 75 120 88 AAROM 92 AAROM 90 AAROM 98 AA  Knee extension 17 0 17 15 15 9    (Blank rows = not tested)  LOWER EXTREMITY MMT:  MMT Right eval Left eval  Hip flexion    Hip extension    Hip abduction    Hip adduction    Hip internal rotation    Hip external rotation    Knee flexion DNT   Knee extension DNT   Ankle dorsiflexion    Ankle plantarflexion    Ankle inversion    Ankle eversion     (Blank rows = not tested)  FUNCTIONAL TESTS:  30 Second Sit to Stand: 6 reps with UE  TREATMENT: [facility] Adult PT Treatment:                                                DATE: 09/16/2024 BP: 124/74 NuStep lvl 5 UE/LE x 4 min working on knee ROM STS 3x5 BUE support R foot  back Seated heel slide 2x10 - 5 hold LAQ 3x10 R 2#  Supine QS x 10 - 3 hold Supine SLR 2x10 R Supine heel slide with strap 2x10 - 5 hold R  Supine hamstring stretch with strap 2x30 R SAQ 2x15 R Step knee flex stretch x 10 - 5 hold R Step up x 10 R stance B UE 6in in // Modalites: GameReady: Right Knee Medium compression  5 minutes  36 degrees  [facility] Adult PT Treatment:                                                DATE: 09/14/2024 BP: 124/74 NuStep lvl 5 UE/LE x 4 min working on knee ROM STS 2x5 BUE support R foot back Seated hamstring stretch 2x30 R LAQ 2x10 R Supine QS 2x10 - 3 hold Supine heel slide with strap 2x10 - 5 hold R Modalites: GameReady: Right Knee Medium compression  10 minutes  36 degrees  [facility] Adult PT Treatment:                                                DATE: 09/09/2024 BP: 124/74 NuStep lvl 4 UE/LE x 4 min working on knee ROM Seated hamstring stretch 2x30 R LAQ 2x10 R STS 3x5 BUE support  Supine heel slide with strap 2x10 - 5 hold R Supine QS 2x10 - 5 hold SAQ 2x10 R Modalites: GameReady: Right Knee Medium compression  10 minutes  36 degrees  [facility] Adult PT Treatment:                                                DATE: 09/07/2024 BP: 99/54 Supine QS 2x10 - 5 hold Supine heel slide with strap x 10 - 5 hold R Seated hamstring stretch 2x30 R Seated heel slide 2x10 - 5 hold NuStep lvl 3 UE/LE x 4 min working on knee ROM Modalites: GameReady: Right Knee Medium compression  10 minutes  36 degrees  [facility] Adult PT Treatment:                                                DATE: 09/03/2024 Therapeutic Exercise: Supine ankle pumps x 20 R Seated QS x 5 - 5 hold Seated heel slide x 5 - 5 hold R Seated hamstring stretch x 30 R Modalites: Ice to R knee in sitting  PATIENT EDUCATION:  Education details: eval findings, LEFS, HEP, POC Person educated: Patient Education method: Explanation, Demonstration, and  Handouts Education comprehension: verbalized understanding and returned demonstration  HOME EXERCISE PROGRAM: Access Code: [email or web address] URL: [email or web address] Date: 09/14/2024 Prepared by: Alm Kingdom  Exercises - Supine Ankle Pumps  - 8 x daily - 7 x weekly - 2 sets - 20-30 reps - Long Sitting Quad Set  - 4-5 x daily - 7 x weekly - 3 sets - 10 reps - 5 sec hold - Seated Heel Slide  - 4-5 x daily - 7 x weekly - 2-3 sets - 10 reps - 5 sec hold - Seated Hamstring Stretch  -  4-5 x daily - 7 x weekly - 2-3 reps - 30 sec hold - Seated Knee Extension Stretch with Chair  - 3 x daily - 7 x weekly - 2 min hold  ASSESSMENT:  CLINICAL IMPRESSION: Pt was able to complete all prescribed exercises with no adverse effect. We focused on continued exercises for TKA rehab focused on knee flexion and extension ROM. Showed improved ROM in each direction. Continues to benefit from skilled PT services, will continue per POC.   EVAL: Patient is a 58 y.o. F who was seen today s/p physical therapy evaluation and treatment for R TKA performed by Dr. Beverley on 08/31/2024. Physical findings are consistent with surgery and recovery timeline as pt demonstrates decreased R knee ROM, strength, and functional mobility. Today we could not assess or treatment very much due to symptoms of dizziness and orthostasis, measured BP twice which was very low and had no symptom resolvement with gingerale and simple sugars. PT assisted pt back to car via w/c, she refused ED transport and notes that she needs to eat something. LEFS shows shows severe disability in performance of home ADLs and community activities. Pt would benefit from skilled PT services working on improving knee ROM, strength, and gait post TKA. Will assess vitals and symptoms of orthostasis next session.   OBJECTIVE IMPAIRMENTS: Abnormal gait, decreased activity tolerance, decreased balance, decreased endurance, decreased mobility, difficulty walking,  decreased ROM, decreased strength, and pain.   ACTIVITY LIMITATIONS: carrying, lifting, bending, sitting, standing, squatting, stairs, transfers, bed mobility, and locomotion level  PARTICIPATION LIMITATIONS: meal prep, cleaning, driving, shopping, community activity, occupation, yard work, and school  PERSONAL FACTORS: Fitness and 3+ comorbidities: L THA, lumbar fusion, HTN are also affecting patient's functional outcome.   REHAB POTENTIAL: Good  CLINICAL DECISION MAKING: Evolving/moderate complexity  EVALUATION COMPLEXITY: Moderate   GOALS: Goals reviewed with patient? No  SHORT TERM GOALS: Target date: 09/24/2024   Pt will be compliant and knowledgeable with initial HEP for improved comfort and carryover Baseline: initial HEP given  Goal status: INITIAL  2.  Pt will self report right knee pain no greater than 8/10 for improved comfort and functional ability Baseline: 10/10 at worst Goal status: INITIAL   LONG TERM GOALS: Target date: 11/26/2024   Pt will improve LEFS to no less than 30/80 as proxy for functional improvement with home ADLs and higher level community activity Baseline: 0/80 Goal status: INITIAL   2.  Pt will self report right knee pain no greater than 6/10 for improved comfort and functional ability Baseline: 10/10 at worst Goal status: INITIAL   3.  Pt will increase 30 Second Sit to Stand rep count by no less than 2 reps for improved balance, strength, and functional mobility Baseline: deferred due to BP  Goal status: INITIAL   4.  Pt will improve R knee AROM to at least range of 3-115 degrees for improved comfort and functional mobility Baseline: 17-75 Goal status: INITIAL  5.  Pt will be able to walk recreationally with no increase in R knee pain for improved comfort and return to desired recreational activity Baseline:  Goal status: INITIAL   PLAN:  PT FREQUENCY: 2x/week  PT DURATION: 12 weeks  PLANNED INTERVENTIONS: [id number]- PT  Re-evaluation, 97110-Therapeutic exercises, 97530- Therapeutic activity, [id number]- Neuromuscular re-education, 97535- Self Care, [id number]- Manual therapy, [id number]- Gait training, [id number]- Electrical stimulation (unattended), [id number]- Electrical stimulation (manual), 97016- Vasopneumatic device, and Patient/Family education  PLAN FOR NEXT SESSION: assess BP, HEP, R knee ROM and  gait, quad strength  For all possible CPT codes, reference the Planned Interventions line above.     Check all conditions that are expected to impact treatment: {Conditions expected to impact treatment:Musculoskeletal disorders   If treatment provided at initial evaluation, no treatment charged due to lack of authorization.       Alm JAYSON Kingdom, PT 09/16/2024, 12:01 PM

## 2024-09-20 ENCOUNTER — Ambulatory Visit

## 2024-09-21 ENCOUNTER — Ambulatory Visit

## 2024-09-22 ENCOUNTER — Ambulatory Visit

## 2024-09-22 DIAGNOSIS — M25561 Pain in right knee: Secondary | ICD-10-CM | POA: Diagnosis not present

## 2024-09-22 DIAGNOSIS — R2689 Other abnormalities of gait and mobility: Secondary | ICD-10-CM

## 2024-09-22 DIAGNOSIS — M6281 Muscle weakness (generalized): Secondary | ICD-10-CM

## 2024-09-22 NOTE — Therapy (Signed)
 OUTPATIENT PHYSICAL THERAPY TREATMENT   Patient Name: Alicia Blake MRN: [id number] DOB:08/31/1966, 58 y.o., female Today's Date: 09/22/2024  END OF SESSION:  PT End of Session - 09/22/24 1049     Visit Number 6    Number of Visits 24    Date for Recertification  11/26/24    Authorization Type [employer]    Authorization Time Period Approved 10 PT visits from 09/03/24-12/01/24    Authorization - Visit Number 5    Authorization - Number of Visits 10    PT Start Time 1052    PT Stop Time 1130    PT Time Calculation (min) 38 min    Activity Tolerance Patient limited by pain;Patient limited by lethargy;Other (comment)    Behavior During Therapy WFL for tasks assessed/performed              Past Medical History:  Diagnosis Date   Asthma    Bipolar affect, depressed (HCC)    Bronchitis    Chronic back pain    Closed left ankle fracture    COPD (chronic obstructive pulmonary disease) (HCC)    DJD (degenerative joint disease)    BACK   GERD (gastroesophageal reflux disease)    Hypertension    Pneumonia    Pre-diabetes    dr entered diabetes without complications- no meds does not test sugar at home   PTSD (post-traumatic stress disorder)    Scoliosis    Seizures (HCC)    when drinking last yrs ago   Shortness of breath dyspnea    exersion   Substance abuse (HCC)    crack cocaine, 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, none since   Thyroid disease    UTI (lower urinary tract infection)    Past Surgical History:  Procedure Laterality Date   2 LEFT TOES SURGERY  12/2014   finished only on right side   ABDOMINAL EXPOSURE N/A 03/26/2023   Procedure: ABDOMINAL EXPOSURE;  Surgeon: Gretta Lonni PARAS, MD;  Location: [facility] OR;  Service: Vascular;  Laterality: N/A;   ABDOMINAL EXPOSURE N/A 04/02/2023   Procedure: ABDOMINAL EXPOSURE;  Surgeon: Gretta Lonni PARAS, MD;  Location: [facility] OR;  Service: Vascular;  Laterality: N/A;   ANTERIOR LUMBAR FUSION N/A 03/26/2023   Procedure:  REMOVAL OF POSTERIOR HARDWARE;  Surgeon: Beuford Anes, MD;  Location: [facility] OR;  Service: Orthopedics;  Laterality: N/A;   ANTERIOR LUMBAR FUSION N/A 03/26/2023   Procedure: LUMBAR FOUR - LUMBAR FIVE, LUMBAR FIVE - SACRUM ONE ANTERIOR LUMBAR INTERBODY FUSION WITH INSTRUMENTATION AND ALLOGRAFT;  Surgeon: Beuford Anes, MD;  Location: [facility] OR;  Service: Orthopedics;  Laterality: N/A;   ANTERIOR LUMBAR FUSION N/A 04/02/2023   Procedure: REVISION ANTERIOR LUMBAR FUSION L5-S1;  Surgeon: Beuford Anes, MD;  Location: [facility] OR;  Service: Orthopedics;  Laterality: N/A;   BACK SURGERY     BALLOON DILATION N/A 12/14/2015   Procedure: BALLOON DILATION;  Surgeon: Norleen Hint, MD;  Location: [facility];  Service: Endoscopy;  Laterality: N/A;   COLONOSCOPY WITH PROPOFOL  N/A 12/14/2015   Procedure: COLONOSCOPY WITH PROPOFOL ;  Surgeon: Norleen Hint, MD;  Location: [facility];  Service: Endoscopy;  Laterality: N/A;   ESOPHAGOGASTRODUODENOSCOPY (EGD) WITH PROPOFOL  N/A 12/14/2015   Procedure: ESOPHAGOGASTRODUODENOSCOPY (EGD) WITH PROPOFOL ;  Surgeon: Norleen Hint, MD;  Location: [facility];  Service: Endoscopy;  Laterality: N/A;   FRACTURE SURGERY     JOINT REPLACEMENT     left hip   LEFT ANKLE SURGERY  12/2014   LUMBAR LAMINECTOMY/DECOMPRESSION MICRODISCECTOMY N/A 06/18/2017  Procedure: LAMINECTOMY AND FORAMINOTOMY LUMBAR FOUR - LUMBAR FIVE;  Surgeon: Gillie Duncans, MD;  Location: [facility] OR;  Service: Neurosurgery;  Laterality: N/A;  LAMINECTOMY AND FORAMINOTOMY LUMBAR 4- LUMBAR 5   LUMBAR WOUND DEBRIDEMENT N/A 03/10/2020   Procedure: Lumbar Wound Debridement;  Surgeon: Gillie Duncans, MD;  Location[facility] OR;  Service: Neurosurgery;  Laterality: N/A;  posterior   NECK SURGERY  AS TEENAGER   STITCHES TO NECK    ORIF ANKLE FRACTURE Left 07/24/2021   Procedure: OPEN REDUCTION INTERNAL FIXATION (ORIF) ANKLE FRACTURE;  Surgeon: Beverley Evalene BIRCH, MD;  Location: [facility];  Service: Orthopedics;  Laterality: Left;    SURGERY FOR CUT ON STOMACH  TEENAGER   TOTAL HIP ARTHROPLASTY Left 05/14/2016   Procedure: TOTAL HIP ARTHROPLASTY ANTERIOR APPROACH;  Surgeon: Evalene BIRCH Beverley, MD;  Location: [facility] OR;  Service: Orthopedics;  Laterality: Left;   TOTAL KNEE ARTHROPLASTY Right 08/31/2024   Procedure: ARTHROPLASTY, KNEE, TOTAL;  Surgeon: Beverley Evalene BIRCH, MD;  Location: [facility];  Service: Orthopedics;  Laterality: Right;   Patient Active Problem List   Diagnosis Date Noted   S/P total knee arthroplasty, right 08/31/2024   Radiculopathy, lumbar region 03/26/2023   Acid reflux 11/07/2022   Lumbar foraminal stenosis 04/21/2020   Tongue lesion 03/04/2019   Macromastia 08/11/2018   Laryngopharyngeal reflux (LPR) 12/16/2017   Pharyngeal dysphagia 12/16/2017   Right ear impacted cerumen 12/16/2017   Lumbar stenosis with neurogenic claudication 06/18/2017   Bilateral sensorineural hearing loss 10/14/2016   Subjective tinnitus, bilateral 10/14/2016   Tobacco abuse 04/19/2016   Primary osteoarthritis of left hip 04/18/2016   Opioid use disorder, mild, abuse (HCC) 09/08/2015   Schizoaffective disorder, bipolar type (HCC) 09/07/2015   Chronic pain syndrome 09/07/2015   Alcohol use disorder, moderate, in sustained remission (HCC) 09/07/2015   Cocaine use disorder, moderate, in sustained remission (HCC) 09/07/2015   Cannabis use disorder, moderate, in sustained remission (HCC) 09/07/2015   DDD (degenerative disc disease), lumbar 09/07/2015   Hx of fracture of ankle 09/07/2015   Arthropathy of ankle and foot 03/04/2014   Hammertoe 03/04/2014   Cystocele, unspecified 09/03/2013   Symptomatic Fibroids 08/09/2013   Breast lump on left side at 9 o'clock position 06/29/2013   Breast discharge 06/29/2013   Closed disp fx of lateral condyle of left tibia with routine healing 11/12/2012   Asthma 08/13/2011   History of gastroesophageal reflux (GERD) 08/13/2011   Hyperglycemia 08/13/2011   Post-traumatic stress  disorder, unspecified 08/13/2011    PCP: Shelda Atlas, MD  REFERRING PROVIDER: Beverley Evalene BIRCH, MD  REFERRING DIAG: [id number] (ICD-10-CM) - Presence of right artificial knee joint   THERAPY DIAG:  Acute pain of right knee  Muscle weakness (generalized)  Other abnormalities of gait and mobility  Rationale for Evaluation and Treatment: Rehabilitation  ONSET DATE: 08/31/2024  SUBJECTIVE:   SUBJECTIVE STATEMENT: Pt presents to PT with reports of continued R knee pain and tightness. Has been compliant with HEP with no adverse effect.   EVAL: Pt presents to PT s/p physical therapy evaluation and treatment for R TKA performed by Dr. Beverley on 08/31/2024. On arrival and start of evaluation she notes that she is feeling very dizzy while walking back to gym. We checked her BP which was very low, she states that she has not eaten since noon the previous day. Has had a lot of pain in R knee post surgery, difficult getting around her house and her family is there to help her.  PERTINENT HISTORY: L THA, lumbar fusion, HTN  PAIN:  Are you having pain?  Yes: NPRS scale: 8/10 Worst: 10/10 Pain location: R anterior knee Pain description: sharp, sore Aggravating factors: transfers, standing, bending Relieving factors: medication   PRECAUTIONS: None  RED FLAGS: None   WEIGHT BEARING RESTRICTIONS: Yes WBAT  FALLS:  Has patient fallen in last 6 months? No  LIVING ENVIRONMENT: Lives with: lives alone Lives in: House/apartment Stairs: Yes: External: 3 steps; bilateral but cannot reach both Has following equipment at home: Single point cane and Walker - 2 wheeled  OCCUPATION: Not working   PLOF: Independent  PATIENT GOALS: improve knee motion, get back to walking recreationally, improve knee ROM  NEXT MD VISIT: 09/15/2024  OBJECTIVE:  Note: Objective measures were completed at Evaluation unless otherwise noted.  DIAGNOSTIC FINDINGS: See imaging   PATIENT SURVEYS:  LEFS   Extreme difficulty/unable (0), Quite a bit of difficulty (1), Moderate difficulty (2), Little difficulty (3), No difficulty (4) Survey date:  09/03/2024  Any of your usual work, housework or school activities 0  2. Usual hobbies, recreational or sporting activities 0  3. Getting into/out of the bath 0  4. Walking between rooms 0  5. Putting on socks/shoes 0  6. Squatting  0  7. Lifting an object, like a bag of groceries from the floor 0  8. Performing light activities around your home 0  9. Performing heavy activities around your home 0  10. Getting into/out of a car 0  11. Walking 2 blocks 0  12. Walking 1 mile 0  13. Going up/down 10 stairs (1 flight) 0  14. Standing for 1 hour 0  15.  sitting for 1 hour 0  16. Running on even ground 0  17. Running on uneven ground 0  18. Making sharp turns while running fast 0  19. Hopping  0  20. Rolling over in bed 0  Score total:  0/80     COGNITION: Overall cognitive status: Within functional limits for tasks assessed     SENSATION: Light touch: Impaired - lateral R knee  POSTURE: rounded shoulders and forward head and R LE externally rotated   PALPATION: TTP to distal R quad  LOWER EXTREMITY ROM:  Active ROM Right eval Left eval Right 09/07/24 Right 09/09/24 Right 09/14/24 09/16/24 09/22/24  Knee flexion 75 120 88 AAROM 92 AAROM 90 AAROM 98 AA 106  Knee extension 17 0 17 15 15 9     (Blank rows = not tested)  LOWER EXTREMITY MMT:  MMT Right eval Left eval  Hip flexion    Hip extension    Hip abduction    Hip adduction    Hip internal rotation    Hip external rotation    Knee flexion DNT   Knee extension DNT   Ankle dorsiflexion    Ankle plantarflexion    Ankle inversion    Ankle eversion     (Blank rows = not tested)  FUNCTIONAL TESTS:  30 Second Sit to Stand: 6 reps with UE  TREATMENT: [facility] Adult PT Treatment:                                                DATE: 09/22/2024 BP: 124/74 NuStep lvl 5 UE/LE  x 4 min working on knee ROM LAQ 3x10 3# R STS 2x10 BUE support R foot  back Supine QS x 10 - 3 hold Supine SLR 2x10 R Supine heel slide with strap 2x10 - 5 hold R  Supine hamstring stretch with strap 3x30 R SAQ 2x15 R Step knee flex stretch x 10 - 5 hold R Step up 2x10 R stance B UE 6in in // Standing hip abd 2x10 ea Modalites: GameReady: Right Knee Medium compression  5 minutes  36 degrees  [facility] Adult PT Treatment:                                                DATE: 09/16/2024 BP: 124/74 NuStep lvl 5 UE/LE x 4 min working on knee ROM STS 3x5 BUE support R foot back Seated heel slide 2x10 - 5 hold LAQ 3x10 R 2# Supine QS x 10 - 3 hold Supine SLR 2x10 R Supine heel slide with strap 2x10 - 5 hold R  Supine hamstring stretch with strap 2x30 R SAQ 2x15 R Step knee flex stretch x 10 - 5 hold R Step up x 10 R stance B UE 6in in // Modalites: GameReady: Right Knee Medium compression  5 minutes  36 degrees  [facility] Adult PT Treatment:                                                DATE: 09/14/2024 BP: 124/74 NuStep lvl 5 UE/LE x 4 min working on knee ROM STS 2x5 BUE support R foot back Seated hamstring stretch 2x30 R LAQ 2x10 R Supine QS 2x10 - 3 hold Supine heel slide with strap 2x10 - 5 hold R Modalites: GameReady: Right Knee Medium compression  10 minutes  36 degrees  [facility] Adult PT Treatment:                                                DATE: 09/09/2024 BP: 124/74 NuStep lvl 4 UE/LE x 4 min working on knee ROM Seated hamstring stretch 2x30 R LAQ 2x10 R STS 3x5 BUE support  Supine heel slide with strap 2x10 - 5 hold R Supine QS 2x10 - 5 hold SAQ 2x10 R Modalites: GameReady: Right Knee Medium compression  10 minutes  36 degrees  [facility] Adult PT Treatment:                                                DATE: 09/07/2024 BP: 99/54 Supine QS 2x10 - 5 hold Supine heel slide with strap x 10 - 5 hold R Seated hamstring stretch 2x30 R Seated  heel slide 2x10 - 5 hold NuStep lvl 3 UE/LE x 4 min working on knee ROM Modalites: GameReady: Right Knee Medium compression  10 minutes  36 degrees  [facility] Adult PT Treatment:  DATE: 09/03/2024 Therapeutic Exercise: Supine ankle pumps x 20 R Seated QS x 5 - 5 hold Seated heel slide x 5 - 5 hold R Seated hamstring stretch x 30 R Modalites: Ice to R knee in sitting  PATIENT EDUCATION:  Education details: eval findings, LEFS, HEP, POC Person educated: Patient Education method: Explanation, Demonstration, and Handouts Education comprehension: verbalized understanding and returned demonstration  HOME EXERCISE PROGRAM: Access Code: [email or web address] URL: [email or web address] Date: 09/14/2024 Prepared by: Alm Kingdom  Exercises - Supine Ankle Pumps  - 8 x daily - 7 x weekly - 2 sets - 20-30 reps - Long Sitting Quad Set  - 4-5 x daily - 7 x weekly - 3 sets - 10 reps - 5 sec hold - Seated Heel Slide  - 4-5 x daily - 7 x weekly - 2-3 sets - 10 reps - 5 sec hold - Seated Hamstring Stretch  - 4-5 x daily - 7 x weekly - 2-3 reps - 30 sec hold - Seated Knee Extension Stretch with Chair  - 3 x daily - 7 x weekly - 2 min hold  ASSESSMENT:  CLINICAL IMPRESSION: Pt was able to complete all prescribed exercises with no adverse effect. We focused on continued exercises for TKA rehab focused on knee flexion and extension ROM. Showed improved ROM in each direction. Continues to benefit from skilled PT services, will continue per POC.   EVAL: Patient is a 58 y.o. F who was seen today s/p physical therapy evaluation and treatment for R TKA performed by Dr. Beverley on 08/31/2024. Physical findings are consistent with surgery and recovery timeline as pt demonstrates decreased R knee ROM, strength, and functional mobility. Today we could not assess or treatment very much due to symptoms of dizziness and orthostasis, measured BP twice which was very  low and had no symptom resolvement with gingerale and simple sugars. PT assisted pt back to car via w/c, she refused ED transport and notes that she needs to eat something. LEFS shows shows severe disability in performance of home ADLs and community activities. Pt would benefit from skilled PT services working on improving knee ROM, strength, and gait post TKA. Will assess vitals and symptoms of orthostasis next session.   OBJECTIVE IMPAIRMENTS: Abnormal gait, decreased activity tolerance, decreased balance, decreased endurance, decreased mobility, difficulty walking, decreased ROM, decreased strength, and pain.   ACTIVITY LIMITATIONS: carrying, lifting, bending, sitting, standing, squatting, stairs, transfers, bed mobility, and locomotion level  PARTICIPATION LIMITATIONS: meal prep, cleaning, driving, shopping, community activity, occupation, yard work, and school  PERSONAL FACTORS: Fitness and 3+ comorbidities: L THA, lumbar fusion, HTN are also affecting patient's functional outcome.   REHAB POTENTIAL: Good  CLINICAL DECISION MAKING: Evolving/moderate complexity  EVALUATION COMPLEXITY: Moderate   GOALS: Goals reviewed with patient? No  SHORT TERM GOALS: Target date: 09/24/2024   Pt will be compliant and knowledgeable with initial HEP for improved comfort and carryover Baseline: initial HEP given  Goal status: INITIAL  2.  Pt will self report right knee pain no greater than 8/10 for improved comfort and functional ability Baseline: 10/10 at worst Goal status: INITIAL   LONG TERM GOALS: Target date: 11/26/2024   Pt will improve LEFS to no less than 30/80 as proxy for functional improvement with home ADLs and higher level community activity Baseline: 0/80 Goal status: INITIAL   2.  Pt will self report right knee pain no greater than 6/10 for improved comfort and functional ability Baseline: 10/10 at worst Goal status: INITIAL  3.  Pt will increase 30 Second Sit to Stand rep  count by no less than 2 reps for improved balance, strength, and functional mobility Baseline: deferred due to BP  Goal status: INITIAL   4.  Pt will improve R knee AROM to at least range of 3-115 degrees for improved comfort and functional mobility Baseline: 17-75 Goal status: INITIAL  5.  Pt will be able to walk recreationally with no increase in R knee pain for improved comfort and return to desired recreational activity Baseline:  Goal status: INITIAL   PLAN:  PT FREQUENCY: 2x/week  PT DURATION: 12 weeks  PLANNED INTERVENTIONS: 97164- PT Re-evaluation, 97110-Therapeutic exercises, 97530- Therapeutic activity, [id number]- Neuromuscular re-education, 97535- Self Care, [id number]- Manual therapy, [id number]- Gait training, [id number]- Electrical stimulation (unattended), [id number]- Electrical stimulation (manual), 97016- Vasopneumatic device, and Patient/Family education  PLAN FOR NEXT SESSION: assess BP, HEP, R knee ROM and gait, quad strength  For all possible CPT codes, reference the Planned Interventions line above.     Check all conditions that are expected to impact treatment: {Conditions expected to impact treatment:Musculoskeletal disorders   If treatment provided at initial evaluation, no treatment charged due to lack of authorization.       Alm JAYSON Kingdom, PT 09/22/2024, 11:41 AM

## 2024-09-30 ENCOUNTER — Encounter: Payer: Self-pay | Admitting: Physical Therapy

## 2024-09-30 ENCOUNTER — Ambulatory Visit: Admitting: Physical Therapy

## 2024-09-30 DIAGNOSIS — M25561 Pain in right knee: Secondary | ICD-10-CM | POA: Diagnosis present

## 2024-09-30 DIAGNOSIS — M6281 Muscle weakness (generalized): Secondary | ICD-10-CM | POA: Diagnosis present

## 2024-09-30 DIAGNOSIS — R2689 Other abnormalities of gait and mobility: Secondary | ICD-10-CM | POA: Insufficient documentation

## 2024-09-30 NOTE — Therapy (Signed)
 OUTPATIENT PHYSICAL THERAPY TREATMENT   Patient Name: Alicia Blake MRN: [id number] DOB:06-10-1966, 58 y.o., female Today's Date: 09/30/2024  END OF SESSION:  PT End of Session - 09/30/24 1015     Visit Number 7    Number of Visits 24    Date for Recertification  11/26/24    Authorization Type [employer]    Authorization Time Period Approved 10 PT visits from 09/03/24-12/01/24    Authorization - Visit Number 6    Authorization - Number of Visits 10    PT Start Time 1015    PT Stop Time 1100    PT Time Calculation (min) 45 min              Past Medical History:  Diagnosis Date   Asthma    Bipolar affect, depressed (HCC)    Bronchitis    Chronic back pain    Closed left ankle fracture    COPD (chronic obstructive pulmonary disease) (HCC)    DJD (degenerative joint disease)    BACK   GERD (gastroesophageal reflux disease)    Hypertension    Pneumonia    Pre-diabetes    dr entered diabetes without complications- no meds does not test sugar at home   PTSD (post-traumatic stress disorder)    Scoliosis    Seizures (HCC)    when drinking last yrs ago   Shortness of breath dyspnea    exersion   Substance abuse (HCC)    crack cocaine, 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, none since   Thyroid disease    UTI (lower urinary tract infection)    Past Surgical History:  Procedure Laterality Date   2 LEFT TOES SURGERY  12/2014   finished only on right side   ABDOMINAL EXPOSURE N/A 03/26/2023   Procedure: ABDOMINAL EXPOSURE;  Surgeon: Gretta Lonni PARAS, MD;  Location: [facility] OR;  Service: Vascular;  Laterality: N/A;   ABDOMINAL EXPOSURE N/A 04/02/2023   Procedure: ABDOMINAL EXPOSURE;  Surgeon: Gretta Lonni PARAS, MD;  Location[facility] OR;  Service: Vascular;  Laterality: N/A;   ANTERIOR LUMBAR FUSION N/A 03/26/2023   Procedure: REMOVAL OF POSTERIOR HARDWARE;  Surgeon: Beuford Anes, MD;  Location: [facility] OR;  Service: Orthopedics;  Laterality: N/A;   ANTERIOR LUMBAR FUSION N/A  03/26/2023   Procedure: LUMBAR FOUR - LUMBAR FIVE, LUMBAR FIVE - SACRUM ONE ANTERIOR LUMBAR INTERBODY FUSION WITH INSTRUMENTATION AND ALLOGRAFT;  Surgeon: Beuford Anes, MD;  Location: [facility] OR;  Service: Orthopedics;  Laterality: N/A;   ANTERIOR LUMBAR FUSION N/A 04/02/2023   Procedure: REVISION ANTERIOR LUMBAR FUSION L5-S1;  Surgeon: Beuford Anes, MD;  Location: [facility] OR;  Service: Orthopedics;  Laterality: N/A;   BACK SURGERY     BALLOON DILATION N/A 12/14/2015   Procedure: BALLOON DILATION;  Surgeon: Norleen Hint, MD;  Location: [facility];  Service: Endoscopy;  Laterality: N/A;   COLONOSCOPY WITH PROPOFOL  N/A 12/14/2015   Procedure: COLONOSCOPY WITH PROPOFOL ;  Surgeon: Norleen Hint, MD;  Location: [facility];  Service: Endoscopy;  Laterality: N/A;   ESOPHAGOGASTRODUODENOSCOPY (EGD) WITH PROPOFOL  N/A 12/14/2015   Procedure: ESOPHAGOGASTRODUODENOSCOPY (EGD) WITH PROPOFOL ;  Surgeon: Norleen Hint, MD;  Location: [facility];  Service: Endoscopy;  Laterality: N/A;   FRACTURE SURGERY     JOINT REPLACEMENT     left hip   LEFT ANKLE SURGERY  12/2014   LUMBAR LAMINECTOMY/DECOMPRESSION MICRODISCECTOMY N/A 06/18/2017   Procedure: LAMINECTOMY AND FORAMINOTOMY LUMBAR FOUR - LUMBAR FIVE;  Surgeon: Gillie Duncans, MD;  Location: [facility] OR;  Service: Neurosurgery;  Laterality: N/A;  LAMINECTOMY AND FORAMINOTOMY LUMBAR 4- LUMBAR 5   LUMBAR WOUND DEBRIDEMENT N/A 03/10/2020   Procedure: Lumbar Wound Debridement;  Surgeon: Gillie Duncans, MD;  Location: [facility] OR;  Service: Neurosurgery;  Laterality: N/A;  posterior   NECK SURGERY  AS TEENAGER   STITCHES TO NECK    ORIF ANKLE FRACTURE Left 07/24/2021   Procedure: OPEN REDUCTION INTERNAL FIXATION (ORIF) ANKLE FRACTURE;  Surgeon: Beverley Evalene BIRCH, MD;  Location: [facility];  Service: Orthopedics;  Laterality: Left;   SURGERY FOR CUT ON STOMACH  TEENAGER   TOTAL HIP ARTHROPLASTY Left 05/14/2016   Procedure: TOTAL HIP ARTHROPLASTY ANTERIOR APPROACH;  Surgeon:  Evalene BIRCH Beverley, MD;  Location: [facility] OR;  Service: Orthopedics;  Laterality: Left;   TOTAL KNEE ARTHROPLASTY Right 08/31/2024   Procedure: ARTHROPLASTY, KNEE, TOTAL;  Surgeon: Beverley Evalene BIRCH, MD;  Location: [facility];  Service: Orthopedics;  Laterality: Right;   Patient Active Problem List   Diagnosis Date Noted   S/P total knee arthroplasty, right 08/31/2024   Radiculopathy, lumbar region 03/26/2023   Acid reflux 11/07/2022   Lumbar foraminal stenosis 04/21/2020   Tongue lesion 03/04/2019   Macromastia 08/11/2018   Laryngopharyngeal reflux (LPR) 12/16/2017   Pharyngeal dysphagia 12/16/2017   Right ear impacted cerumen 12/16/2017   Lumbar stenosis with neurogenic claudication 06/18/2017   Bilateral sensorineural hearing loss 10/14/2016   Subjective tinnitus, bilateral 10/14/2016   Tobacco abuse 04/19/2016   Primary osteoarthritis of left hip 04/18/2016   Opioid use disorder, mild, abuse (HCC) 09/08/2015   Schizoaffective disorder, bipolar type (HCC) 09/07/2015   Chronic pain syndrome 09/07/2015   Alcohol use disorder, moderate, in sustained remission (HCC) 09/07/2015   Cocaine use disorder, moderate, in sustained remission (HCC) 09/07/2015   Cannabis use disorder, moderate, in sustained remission (HCC) 09/07/2015   DDD (degenerative disc disease), lumbar 09/07/2015   Hx of fracture of ankle 09/07/2015   Arthropathy of ankle and foot 03/04/2014   Hammertoe 03/04/2014   Cystocele, unspecified 09/03/2013   Symptomatic Fibroids 08/09/2013   Breast lump on left side at 9 o'clock position 06/29/2013   Breast discharge 06/29/2013   Closed disp fx of lateral condyle of left tibia with routine healing 11/12/2012   Asthma 08/13/2011   History of gastroesophageal reflux (GERD) 08/13/2011   Hyperglycemia 08/13/2011   Post-traumatic stress disorder, unspecified 08/13/2011    PCP: Shelda Atlas, MD  REFERRING PROVIDER: Beverley Evalene BIRCH, MD  REFERRING DIAG: [phone] (ICD-10-CM) -  Presence of right artificial knee joint   THERAPY DIAG:  Acute pain of right knee  Muscle weakness (generalized)  Other abnormalities of gait and mobility  Rationale for Evaluation and Treatment: Rehabilitation  ONSET DATE: 08/31/2024  SUBJECTIVE:   SUBJECTIVE STATEMENT: Pt presents to PT with reports of continued R knee pain and tightness but endorses improvement. Has been compliant with HEP with no adverse effect.   EVAL: Pt presents to PT s/p physical therapy evaluation and treatment for R TKA performed by Dr. Beverley on 08/31/2024. On arrival and start of evaluation she notes that she is feeling very dizzy while walking back to gym. We checked her BP which was very low, she states that she has not eaten since noon the previous day. Has had a lot of pain in R knee post surgery, difficult getting around her house and her family is there to help her.   PERTINENT HISTORY: L THA, lumbar fusion, HTN  PAIN:  Are you having pain?  Yes: NPRS scale: 7/10  Worst: 7-8/10 Pain location: R anterior knee Pain description: sharp, sore Aggravating factors: transfers, standing, bending Relieving factors: medication   PRECAUTIONS: None  RED FLAGS: None   WEIGHT BEARING RESTRICTIONS: Yes WBAT  FALLS:  Has patient fallen in last 6 months? No  LIVING ENVIRONMENT: Lives with: lives alone Lives in: House/apartment Stairs: Yes: External: 3 steps; bilateral but cannot reach both Has following equipment at home: Single point cane and Walker - 2 wheeled  OCCUPATION: Not working   PLOF: Independent  PATIENT GOALS: improve knee motion, get back to walking recreationally, improve knee ROM  NEXT MD VISIT: 09/15/2024  OBJECTIVE:  Note: Objective measures were completed at Evaluation unless otherwise noted.  DIAGNOSTIC FINDINGS: See imaging   PATIENT SURVEYS:  LEFS  Extreme difficulty/unable (0), Quite a bit of difficulty (1), Moderate difficulty (2), Little difficulty (3), No difficulty  (4) Survey date:  09/03/2024  Any of your usual work, housework or school activities 0  2. Usual hobbies, recreational or sporting activities 0  3. Getting into/out of the bath 0  4. Walking between rooms 0  5. Putting on socks/shoes 0  6. Squatting  0  7. Lifting an object, like a bag of groceries from the floor 0  8. Performing light activities around your home 0  9. Performing heavy activities around your home 0  10. Getting into/out of a car 0  11. Walking 2 blocks 0  12. Walking 1 mile 0  13. Going up/down 10 stairs (1 flight) 0  14. Standing for 1 hour 0  15.  sitting for 1 hour 0  16. Running on even ground 0  17. Running on uneven ground 0  18. Making sharp turns while running fast 0  19. Hopping  0  20. Rolling over in bed 0  Score total:  0/80     COGNITION: Overall cognitive status: Within functional limits for tasks assessed     SENSATION: Light touch: Impaired - lateral R knee  POSTURE: rounded shoulders and forward head and R LE externally rotated   PALPATION: TTP to distal R quad  LOWER EXTREMITY ROM:  Active ROM Right eval Left eval Right 09/07/24 Right 09/09/24 Right 09/14/24 09/16/24 09/22/24 09/30/24  Knee flexion 75 120 88 AAROM 92 AAROM 90 AAROM 98 AA 106 110  Knee extension 17 0 17 15 15 9      (Blank rows = not tested)  LOWER EXTREMITY MMT:  MMT Right eval Left eval  Hip flexion    Hip extension    Hip abduction    Hip adduction    Hip internal rotation    Hip external rotation    Knee flexion DNT   Knee extension DNT   Ankle dorsiflexion    Ankle plantarflexion    Ankle inversion    Ankle eversion     (Blank rows = not tested)  FUNCTIONAL TESTS:  30 Second Sit to Stand: 6 reps with UE  TREATMENT: [facility] Adult PT Treatment:                                                DATE: 09/30/24  Nustep L5 x 6 minutes Ue/LE Step up 6 inch 10 x 2 1 UE support  Seated LAQ 3# 10 x 2 right  Seated h/s curl red band 10 x 2 right  Supine  SAQ 4#  10 x 2  SLR 10 x 2 e Heel slide AAROM  H.s stretch with strap   Modalities: Declined     [facility] Adult PT Treatment:                                                DATE: 09/22/2024 BP: 124/74 NuStep lvl 5 UE/LE x 4 min working on knee ROM LAQ 3x10 3# R STS 2x10 BUE support R foot back Supine QS x 10 - 3 hold Supine SLR 2x10 R Supine heel slide with strap 2x10 - 5 hold R  Supine hamstring stretch with strap 3x30 R SAQ 2x15 R Step knee flex stretch x 10 - 5 hold R Step up 2x10 R stance B UE 6in in // Standing hip abd 2x10 ea Modalites: GameReady: Right Knee Medium compression  5 minutes  36 degrees  [facility] Adult PT Treatment:                                                DATE: 09/16/2024 BP: 124/74 NuStep lvl 5 UE/LE x 4 min working on knee ROM STS 3x5 BUE support R foot back Seated heel slide 2x10 - 5 hold LAQ 3x10 R 2# Supine QS x 10 - 3 hold Supine SLR 2x10 R Supine heel slide with strap 2x10 - 5 hold R  Supine hamstring stretch with strap 2x30 R SAQ 2x15 R Step knee flex stretch x 10 - 5 hold R Step up x 10 R stance B UE 6in in // Modalites: GameReady: Right Knee Medium compression  5 minutes  36 degrees  [facility] Adult PT Treatment:                                                DATE: 09/14/2024 BP: 124/74 NuStep lvl 5 UE/LE x 4 min working on knee ROM STS 2x5 BUE support R foot back Seated hamstring stretch 2x30 R LAQ 2x10 R Supine QS 2x10 - 3 hold Supine heel slide with strap 2x10 - 5 hold R Modalites: GameReady: Right Knee Medium compression  10 minutes  36 degrees  [facility] Adult PT Treatment:                                                DATE: 09/09/2024 BP: 124/74 NuStep lvl 4 UE/LE x 4 min working on knee ROM Seated hamstring stretch 2x30 R LAQ 2x10 R STS 3x5 BUE support  Supine heel slide with strap 2x10 - 5 hold R Supine QS 2x10 - 5 hold SAQ 2x10 R Modalites: GameReady: Right Knee Medium compression  10 minutes  36  degrees  [facility] Adult PT Treatment:                                                DATE: 09/07/2024 BP: 99/54 Supine  QS 2x10 - 5 hold Supine heel slide with strap x 10 - 5 hold R Seated hamstring stretch 2x30 R Seated heel slide 2x10 - 5 hold NuStep lvl 3 UE/LE x 4 min working on knee ROM Modalites: GameReady: Right Knee Medium compression  10 minutes  36 degrees  [facility] Adult PT Treatment:                                                DATE: 09/03/2024 Therapeutic Exercise: Supine ankle pumps x 20 R Seated QS x 5 - 5 hold Seated heel slide x 5 - 5 hold R Seated hamstring stretch x 30 R Modalites: Ice to R knee in sitting  PATIENT EDUCATION:  Education details: eval findings, LEFS, HEP, POC Person educated: Patient Education method: Explanation, Demonstration, and Handouts Education comprehension: verbalized understanding and returned demonstration  HOME EXERCISE PROGRAM: Access Code: [email or web address] URL: [email or web address] Date: 09/14/2024 Prepared by: Alm Kingdom  Exercises - Supine Ankle Pumps  - 8 x daily - 7 x weekly - 2 sets - 20-30 reps - Long Sitting Quad Set  - 4-5 x daily - 7 x weekly - 3 sets - 10 reps - 5 sec hold - Seated Heel Slide  - 4-5 x daily - 7 x weekly - 2-3 sets - 10 reps - 5 sec hold - Seated Hamstring Stretch  - 4-5 x daily - 7 x weekly - 2-3 reps - 30 sec hold - Seated Knee Extension Stretch with Chair  - 3 x daily - 7 x weekly - 2 min hold  ASSESSMENT:  CLINICAL IMPRESSION: Pt was able to complete all prescribed exercises with no adverse effect. We focused on continued exercises for TKA rehab focused on knee flexion and extension ROM. Showed improved ROM for flexion. Continues to benefit from skilled PT services, will continue per POC. STGs met.   EVAL: Patient is a [age] y.o. F who was seen today s/p physical therapy evaluation and treatment for R TKA performed by Dr. Beverley on 08/31/2024. Physical findings are consistent with  surgery and recovery timeline as pt demonstrates decreased R knee ROM, strength, and functional mobility. Today we could not assess or treatment very much due to symptoms of dizziness and orthostasis, measured BP twice which was very low and had no symptom resolvement with gingerale and simple sugars. PT assisted pt back to car via w/c, she refused ED transport and notes that she needs to eat something. LEFS shows shows severe disability in performance of home ADLs and community activities. Pt would benefit from skilled PT services working on improving knee ROM, strength, and gait post TKA. Will assess vitals and symptoms of orthostasis next session.   OBJECTIVE IMPAIRMENTS: Abnormal gait, decreased activity tolerance, decreased balance, decreased endurance, decreased mobility, difficulty walking, decreased ROM, decreased strength, and pain.   ACTIVITY LIMITATIONS: carrying, lifting, bending, sitting, standing, squatting, stairs, transfers, bed mobility, and locomotion level  PARTICIPATION LIMITATIONS: meal prep, cleaning, driving, shopping, community activity, occupation, yard work, and school  PERSONAL FACTORS: Fitness and 3+ comorbidities: L THA, lumbar fusion, HTN are also affecting patient's functional outcome.   REHAB POTENTIAL: Good  CLINICAL DECISION MAKING: Evolving/moderate complexity  EVALUATION COMPLEXITY: Moderate   GOALS: Goals reviewed with patient? No  SHORT TERM GOALS: Target date: 09/24/2024   Pt will be compliant and knowledgeable with initial HEP for improved comfort  and carryover Baseline: initial HEP given  Goal status: MET   2.  Pt will self report right knee pain no greater than 8/10 for improved comfort and functional ability Baseline: 10/10 at worst Goal status: MET   LONG TERM GOALS: Target date: 11/26/2024   Pt will improve LEFS to no less than 30/80 as proxy for functional improvement with home ADLs and higher level community activity Baseline: 0/80 Goal  status: INITIAL   2.  Pt will self report right knee pain no greater than 6/10 for improved comfort and functional ability Baseline: 10/10 at worst Goal status: INITIAL   3.  Pt will increase 30 Second Sit to Stand rep count by no less than 2 reps for improved balance, strength, and functional mobility Baseline: deferred due to BP  Goal status: INITIAL   4.  Pt will improve R knee AROM to at least range of 3-115 degrees for improved comfort and functional mobility Baseline: 17-75 Goal status: INITIAL  5.  Pt will be able to walk recreationally with no increase in R knee pain for improved comfort and return to desired recreational activity Baseline:  Goal status: INITIAL   PLAN:  PT FREQUENCY: 2x/week  PT DURATION: 12 weeks  PLANNED INTERVENTIONS: 97164- PT Re-evaluation, 97110-Therapeutic exercises, 97530- Therapeutic activity, [id number]- Neuromuscular re-education, 97535- Self Care, [id number]- Manual therapy, [id number]- Gait training, [id number]- Electrical stimulation (unattended), [id number]- Electrical stimulation (manual), 97016- Vasopneumatic device, and Patient/Family education  PLAN FOR NEXT SESSION: assess BP, HEP, R knee ROM and gait, quad strength  For all possible CPT codes, reference the Planned Interventions line above.     Check all conditions that are expected to impact treatment: {Conditions expected to impact treatment:Musculoskeletal disorders   If treatment provided at initial evaluation, no treatment charged due to lack of authorization.       Harlene Persons, PTA 09/30/24 2:33 PM Phone: [phone] Fax: [phone]

## 2024-10-05 ENCOUNTER — Ambulatory Visit

## 2024-10-05 NOTE — Therapy (Unsigned)
 OUTPATIENT PHYSICAL THERAPY TREATMENT   Patient Name: Alicia Blake MRN: [id number] DOB:1966-02-07, 58 y.o., female Today's Date: 10/05/2024  END OF SESSION:        Past Medical History:  Diagnosis Date   Asthma    Bipolar affect, depressed (HCC)    Bronchitis    Chronic back pain    Closed left ankle fracture    COPD (chronic obstructive pulmonary disease) (HCC)    DJD (degenerative joint disease)    BACK   GERD (gastroesophageal reflux disease)    Hypertension    Pneumonia    Pre-diabetes    dr entered diabetes without complications- no meds does not test sugar at home   PTSD (post-traumatic stress disorder)    Scoliosis    Seizures (HCC)    when drinking last yrs ago   Shortness of breath dyspnea    exersion   Substance abuse (HCC)    crack cocaine, 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, none since   Thyroid disease    UTI (lower urinary tract infection)    Past Surgical History:  Procedure Laterality Date   2 LEFT TOES SURGERY  12/2014   finished only on right side   ABDOMINAL EXPOSURE N/A 03/26/2023   Procedure: ABDOMINAL EXPOSURE;  Surgeon: Gretta Lonni PARAS, MD;  Location: [facility] OR;  Service: Vascular;  Laterality: N/A;   ABDOMINAL EXPOSURE N/A 04/02/2023   Procedure: ABDOMINAL EXPOSURE;  Surgeon: Gretta Lonni PARAS, MD;  Location: [facility] OR;  Service: Vascular;  Laterality: N/A;   ANTERIOR LUMBAR FUSION N/A 03/26/2023   Procedure: REMOVAL OF POSTERIOR HARDWARE;  Surgeon: Beuford Anes, MD;  Location: [facility] OR;  Service: Orthopedics;  Laterality: N/A;   ANTERIOR LUMBAR FUSION N/A 03/26/2023   Procedure: LUMBAR FOUR - LUMBAR FIVE, LUMBAR FIVE - SACRUM ONE ANTERIOR LUMBAR INTERBODY FUSION WITH INSTRUMENTATION AND ALLOGRAFT;  Surgeon: Beuford Anes, MD;  Location: [facility] OR;  Service: Orthopedics;  Laterality: N/A;   ANTERIOR LUMBAR FUSION N/A 04/02/2023   Procedure: REVISION ANTERIOR LUMBAR FUSION L5-S1;  Surgeon: Beuford Anes, MD;  Location: [facility] OR;  Service: Orthopedics;   Laterality: N/A;   BACK SURGERY     BALLOON DILATION N/A 12/14/2015   Procedure: BALLOON DILATION;  Surgeon: Norleen Hint, MD;  Location: [facility];  Service: Endoscopy;  Laterality: N/A;   COLONOSCOPY WITH PROPOFOL  N/A 12/14/2015   Procedure: COLONOSCOPY WITH PROPOFOL ;  Surgeon: Norleen Hint, MD;  Location: [facility];  Service: Endoscopy;  Laterality: N/A;   ESOPHAGOGASTRODUODENOSCOPY (EGD) WITH PROPOFOL  N/A 12/14/2015   Procedure: ESOPHAGOGASTRODUODENOSCOPY (EGD) WITH PROPOFOL ;  Surgeon: Norleen Hint, MD;  Location: [facility];  Service: Endoscopy;  Laterality: N/A;   FRACTURE SURGERY     JOINT REPLACEMENT     left hip   LEFT ANKLE SURGERY  12/2014   LUMBAR LAMINECTOMY/DECOMPRESSION MICRODISCECTOMY N/A 06/18/2017   Procedure: LAMINECTOMY AND FORAMINOTOMY LUMBAR FOUR - LUMBAR FIVE;  Surgeon: Gillie Duncans, MD;  Location: [facility] OR;  Service: Neurosurgery;  Laterality: N/A;  LAMINECTOMY AND FORAMINOTOMY LUMBAR 4- LUMBAR 5   LUMBAR WOUND DEBRIDEMENT N/A 03/10/2020   Procedure: Lumbar Wound Debridement;  Surgeon: Gillie Duncans, MD;  Location: [facility] OR;  Service: Neurosurgery;  Laterality: N/A;  posterior   NECK SURGERY  AS TEENAGER   STITCHES TO NECK    ORIF ANKLE FRACTURE Left 07/24/2021   Procedure: OPEN REDUCTION INTERNAL FIXATION (ORIF) ANKLE FRACTURE;  Surgeon: Beverley Evalene BIRCH, MD;  Location: [facility];  Service: Orthopedics;  Laterality: Left;   SURGERY FOR CUT ON STOMACH  TEENAGER   TOTAL HIP ARTHROPLASTY Left 05/14/2016   Procedure: TOTAL HIP ARTHROPLASTY ANTERIOR APPROACH;  Surgeon: [name], MD;  Location: [facility] OR;  Service: Orthopedics;  Laterality: Left;   TOTAL KNEE ARTHROPLASTY Right 08/31/2024   Procedure: ARTHROPLASTY, KNEE, TOTAL;  Surgeon: Chancy Evalene JONETTA, MD;  Location: [facility] ORS;  Service: Orthopedics;  Laterality: Right;   Patient Active Problem List   Diagnosis Date Noted   S/P total knee arthroplasty, right 08/31/2024   Radiculopathy, lumbar region  03/26/2023   Acid reflux 11/07/2022   Lumbar foraminal stenosis 04/21/2020   Tongue lesion 03/04/2019   Macromastia 08/11/2018   Laryngopharyngeal reflux (LPR) 12/16/2017   Pharyngeal dysphagia 12/16/2017   Right ear impacted cerumen 12/16/2017   Lumbar stenosis with neurogenic claudication 06/18/2017   Bilateral sensorineural hearing loss 10/14/2016   Subjective tinnitus, bilateral 10/14/2016   Tobacco abuse 04/19/2016   Primary osteoarthritis of left hip 04/18/2016   Opioid use disorder, mild, abuse (HCC) 09/08/2015   Schizoaffective disorder, bipolar type (HCC) 09/07/2015   Chronic pain syndrome 09/07/2015   Alcohol use disorder, moderate, in sustained remission (HCC) 09/07/2015   Cocaine use disorder, moderate, in sustained remission (HCC) 09/07/2015   Cannabis use disorder, moderate, in sustained remission (HCC) 09/07/2015   DDD (degenerative disc disease), lumbar 09/07/2015   Hx of fracture of ankle 09/07/2015   Arthropathy of ankle and foot 03/04/2014   Hammertoe 03/04/2014   Cystocele, unspecified 09/03/2013   Symptomatic Fibroids 08/09/2013   Breast lump on left side at 9 o'clock position 06/29/2013   Breast discharge 06/29/2013   Closed disp fx of lateral condyle of left tibia with routine healing 11/12/2012   Asthma 08/13/2011   History of gastroesophageal reflux (GERD) 08/13/2011   Hyperglycemia 08/13/2011   Post-traumatic stress disorder, unspecified 08/13/2011    PCP: Shelda Atlas, MD  REFERRING PROVIDER: Chancy Evalene JONETTA, MD  REFERRING DIAG: [phone] (ICD-10-CM) - Presence of right artificial knee joint   THERAPY DIAG:  No diagnosis found.  Rationale for Evaluation and Treatment: Rehabilitation  ONSET DATE: 08/31/2024  SUBJECTIVE:   SUBJECTIVE STATEMENT: Pt presents to PT with reports of continued R knee pain and tightness but endorses improvement. Has been compliant with HEP with no adverse effect.   EVAL: Pt presents to PT s/p physical therapy  evaluation and treatment for R TKA performed by Dr. Chancy on 08/31/2024. On arrival and start of evaluation she notes that she is feeling very dizzy while walking back to gym. We checked her BP which was very low, she states that she has not eaten since noon the previous day. Has had a lot of pain in R knee post surgery, difficult getting around her house and her family is there to help her.   PERTINENT HISTORY: L THA, lumbar fusion, HTN  PAIN:  Are you having pain?  Yes: NPRS scale: 7/10 Worst: 7-8/10 Pain location: R anterior knee Pain description: sharp, sore Aggravating factors: transfers, standing, bending Relieving factors: medication   PRECAUTIONS: None  RED FLAGS: None   WEIGHT BEARING RESTRICTIONS: Yes WBAT  FALLS:  Has patient fallen in last 6 months? No  LIVING ENVIRONMENT: Lives with: lives alone Lives in: House/apartment Stairs: Yes: External: 3 steps; bilateral but cannot reach both Has following equipment at home: Single point cane and Walker - 2 wheeled  OCCUPATION: Not working   PLOF: Independent  PATIENT GOALS: improve knee motion, get back to walking recreationally, improve knee ROM  NEXT MD VISIT: 09/15/2024  OBJECTIVE:  Note:  Objective measures were completed at Evaluation unless otherwise noted.  DIAGNOSTIC FINDINGS: See imaging   PATIENT SURVEYS:  LEFS  Extreme difficulty/unable (0), Quite a bit of difficulty (1), Moderate difficulty (2), Little difficulty (3), No difficulty (4) Survey date:  09/03/2024  Any of your usual work, housework or school activities 0  2. Usual hobbies, recreational or sporting activities 0  3. Getting into/out of the bath 0  4. Walking between rooms 0  5. Putting on socks/shoes 0  6. Squatting  0  7. Lifting an object, like a bag of groceries from the floor 0  8. Performing light activities around your home 0  9. Performing heavy activities around your home 0  10. Getting into/out of a car 0  11. Walking 2  blocks 0  12. Walking 1 mile 0  13. Going up/down 10 stairs (1 flight) 0  14. Standing for 1 hour 0  15.  sitting for 1 hour 0  16. Running on even ground 0  17. Running on uneven ground 0  18. Making sharp turns while running fast 0  19. Hopping  0  20. Rolling over in bed 0  Score total:  0/80     COGNITION: Overall cognitive status: Within functional limits for tasks assessed     SENSATION: Light touch: Impaired - lateral R knee  POSTURE: rounded shoulders and forward head and R LE externally rotated   PALPATION: TTP to distal R quad  LOWER EXTREMITY ROM:  Active ROM Right eval Left eval Right 09/07/24 Right 09/09/24 Right 09/14/24 09/16/24 09/22/24 09/30/24  Knee flexion 75 120 88 AAROM 92 AAROM 90 AAROM 98 AA 106 110  Knee extension 17 0 17 15 15 9      (Blank rows = not tested)  LOWER EXTREMITY MMT:  MMT Right eval Left eval  Hip flexion    Hip extension    Hip abduction    Hip adduction    Hip internal rotation    Hip external rotation    Knee flexion DNT   Knee extension DNT   Ankle dorsiflexion    Ankle plantarflexion    Ankle inversion    Ankle eversion     (Blank rows = not tested)  FUNCTIONAL TESTS:  30 Second Sit to Stand: 6 reps with UE  TREATMENT: [facility] Adult PT Treatment:                                                DATE: 09/30/24  Nustep L5 x 6 minutes Ue/LE Step up 6 inch 10 x 2 1 UE support  Seated LAQ 3# 10 x 2 right  Seated h/s curl red band 10 x 2 right  Supine SAQ 4# 10 x 2  SLR 10 x 2 e Heel slide AAROM  H.s stretch with strap   Modalities: Declined     [facility] Adult PT Treatment:                                                DATE: 09/22/2024 BP: 124/74 NuStep lvl 5 UE/LE x 4 min working on knee ROM LAQ 3x10 3# R STS 2x10 BUE support R foot back Supine QS x 10 - 3 hold Supine  SLR 2x10 R Supine heel slide with strap 2x10 - 5 hold R  Supine hamstring stretch with strap 3x30 R SAQ 2x15 R Step knee flex stretch x  10 - 5 hold R Step up 2x10 R stance B UE 6in in // Standing hip abd 2x10 ea Modalites: GameReady: Right Knee Medium compression  5 minutes  36 degrees  [facility] Adult PT Treatment:                                                DATE: 09/16/2024 BP: 124/74 NuStep lvl 5 UE/LE x 4 min working on knee ROM STS 3x5 BUE support R foot back Seated heel slide 2x10 - 5 hold LAQ 3x10 R 2# Supine QS x 10 - 3 hold Supine SLR 2x10 R Supine heel slide with strap 2x10 - 5 hold R  Supine hamstring stretch with strap 2x30 R SAQ 2x15 R Step knee flex stretch x 10 - 5 hold R Step up x 10 R stance B UE 6in in // Modalites: GameReady: Right Knee Medium compression  5 minutes  36 degrees  [facility] Adult PT Treatment:                                                DATE: 09/14/2024 BP: 124/74 NuStep lvl 5 UE/LE x 4 min working on knee ROM STS 2x5 BUE support R foot back Seated hamstring stretch 2x30 R LAQ 2x10 R Supine QS 2x10 - 3 hold Supine heel slide with strap 2x10 - 5 hold R Modalites: GameReady: Right Knee Medium compression  10 minutes  36 degrees  [facility] Adult PT Treatment:                                                DATE: 09/09/2024 BP: 124/74 NuStep lvl 4 UE/LE x 4 min working on knee ROM Seated hamstring stretch 2x30 R LAQ 2x10 R STS 3x5 BUE support  Supine heel slide with strap 2x10 - 5 hold R Supine QS 2x10 - 5 hold SAQ 2x10 R Modalites: GameReady: Right Knee Medium compression  10 minutes  36 degrees  [facility] Adult PT Treatment:                                                DATE: 09/07/2024 BP: 99/54 Supine QS 2x10 - 5 hold Supine heel slide with strap x 10 - 5 hold R Seated hamstring stretch 2x30 R Seated heel slide 2x10 - 5 hold NuStep lvl 3 UE/LE x 4 min working on knee ROM Modalites: GameReady: Right Knee Medium compression  10 minutes  36 degrees  [facility] Adult PT Treatment:                                                DATE:  09/03/2024 Therapeutic Exercise: Supine ankle  pumps x 20 R Seated QS x 5 - 5 hold Seated heel slide x 5 - 5 hold R Seated hamstring stretch x 30 R Modalites: Ice to R knee in sitting  PATIENT EDUCATION:  Education details: eval findings, LEFS, HEP, POC Person educated: Patient Education method: Explanation, Demonstration, and Handouts Education comprehension: verbalized understanding and returned demonstration  HOME EXERCISE PROGRAM: Access Code: [email or web address] URL: [email or web address] Date: 09/14/2024 Prepared by: Alm Kingdom  Exercises - Supine Ankle Pumps  - 8 x daily - 7 x weekly - 2 sets - 20-30 reps - Long Sitting Quad Set  - 4-5 x daily - 7 x weekly - 3 sets - 10 reps - 5 sec hold - Seated Heel Slide  - 4-5 x daily - 7 x weekly - 2-3 sets - 10 reps - 5 sec hold - Seated Hamstring Stretch  - 4-5 x daily - 7 x weekly - 2-3 reps - 30 sec hold - Seated Knee Extension Stretch with Chair  - 3 x daily - 7 x weekly - 2 min hold  ASSESSMENT:  CLINICAL IMPRESSION: Pt was able to complete all prescribed exercises with no adverse effect. We focused on continued exercises for TKA rehab focused on knee flexion and extension ROM. Showed improved ROM for flexion. Continues to benefit from skilled PT services, will continue per POC. STGs met.   EVAL: Patient is a 58 y.o. F who was seen today s/p physical therapy evaluation and treatment for R TKA performed by Dr. Beverley on 08/31/2024. Physical findings are consistent with surgery and recovery timeline as pt demonstrates decreased R knee ROM, strength, and functional mobility. Today we could not assess or treatment very much due to symptoms of dizziness and orthostasis, measured BP twice which was very low and had no symptom resolvement with gingerale and simple sugars. PT assisted pt back to car via w/c, she refused ED transport and notes that she needs to eat something. LEFS shows shows severe disability in performance of home  ADLs and community activities. Pt would benefit from skilled PT services working on improving knee ROM, strength, and gait post TKA. Will assess vitals and symptoms of orthostasis next session.   OBJECTIVE IMPAIRMENTS: Abnormal gait, decreased activity tolerance, decreased balance, decreased endurance, decreased mobility, difficulty walking, decreased ROM, decreased strength, and pain.   ACTIVITY LIMITATIONS: carrying, lifting, bending, sitting, standing, squatting, stairs, transfers, bed mobility, and locomotion level  PARTICIPATION LIMITATIONS: meal prep, cleaning, driving, shopping, community activity, occupation, yard work, and school  PERSONAL FACTORS: Fitness and 3+ comorbidities: L THA, lumbar fusion, HTN are also affecting patient's functional outcome.   REHAB POTENTIAL: Good  CLINICAL DECISION MAKING: Evolving/moderate complexity  EVALUATION COMPLEXITY: Moderate   GOALS: Goals reviewed with patient? No  SHORT TERM GOALS: Target date: 09/24/2024   Pt will be compliant and knowledgeable with initial HEP for improved comfort and carryover Baseline: initial HEP given  Goal status: MET   2.  Pt will self report right knee pain no greater than 8/10 for improved comfort and functional ability Baseline: 10/10 at worst Goal status: MET   LONG TERM GOALS: Target date: 11/26/2024   Pt will improve LEFS to no less than 30/80 as proxy for functional improvement with home ADLs and higher level community activity Baseline: 0/80 Goal status: INITIAL   2.  Pt will self report right knee pain no greater than 6/10 for improved comfort and functional ability Baseline: 10/10 at worst Goal status: INITIAL   3.  Pt will  increase 30 Second Sit to Stand rep count by no less than 2 reps for improved balance, strength, and functional mobility Baseline: deferred due to BP  Goal status: INITIAL   4.  Pt will improve R knee AROM to at least range of 3-115 degrees for improved comfort and  functional mobility Baseline: 17-75 Goal status: INITIAL  5.  Pt will be able to walk recreationally with no increase in R knee pain for improved comfort and return to desired recreational activity Baseline:  Goal status: INITIAL   PLAN:  PT FREQUENCY: 2x/week  PT DURATION: 12 weeks  PLANNED INTERVENTIONS: 97164- PT Re-evaluation, 97110-Therapeutic exercises, 97530- Therapeutic activity, [id number]- Neuromuscular re-education, 97535- Self Care, [id number]- Manual therapy, [id number]- Gait training, [id number]- Electrical stimulation (unattended), [id number]- Electrical stimulation (manual), 97016- Vasopneumatic device, and Patient/Family education  PLAN FOR NEXT SESSION: assess BP, HEP, R knee ROM and gait, quad strength  For all possible CPT codes, reference the Planned Interventions line above.     Check all conditions that are expected to impact treatment: {Conditions expected to impact treatment:Musculoskeletal disorders   If treatment provided at initial evaluation, no treatment charged due to lack of authorization.       Harlene Persons, PTA 10/05/24 2:44 PM Phone: [phone] Fax: [phone]

## 2024-10-06 ENCOUNTER — Ambulatory Visit

## 2024-10-06 DIAGNOSIS — M25561 Pain in right knee: Secondary | ICD-10-CM

## 2024-10-06 DIAGNOSIS — M6281 Muscle weakness (generalized): Secondary | ICD-10-CM

## 2024-10-06 DIAGNOSIS — R2689 Other abnormalities of gait and mobility: Secondary | ICD-10-CM

## 2024-10-08 ENCOUNTER — Ambulatory Visit: Admitting: Physical Therapy

## 2024-10-11 NOTE — Therapy (Unsigned)
 OUTPATIENT PHYSICAL THERAPY TREATMENT   Patient Name: Alicia Blake MRN: [id number] DOB:12-23-1965, 58 y.o., female Today's Date: 10/12/2024  END OF SESSION:  PT End of Session - 10/12/24 1221     Visit Number 9    Number of Visits 24    Date for Recertification  11/26/24    Authorization Type [employer]    Authorization Time Period Approved 10 PT visits from 09/03/24-12/01/24    Authorization - Visit Number 9    Authorization - Number of Visits 10    PT Start Time 1220    PT Stop Time 1300    PT Time Calculation (min) 40 min    Activity Tolerance Patient limited by pain;Patient limited by lethargy    Behavior During Therapy [facility] for tasks assessed/performed                Past Medical History:  Diagnosis Date   Asthma    Bipolar affect, depressed (HCC)    Bronchitis    Chronic back pain    Closed left ankle fracture    COPD (chronic obstructive pulmonary disease) (HCC)    DJD (degenerative joint disease)    BACK   GERD (gastroesophageal reflux disease)    Hypertension    Pneumonia    Pre-diabetes    dr entered diabetes without complications- no meds does not test sugar at home   PTSD (post-traumatic stress disorder)    Scoliosis    Seizures (HCC)    when drinking last yrs ago   Shortness of breath dyspnea    exersion   Substance abuse (HCC)    crack cocaine, 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, none since   Thyroid disease    UTI (lower urinary tract infection)    Past Surgical History:  Procedure Laterality Date   2 LEFT TOES SURGERY  12/2014   finished only on right side   ABDOMINAL EXPOSURE N/A 03/26/2023   Procedure: ABDOMINAL EXPOSURE;  Surgeon: Gretta Lonni PARAS, MD;  Location: [facility] OR;  Service: Vascular;  Laterality: N/A;   ABDOMINAL EXPOSURE N/A 04/02/2023   Procedure: ABDOMINAL EXPOSURE;  Surgeon: Gretta Lonni PARAS, MD;  Location: [facility] OR;  Service: Vascular;  Laterality: N/A;   ANTERIOR LUMBAR FUSION N/A 03/26/2023   Procedure: REMOVAL OF  POSTERIOR HARDWARE;  Surgeon: Beuford Anes, MD;  Location: [facility] OR;  Service: Orthopedics;  Laterality: N/A;   ANTERIOR LUMBAR FUSION N/A 03/26/2023   Procedure: LUMBAR FOUR - LUMBAR FIVE, LUMBAR FIVE - SACRUM ONE ANTERIOR LUMBAR INTERBODY FUSION WITH INSTRUMENTATION AND ALLOGRAFT;  Surgeon: Beuford Anes, MD;  Location: [facility] OR;  Service: Orthopedics;  Laterality: N/A;   ANTERIOR LUMBAR FUSION N/A 04/02/2023   Procedure: REVISION ANTERIOR LUMBAR FUSION L5-S1;  Surgeon: Beuford Anes, MD;  Location: [facility] OR;  Service: Orthopedics;  Laterality: N/A;   BACK SURGERY     BALLOON DILATION N/A 12/14/2015   Procedure: BALLOON DILATION;  Surgeon: Norleen Hint, MD;  Location: [facility]COPY;  Service: Endoscopy;  Laterality: N/A;   COLONOSCOPY WITH PROPOFOL  N/A 12/14/2015   Procedure: COLONOSCOPY WITH PROPOFOL ;  Surgeon: Norleen Hint, MD;  Location: [facility];  Service: Endoscopy;  Laterality: N/A;   ESOPHAGOGASTRODUODENOSCOPY (EGD) WITH PROPOFOL  N/A 12/14/2015   Procedure: ESOPHAGOGASTRODUODENOSCOPY (EGD) WITH PROPOFOL ;  Surgeon: Norleen Hint, MD;  Location: [facility];  Service: Endoscopy;  Laterality: N/A;   FRACTURE SURGERY     JOINT REPLACEMENT     left hip   LEFT ANKLE SURGERY  12/2014   LUMBAR LAMINECTOMY/DECOMPRESSION MICRODISCECTOMY N/A 06/18/2017  Procedure: LAMINECTOMY AND FORAMINOTOMY LUMBAR FOUR - LUMBAR FIVE;  Surgeon: Gillie Duncans, MD;  Location: [facility] OR;  Service: Neurosurgery;  Laterality: N/A;  LAMINECTOMY AND FORAMINOTOMY LUMBAR 4- LUMBAR 5   LUMBAR WOUND DEBRIDEMENT N/A 03/10/2020   Procedure: Lumbar Wound Debridement;  Surgeon: Gillie Duncans, MD;  Location: [facility] OR;  Service: Neurosurgery;  Laterality: N/A;  posterior   NECK SURGERY  AS TEENAGER   STITCHES TO NECK    ORIF ANKLE FRACTURE Left 07/24/2021   Procedure: OPEN REDUCTION INTERNAL FIXATION (ORIF) ANKLE FRACTURE;  Surgeon: Beverley Evalene BIRCH, MD;  Location: [facility];  Service: Orthopedics;  Laterality: Left;   SURGERY  FOR CUT ON STOMACH  TEENAGER   TOTAL HIP ARTHROPLASTY Left 05/14/2016   Procedure: TOTAL HIP ARTHROPLASTY ANTERIOR APPROACH;  Surgeon: Evalene BIRCH Beverley, MD;  Location: [facility] OR;  Service: Orthopedics;  Laterality: Left;   TOTAL KNEE ARTHROPLASTY Right 08/31/2024   Procedure: ARTHROPLASTY, KNEE, TOTAL;  Surgeon: Beverley Evalene BIRCH, MD;  Location: [facility] ORS;  Service: Orthopedics;  Laterality: Right;   Patient Active Problem List   Diagnosis Date Noted   S/P total knee arthroplasty, right 08/31/2024   Radiculopathy, lumbar region 03/26/2023   Acid reflux 11/07/2022   Lumbar foraminal stenosis 04/21/2020   Tongue lesion 03/04/2019   Macromastia 08/11/2018   Laryngopharyngeal reflux (LPR) 12/16/2017   Pharyngeal dysphagia 12/16/2017   Right ear impacted cerumen 12/16/2017   Lumbar stenosis with neurogenic claudication 06/18/2017   Bilateral sensorineural hearing loss 10/14/2016   Subjective tinnitus, bilateral 10/14/2016   Tobacco abuse 04/19/2016   Primary osteoarthritis of left hip 04/18/2016   Opioid use disorder, mild, abuse (HCC) 09/08/2015   Schizoaffective disorder, bipolar type (HCC) 09/07/2015   Chronic pain syndrome 09/07/2015   Alcohol use disorder, moderate, in sustained remission (HCC) 09/07/2015   Cocaine use disorder, moderate, in sustained remission (HCC) 09/07/2015   Cannabis use disorder, moderate, in sustained remission (HCC) 09/07/2015   DDD (degenerative disc disease), lumbar 09/07/2015   Hx of fracture of ankle 09/07/2015   Arthropathy of ankle and foot 03/04/2014   Hammertoe 03/04/2014   Cystocele, unspecified 09/03/2013   Symptomatic Fibroids 08/09/2013   Breast lump on left side at 9 o'clock position 06/29/2013   Breast discharge 06/29/2013   Closed disp fx of lateral condyle of left tibia with routine healing 11/12/2012   Asthma 08/13/2011   History of gastroesophageal reflux (GERD) 08/13/2011   Hyperglycemia 08/13/2011   Post-traumatic stress disorder,  unspecified 08/13/2011    PCP: Shelda Atlas, MD  REFERRING PROVIDER: Beverley Evalene BIRCH, MD  REFERRING DIAG: [id number] (ICD-10-CM) - Presence of right artificial knee joint   THERAPY DIAG:  Acute pain of right knee  Muscle weakness (generalized)  Other abnormalities of gait and mobility  Rationale for Evaluation and Treatment: Rehabilitation  ONSET DATE: 08/31/2024  SUBJECTIVE:   SUBJECTIVE STATEMENT:  Has obtained a SPC and has been using it for mobility w/o incident.  EVAL: Pt presents to PT s/p physical therapy evaluation and treatment for R TKA performed by Dr. Beverley on 08/31/2024. On arrival and start of evaluation she notes that she is feeling very dizzy while walking back to gym. We checked her BP which was very low, she states that she has not eaten since noon the previous day. Has had a lot of pain in R knee post surgery, difficult getting around her house and her family is there to help her.   PERTINENT HISTORY: L THA, lumbar fusion, HTN  PAIN:  Are you having pain?  Yes: NPRS scale: 7/10 Worst: 7-8/10 Pain location: R anterior knee Pain description: sharp, sore Aggravating factors: transfers, standing, bending Relieving factors: medication   PRECAUTIONS: None  RED FLAGS: None   WEIGHT BEARING RESTRICTIONS: Yes WBAT  FALLS:  Has patient fallen in last 6 months? No  LIVING ENVIRONMENT: Lives with: lives alone Lives in: House/apartment Stairs: Yes: External: 3 steps; bilateral but cannot reach both Has following equipment at home: Single point cane and Walker - 2 wheeled  OCCUPATION: Not working   PLOF: Independent  PATIENT GOALS: improve knee motion, get back to walking recreationally, improve knee ROM  NEXT MD VISIT: 09/15/2024  OBJECTIVE:  Note: Objective measures were completed at Evaluation unless otherwise noted.  DIAGNOSTIC FINDINGS: See imaging   PATIENT SURVEYS:  LEFS  Extreme difficulty/unable (0), Quite a bit of difficulty (1),  Moderate difficulty (2), Little difficulty (3), No difficulty (4) Survey date:  09/03/2024  Any of your usual work, housework or school activities 0  2. Usual hobbies, recreational or sporting activities 0  3. Getting into/out of the bath 0  4. Walking between rooms 0  5. Putting on socks/shoes 0  6. Squatting  0  7. Lifting an object, like a bag of groceries from the floor 0  8. Performing light activities around your home 0  9. Performing heavy activities around your home 0  10. Getting into/out of a car 0  11. Walking 2 blocks 0  12. Walking 1 mile 0  13. Going up/down 10 stairs (1 flight) 0  14. Standing for 1 hour 0  15.  sitting for 1 hour 0  16. Running on even ground 0  17. Running on uneven ground 0  18. Making sharp turns while running fast 0  19. Hopping  0  20. Rolling over in bed 0  Score total:  0/80     COGNITION: Overall cognitive status: Within functional limits for tasks assessed     SENSATION: Light touch: Impaired - lateral R knee  POSTURE: rounded shoulders and forward head and R LE externally rotated   PALPATION: TTP to distal R quad  LOWER EXTREMITY ROM:  Active ROM Right eval Left eval Right 09/07/24 Right 09/09/24 Right 09/14/24 09/16/24 09/22/24 09/30/24  Knee flexion 75 120 88 AAROM 92 AAROM 90 AAROM 98 AA 106 110  Knee extension 17 0 17 15 15 9      (Blank rows = not tested)  LOWER EXTREMITY MMT:  MMT Right eval Left eval  Hip flexion    Hip extension    Hip abduction    Hip adduction    Hip internal rotation    Hip external rotation    Knee flexion DNT   Knee extension DNT   Ankle dorsiflexion    Ankle plantarflexion    Ankle inversion    Ankle eversion     (Blank rows = not tested)  FUNCTIONAL TESTS:  30 Second Sit to Stand: 6 reps with UE  TREATMENT: [facility] Adult PT Treatment:                                                DATE: 10/12/24 Therapeutic Exercise: Nustep L4 8 min R FAQs 5# 2x15 Seated ham curls BluTB  15x2 Supine hip fallouts BluTB 15x B, 15/15  Bridge against BluTB 15x  Neuromuscular re-ed:  Heel raise from 4 in step Runners step 4 in 15/15 Therapeutic Activity: Seated hamstring stretch R 30s x2 SAQs 5# 3s 15x2 Heel slides with strap 15x 122d flexion 16 steps with cane and rail, step to pattern   [facility] Adult PT Treatment:                                                DATE: 10/06/24 Therapeutic Exercise: Nustep L4 8 min R FAQs 3# 2x15 Seated ham curls RTB 15x2 Supine hip fallouts GTB 15x B, 15/15  Neuromuscular re-ed: Heel raise from 4 in step Runners step 4 in 15/15 Therapeutic Activity: Seated hamstring stretch R 30s x2 SAQs 5# 3s 15x2 Heel slides with strap 15x 112d flexion  [facility] Adult PT Treatment:                                                DATE: 09/30/24  Nustep L5 x 6 minutes Ue/LE Step up 6 inch 10 x 2 1 UE support  Seated LAQ 3# 10 x 2 right  Seated h/s curl red band 10 x 2 right  Supine SAQ 4# 10 x 2  SLR 10 x 2 e Heel slide AAROM  H.s stretch with strap   Modalities: Declined     [facility] Adult PT Treatment:                                                DATE: 09/22/2024 BP: 124/74 NuStep lvl 5 UE/LE x 4 min working on knee ROM LAQ 3x10 3# R STS 2x10 BUE support R foot back Supine QS x 10 - 3 hold Supine SLR 2x10 R Supine heel slide with strap 2x10 - 5 hold R  Supine hamstring stretch with strap 3x30 R SAQ 2x15 R Step knee flex stretch x 10 - 5 hold R Step up 2x10 R stance B UE 6in in // Standing hip abd 2x10 ea Modalites: GameReady: Right Knee Medium compression  5 minutes  36 degrees  [facility] Adult PT Treatment:                                                DATE: 09/16/2024 BP: 124/74 NuStep lvl 5 UE/LE x 4 min working on knee ROM STS 3x5 BUE support R foot back Seated heel slide 2x10 - 5 hold LAQ 3x10 R 2# Supine QS x 10 - 3 hold Supine SLR 2x10 R Supine heel slide with strap 2x10 - 5 hold R  Supine hamstring stretch with  strap 2x30 R SAQ 2x15 R Step knee flex stretch x 10 - 5 hold R Step up x 10 R stance B UE 6in in // Modalites: GameReady: Right Knee Medium compression  5 minutes  36 degrees  [facility] Adult PT Treatment:  DATE: 09/14/2024 BP: 124/74 NuStep lvl 5 UE/LE x 4 min working on knee ROM STS 2x5 BUE support R foot back Seated hamstring stretch 2x30 R LAQ 2x10 R Supine QS 2x10 - 3 hold Supine heel slide with strap 2x10 - 5 hold R Modalites: GameReady: Right Knee Medium compression  10 minutes  36 degrees  [facility] Adult PT Treatment:                                                DATE: 09/09/2024 BP: 124/74 NuStep lvl 4 UE/LE x 4 min working on knee ROM Seated hamstring stretch 2x30 R LAQ 2x10 R STS 3x5 BUE support  Supine heel slide with strap 2x10 - 5 hold R Supine QS 2x10 - 5 hold SAQ 2x10 R Modalites: GameReady: Right Knee Medium compression  10 minutes  36 degrees  [facility] Adult PT Treatment:                                                DATE: 09/07/2024 BP: 99/54 Supine QS 2x10 - 5 hold Supine heel slide with strap x 10 - 5 hold R Seated hamstring stretch 2x30 R Seated heel slide 2x10 - 5 hold NuStep lvl 3 UE/LE x 4 min working on knee ROM Modalites: GameReady: Right Knee Medium compression  10 minutes  36 degrees  [facility] Adult PT Treatment:                                                DATE: 09/03/2024 Therapeutic Exercise: Supine ankle pumps x 20 R Seated QS x 5 - 5 hold Seated heel slide x 5 - 5 hold R Seated hamstring stretch x 30 R Modalites: Ice to R knee in sitting  PATIENT EDUCATION:  Education details: eval findings, LEFS, HEP, POC Person educated: Patient Education method: Explanation, Demonstration, and Handouts Education comprehension: verbalized understanding and returned demonstration  HOME EXERCISE PROGRAM: Access Code: [email or web address] URL: [email or web address] Date:  09/14/2024 Prepared by: Alm Kingdom  Exercises - Supine Ankle Pumps  - 8 x daily - 7 x weekly - 2 sets - 20-30 reps - Long Sitting Quad Set  - 4-5 x daily - 7 x weekly - 3 sets - 10 reps - 5 sec hold - Seated Heel Slide  - 4-5 x daily - 7 x weekly - 2-3 sets - 10 reps - 5 sec hold - Seated Hamstring Stretch  - 4-5 x daily - 7 x weekly - 2-3 reps - 30 sec hold - Seated Knee Extension Stretch with Chair  - 3 x daily - 7 x weekly - 2 min hold  ASSESSMENT: Excellent gains in flexion and function.  Assessed gait with cane switching to opposite hand as well as stair climbing ability.  Able to advance with resistance w/o setback.   EVAL: Patient is a 57 y.o. F who was seen today s/p physical therapy evaluation and treatment for R TKA performed by Dr. Beverley on 08/31/2024. Physical findings are consistent with surgery and recovery timeline as pt demonstrates decreased R knee ROM, strength, and functional mobility. Today we  could not assess or treatment very much due to symptoms of dizziness and orthostasis, measured BP twice which was very low and had no symptom resolvement with gingerale and simple sugars. PT assisted pt back to car via w/c, she refused ED transport and notes that she needs to eat something. LEFS shows shows severe disability in performance of home ADLs and community activities. Pt would benefit from skilled PT services working on improving knee ROM, strength, and gait post TKA. Will assess vitals and symptoms of orthostasis next session.   OBJECTIVE IMPAIRMENTS: Abnormal gait, decreased activity tolerance, decreased balance, decreased endurance, decreased mobility, difficulty walking, decreased ROM, decreased strength, and pain.   ACTIVITY LIMITATIONS: carrying, lifting, bending, sitting, standing, squatting, stairs, transfers, bed mobility, and locomotion level  PARTICIPATION LIMITATIONS: meal prep, cleaning, driving, shopping, community activity, occupation, yard work, and  school  PERSONAL FACTORS: Fitness and 3+ comorbidities: L THA, lumbar fusion, HTN are also affecting patient's functional outcome.   REHAB POTENTIAL: Good  CLINICAL DECISION MAKING: Evolving/moderate complexity  EVALUATION COMPLEXITY: Moderate   GOALS: Goals reviewed with patient? No  SHORT TERM GOALS: Target date: 09/24/2024   Pt will be compliant and knowledgeable with initial HEP for improved comfort and carryover Baseline: initial HEP given  Goal status: MET   2.  Pt will self report right knee pain no greater than 8/10 for improved comfort and functional ability Baseline: 10/10 at worst Goal status: MET   LONG TERM GOALS: Target date: 11/26/2024   Pt will improve LEFS to no less than 30/80 as proxy for functional improvement with home ADLs and higher level community activity Baseline: 0/80 Goal status: INITIAL   2.  Pt will self report right knee pain no greater than 6/10 for improved comfort and functional ability Baseline: 10/10 at worst Goal status: INITIAL   3.  Pt will increase 30 Second Sit to Stand rep count by no less than 2 reps for improved balance, strength, and functional mobility Baseline: deferred due to BP  Goal status: INITIAL   4.  Pt will improve R knee AROM to at least range of 3-115 degrees for improved comfort and functional mobility Baseline: 17-75 Goal status: INITIAL  5.  Pt will be able to walk recreationally with no increase in R knee pain for improved comfort and return to desired recreational activity Baseline:  Goal status: INITIAL   PLAN:  PT FREQUENCY: 2x/week  PT DURATION: 12 weeks  PLANNED INTERVENTIONS: 97164- PT Re-evaluation, 97110-Therapeutic exercises, 97530- Therapeutic activity, [id number]- Neuromuscular re-education, 97535- Self Care, [id number]- Manual therapy, [id number]- Gait training, [id number]- Electrical stimulation (unattended), [id number]- Electrical stimulation (manual), 97016- Vasopneumatic device, and Patient/Family  education  PLAN FOR NEXT SESSION: assess BP, HEP, R knee ROM and gait, quad strength  For all possible CPT codes, reference the Planned Interventions line above.     Check all conditions that are expected to impact treatment: {Conditions expected to impact treatment:Musculoskeletal disorders   If treatment provided at initial evaluation, no treatment charged due to lack of authorization.       Harlene Persons, PTA 10/12/2024 1:25 PM Phone: [phone] Fax: [phone]

## 2024-10-12 ENCOUNTER — Ambulatory Visit

## 2024-10-12 DIAGNOSIS — M6281 Muscle weakness (generalized): Secondary | ICD-10-CM

## 2024-10-12 DIAGNOSIS — M25561 Pain in right knee: Secondary | ICD-10-CM

## 2024-10-12 DIAGNOSIS — R2689 Other abnormalities of gait and mobility: Secondary | ICD-10-CM

## 2024-10-15 ENCOUNTER — Ambulatory Visit: Admitting: Physical Therapy

## 2024-10-15 DIAGNOSIS — M6281 Muscle weakness (generalized): Secondary | ICD-10-CM

## 2024-10-15 DIAGNOSIS — M25561 Pain in right knee: Secondary | ICD-10-CM | POA: Diagnosis not present

## 2024-10-15 NOTE — Therapy (Addendum)
 " OUTPATIENT PHYSICAL THERAPY TREATMENT/DISCHARGE   Patient Name: Alicia Blake MRN: [id number] DOB:1966/08/23, 58 y.o., female Today's Date: 10/15/2024 PHYSICAL THERAPY DISCHARGE SUMMARY  Visits from Start of Care: 10  Current functional level related to goals / functional outcomes: Goals partially met   Remaining deficits: Strength   Education / Equipment: HEP   Patient agrees to discharge. Patient goals were partially met. Patient is being discharged due to being pleased with the current functional level.  END OF SESSION:  PT End of Session - 10/15/24 1100     Visit Number 10    Number of Visits 24    Date for Recertification  11/26/24    Authorization Type [employer]    Authorization Time Period Approved 10 PT visits from 09/03/24-12/01/24    Authorization - Visit Number 10    Authorization - Number of Visits 10    PT Start Time 1100    PT Stop Time 1145    PT Time Calculation (min) 45 min                 Past Medical History:  Diagnosis Date   Asthma    Bipolar affect, depressed (HCC)    Bronchitis    Chronic back pain    Closed left ankle fracture    COPD (chronic obstructive pulmonary disease) (HCC)    DJD (degenerative joint disease)    BACK   GERD (gastroesophageal reflux disease)    Hypertension    Pneumonia    Pre-diabetes    dr entered diabetes without complications- no meds does not test sugar at home   PTSD (post-traumatic stress disorder)    Scoliosis    Seizures (HCC)    when drinking last yrs ago   Shortness of breath dyspnea    exersion   Substance abuse (HCC)    crack cocaine, 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, none since   Thyroid disease    UTI (lower urinary tract infection)    Past Surgical History:  Procedure Laterality Date   2 LEFT TOES SURGERY  12/2014   finished only on right side   ABDOMINAL EXPOSURE N/A 03/26/2023   Procedure: ABDOMINAL EXPOSURE;  Surgeon: Gretta Lonni PARAS, MD;  Location: [facility] OR;  Service:  Vascular;  Laterality: N/A;   ABDOMINAL EXPOSURE N/A 04/02/2023   Procedure: ABDOMINAL EXPOSURE;  Surgeon: Gretta Lonni PARAS, MD;  Location: [facility] OR;  Service: Vascular;  Laterality: N/A;   ANTERIOR LUMBAR FUSION N/A 03/26/2023   Procedure: REMOVAL OF POSTERIOR HARDWARE;  Surgeon: Beuford Anes, MD;  Location: [facility] OR;  Service: Orthopedics;  Laterality: N/A;   ANTERIOR LUMBAR FUSION N/A 03/26/2023   Procedure: LUMBAR FOUR - LUMBAR FIVE, LUMBAR FIVE - SACRUM ONE ANTERIOR LUMBAR INTERBODY FUSION WITH INSTRUMENTATION AND ALLOGRAFT;  Surgeon: Beuford Anes, MD;  Location: [facility] OR;  Service: Orthopedics;  Laterality: N/A;   ANTERIOR LUMBAR FUSION N/A 04/02/2023   Procedure: REVISION ANTERIOR LUMBAR FUSION L5-S1;  Surgeon: Beuford Anes, MD;  Location: [facility] OR;  Service: Orthopedics;  Laterality: N/A;   BACK SURGERY     BALLOON DILATION N/A 12/14/2015   Procedure: BALLOON DILATION;  Surgeon: Norleen Hint, MD;  Location: [facility];  Service: Endoscopy;  Laterality: N/A;   COLONOSCOPY WITH PROPOFOL  N/A 12/14/2015   Procedure: COLONOSCOPY WITH PROPOFOL ;  Surgeon: Norleen Hint, MD;  Location: [facility];  Service: Endoscopy;  Laterality: N/A;   ESOPHAGOGASTRODUODENOSCOPY (EGD) WITH PROPOFOL  N/A 12/14/2015   Procedure: ESOPHAGOGASTRODUODENOSCOPY (EGD) WITH PROPOFOL ;  Surgeon: Norleen Hint, MD;  Location: [facility];  Service: Endoscopy;  Laterality: N/A;   FRACTURE SURGERY     JOINT REPLACEMENT     left hip   LEFT ANKLE SURGERY  12/2014   LUMBAR LAMINECTOMY/DECOMPRESSION MICRODISCECTOMY N/A 06/18/2017   Procedure: LAMINECTOMY AND FORAMINOTOMY LUMBAR FOUR - LUMBAR FIVE;  Surgeon: Gillie Duncans, MD;  Location: [facility] OR;  Service: Neurosurgery;  Laterality: N/A;  LAMINECTOMY AND FORAMINOTOMY LUMBAR 4- LUMBAR 5   LUMBAR WOUND DEBRIDEMENT N/A 03/10/2020   Procedure: Lumbar Wound Debridement;  Surgeon: Gillie Duncans, MD;  Location: [facility] OR;  Service: Neurosurgery;  Laterality: N/A;  posterior   NECK SURGERY  AS TEENAGER    STITCHES TO NECK    ORIF ANKLE FRACTURE Left 07/24/2021   Procedure: OPEN REDUCTION INTERNAL FIXATION (ORIF) ANKLE FRACTURE;  Surgeon: Beverley Evalene BIRCH, MD;  Location: [facility];  Service: Orthopedics;  Laterality: Left;   SURGERY FOR CUT ON STOMACH  TEENAGER   TOTAL HIP ARTHROPLASTY Left 05/14/2016   Procedure: TOTAL HIP ARTHROPLASTY ANTERIOR APPROACH;  Surgeon: Evalene BIRCH Beverley, MD;  Location: [facility] OR;  Service: Orthopedics;  Laterality: Left;   TOTAL KNEE ARTHROPLASTY Right 08/31/2024   Procedure: ARTHROPLASTY, KNEE, TOTAL;  Surgeon: Beverley Evalene BIRCH, MD;  Location: [facility];  Service: Orthopedics;  Laterality: Right;   Patient Active Problem List   Diagnosis Date Noted   S/P total knee arthroplasty, right 08/31/2024   Radiculopathy, lumbar region 03/26/2023   Acid reflux 11/07/2022   Lumbar foraminal stenosis 04/21/2020   Tongue lesion 03/04/2019   Macromastia 08/11/2018   Laryngopharyngeal reflux (LPR) 12/16/2017   Pharyngeal dysphagia 12/16/2017   Right ear impacted cerumen 12/16/2017   Lumbar stenosis with neurogenic claudication 06/18/2017   Bilateral sensorineural hearing loss 10/14/2016   Subjective tinnitus, bilateral 10/14/2016   Tobacco abuse 04/19/2016   Primary osteoarthritis of left hip 04/18/2016   Opioid use disorder, mild, abuse (HCC) 09/08/2015   Schizoaffective disorder, bipolar type (HCC) 09/07/2015   Chronic pain syndrome 09/07/2015   Alcohol use disorder, moderate, in sustained remission (HCC) 09/07/2015   Cocaine use disorder, moderate, in sustained remission (HCC) 09/07/2015   Cannabis use disorder, moderate, in sustained remission (HCC) 09/07/2015   DDD (degenerative disc disease), lumbar 09/07/2015   Hx of fracture of ankle 09/07/2015   Arthropathy of ankle and foot 03/04/2014   Hammertoe 03/04/2014   Cystocele, unspecified 09/03/2013   Symptomatic Fibroids 08/09/2013   Breast lump on left side at 9 o'clock position 06/29/2013   Breast  discharge 06/29/2013   Closed disp fx of lateral condyle of left tibia with routine healing 11/12/2012   Asthma 08/13/2011   History of gastroesophageal reflux (GERD) 08/13/2011   Hyperglycemia 08/13/2011   Post-traumatic stress disorder, unspecified 08/13/2011    PCP: Shelda Atlas, MD  REFERRING PROVIDER: Beverley Evalene BIRCH, MD  REFERRING DIAG: [phone] (ICD-10-CM) - Presence of right artificial knee joint   THERAPY DIAG:  Acute pain of right knee  Muscle weakness (generalized)  Rationale for Evaluation and Treatment: Rehabilitation  ONSET DATE: 08/31/2024  SUBJECTIVE:   SUBJECTIVE STATEMENT: Pt reports she is back to walking as desired without issue.   EVAL: Pt presents to PT s/p physical therapy evaluation and treatment for R TKA performed by Dr. Beverley on 08/31/2024. On arrival and start of evaluation she notes that she is feeling very dizzy while walking back to gym. We checked her BP which was very low, she states that she has not eaten since noon the previous day. Has had a  lot of pain in R knee post surgery, difficult getting around her house and her family is there to help her.   PERTINENT HISTORY: L THA, lumbar fusion, HTN  PAIN:  Are you having pain?  Yes: NPRS scale: 7/10 Worst: 7-8/10 Pain location: R anterior knee Pain description: sharp, sore Aggravating factors: transfers, standing, bending Relieving factors: medication   PRECAUTIONS: None  RED FLAGS: None   WEIGHT BEARING RESTRICTIONS: Yes WBAT  FALLS:  Has patient fallen in last 6 months? No  LIVING ENVIRONMENT: Lives with: lives alone Lives in: House/apartment Stairs: Yes: External: 3 steps; bilateral but cannot reach both Has following equipment at home: Single point cane and Walker - 2 wheeled  OCCUPATION: Not working   PLOF: Independent  PATIENT GOALS: improve knee motion, get back to walking recreationally, improve knee ROM  NEXT MD VISIT: 09/15/2024  OBJECTIVE:  Note: Objective  measures were completed at Evaluation unless otherwise noted.  DIAGNOSTIC FINDINGS: See imaging   PATIENT SURVEYS:  LEFS  Extreme difficulty/unable (0), Quite a bit of difficulty (1), Moderate difficulty (2), Little difficulty (3), No difficulty (4) Survey date:  09/03/2024  Any of your usual work, housework or school activities 0  2. Usual hobbies, recreational or sporting activities 0  3. Getting into/out of the bath 0  4. Walking between rooms 0  5. Putting on socks/shoes 0  6. Squatting  0  7. Lifting an object, like a bag of groceries from the floor 0  8. Performing light activities around your home 0  9. Performing heavy activities around your home 0  10. Getting into/out of a car 0  11. Walking 2 blocks 0  12. Walking 1 mile 0  13. Going up/down 10 stairs (1 flight) 0  14. Standing for 1 hour 0  15.  sitting for 1 hour 0  16. Running on even ground 0  17. Running on uneven ground 0  18. Making sharp turns while running fast 0  19. Hopping  0  20. Rolling over in bed 0  Score total:  0/80   10/15/24: 54/80  COGNITION: Overall cognitive status: Within functional limits for tasks assessed     SENSATION: Light touch: Impaired - lateral R knee  POSTURE: rounded shoulders and forward head and R LE externally rotated   PALPATION: TTP to distal R quad  LOWER EXTREMITY ROM:  Active ROM Right eval Left eval Right 09/07/24 Right 09/09/24 Right 09/14/24 09/16/24 09/22/24 09/30/24  Knee flexion 75 120 88 AAROM 92 AAROM 90 AAROM 98 AA 106 110  Knee extension 17 0 17 15 15 9      (Blank rows = not tested)  LOWER EXTREMITY MMT:  MMT Right eval Left eval Right 10/15/24  Hip flexion   4  Hip extension     Hip abduction     Hip adduction     Hip internal rotation     Hip external rotation     Knee flexion DNT  4+  Knee extension DNT  4+  Ankle dorsiflexion     Ankle plantarflexion     Ankle inversion     Ankle eversion      (Blank rows = not  tested)  FUNCTIONAL TESTS:  30 Second Sit to Stand: 6 reps with UE 10/15/24: 9 reps without UE   TREATMENT: [facility] Adult PT Treatment:  DATE: 10/15/24 Therapeutic Exercise: Review of HEP   Neuromuscular re-ed: Tandem stance 10-15 sec, several attempts SLS  3-5 sec , several attempts  Therapeutic Activity: STS MMT ROM  Goal update  LEFS     [facility] Adult PT Treatment:                                                DATE: 10/12/24 Therapeutic Exercise: Nustep L4 8 min R FAQs 5# 2x15 Seated ham curls BluTB 15x2 Supine hip fallouts BluTB 15x B, 15/15  Bridge against BluTB 15x  Neuromuscular re-ed: Heel raise from 4 in step Runners step 4 in 15/15 Therapeutic Activity: Seated hamstring stretch R 30s x2 SAQs 5# 3s 15x2 Heel slides with strap 15x 122d flexion 16 steps with cane and rail, step to pattern   [facility] Adult PT Treatment:                                                DATE: 10/06/24 Therapeutic Exercise: Nustep L4 8 min R FAQs 3# 2x15 Seated ham curls RTB 15x2 Supine hip fallouts GTB 15x B, 15/15  Neuromuscular re-ed: Heel raise from 4 in step Runners step 4 in 15/15 Therapeutic Activity: Seated hamstring stretch R 30s x2 SAQs 5# 3s 15x2 Heel slides with strap 15x 112d flexion  [facility] Adult PT Treatment:                                                DATE: 09/30/24  Nustep L5 x 6 minutes Ue/LE Step up 6 inch 10 x 2 1 UE support  Seated LAQ 3# 10 x 2 right  Seated h/s curl red band 10 x 2 right  Supine SAQ 4# 10 x 2  SLR 10 x 2 e Heel slide AAROM  H.s stretch with strap   Modalities: Declined           PATIENT EDUCATION:  Education details: eval findings, LEFS, HEP, POC Person educated: Patient Education method: Explanation, Demonstration, and Handouts Education comprehension: verbalized understanding and returned demonstration  HOME EXERCISE PROGRAM: Access Code: [email or web address] URL:  [email or web address] Date: 10/15/2024 Prepared by: Harlene Persons  Exercises - Supine Ankle Pumps  - 8 x daily - 7 x weekly - 2 sets - 20-30 reps - Active straight leg raise  - 1 x daily - 7 x weekly - 2 sets - 10 reps - Long Sitting Quad Set  - 4-5 x daily - 7 x weekly - 3 sets - 10 reps - 5 sec hold - Seated Heel Slide  - 4-5 x daily - 7 x weekly - 2-3 sets - 10 reps - 5 sec hold - Seated Hamstring Stretch  - 4-5 x daily - 7 x weekly - 2-3 reps - 30 sec hold - Seated Knee Extension Stretch with Chair  - 3 x daily - 7 x weekly - 2 min hold - Sit to stand with control  - 1 x daily - 7 x weekly - 2 sets - 10 reps - Sitting Knee Extension with Resistance  - 1 x daily - 7 x  weekly - 3 sets - 10 reps - Seated Hamstring Curls with Resistance  - 1 x daily - 7 x weekly - 3 sets - 10 reps - Standing Tandem Balance with Counter Support  - 1 x daily - 7 x weekly - 1 sets - 3 reps - 10-20 hold  Clinical Impression: pt reports she is pleased with her progress and does not think she needs to continue formal PT. Saw MD this week who gave her meds for inflammation and was also pleased with her progress. She has completed 6 weeks of outpatient PT and has made good progress with ROM, strength and function. LEFS significantly improved. 30 sec STS improved to goal. Pt reports she has returned to desired walking tolerance and has been shopping a lot lately. She has met all of her LTGs and is appropriate for Discharge. Session spent checking goal progress and reviewing/ finalizing advanced HEP.      EVAL: Patient is a 58 y.o. F who was seen today s/p physical therapy evaluation and treatment for R TKA performed by Dr. Beverley on 08/31/2024. Physical findings are consistent with surgery and recovery timeline as pt demonstrates decreased R knee ROM, strength, and functional mobility. Today we could not assess or treatment very much due to symptoms of dizziness and orthostasis, measured BP twice which was  very low and had no symptom resolvement with gingerale and simple sugars. PT assisted pt back to car via w/c, she refused ED transport and notes that she needs to eat something. LEFS shows shows severe disability in performance of home ADLs and community activities. Pt would benefit from skilled PT services working on improving knee ROM, strength, and gait post TKA. Will assess vitals and symptoms of orthostasis next session.   OBJECTIVE IMPAIRMENTS: Abnormal gait, decreased activity tolerance, decreased balance, decreased endurance, decreased mobility, difficulty walking, decreased ROM, decreased strength, and pain.   ACTIVITY LIMITATIONS: carrying, lifting, bending, sitting, standing, squatting, stairs, transfers, bed mobility, and locomotion level  PARTICIPATION LIMITATIONS: meal prep, cleaning, driving, shopping, community activity, occupation, yard work, and school  PERSONAL FACTORS: Fitness and 3+ comorbidities: L THA, lumbar fusion, HTN are also affecting patient's functional outcome.   REHAB POTENTIAL: Good  CLINICAL DECISION MAKING: Evolving/moderate complexity  EVALUATION COMPLEXITY: Moderate   GOALS: Goals reviewed with patient? No  SHORT TERM GOALS: Target date: 09/24/2024   Pt will be compliant and knowledgeable with initial HEP for improved comfort and carryover Baseline: initial HEP given  Goal status: MET   2.  Pt will self report right knee pain no greater than 8/10 for improved comfort and functional ability Baseline: 10/10 at worst Goal status: MET   LONG TERM GOALS: Target date: 11/26/2024   Pt will improve LEFS to no less than 30/80 as proxy for functional improvement with home ADLs and higher level community activity Baseline: 0/80 10/15/24: 54/80 Goal status: MET    2.  Pt will self report right knee pain no greater than 6/10 for improved comfort and functional ability Baseline: 10/10 at worst 10/15/24: 5-6/10 Goal status: MET   3.  Pt will increase 30  Second Sit to Stand rep count by no less than 2 reps for improved balance, strength, and functional mobility Baseline: deferred due to BP  10/15/24: 9 reps  Goal status:  MET   4.  Pt will improve R knee AROM to at least range of 3-115 degrees for improved comfort and functional mobility Baseline: 17-75 10/15/24: 3-120 Goal status: MET   5.  Pt will be able to walk recreationally with no increase in R knee pain for improved comfort and return to desired recreational activity Baseline:  10/15/24: pt reports she has returned to shopping as desired.  Goal status: MET    PLAN:  PT FREQUENCY: 2x/week  PT DURATION: 12 weeks  PLANNED INTERVENTIONS: 97164- PT Re-evaluation, 97110-Therapeutic exercises, 97530- Therapeutic activity, 97112- Neuromuscular re-education, 97535- Self Care, [id number]- Manual therapy, [phone]- Gait training, [phone]- Electrical stimulation (unattended), [phone]- Electrical stimulation (manual), [id number]- Vasopneumatic device, and Patient/Family education      Harlene Persons, [place] 10/15/2024 12:21 PM Phone: [phone] Fax: [phone]  "

## 2024-10-18 ENCOUNTER — Ambulatory Visit: Admitting: Physical Therapy

## 2024-11-10 ENCOUNTER — Other Ambulatory Visit: Payer: Self-pay | Admitting: Physician Assistant

## 2024-11-10 DIAGNOSIS — Z1231 Encounter for screening mammogram for malignant neoplasm of breast: Secondary | ICD-10-CM

## 2024-11-25 ENCOUNTER — Ambulatory Visit

## 2024-12-01 ENCOUNTER — Ambulatory Visit: Attending: Orthopedic Surgery | Admitting: Physical Therapy

## 2024-12-03 ENCOUNTER — Emergency Department (HOSPITAL_COMMUNITY): Admission: EM | Admit: 2024-12-03 | Source: Home / Self Care

## 2024-12-03 ENCOUNTER — Other Ambulatory Visit: Payer: Self-pay

## 2024-12-03 ENCOUNTER — Inpatient Hospital Stay: Admission: AD | Admit: 2024-12-03 | Payer: Self-pay | Source: Intra-hospital | Admitting: Psychiatry

## 2024-12-03 ENCOUNTER — Emergency Department (HOSPITAL_COMMUNITY)

## 2024-12-03 DIAGNOSIS — R45851 Suicidal ideations: Secondary | ICD-10-CM

## 2024-12-03 DIAGNOSIS — F1994 Other psychoactive substance use, unspecified with psychoactive substance-induced mood disorder: Secondary | ICD-10-CM | POA: Insufficient documentation

## 2024-12-03 DIAGNOSIS — F1410 Cocaine abuse, uncomplicated: Secondary | ICD-10-CM | POA: Diagnosis present

## 2024-12-03 LAB — COMPREHENSIVE METABOLIC PANEL WITH GFR
ALT: 8 U/L (ref 0–44)
AST: 13 U/L — ABNORMAL LOW (ref 15–41)
Albumin: 3.3 g/dL — ABNORMAL LOW (ref 3.5–5.0)
Alkaline Phosphatase: 80 U/L (ref 38–126)
Anion gap: 9 (ref 5–15)
BUN: <5 mg/dL — ABNORMAL LOW (ref 6–20)
CO2: 28 mmol/L (ref 22–32)
Calcium: 9.6 mg/dL (ref 8.9–10.3)
Chloride: 103 mmol/L (ref 98–111)
Creatinine, Ser: 0.65 mg/dL (ref 0.44–1.00)
GFR, Estimated: >60 mL/min
Glucose, Bld: 100 mg/dL — ABNORMAL HIGH (ref 70–99)
Potassium: 3 mmol/L — ABNORMAL LOW (ref 3.5–5.1)
Sodium: 141 mmol/L (ref 135–145)
Total Bilirubin: 0.3 mg/dL (ref 0.0–1.2)
Total Protein: 8.1 g/dL (ref 6.5–8.1)

## 2024-12-03 LAB — I-STAT CHEM 8, ED
BUN: 5 mg/dL — ABNORMAL LOW (ref 6–20)
Calcium, Ion: 1.16 mmol/L (ref 1.15–1.40)
Chloride: 102 mmol/L (ref 98–111)
Creatinine, Ser: 0.7 mg/dL (ref 0.44–1.00)
Glucose, Bld: 106 mg/dL — ABNORMAL HIGH (ref 70–99)
HCT: 36 % (ref 36.0–46.0)
Hemoglobin: 12.2 g/dL (ref 12.0–15.0)
Potassium: 3.7 mmol/L (ref 3.5–5.1)
Sodium: 140 mmol/L (ref 135–145)
TCO2: 28 mmol/L (ref 22–32)

## 2024-12-03 LAB — URINE DRUG SCREEN
Amphetamines: NEGATIVE
Barbiturates: NEGATIVE
Benzodiazepines: NEGATIVE
Cocaine: POSITIVE — AB
Fentanyl: NEGATIVE
Methadone Scn, Ur: POSITIVE — AB
Opiates: NEGATIVE
Tetrahydrocannabinol: NEGATIVE

## 2024-12-03 LAB — CBC
HCT: 39.7 % (ref 36.0–46.0)
Hemoglobin: 12.5 g/dL (ref 12.0–15.0)
MCH: 28.5 pg (ref 26.0–34.0)
MCHC: 31.5 g/dL (ref 30.0–36.0)
MCV: 90.6 fL (ref 80.0–100.0)
Platelets: 290 10*3/uL (ref 150–400)
RBC: 4.38 MIL/uL (ref 3.87–5.11)
RDW: 15.3 % (ref 11.5–15.5)
WBC: 7.9 10*3/uL (ref 4.0–10.5)
nRBC: 0 % (ref 0.0–0.2)

## 2024-12-03 LAB — LITHIUM LEVEL: Lithium Lvl: 0.11 mmol/L — ABNORMAL LOW (ref 0.60–1.20)

## 2024-12-03 LAB — ETHANOL: Alcohol, Ethyl (B): <15 mg/dL

## 2024-12-03 MED ORDER — HYDROXYZINE PAMOATE 25 MG PO CAPS
25.0000 mg | ORAL_CAPSULE | Freq: Two times a day (BID) | ORAL | Status: DC
  Filled 2024-12-03: qty 1

## 2024-12-03 MED ORDER — FLUTICASONE FUROATE-VILANTEROL 200-25 MCG/ACT IN AEPB
1.0000 | INHALATION_SPRAY | Freq: Every day | RESPIRATORY_TRACT | Status: AC
  Filled 2024-12-03: qty 28

## 2024-12-03 MED ORDER — ALBUTEROL SULFATE HFA 108 (90 BASE) MCG/ACT IN AERS: 2.0000 | INHALATION_SPRAY | Freq: Four times a day (QID) | RESPIRATORY_TRACT | Status: AC | PRN

## 2024-12-03 MED ORDER — MAGNESIUM SULFATE 2 GM/50ML IV SOLN
2.0000 g | Freq: Once | INTRAVENOUS | Status: AC
  Administered 2024-12-03: 2 g via INTRAVENOUS
  Filled 2024-12-03: qty 50

## 2024-12-03 MED ORDER — LORAZEPAM 2 MG/ML IJ SOLN
1.0000 mg | INTRAMUSCULAR | Status: AC | PRN
  Administered 2024-12-03: 1 mg via INTRAVENOUS
  Administered 2024-12-03 (×2): 2 mg via INTRAVENOUS
  Filled 2024-12-03 (×3): qty 1

## 2024-12-03 MED ORDER — LORATADINE 10 MG PO TABS
10.0000 mg | ORAL_TABLET | Freq: Every day | ORAL | Status: AC
  Administered 2024-12-03: 10 mg via ORAL
  Filled 2024-12-03: qty 1

## 2024-12-03 MED ORDER — PAROXETINE HCL ER 12.5 MG PO TB24: 25.0000 mg | ORAL_TABLET | Freq: Every day | ORAL | Status: AC

## 2024-12-03 MED ORDER — LORAZEPAM 1 MG PO TABS: 1.0000 mg | ORAL_TABLET | ORAL | Status: AC | PRN

## 2024-12-03 MED ORDER — POTASSIUM CHLORIDE CRYS ER 20 MEQ PO TBCR
40.0000 meq | EXTENDED_RELEASE_TABLET | Freq: Once | ORAL | Status: AC
  Administered 2024-12-03: 40 meq via ORAL
  Filled 2024-12-03: qty 2

## 2024-12-03 MED ORDER — THIAMINE MONONITRATE 100 MG PO TABS
100.0000 mg | ORAL_TABLET | Freq: Every day | ORAL | Status: AC
  Administered 2024-12-03: 100 mg via ORAL
  Filled 2024-12-03: qty 1

## 2024-12-03 MED ORDER — ADULT MULTIVITAMIN W/MINERALS CH
1.0000 | ORAL_TABLET | Freq: Every day | ORAL | Status: AC
  Administered 2024-12-03: 1 via ORAL
  Filled 2024-12-03: qty 1

## 2024-12-03 MED ORDER — QUETIAPINE FUMARATE 100 MG PO TABS
300.0000 mg | ORAL_TABLET | Freq: Every day | ORAL | Status: AC
  Administered 2024-12-03: 300 mg via ORAL
  Filled 2024-12-03: qty 1

## 2024-12-03 MED ORDER — LITHIUM CARBONATE 300 MG PO CAPS
300.0000 mg | ORAL_CAPSULE | Freq: Every day | ORAL | Status: AC
  Administered 2024-12-03: 300 mg via ORAL
  Filled 2024-12-03: qty 1

## 2024-12-03 MED ORDER — FOLIC ACID 1 MG PO TABS
1.0000 mg | ORAL_TABLET | Freq: Every day | ORAL | Status: AC
  Administered 2024-12-03: 1 mg via ORAL
  Filled 2024-12-03: qty 1

## 2024-12-03 MED ORDER — PANTOPRAZOLE SODIUM 40 MG PO TBEC
40.0000 mg | DELAYED_RELEASE_TABLET | Freq: Every day | ORAL | Status: AC
  Administered 2024-12-03: 40 mg via ORAL
  Filled 2024-12-03: qty 1

## 2024-12-03 MED ORDER — HYDROXYZINE HCL 25 MG PO TABS
25.0000 mg | ORAL_TABLET | Freq: Two times a day (BID) | ORAL | Status: AC
  Administered 2024-12-03: 25 mg via ORAL
  Filled 2024-12-03: qty 1

## 2024-12-03 MED ORDER — POTASSIUM CHLORIDE ER 10 MEQ PO TBCR: 10.0000 meq | EXTENDED_RELEASE_TABLET | Freq: Every day | ORAL | Status: AC

## 2024-12-03 MED ORDER — ACETAMINOPHEN 500 MG PO TABS: 1000.0000 mg | ORAL_TABLET | Freq: Four times a day (QID) | ORAL | Status: AC | PRN

## 2024-12-03 MED ORDER — THIAMINE HCL 100 MG/ML IJ SOLN: 100.0000 mg | Freq: Every day | INTRAMUSCULAR | Status: AC

## 2024-12-03 NOTE — ED Notes (Signed)
 Psych to bedside to assess pt

## 2024-12-03 NOTE — ED Provider Notes (Signed)
 " [place] EMERGENCY DEPARTMENT AT [facility] Provider Note  CSN: [id number] Arrival date & time: 12/03/24 1055  Chief Complaint(s) Suicidal  HPI Alicia Blake is a 59 y.o. female who is here today due to SI.  Patient reports that she has been having increasing thoughts of self-harm secondary to relapsing on alcohol and crack.  She denies a clear plan.   Past Medical History Past Medical History:  Diagnosis Date   Asthma    Bipolar affect, depressed (HCC)    Bronchitis    Chronic back pain    Closed left ankle fracture    COPD (chronic obstructive pulmonary disease) (HCC)    DJD (degenerative joint disease)    BACK   GERD (gastroesophageal reflux disease)    Hypertension    Pneumonia    Pre-diabetes    dr entered diabetes without complications- no meds does not test sugar at home   PTSD (post-traumatic stress disorder)    Scoliosis    Seizures (HCC)    when drinking last yrs ago   Shortness of breath dyspnea    exersion   Substance abuse (HCC)    crack cocaine, 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, none since   Thyroid disease    UTI (lower urinary tract infection)    Patient Active Problem List   Diagnosis Date Noted   S/P total knee arthroplasty, right 08/31/2024   Radiculopathy, lumbar region 03/26/2023   Acid reflux 11/07/2022   Lumbar foraminal stenosis 04/21/2020   Tongue lesion 03/04/2019   Macromastia 08/11/2018   Laryngopharyngeal reflux (LPR) 12/16/2017   Pharyngeal dysphagia 12/16/2017   Right ear impacted cerumen 12/16/2017   Lumbar stenosis with neurogenic claudication 06/18/2017   Bilateral sensorineural hearing loss 10/14/2016   Subjective tinnitus, bilateral 10/14/2016   Tobacco abuse 04/19/2016   Primary osteoarthritis of left hip 04/18/2016   Opioid use disorder, mild, abuse (HCC) 09/08/2015   Schizoaffective disorder, bipolar type (HCC) 09/07/2015   Chronic pain syndrome 09/07/2015   Alcohol use disorder, moderate, in sustained  remission (HCC) 09/07/2015   Cocaine use disorder, moderate, in sustained remission (HCC) 09/07/2015   Cannabis use disorder, moderate, in sustained remission (HCC) 09/07/2015   DDD (degenerative disc disease), lumbar 09/07/2015   Hx of fracture of ankle 09/07/2015   Arthropathy of ankle and foot 03/04/2014   Hammertoe 03/04/2014   Cystocele, unspecified 09/03/2013   Symptomatic Fibroids 08/09/2013   Breast lump on left side at 9 o'clock position 06/29/2013   Breast discharge 06/29/2013   Closed disp fx of lateral condyle of left tibia with routine healing 11/12/2012   Asthma 08/13/2011   History of gastroesophageal reflux (GERD) 08/13/2011   Hyperglycemia 08/13/2011   Post-traumatic stress disorder, unspecified 08/13/2011   Home Medication(s) Prior to Admission medications  Medication Sig Start Date End Date Taking? Authorizing Provider  ABILIFY  ASIMTUFII 720 MG/2.4ML PRSY Inject 720 mg into the muscle every 8 (eight) weeks.    [provider]  acetaminophen  (TYLENOL ) 500 MG tablet Take 2 tablets (1,000 mg total) by mouth every 6 (six) hours as needed for mild pain (pain score 1-3) or moderate pain (pain score 4-6). 08/31/24   Gawne, Meghan M, PA-C  ADVAIR DISKUS 250-50 MCG/ACT AEPB Inhale 1 puff into the lungs 2 (two) times daily as needed (shortness of breath). 06/06/24   [provider]  albuterol  (PROVENTIL ) (2.5 MG/3ML) 0.083% nebulizer solution Take 3 mLs (2.5 mg total) by nebulization every 6 (six) hours as needed for wheezing or shortness of breath.  Patient not taking: Reported on 04/01/2023 12/17/21   Raspet, Erin K, PA-C  albuterol  (VENTOLIN  HFA) 108 (90 Base) MCG/ACT inhaler Inhale 2 puffs into the lungs every 6 (six) hours as needed for wheezing. 12/17/21   Raspet, Rocky POUR, PA-C  aspirin  EC 81 MG tablet Take 1 tablet (81 mg total) by mouth 2 (two) times daily. To prevent blood clots for 30 days after surgery. 08/31/24   Gawne, Meghan M, PA-C  budesonide -formoterol   (SYMBICORT ) 160-4.5 MCG/ACT inhaler Inhale 2 puffs into the lungs in the morning and at bedtime. Patient not taking: Reported on 08/20/2024 11/27/21   Crain, Whitney L, PA  cetirizine (ZYRTEC) 10 MG tablet Take 10 mg by mouth daily. 05/30/22   [provider]  esomeprazole (NEXIUM) 40 MG capsule Take 40 mg by mouth daily. 09/22/19   [provider]  furosemide  (LASIX ) 20 MG tablet Take 20 mg by mouth daily. 10/30/22   [provider]  hydrOXYzine  (VISTARIL ) 25 MG capsule Take 25 mg by mouth daily as needed for anxiety. 06/16/19   [provider]  lithium  300 MG tablet Take 300 mg by mouth in the morning. 10/07/22   [provider]  losartan  (COZAAR ) 25 MG tablet Take 25 mg by mouth daily. 09/18/19   Rosalea Rosina SAILOR, PA  methocarbamol  (ROBAXIN ) 750 MG tablet Take 1 tablet (750 mg total) by mouth every 8 (eight) hours as needed for muscle spasms. DO NOT TAKE TIZANIDINE  WHILE TAKING THIS MEDICINE! 08/31/24   Gawne, Meghan M, PA-C  montelukast  (SINGULAIR ) 10 MG tablet Take 10 mg by mouth daily. 04/24/22   [provider]  ondansetron  (ZOFRAN -ODT) 4 MG disintegrating tablet Take 1 tablet (4 mg total) by mouth every 8 (eight) hours as needed for nausea or vomiting. 08/31/24   Gawne, Meghan M, PA-C  oxyCODONE  (ROXICODONE ) 5 MG immediate release tablet Take 1 tablet (5 mg total) by mouth every 4 (four) hours as needed for severe pain (pain score 7-10). in knee after surgery 08/31/24   Ted Gerard HERO, PA-C  PARoxetine  (PAXIL -CR) 25 MG 24 hr tablet Take 25 mg by mouth daily. 10/07/22   [provider]  potassium chloride  (KLOR-CON ) 10 MEQ tablet Take 10 mEq by mouth daily. 04/24/22   [provider]  QUEtiapine  (SEROQUEL ) 300 MG tablet Take 300 mg by mouth at bedtime. 10/29/22   [provider]  sennosides-docusate sodium  (SENOKOT-S) 8.6-50 MG tablet Take 2 tablets by mouth daily as needed for constipation (while taking narcotics). 08/31/24    Gawne, Meghan M, PA-C  triamcinolone  (NASACORT  ALLERGY 24HR) 55 MCG/ACT AERO nasal inhaler Place 2 sprays into the nose daily as needed (allergies).    [provider]                                                                                                                                    Past Surgical History Past Surgical History:  Procedure Laterality Date   2 LEFT TOES SURGERY  12/2014   finished only on right side   ABDOMINAL EXPOSURE N/A 03/26/2023   Procedure: ABDOMINAL EXPOSURE;  Surgeon: Gretta Lonni PARAS, MD;  Location: [facility] OR;  Service: Vascular;  Laterality: N/A;   ABDOMINAL EXPOSURE N/A 04/02/2023   Procedure: ABDOMINAL EXPOSURE;  Surgeon: Gretta Lonni PARAS, MD;  Location: [facility] OR;  Service: Vascular;  Laterality: N/A;   ANTERIOR LUMBAR FUSION N/A 03/26/2023   Procedure: REMOVAL OF POSTERIOR HARDWARE;  Surgeon: Beuford Anes, MD;  Location: [facility] OR;  Service: Orthopedics;  Laterality: N/A;   ANTERIOR LUMBAR FUSION N/A 03/26/2023   Procedure: LUMBAR FOUR - LUMBAR FIVE, LUMBAR FIVE - SACRUM ONE ANTERIOR LUMBAR INTERBODY FUSION WITH INSTRUMENTATION AND ALLOGRAFT;  Surgeon: Beuford Anes, MD;  Location: [facility] OR;  Service: Orthopedics;  Laterality: N/A;   ANTERIOR LUMBAR FUSION N/A 04/02/2023   Procedure: REVISION ANTERIOR LUMBAR FUSION L5-S1;  Surgeon: Beuford Anes, MD;  Location: [facility] OR;  Service: Orthopedics;  Laterality: N/A;   BACK SURGERY     BALLOON DILATION N/A 12/14/2015   Procedure: BALLOON DILATION;  Surgeon: Norleen Hint, MD;  Location: [facility] ENDOSCOPY;  Service: Endoscopy;  Laterality: N/A;   COLONOSCOPY WITH PROPOFOL  N/A 12/14/2015   Procedure: COLONOSCOPY WITH PROPOFOL ;  Surgeon: Norleen Hint, MD;  Location: [facility]COPY;  Service: Endoscopy;  Laterality: N/A;   ESOPHAGOGASTRODUODENOSCOPY (EGD) WITH PROPOFOL  N/A 12/14/2015   Procedure: ESOPHAGOGASTRODUODENOSCOPY (EGD) WITH PROPOFOL ;  Surgeon: Norleen Hint, MD;  Location: [facility];  Service: Endoscopy;   Laterality: N/A;   FRACTURE SURGERY     JOINT REPLACEMENT     left hip   LEFT ANKLE SURGERY  12/2014   LUMBAR LAMINECTOMY/DECOMPRESSION MICRODISCECTOMY N/A 06/18/2017   Procedure: LAMINECTOMY AND FORAMINOTOMY LUMBAR FOUR - LUMBAR FIVE;  Surgeon: Gillie Duncans, MD;  Location: [facility] OR;  Service: Neurosurgery;  Laterality: N/A;  LAMINECTOMY AND FORAMINOTOMY LUMBAR 4- LUMBAR 5   LUMBAR WOUND DEBRIDEMENT N/A 03/10/2020   Procedure: Lumbar Wound Debridement;  Surgeon: Gillie Duncans, MD;  Location: [facility] OR;  Service: Neurosurgery;  Laterality: N/A;  posterior   NECK SURGERY  AS TEENAGER   STITCHES TO NECK    ORIF ANKLE FRACTURE Left 07/24/2021   Procedure: OPEN REDUCTION INTERNAL FIXATION (ORIF) ANKLE FRACTURE;  Surgeon: Beverley Evalene BIRCH, MD;  Location: [facility];  Service: Orthopedics;  Laterality: Left;   SURGERY FOR CUT ON STOMACH  TEENAGER   TOTAL HIP ARTHROPLASTY Left 05/14/2016   Procedure: TOTAL HIP ARTHROPLASTY ANTERIOR APPROACH;  Surgeon: Evalene BIRCH Beverley, MD;  Location: [facility] OR;  Service: Orthopedics;  Laterality: Left;   TOTAL KNEE ARTHROPLASTY Right 08/31/2024   Procedure: ARTHROPLASTY, KNEE, TOTAL;  Surgeon: Beverley Evalene BIRCH, MD;  Location: [facility] ORS;  Service: Orthopedics;  Laterality: Right;   Family History Family History  Problem Relation Age of Onset   Hypertension Mother    Hypertension Maternal Grandmother    Hypertension Paternal Grandmother    Diabetes Paternal Grandmother    Hypertension Paternal Grandfather    Diabetes Paternal Grandfather    Alcohol abuse Paternal Aunt    Mental illness Cousin    Heart attack Cousin    Heart attack Maternal Uncle     Social History Social History[1] Allergies Risperidone and paliperidone  Review of Systems Review of Systems  Physical Exam Vital Signs  I have reviewed the triage vital signs BP (!) 146/86 (BP Location: Left Arm)   Pulse 90   Temp 98.6 F (37 C) (Oral)  Resp 18   LMP 12/09/2015 Comment: very  irregular  SpO2 94%   Physical Exam Vitals and nursing note reviewed.  HENT:     Head: Normocephalic.  Eyes:     Pupils: Pupils are equal, round, and reactive to light.  Cardiovascular:     Rate and Rhythm: Normal rate.  Pulmonary:     Effort: Pulmonary effort is normal. No respiratory distress.     Breath sounds: No wheezing.  Abdominal:     General: Abdomen is flat.     Palpations: Abdomen is soft.  Musculoskeletal:     Cervical back: Normal range of motion.  Neurological:     General: No focal deficit present.     Mental Status: She is alert.  Psychiatric:        Thought Content: Thought content normal.     Comments: Patient is SI.  No HI.  She does not appear to be responding to internal stimuli.     ED Results and Treatments Labs (all labs ordered are listed, but only abnormal results are displayed) Labs Reviewed  COMPREHENSIVE METABOLIC PANEL WITH GFR - Abnormal; Notable for the following components:      Result Value   Potassium 3.0 (*)    Glucose, Bld 100 (*)    BUN <5 (*)    Albumin  3.3 (*)    AST 13 (*)    All other components within normal limits  URINE DRUG SCREEN - Abnormal; Notable for the following components:   Cocaine POSITIVE (*)    Methadone Scn, Ur POSITIVE (*)    All other components within normal limits  ETHANOL  CBC                                                                                                                          Radiology DG Chest 1 View Result Date: 12/03/2024 CLINICAL DATA:  Cough EXAM: CHEST  1 VIEW COMPARISON:  December 08, 2022. FINDINGS: The heart size and mediastinal contours are within normal limits. Both lungs are clear. The visualized skeletal structures are unremarkable. IMPRESSION: No active disease. Electronically Signed   By: Lynwood Landy Raddle M.D.   On: 12/03/2024 14:46    Pertinent labs & imaging results that were available during my care of the patient were reviewed by me and considered in my medical  decision making (see MDM for details).  Medications Ordered in ED Medications  magnesium  sulfate IVPB 2 g 50 mL (2 g Intravenous New Bag/Given 12/03/24 1502)  LORazepam  (ATIVAN ) tablet 1-4 mg (has no administration in time range)    Or  LORazepam  (ATIVAN ) injection 1-4 mg (has no administration in time range)  thiamine  (VITAMIN B1) tablet 100 mg (100 mg Oral Given 12/03/24 1500)    Or  thiamine  (VITAMIN B1) injection 100 mg ( Intravenous See Alternative 12/03/24 1500)  folic acid  (FOLVITE ) tablet 1 mg (1 mg Oral Given 12/03/24 1500)  multivitamin with minerals tablet 1 tablet (1 tablet Oral  Given 12/03/24 1500)  acetaminophen  (TYLENOL ) tablet 1,000 mg (has no administration in time range)  fluticasone  furoate-vilanterol (BREO ELLIPTA ) 200-25 MCG/ACT 1 puff (has no administration in time range)  albuterol  (VENTOLIN  HFA) 108 (90 Base) MCG/ACT inhaler 2 puff (has no administration in time range)  loratadine  (CLARITIN ) tablet 10 mg (has no administration in time range)  pantoprazole  (PROTONIX ) EC tablet 40 mg (has no administration in time range)  potassium chloride  (KLOR-CON ) CR tablet 10 mEq (has no administration in time range)  potassium chloride  SA (KLOR-CON  M) CR tablet 40 mEq (40 mEq Oral Given 12/03/24 1500)                                                                                                                                     Procedures Procedures  (including critical care time)  Medical Decision Making / ED Course   This patient presents to the ED for concern of SI, this involves an extensive number of treatment options, and is a complaint that carries with it a high risk of complications and morbidity.  The differential diagnosis includes SI, alcohol abuse  MDM: On exam, patient overall looks well.  She does not appear to be in acute alcohol withdrawal.  Blood work obtained on the patient did show hypokalemia, and with her history of alcohol abuse, and presuming a low  magnesium .  Will replete both.  Will be able to medically clear patient when she has received appropriate supplementation.  Have ordered patient's home medications.  My independent review the patient's chest x-ray shows no pneumonia.  Reassessment 3:55 PM-patient's electrolytes repleted, have ordered daily potassium for the patient.  She is medically cleared.  I have referred to TTS.  Patient is here voluntarily.     Additional history obtained: - -External records from outside source obtained and reviewed including: Recent PCP office note.   Lab Tests: -I ordered, reviewed, and interpreted labs.   The pertinent results include:   Labs Reviewed  COMPREHENSIVE METABOLIC PANEL WITH GFR - Abnormal; Notable for the following components:      Result Value   Potassium 3.0 (*)    Glucose, Bld 100 (*)    BUN <5 (*)    Albumin  3.3 (*)    AST 13 (*)    All other components within normal limits  URINE DRUG SCREEN - Abnormal; Notable for the following components:   Cocaine POSITIVE (*)    Methadone Scn, Ur POSITIVE (*)    All other components within normal limits  ETHANOL  CBC          Imaging Studies ordered: I ordered imaging studies including chest x-ray I independently visualized and interpreted imaging. I agree with the radiologist interpretation   Medicines ordered and prescription drug management: Meds ordered this encounter  Medications   potassium chloride  SA (KLOR-CON  M) CR tablet 40 mEq   magnesium  sulfate IVPB 2 g 50 mL  OR Linked Order Group    LORazepam  (ATIVAN ) tablet 1-4 mg     CIWA-AR < 5 =:   0 mg     CIWA-AR 5 -10 =:   1 mg     CIWA-AR 11 -15 =:   2 mg     CIWA-AR 16 -20 =:   3 mg     CIWA-AR 16 -20 =:   Recheck CIWA-AR in 1 hour; if > 20 notify MD     CIWA-AR > 20 =:   4 mg     CIWA-AR > 20 =:   Call Rapid Response    LORazepam  (ATIVAN ) injection 1-4 mg     CIWA-AR < 5 =:   0 mg     CIWA-AR 5 -10 =:   1 mg     CIWA-AR 11 -15 =:   2 mg      CIWA-AR 16 -20 =:   3 mg     CIWA-AR 16 -20 =:   Recheck CIWA-AR in 1 hour; if > 20 notify MD     CIWA-AR > 20 =:   4 mg     CIWA-AR > 20 =:   Call Rapid Response   OR Linked Order Group    thiamine  (VITAMIN B1) tablet 100 mg    thiamine  (VITAMIN B1) injection 100 mg   folic acid  (FOLVITE ) tablet 1 mg   multivitamin with minerals tablet 1 tablet   acetaminophen  (TYLENOL ) tablet 1,000 mg   fluticasone  furoate-vilanterol (BREO ELLIPTA ) 200-25 MCG/ACT 1 puff   albuterol  (VENTOLIN  HFA) 108 (90 Base) MCG/ACT inhaler 2 puff   loratadine  (CLARITIN ) tablet 10 mg   pantoprazole  (PROTONIX ) EC tablet 40 mg   potassium chloride  (KLOR-CON ) CR tablet 10 mEq    -I have reviewed the patients home medicines and have made adjustments as needed   Cardiac Monitoring: The patient was maintained on a cardiac monitor.  I personally viewed and interpreted the cardiac monitored which showed an underlying rhythm of: Normal sinus rhythm  Social Determinants of Health:  Factors impacting patients care include: Access to primary care   Reevaluation: After the interventions noted above, I reevaluated the patient and found that they have :improved  Co morbidities that complicate the patient evaluation  Past Medical History:  Diagnosis Date   Asthma    Bipolar affect, depressed (HCC)    Bronchitis    Chronic back pain    Closed left ankle fracture    COPD (chronic obstructive pulmonary disease) (HCC)    DJD (degenerative joint disease)    BACK   GERD (gastroesophageal reflux disease)    Hypertension    Pneumonia    Pre-diabetes    dr entered diabetes without complications- no meds does not test sugar at home   PTSD (post-traumatic stress disorder)    Scoliosis    Seizures (HCC)    when drinking last yrs ago   Shortness of breath dyspnea    exersion   Substance abuse (HCC)    crack cocaine, 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, none since   Thyroid disease    UTI (lower urinary tract infection)           Final Clinical Impression(s) / ED Diagnoses Final diagnoses:  Suicidal ideation     @PCDICTATION @     [1]  Social History Tobacco Use   Smoking status: Every Day    Current packs/day: 0.50    Average packs/day: 0.5 packs/day for 26.0 years (13.0 ttl pk-yrs)    Types:  Cigarettes   Smokeless tobacco: Never  Vaping Use   Vaping status: Never Used  Substance Use Topics   Alcohol use: No    Comment: quit date 01/17/2013   Drug use: No    Types: Crack cocaine, Heroin    Comment: none since 03-2020     Mannie Pac T, DO 12/03/24 1559  "

## 2024-12-03 NOTE — ED Notes (Signed)
 Patient belonging bags in locker 30

## 2024-12-03 NOTE — ED Notes (Signed)
 Patient stated not able to give urine until they have something to drink

## 2024-12-03 NOTE — ED Notes (Signed)
" °   12/03/24 1929  CIWA-Ar  Nausea and Vomiting 1  Tactile Disturbances 0  Tremor 1  Auditory Disturbances 0  Paroxysmal Sweats 4  Visual Disturbances 0  Anxiety 2  Headache, Fullness in Head 1  Agitation 0  Orientation and Clouding of Sensorium 2  CIWA-Ar Total 11   2 mg Ativan  given IV "

## 2024-12-03 NOTE — ED Provider Notes (Signed)
 RN messaged me concern for ongoing ETOH withdrawal symptoms despite ativan . On my evaluation, HR 90s, BP 140-150s. Patient is asleep on exam. She's mildly diaphoretic appearing on the forehead, no tongue fasciculations or extremity tremors. She denies N/V, headache and feeling well. CIWA 4 at this time. Patient remains medically cleared for psychiatric disposition.     Kingsley, Edmond Ginsberg K, DO 12/03/24 2315

## 2024-12-03 NOTE — ED Triage Notes (Signed)
 Pt has c/o suicidal thoughts with a plan, states she recently relapsed on alcohol and crack

## 2024-12-03 NOTE — Consult Note (Cosign Needed Addendum)
 [facility] Psychiatric Consult Initial  Patient Name: .NEDRA MCINNIS  MRN: [id number]  DOB: May 24, 1966  Consult Order details:  Orders (From admission, onward)     Start     Ordered   12/03/24 1558  CONSULT TO CALL ACT TEAM       Ordering Provider: Mannie Fairy DASEN, DO  Provider:  (Not yet assigned)  Question:  Reason for Consult?  Answer:  Psych consult   12/03/24 1557             Mode of Visit: In person    Psychiatry Consult Evaluation  Service Date: December 03, 2024 LOS:  LOS: 0 days  Chief Complaint feeling like committing suicide  Primary Psychiatric Diagnoses  Substance induced mood disorder  2.   Cocaine use  3.  SI   Assessment  ROSE-MARIE HICKLING is a 59 y.o. female admitted: Presented to the ED on 12/03/2024 11:11 AM for suicidal thoughts and relapse on alcohol and cocaine. She carries the psychiatric diagnoses of bipolar, PTSD, cocaine use, personality disorder, schizoaffective bipolar type and has a past medical history of hypertension, asthma, bronchitis, and COPD.  Her current presentation of cocaine use, alcohol use, suicidal ideations with a plan, depressive and anxiety most consistent with substance-induced mood disorder. She meets criteria for inpatient psychiatric treatment based on active suicidal ideations with multiple stated plans. Current outpatient psychotropic medications include Paxil , Lithium , Seroquel , and Hydroxyzine  and historically she has had a positive response to these medications. She was compliant with medications prior to admission as evidenced by reporting medication compliance.   On initial examination, patient is alert and oriented x 4. Her thought process is linear and goal oriented. Thought content is positive for suicidal ideations with multiple plans and negative for active HI/AVH. Objectively, no signs of acute psychosis, including no paranoia or delusional ideations on exam. Her mood is depressed/anxious and affect is congruent. Her  speech is clear and coherent. She has fair eye contact. She is calm and cooperative and does not appear to be in acute distress on exam.   Please see plan below for detailed recommendations.   Diagnoses:  Active Hospital problems: Principal Problem:   Suicidal ideation Active Problems:   Cocaine abuse (HCC)   Substance induced mood disorder (HCC)    Plan   ## Psychiatric Medication Recommendations:  Restart home psychotropic medications which includes; Lithium  300 mg po daily Seroquel  300 mg po QHS Paxil  25 mg po daily Vistaril  25 mg po BID ## Medical Decision Making Capacity: Not specifically addressed in this encounter  ## Further Work-up:  -- Add lithium  level -- Add EKG to assess QTc, patient is prescribed antipsychotic medications known to prolong QTc -- Add TSH, A1c, and lipid panel to admission order set if accepted to [facility] -- most recent EKG is pending -- Pertinent labwork reviewed earlier this admission includes: CMP, CBC, BAL, UDS   ## Disposition:-- We recommend inpatient psychiatric hospitalization when medically cleared. Patient is under voluntary admission at this time. Dr. Mannie, EDP., informed of disposition and place of care.  ## Behavioral / Environmental: - No specific recommendations at this time.     ## Safety and Observation Level:  - Based on my clinical evaluation, I estimate the patient to be at high risk of self harm in the current setting. - At this time, we recommend  1:1 Observation. This decision is based on my review of the chart including patient's history and current presentation, interview of the patient,  mental status examination, and consideration of suicide risk including evaluating suicidal ideation, plan, intent, suicidal or self-harm behaviors, risk factors, and protective factors. This judgment is based on our ability to directly address suicide risk, implement suicide prevention strategies, and develop a safety plan while the  patient is in the clinical setting. Please contact our team if there is a concern that risk level has changed.  CSSR Risk Category:C-SSRS RISK CATEGORY: High Risk  Suicide Risk Assessment: Patient has following modifiable risk factors for suicide: active suicidal ideation, current symptoms: anxiety/panic, insomnia, impulsivity, anhedonia, hopelessness, and triggering events, which we are addressing by recommending inpatient psychiatric treatment. Patient has following non-modifiable or demographic risk factors for suicide: history of suicide attempt and psychiatric hospitalization Patient has the following protective factors against suicide: Access to outpatient mental health care  Thank you for this consult request. Recommendations have been communicated to the primary team.  We will continue to follow at this time.   Teresa Wyline CROME, NP       History of Present Illness  Relevant Aspects of Hospital ED Course: Admitted on 12/03/2024 for complaints of suicidal thoughts with plan and recent relapse on cocaine and alcohol. UDS positive for cocaine and methadone. BAL negative on arrival.  Patient Report:  Patient reports feeling like committing suicide since Thursday when she relapsed on cocaine and alcohol and went on a binge. She reports a sobriety of 4.5 years prior to relapsing. She identifies feeling depressed as reason for relapse. She reports using cocaine and alcohol every day from last Thursday to Wednesday morning. She reports on average spending 1,000 and some dollars on smoking and snorting crack cocaine. She reports drinking a bottle of liquor along with wine daily. She reports smoking an unknown amount of marijuana. She reports alcohol withdrawal symptoms of shivering, feeling cold, body aches and states that she has not slept since Thursday. She reports a history of alcohol withdrawal seizures and delirium tremens 10 years ago. She reports several substance abuse rehabilitation  treatments 12 years ago.  She reports feeling suicidal because she relapsed on drugs. She endorses suicidal ideations with multiple plans which includes slitting her wrist, getting into bathtub and hanging herself, poisoning herself or getting high on drugs and overdosing. She reports 1 suicide attempt 15 years ago and does not recall how she attempted suicide at that time. She denies current self injurious behaviors but reports a history of cutting 10 years ago.  She reports feeling depressed and describes her depressive symptoms as feeling weak, sadness, irritability, decreased energy, hopelessness, worthlessness, and decreased appetite. She reports feeling anxious and describes her anxiety as feeling stressed. She denies active hallucinations but reports intermittent auditory hallucinations of hearing someone call her name and scary noise since she relapsed. She reports a poor appetite and reports losing 10 to 15 pounds over the course of 2 weeks. She reports poor sleep and states that she has not slept since Thursday.  She reports outpatient psychiatry with Dr. Akintayo at [facility] and is prescribed Paxil , lithium , Vistaril , and Seroquel . Medications verified using medication dispensary report. She reports outpatient therapy with Katelyn Hough biweekly.   She lives alone. She denies access to firearms. She denies legal issues. She is unemployed and receives disability.  She reports medical history of hypertension, asthma, bronchitis, COPD and recent right knee surgery in November 2025.  She states that she is able to perform ADLs. She is ambulatory without assistance on examination.   Psych ROS:  Depression: Yes, decreased  energy, hopelessness, worthlessness, irritability, poor appetite and poor sleep Anxiety: Yes, feeling stressed Mania (lifetime and current): History of mania. No current symptoms Psychosis: (lifetime and current): Intermittent auditory hallucinations that she  describes as hearing someone call her name and hearing scary noises  Review of Systems  Respiratory: Negative.    Cardiovascular: Negative.   Musculoskeletal:        Right knee surgery in November 2025  Neurological: Negative.   Psychiatric/Behavioral:  Positive for depression, substance abuse and suicidal ideas. The patient is nervous/anxious and has insomnia.      Psychiatric and Social History  Psychiatric History:  Information collected from patient and EMR  Prev Dx/Sx: bipolar, PTSD, cocaine use, personality disorder, schizoaffective bipolar type Current Psych Provider: Dr. Akintayo, at [facility]  Home Meds (current): Paxil , Lithium , Seroquel , and Hydroxyzine  Therapy: Bi-weekly therapy w/Katelyn Hough   Prior Psych Hospitalization: Yes, [facility] from 11/13/14 to 11/18/14 Prior Self Harm: Yes, history of cutting 10 years ago Prior Violence: No  Family Psych History: Yes, (M) cousin and (M) aunt history of schizophrenia Family Hx suicide: No  Social History:  Developmental Hx: Normal Educational Hx: 10 th grade Occupational Hx: unemployed and receives disability  Legal Hx: No Living Situation: Lives alone  Access to weapons/lethal means: No  Substance History Alcohol: Yes  Type of alcohol Liquor and Wine  Last Drink Wednesday  Number of drinks per day: Bottle of liquor  History of alcohol withdrawal seizures: Yes, 10 years ago History of DT's: Yes, 10 years ago Illicit drugs: Cocaine use and THC Prescription drug abuse: No Rehab hx: Yes, several 12 years ago  Exam Findings  Physical Exam:  Vital Signs:  Temp:  [98.2 F (36.8 C)-98.6 F (37 C)] 98.6 F (37 C) (02/06 1450) Pulse Rate:  [90-100] 90 (02/06 1450) Resp:  [16-18] 18 (02/06 1450) BP: (145-146)/(75-86) 146/86 (02/06 1450) SpO2:  [94 %-95 %] 94 % (02/06 1450) Blood pressure (!) 146/86, pulse 90, temperature 98.6 F (37 C), temperature source Oral, resp. rate 18, last menstrual period  12/09/2015, SpO2 94%. There is no height or weight on file to calculate BMI.  Physical Exam Cardiovascular:     Rate and Rhythm: Normal rate.  Pulmonary:     Effort: Pulmonary effort is normal.  Musculoskeletal:        General: Normal range of motion.  Neurological:     Mental Status: She is alert and oriented to person, place, and time.     Mental Status Exam: General Appearance: Casual  Orientation:  Full (Time, Place, and Person)  Memory:  Immediate;   Fair Recent;   Fair Remote;   Fair  Concentration:  Concentration: Fair and Attention Span: Fair  Recall:  Fair  Attention  Fair  Eye Contact:  Fair  Speech:  Normal Rate  Language:  Fair  Volume:  Normal  Mood: Depressed/Anxious   Affect:  Flat  Thought Process:  Coherent, Goal Directed, and Linear  Thought Content:  Logical  Suicidal Thoughts:  No  Homicidal Thoughts:  No  Judgement:  Fair  Insight:  Fair  Psychomotor Activity:  Normal  Akathisia:  Yes  Fund of Knowledge:  Fair      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  Family History:  The patient's family history includes Alcohol abuse in her paternal aunt; Diabetes in her paternal grandfather and paternal grandmother; Heart attack in her cousin and maternal uncle; Hypertension in her maternal grandmother, mother, paternal grandfather, and paternal grandmother; Mental illness in her cousin.  Medical History: Past Medical History:  Diagnosis Date   Asthma    Bipolar affect, depressed (HCC)    Bronchitis    Chronic back pain    Closed left ankle fracture    COPD (chronic obstructive pulmonary disease) (HCC)    DJD (degenerative joint disease)    BACK   GERD (gastroesophageal reflux disease)    Hypertension    Pneumonia    Pre-diabetes     dr entered diabetes without complications- no meds does not test sugar at home   PTSD (post-traumatic stress disorder)    Scoliosis    Seizures (HCC)    when drinking last yrs ago   Shortness of breath dyspnea    exersion   Substance abuse (HCC)    crack cocaine, 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, none since   Thyroid disease    UTI (lower urinary tract infection)     Surgical History: Past Surgical History:  Procedure Laterality Date   2 LEFT TOES SURGERY  12/2014   finished only on right side   ABDOMINAL EXPOSURE N/A 03/26/2023   Procedure: ABDOMINAL EXPOSURE;  Surgeon: Gretta Lonni PARAS, MD;  [facility] OR;  Service: Vascular;  Laterality: N/A;   ABDOMINAL EXPOSURE N/A 04/02/2023   Procedure: ABDOMINAL EXPOSURE;  Surgeon: Gretta Lonni PARAS, MD;  Location[facility] OR;  Service: Vascular;  Laterality: N/A;   ANTERIOR LUMBAR FUSION N/A 03/26/2023   Procedure: REMOVAL OF POSTERIOR HARDWARE;  Surgeon: Beuford Anes, MD;  Location: [facility] OR;  Service: Orthopedics;  Laterality: N/A;   ANTERIOR LUMBAR FUSION N/A 03/26/2023   Procedure: LUMBAR FOUR - LUMBAR FIVE, LUMBAR FIVE - SACRUM ONE ANTERIOR LUMBAR INTERBODY FUSION WITH INSTRUMENTATION AND ALLOGRAFT;  Surgeon: Beuford Anes, MD;  Location: [facility] OR;  Service: Orthopedics;  Laterality: N/A;   ANTERIOR LUMBAR FUSION N/A 04/02/2023   Procedure: REVISION ANTERIOR LUMBAR FUSION L5-S1;  Surgeon: Beuford Anes, MD;  Location: [facility] OR;  Service: Orthopedics;  Laterality: N/A;   BACK SURGERY     BALLOON DILATION N/A 12/14/2015   Procedure: BALLOON DILATION;  Surgeon: Norleen Hint, MD;  Location: [facility]COPY;  Service: Endoscopy;  Laterality: N/A;   COLONOSCOPY WITH PROPOFOL  N/A 12/14/2015   Procedure: COLONOSCOPY WITH PROPOFOL ;  Surgeon: Norleen Hint, MD;  Location: [facility];  Service: Endoscopy;  Laterality: N/A;   ESOPHAGOGASTRODUODENOSCOPY (EGD) WITH PROPOFOL  N/A 12/14/2015   Procedure: ESOPHAGOGASTRODUODENOSCOPY (EGD) WITH PROPOFOL ;  Surgeon:  Norleen Hint, MD;  Location: [facility];  Service: Endoscopy;  Laterality: N/A;   FRACTURE SURGERY     JOINT REPLACEMENT     left hip   LEFT ANKLE SURGERY  12/2014   LUMBAR LAMINECTOMY/DECOMPRESSION MICRODISCECTOMY N/A 06/18/2017   Procedure: LAMINECTOMY AND FORAMINOTOMY LUMBAR FOUR - LUMBAR FIVE;  Surgeon: Gillie Duncans, MD;  Location: [facility] OR;  Service: Neurosurgery;  Laterality: N/A;  LAMINECTOMY AND FORAMINOTOMY LUMBAR 4- LUMBAR 5   LUMBAR WOUND DEBRIDEMENT N/A 03/10/2020   Procedure: Lumbar Wound Debridement;  Surgeon: Gillie Duncans, MD;  Location: [facility] OR;  Service: Neurosurgery;  Laterality: N/A;  posterior   NECK SURGERY  AS TEENAGER   STITCHES TO NECK    ORIF ANKLE FRACTURE Left 07/24/2021   Procedure: OPEN REDUCTION INTERNAL FIXATION (ORIF) ANKLE FRACTURE;  Surgeon: Beverley Evalene BIRCH, MD;  Location: [facility];  Service: Orthopedics;  Laterality: Left;   SURGERY FOR CUT ON STOMACH  TEENAGER   TOTAL HIP ARTHROPLASTY Left 05/14/2016   Procedure: TOTAL HIP ARTHROPLASTY ANTERIOR APPROACH;  Surgeon: [name], MD;  Location: [facility] OR;  Service: Orthopedics;  Laterality: Left;   TOTAL KNEE ARTHROPLASTY Right 08/31/2024   Procedure: ARTHROPLASTY, KNEE, TOTAL;  Surgeon: Chancy Evalene JONETTA, MD;  Location: [facility]S;  Service: Orthopedics;  Laterality: Right;     Medications:  Current Medications[1]  Allergies: Allergies[2]  Harve Spradley L, NP     [1]  Current Facility-Administered Medications:    acetaminophen  (TYLENOL ) tablet 1,000 mg, 1,000 mg, Oral, Q6H PRN, Mannie Pac T, DO   albuterol  (VENTOLIN  HFA) 108 (90 Base) MCG/ACT inhaler 2 puff, 2 puff, Inhalation, Q6H PRN, Mannie Pac T, DO   [START ON 12/04/2024] fluticasone  furoate-vilanterol (BREO ELLIPTA ) 200-25 MCG/ACT 1 puff, 1 puff, Inhalation, Daily, Mannie Pac T, DO   folic acid  (FOLVITE ) tablet 1 mg, 1 mg, Oral, Daily, Mannie Pac T, DO, 1 mg at 12/03/24 1500   loratadine  (CLARITIN ) tablet 10  mg, 10 mg, Oral, Daily, Mannie Pac T, DO   LORazepam  (ATIVAN ) tablet 1-4 mg, 1-4 mg, Oral, Q1H PRN **OR** LORazepam  (ATIVAN ) injection 1-4 mg, 1-4 mg, Intravenous, Q1H PRN, Mannie Pac T, DO   multivitamin with minerals tablet 1 tablet, 1 tablet, Oral, Daily, Mannie Pac T, DO, 1 tablet at 12/03/24 1500   pantoprazole  (PROTONIX ) EC tablet 40 mg, 40 mg, Oral, Daily, Mannie Pac T, DO   [START ON 12/04/2024] potassium chloride  (KLOR-CON ) CR tablet 10 mEq, 10 mEq, Oral, Daily, Mannie Pac T, DO   thiamine  (VITAMIN B1) tablet 100 mg, 100 mg, Oral, Daily, 100 mg at 12/03/24 1500 **OR** thiamine  (VITAMIN B1) injection 100 mg, 100 mg, Intravenous, Daily, Mannie Pac T, DO  Current Outpatient Medications:    ABILIFY  ASIMTUFII 720 MG/2.4ML PRSY, Inject 720 mg into the muscle every 8 (eight) weeks., Disp: , Rfl:    albuterol  (PROVENTIL ) (2.5 MG/3ML) 0.083% nebulizer solution, Take 3 mLs (2.5 mg total) by nebulization every 6 (six) hours as needed for wheezing or shortness of breath., Disp: 75 mL, Rfl: 1   albuterol  (VENTOLIN  HFA) 108 (90 Base) MCG/ACT inhaler, Inhale 2 puffs into the lungs every 6 (six) hours as needed for wheezing., Disp: 18 g, Rfl: 0   esomeprazole (NEXIUM) 40 MG capsule, Take 40 mg by mouth daily as needed (for reflux)., Disp: , Rfl:    furosemide  (LASIX ) 20 MG tablet, Take 20 mg by mouth in the morning., Disp: , Rfl:    hydrOXYzine  (VISTARIL ) 25 MG capsule, Take 25 mg by mouth in the morning and at bedtime., Disp: , Rfl:    ibuprofen  (ADVIL ) 800 MG tablet, Take 800 mg by mouth every 8 (eight) hours as needed for mild pain (pain score 1-3) or headache., Disp: , Rfl:    lithium  300 MG tablet, Take 300 mg by mouth in the morning., Disp: , Rfl:    losartan  (COZAAR ) 25 MG tablet, Take 25 mg by mouth daily., Disp: , Rfl:    methocarbamol  (ROBAXIN ) 750 MG tablet, Take 1 tablet (750 mg total) by mouth every 8 (eight) hours as needed for muscle spasms. DO NOT TAKE TIZANIDINE   WHILE TAKING THIS MEDICINE!, Disp: 21 tablet, Rfl: 0   ondansetron  (ZOFRAN -ODT) 4 MG disintegrating tablet, Take 1 tablet (4 mg total) by mouth every 8 (eight) hours as needed for nausea or vomiting., Disp: 15 tablet,  Rfl: 0   acetaminophen  (TYLENOL ) 500 MG tablet, Take 2 tablets (1,000 mg total) by mouth every 6 (six) hours as needed for mild pain (pain score 1-3) or moderate pain (pain score 4-6)., Disp: 60 tablet, Rfl: 0   ADVAIR DISKUS 250-50 MCG/ACT AEPB, Inhale 1 puff into the lungs 2 (two) times daily as needed (shortness of breath)., Disp: , Rfl:    aspirin  EC 81 MG tablet, Take 1 tablet (81 mg total) by mouth 2 (two) times daily. To prevent blood clots for 30 days after surgery. (Patient not taking: Reported on 12/03/2024), Disp: 60 tablet, Rfl: 0   budesonide -formoterol  (SYMBICORT ) 160-4.5 MCG/ACT inhaler, Inhale 2 puffs into the lungs in the morning and at bedtime., Disp: 1 each, Rfl: 0   cetirizine (ZYRTEC) 10 MG tablet, Take 10 mg by mouth daily., Disp: , Rfl:    oxyCODONE  (ROXICODONE ) 5 MG immediate release tablet, Take 1 tablet (5 mg total) by mouth every 4 (four) hours as needed for severe pain (pain score 7-10). in knee after surgery, Disp: 30 tablet, Rfl: 0   PARoxetine  (PAXIL -CR) 25 MG 24 hr tablet, Take 25 mg by mouth daily., Disp: , Rfl:    potassium chloride  (KLOR-CON ) 10 MEQ tablet, Take 10 mEq by mouth daily., Disp: , Rfl:    QUEtiapine  (SEROQUEL ) 300 MG tablet, Take 300 mg by mouth at bedtime., Disp: , Rfl:    sennosides-docusate sodium  (SENOKOT-S) 8.6-50 MG tablet, Take 2 tablets by mouth daily as needed for constipation (while taking narcotics)., Disp: 30 tablet, Rfl: 1   triamcinolone  (NASACORT  ALLERGY 24HR) 55 MCG/ACT AERO nasal inhaler, Place 2 sprays into the nose daily as needed (allergies)., Disp: , Rfl:  [2]  Allergies Allergen Reactions   Risperidone And Paliperidone Swelling and Other (See Comments)    Facial swelling   Buspirone Swelling   Shellfish Allergy  Hives, Itching, Swelling and Other (See Comments)    CRAB LEGS ONLY = fever, also   Prednisone  Swelling and Other (See Comments)    She reported history of mouth swelling (and, lips) with prednisone    Gabapentin Nausea And Vomiting, Swelling and Other (See Comments)    Speech impairment   Patient denies allergy   Meloxicam  Rash

## 2024-12-03 NOTE — ED Notes (Signed)
 Pt asleep, sitter at bedside. NAD

## 2024-12-03 NOTE — ED Notes (Signed)
 Pt to be moved to Room 30, report given to [place], RN - security aware to wand pt in TCU

## 2024-12-14 ENCOUNTER — Ambulatory Visit
